# Patient Record
Sex: Male | Born: 1940 | Race: White | Hispanic: No | Smoking: Current every day smoker
Health system: Southern US, Community
[De-identification: ages and names within clinical notes are randomized; demographics above are authoritative.]

## PROBLEM LIST (undated history)

## (undated) DIAGNOSIS — I10 Essential (primary) hypertension: Secondary | ICD-10-CM

## (undated) DIAGNOSIS — F419 Anxiety disorder, unspecified: Secondary | ICD-10-CM

## (undated) DIAGNOSIS — I739 Peripheral vascular disease, unspecified: Secondary | ICD-10-CM

## (undated) DIAGNOSIS — M40209 Unspecified kyphosis, site unspecified: Secondary | ICD-10-CM

## (undated) DIAGNOSIS — W3400XA Accidental discharge from unspecified firearms or gun, initial encounter: Secondary | ICD-10-CM

## (undated) DIAGNOSIS — I219 Acute myocardial infarction, unspecified: Secondary | ICD-10-CM

## (undated) DIAGNOSIS — Z8639 Personal history of other endocrine, nutritional and metabolic disease: Secondary | ICD-10-CM

## (undated) DIAGNOSIS — D699 Hemorrhagic condition, unspecified: Secondary | ICD-10-CM

## (undated) DIAGNOSIS — K224 Dyskinesia of esophagus: Secondary | ICD-10-CM

## (undated) DIAGNOSIS — M549 Dorsalgia, unspecified: Secondary | ICD-10-CM

## (undated) DIAGNOSIS — I4891 Unspecified atrial fibrillation: Secondary | ICD-10-CM

## (undated) DIAGNOSIS — E7800 Pure hypercholesterolemia, unspecified: Secondary | ICD-10-CM

## (undated) DIAGNOSIS — C439 Malignant melanoma of skin, unspecified: Secondary | ICD-10-CM

## (undated) DIAGNOSIS — K219 Gastro-esophageal reflux disease without esophagitis: Secondary | ICD-10-CM

## (undated) DIAGNOSIS — N529 Male erectile dysfunction, unspecified: Secondary | ICD-10-CM

## (undated) DIAGNOSIS — I2510 Atherosclerotic heart disease of native coronary artery without angina pectoris: Secondary | ICD-10-CM

## (undated) DIAGNOSIS — K5730 Diverticulosis of large intestine without perforation or abscess without bleeding: Secondary | ICD-10-CM

## (undated) DIAGNOSIS — E559 Vitamin D deficiency, unspecified: Secondary | ICD-10-CM

## (undated) DIAGNOSIS — H269 Unspecified cataract: Secondary | ICD-10-CM

## (undated) DIAGNOSIS — G8929 Other chronic pain: Secondary | ICD-10-CM

## (undated) DIAGNOSIS — K30 Functional dyspepsia: Secondary | ICD-10-CM

## (undated) DIAGNOSIS — E119 Type 2 diabetes mellitus without complications: Secondary | ICD-10-CM

## (undated) DIAGNOSIS — Z8711 Personal history of peptic ulcer disease: Secondary | ICD-10-CM

## (undated) DIAGNOSIS — N400 Enlarged prostate without lower urinary tract symptoms: Secondary | ICD-10-CM

## (undated) DIAGNOSIS — Z9119 Patient's noncompliance with other medical treatment and regimen: Secondary | ICD-10-CM

## (undated) DIAGNOSIS — D126 Benign neoplasm of colon, unspecified: Secondary | ICD-10-CM

## (undated) DIAGNOSIS — Z8719 Personal history of other diseases of the digestive system: Secondary | ICD-10-CM

## (undated) DIAGNOSIS — D72829 Elevated white blood cell count, unspecified: Secondary | ICD-10-CM

## (undated) DIAGNOSIS — Z8673 Personal history of transient ischemic attack (TIA), and cerebral infarction without residual deficits: Secondary | ICD-10-CM

## (undated) DIAGNOSIS — Z8701 Personal history of pneumonia (recurrent): Secondary | ICD-10-CM

## (undated) DIAGNOSIS — E782 Mixed hyperlipidemia: Secondary | ICD-10-CM

## (undated) DIAGNOSIS — G4730 Sleep apnea, unspecified: Secondary | ICD-10-CM

## (undated) HISTORY — DX: Personal history of transient ischemic attack (TIA), and cerebral infarction without residual deficits: Z86.73

## (undated) HISTORY — DX: Atherosclerotic heart disease of native coronary artery without angina pectoris: I25.10

## (undated) HISTORY — DX: Accidental discharge from unspecified firearms or gun, initial encounter: W34.00XA

## (undated) HISTORY — DX: Elevated white blood cell count, unspecified: D72.829

## (undated) HISTORY — DX: Unspecified atrial fibrillation: I48.91

## (undated) HISTORY — DX: Vitamin D deficiency, unspecified: E55.9

## (undated) HISTORY — DX: Type 2 diabetes mellitus without complications: E11.9

## (undated) HISTORY — DX: Essential (primary) hypertension: I10

## (undated) HISTORY — DX: Personal history of other diseases of the digestive system: Z87.19

## (undated) HISTORY — PX: MELANOMA EXCISION: SHX5266

## (undated) HISTORY — PX: LIVER SURGERY: SHX698

## (undated) HISTORY — DX: Personal history of peptic ulcer disease: Z87.11

## (undated) HISTORY — DX: Mixed hyperlipidemia: E78.2

## (undated) HISTORY — DX: Patient's noncompliance with other medical treatment and regimen: Z91.19

## (undated) HISTORY — DX: Sleep apnea, unspecified: G47.30

## (undated) HISTORY — DX: Anxiety disorder, unspecified: F41.9

## (undated) HISTORY — DX: Personal history of pneumonia (recurrent): Z87.01

## (undated) HISTORY — DX: Peripheral vascular disease, unspecified: I73.9

## (undated) HISTORY — DX: Dyskinesia of esophagus: K22.4

## (undated) HISTORY — DX: Malignant melanoma of skin, unspecified: C43.9

## (undated) HISTORY — DX: Diverticulosis of large intestine without perforation or abscess without bleeding: K57.30

## (undated) HISTORY — DX: Personal history of other endocrine, nutritional and metabolic disease: Z86.39

## (undated) HISTORY — DX: Male erectile dysfunction, unspecified: N52.9

## (undated) HISTORY — PX: SURGERY SCROTAL / TESTICULAR: SUR1316

## (undated) HISTORY — DX: Unspecified cataract: H26.9

## (undated) HISTORY — DX: Gastro-esophageal reflux disease without esophagitis: K21.9

## (undated) HISTORY — DX: Other chronic pain: G89.29

## (undated) HISTORY — PX: ANGIOPLASTY: SHX39

## (undated) HISTORY — DX: Functional dyspepsia: K30

## (undated) HISTORY — DX: Benign neoplasm of colon, unspecified: D12.6

---

## 1972-07-19 HISTORY — PX: SPLENECTOMY: SUR1306

## 1977-07-19 DIAGNOSIS — W3400XA Accidental discharge from unspecified firearms or gun, initial encounter: Secondary | ICD-10-CM

## 1977-07-19 HISTORY — DX: Accidental discharge from unspecified firearms or gun, initial encounter: W34.00XA

## 1990-07-19 HISTORY — PX: OTHER SURGICAL HISTORY: SHX169

## 1998-07-19 DIAGNOSIS — I219 Acute myocardial infarction, unspecified: Secondary | ICD-10-CM

## 1998-07-19 HISTORY — DX: Acute myocardial infarction, unspecified: I21.9

## 1998-07-19 HISTORY — PX: CORONARY ARTERY BYPASS GRAFT: SHX141

## 1998-07-25 ENCOUNTER — Ambulatory Visit (HOSPITAL_COMMUNITY): Admission: RE | Admit: 1998-07-25 | Discharge: 1998-07-25 | Payer: Self-pay | Admitting: Cardiology

## 1998-07-31 ENCOUNTER — Inpatient Hospital Stay (HOSPITAL_COMMUNITY)
Admission: RE | Admit: 1998-07-31 | Discharge: 1998-08-06 | Payer: Self-pay | Admitting: Thoracic Surgery (Cardiothoracic Vascular Surgery)

## 1998-07-31 ENCOUNTER — Encounter: Payer: Self-pay | Admitting: Thoracic Surgery (Cardiothoracic Vascular Surgery)

## 1998-08-01 ENCOUNTER — Encounter: Payer: Self-pay | Admitting: Thoracic Surgery (Cardiothoracic Vascular Surgery)

## 1998-08-02 ENCOUNTER — Encounter: Payer: Self-pay | Admitting: Thoracic Surgery (Cardiothoracic Vascular Surgery)

## 1998-08-04 ENCOUNTER — Encounter: Payer: Self-pay | Admitting: Cardiothoracic Surgery

## 1998-08-06 ENCOUNTER — Encounter: Payer: Self-pay | Admitting: Thoracic Surgery (Cardiothoracic Vascular Surgery)

## 1999-01-19 ENCOUNTER — Other Ambulatory Visit: Admission: RE | Admit: 1999-01-19 | Discharge: 1999-01-19 | Payer: Self-pay | Admitting: Gastroenterology

## 1999-01-19 ENCOUNTER — Encounter (INDEPENDENT_AMBULATORY_CARE_PROVIDER_SITE_OTHER): Payer: Self-pay | Admitting: Specialist

## 1999-10-06 ENCOUNTER — Ambulatory Visit (HOSPITAL_COMMUNITY): Admission: RE | Admit: 1999-10-06 | Discharge: 1999-10-06 | Payer: Self-pay | Admitting: Cardiology

## 2001-05-19 ENCOUNTER — Emergency Department (HOSPITAL_COMMUNITY): Admission: EM | Admit: 2001-05-19 | Discharge: 2001-05-19 | Payer: Self-pay | Admitting: Emergency Medicine

## 2002-07-19 HISTORY — PX: CHOLECYSTECTOMY OPEN: SUR202

## 2002-09-03 ENCOUNTER — Encounter: Payer: Self-pay | Admitting: Surgery

## 2002-09-06 ENCOUNTER — Encounter: Payer: Self-pay | Admitting: Surgery

## 2002-09-06 ENCOUNTER — Encounter (INDEPENDENT_AMBULATORY_CARE_PROVIDER_SITE_OTHER): Payer: Self-pay

## 2002-09-06 ENCOUNTER — Inpatient Hospital Stay (HOSPITAL_COMMUNITY): Admission: RE | Admit: 2002-09-06 | Discharge: 2002-09-08 | Payer: Self-pay | Admitting: Surgery

## 2002-11-16 ENCOUNTER — Encounter: Admission: RE | Admit: 2002-11-16 | Discharge: 2002-11-16 | Payer: Self-pay | Admitting: Family Medicine

## 2002-11-16 ENCOUNTER — Encounter: Payer: Self-pay | Admitting: Family Medicine

## 2002-12-05 ENCOUNTER — Encounter: Payer: Self-pay | Admitting: Family Medicine

## 2002-12-05 ENCOUNTER — Encounter: Admission: RE | Admit: 2002-12-05 | Discharge: 2002-12-05 | Payer: Self-pay | Admitting: Family Medicine

## 2003-05-21 ENCOUNTER — Ambulatory Visit (HOSPITAL_COMMUNITY): Admission: RE | Admit: 2003-05-21 | Discharge: 2003-05-21 | Payer: Self-pay | Admitting: Cardiology

## 2004-01-24 ENCOUNTER — Encounter: Admission: RE | Admit: 2004-01-24 | Discharge: 2004-01-24 | Payer: Self-pay | Admitting: Obstetrics and Gynecology

## 2005-05-21 ENCOUNTER — Encounter: Admission: RE | Admit: 2005-05-21 | Discharge: 2005-05-21 | Payer: Self-pay | Admitting: Family Medicine

## 2005-09-22 ENCOUNTER — Ambulatory Visit: Payer: Self-pay | Admitting: Gastroenterology

## 2005-10-06 ENCOUNTER — Ambulatory Visit: Payer: Self-pay | Admitting: Gastroenterology

## 2005-10-14 ENCOUNTER — Encounter: Admission: RE | Admit: 2005-10-14 | Discharge: 2005-10-14 | Payer: Self-pay | Admitting: Cardiology

## 2005-12-20 ENCOUNTER — Inpatient Hospital Stay (HOSPITAL_COMMUNITY): Admission: EM | Admit: 2005-12-20 | Discharge: 2005-12-21 | Payer: Self-pay | Admitting: Emergency Medicine

## 2006-02-10 ENCOUNTER — Emergency Department (HOSPITAL_COMMUNITY): Admission: EM | Admit: 2006-02-10 | Discharge: 2006-02-10 | Payer: Self-pay | Admitting: Emergency Medicine

## 2006-06-02 ENCOUNTER — Encounter: Admission: RE | Admit: 2006-06-02 | Discharge: 2006-06-02 | Payer: Self-pay | Admitting: Specialist

## 2006-07-06 ENCOUNTER — Encounter: Admission: RE | Admit: 2006-07-06 | Discharge: 2006-07-06 | Payer: Self-pay | Admitting: Specialist

## 2006-12-02 ENCOUNTER — Emergency Department (HOSPITAL_COMMUNITY): Admission: EM | Admit: 2006-12-02 | Discharge: 2006-12-03 | Payer: Self-pay | Admitting: Emergency Medicine

## 2007-02-20 ENCOUNTER — Encounter: Admission: RE | Admit: 2007-02-20 | Discharge: 2007-02-20 | Payer: Self-pay | Admitting: Specialist

## 2007-10-13 ENCOUNTER — Ambulatory Visit: Payer: Self-pay | Admitting: Vascular Surgery

## 2008-05-24 ENCOUNTER — Ambulatory Visit (HOSPITAL_COMMUNITY): Payer: Self-pay | Admitting: Psychiatry

## 2008-07-19 LAB — HM DIABETES EYE EXAM

## 2008-07-29 ENCOUNTER — Ambulatory Visit (HOSPITAL_COMMUNITY): Payer: Self-pay | Admitting: Psychiatry

## 2008-09-25 ENCOUNTER — Ambulatory Visit (HOSPITAL_COMMUNITY): Payer: Self-pay | Admitting: Psychiatry

## 2009-01-01 ENCOUNTER — Ambulatory Visit (HOSPITAL_COMMUNITY): Payer: Self-pay | Admitting: Psychiatry

## 2010-05-05 ENCOUNTER — Encounter: Admission: RE | Admit: 2010-05-05 | Discharge: 2010-05-05 | Payer: Self-pay | Admitting: Family Medicine

## 2010-08-09 ENCOUNTER — Encounter: Payer: Self-pay | Admitting: Specialist

## 2010-08-24 LAB — HM DIABETES FOOT EXAM

## 2010-10-04 ENCOUNTER — Encounter: Payer: Self-pay | Admitting: *Deleted

## 2010-10-04 DIAGNOSIS — C439 Malignant melanoma of skin, unspecified: Secondary | ICD-10-CM

## 2010-10-04 DIAGNOSIS — K432 Incisional hernia without obstruction or gangrene: Secondary | ICD-10-CM

## 2010-10-04 DIAGNOSIS — I2510 Atherosclerotic heart disease of native coronary artery without angina pectoris: Secondary | ICD-10-CM

## 2010-10-04 DIAGNOSIS — G4730 Sleep apnea, unspecified: Secondary | ICD-10-CM

## 2010-10-04 DIAGNOSIS — R42 Dizziness and giddiness: Secondary | ICD-10-CM

## 2010-10-04 DIAGNOSIS — E1151 Type 2 diabetes mellitus with diabetic peripheral angiopathy without gangrene: Secondary | ICD-10-CM | POA: Insufficient documentation

## 2010-10-04 DIAGNOSIS — N4889 Other specified disorders of penis: Secondary | ICD-10-CM

## 2010-10-04 DIAGNOSIS — F17200 Nicotine dependence, unspecified, uncomplicated: Secondary | ICD-10-CM

## 2010-10-04 DIAGNOSIS — I10 Essential (primary) hypertension: Secondary | ICD-10-CM

## 2010-10-04 DIAGNOSIS — M47817 Spondylosis without myelopathy or radiculopathy, lumbosacral region: Secondary | ICD-10-CM

## 2010-10-04 DIAGNOSIS — B9681 Helicobacter pylori [H. pylori] as the cause of diseases classified elsewhere: Secondary | ICD-10-CM

## 2010-10-04 DIAGNOSIS — D126 Benign neoplasm of colon, unspecified: Secondary | ICD-10-CM

## 2010-10-04 DIAGNOSIS — N529 Male erectile dysfunction, unspecified: Secondary | ICD-10-CM

## 2010-10-04 DIAGNOSIS — E291 Testicular hypofunction: Secondary | ICD-10-CM

## 2010-10-04 DIAGNOSIS — K5730 Diverticulosis of large intestine without perforation or abscess without bleeding: Secondary | ICD-10-CM

## 2010-10-04 DIAGNOSIS — R51 Headache: Secondary | ICD-10-CM

## 2010-10-04 DIAGNOSIS — I739 Peripheral vascular disease, unspecified: Secondary | ICD-10-CM

## 2010-10-04 DIAGNOSIS — M47812 Spondylosis without myelopathy or radiculopathy, cervical region: Secondary | ICD-10-CM
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
## 2010-12-04 NOTE — Discharge Summary (Signed)
NAME:  Dustin Dominguez, Dustin Dominguez                       ACCOUNT NO.:  [id number]   MEDICAL RECORD NO.:  [id number]                   PATIENT TYPE:  INP   LOCATION:  5733                                 FACILITY:  [facility]   PHYSICIAN:  Sandria Bales. Ezzard Standing, M.D.               DATE OF BIRTH:  Jan 31, 1941   DATE OF ADMISSION:  09/06/2002  DATE OF DISCHARGE:  09/08/2002                                 DISCHARGE SUMMARY   DISCHARGE DIAGNOSES:  1. Chronic cholecystitis with cholelithiasis.  2. Coronary artery disease, followed by Dr. Delfin Edis.  3. Sleep apnea but the patient has refused CPAP.  4. Heavy smoking.  5. Type 2 diabetes mellitus.  6. Obesity.  7. Multiple prior abdominal operations.   OPERATION:  Open cholecystectomy and intraoperative cholangiogram and lysis  of adhesions on September 06, 2002.   HISTORY OF PRESENT ILLNESS:  The patient is a 70 year old male who has  multiple medical problems including multiple prior abdominal operations. He  presented with a diagnosis of gallstones and was seen by Dr. Sheryn Bison. He has undergone both an upper endoscopy and colonoscopy in  October 2003. The patient has his symptoms about once a month that occur  probably for 4 to 5 hours in which he points to his mid epigastrium and  periumbilical area. He was worried at one time about having ulcer disease,  but there is no evidence of ulcer disease on upper endoscopy, and though  his symptoms are not typical, it is felt to be possible due to gallbladder  disease. Again the patient is obese and has significant coronary artery  disease, sleep apnea and type 2 diabetes mellitus.   HOSPITAL COURSE:  The patient is brought to the hospital after a full  discussion and indications of potential complications of the operation.  Because of his prior multiple abdominal surgeries, it is thought to be very  unlikely that his surgery would be able to be done laparoscopically.   The patient was taken to  the operating room where I attempted a laparoscopic  but quickly converted to an open cholecystectomy. He also had a lot of  adhesions around his liver and right upper quadrant from his prior right  subcostal and adrenal surgery. However, the surgery went fairly well.   On postoperative day #1 he had minimal complaints of pain. He did have a low  grade temperature of 100.7, felt to be primary due to atelectasis. His blood  pressure was stable at 146/60, but I restarted his antihypertensive  medications. His diabetes was under good control with his blood sugars at  135. We had an epidural in him to help him with his breathing which was  removed.   On postoperative day #2, he was doing well and ready for discharge. His  final pathology revealed chronic cholecystitis with cholelithiasis. His  discharge condition was good.   DISCHARGE MEDICATIONS:  1. Percocet for  pain.  2. He was to resume his home medications he was on before coming to the     hospital.   DISCHARGE INSTRUCTIONS:  He was to do no driving for 3 to 4 days,  no heavy  lifting for 3 weeks. He was to be on a low fat diabetic diet. He could  shower.   FOLLOW UP:  He was to see me in 7 to 10 days for follow up and possible  staple removal.                                               Sandria Bales. Ezzard Standing, M.D.    DHN/MEDQ  D:  10/02/2002  T:  10/03/2002  Job:  [id number]   cc:   Ernestina Penna, M.D.  [address] [place]  [place] [address]  Fax: [phone]   Colleen Can. Deborah Chalk, M.D.  [address], [address]  [place]  [place] [address]  Fax: [phone]   Vania Rea. Jarold Motto, M.D. [facility]

## 2010-12-04 NOTE — Cardiovascular Report (Signed)
[place]  Patient:    Dustin Dominguez, Dustin Dominguez                    MRN: [id number] Proc. Date: 10/06/99 Adm. Date:  [id number] Attending:  Eleanora Neighbor                        Cardiac Catheterization  HISTORY:  Dustin Dominguez had previous coronary artery bypass grafting in January 2000 and presents with recurrent anginal-like chest pain.  He is referred for catheterization.  PROCEDURE:  Left heart catheterization with selective coronary angiography, left ventricular angiography with saphenous vein graft angiography and angiography of the left internal mammary artery.  TYPE AND SITE OF ENTRY:  Percutaneous right femoral artery (no Perclose).  CONTRAST MATERIAL:  Omnipaque.  MEDICATIONS GIVEN PRIOR TO PROCEDURE:  Valium 10 mg p.o.  MEDICATIONS GIVEN DURING THE PROCEDURE:  Versed 2 mg IV.  CATHETERS:  A 6-French four-curved Judkins right and left coronary catheter, 6-French pigtail ventriculographic catheter.  COMMENTS:  The patient tolerated the procedure well.  HEMODYNAMIC DATA:  Aortic pressure was 129/72.  LV was 128/20.  There was no aortic valve gradient noted on pullback.  ANGIOGRAPHIC DATA:  The left ventricular angiogram was performed in the RAO position.  Overall cardiac size and silhouette are normal.  Global ejection fraction would be 60%.  There s no abnormal wall motion.  There is no mitral regurgitation, intracardiac calcification, or intracavitary filling defects.  1. The left internal mammary artery has nice graft and inserts into the left    anterior descending.  Distal to the insertion site, there is a 60% to perhaps    70% narrowing as the vessel crosses the apex, and there is diffuse disease    distally; however, the runoff is a relatively small area. 2. The saphenous vein graft to the obtuse marginal #1 and obtuse marginal #2 is    widely patent with a nice insertion site.  Good distal runoff.  There is  somewhat diffuse disease both in the proximal segment of the first obtuse    marginal and in the proximal section of the second obtuse marginal, but it does    not appear to be obstructive in nature. 3. The saphenous vein graft to the diagonal vessel is widely patent with a nice    insertion and good distal runoff. 4. The right coronary artery is a small system.  It remains the dominant vessel  but is small and free of significant disease. 5. The left coronary system    a. The left main coronary artery tapers approximately 50%.    b. The left circumflex is totally occluded and fills by bypass grafts.  It is       diffusely diseased throughout its proximal section.    c. The left anterior descending artery is totally occluded at the level of the       diagonal vessel.  OVERALL IMPRESSION: 1. Normal left ventricular function. 2. Totally occluded left circumflex and left anterior descending arteries with o    significant disease in a small right coronary artery. 3. Persistent patency of the left internal mammary artery graft to the LAD,    saphenous vein graft to the diagonal, and saphenous vein graft to the obtuse  marginal #1 and obtuse marginal #2.  DISCUSSION:  Basically, it would appear that the Techno grafts placed in Dustin Dominguez operation are quite satisfactory.  There is some  distal disease in the  left anterior descending artery, and then there is scattered distal disease in he retrograde filling of occluded coronaries off of the left system which have potential for ischemia but are clearly not bypassable.  We will try to modify Dustin Dominguez cardiovascular risk factors. DD:  10/06/99 TD:  10/06/99 Job: [id number]

## 2010-12-04 NOTE — Consult Note (Signed)
[facility]  Patient:    Dustin Dominguez Visit Number: [id number] MRN: [id number]          Service Type: EMS Location: ED Attending Physician:  Dustin Dominguez Dictated by:   Dustin Dominguez, M.D. Proc. Date: 05/19/01 Admit Date:  05/19/2001 Discharge Date: 05/19/2001   CC:         Dustin Dominguez, M.D., [place], [place]   Consultation Report  CHIEF COMPLAINT:  Right scrotal pain.  HISTORY:  This is a 70 year old white male who is seen today at the request of Dr. Monica Dominguez, who sent him down with question of whether he had an incarcerated hernia or orchitis.  Patient presented with a three-day history of increasing right scrotal pain.  He denies any nausea or vomiting.  He has previously had a sliding left inguinal hernia, which he has had for some time. He has recently had an upper respiratory tract infection and has had some coughing.  PHYSICAL EXAMINATION:  GU:  His right testicle seems somewhat enlarged and exquisitely tender to palpation.  Above that, I can palpate the cord structures and they are, although tender, not bulging and not distended.  With cough and Valsalva, I do not feel any bulge in the scrotum or in the inguinal canal.  Palpating the internal ring from above, it feels normal.  Therefore, this is consistent with an orchitis.  I discussed this with him.  He does not have a urologist.  I will go ahead and start him on ciprofloxacin 500 mg p.o. b.i.d. and plan to treat him for 10 days.  Also, I will give him something for pain.  IMPRESSION:  Orchitis, right testicle.  PLAN:  Patient will call our office next week for scheduled followup.  Plan antibiotic treatment with Percocet for pain. Dictated by:   Dustin Dominguez, M.D. Attending Physician:  Dustin Dominguez DD:  05/19/01 TD:  05/21/01 Job: [id number]

## 2010-12-04 NOTE — Op Note (Signed)
   NAME:  Dustin Dominguez, Dustin Dominguez                       ACCOUNT NO.:  [id number]   MEDICAL RECORD NO.:  [id number]                   PATIENT TYPE:  OIB   LOCATION:  2550                                 FACILITY:  [facility]   PHYSICIAN:  Quita Skye. Krista Blue, M.D.               DATE OF BIRTH:  03/28/1941   DATE OF PROCEDURE:  09/06/2002  DATE OF DISCHARGE:                                 OPERATIVE REPORT   PROCEDURE:  Epidural catheter placement.   INDICATIONS FOR PROCEDURE:  The patient is a 70 year old white male with  coronary artery disease, obesity, and severe sleep apnea who presents to the  operating room for laparoscopic cholecystectomy versus open cholecystectomy.  I discussed the patient's postoperative pain control options if the surgery  would go to an open cholecystectomy.  He understood the risks and benefits  of an epidural catheter, initially declining this and after further thought  accepting the risks and benefits and wished to have the epidural catheter  placed should the surgery be open.  I discussed this with Dr. Ezzard Standing and he  agreed to the procedure.   DESCRIPTION OF PROCEDURE:  Following the patient's surgery, the patient was  placed in the right lateral decubitus position.  The lower thoracic spine  was sterilely prepped and draped.  A 17-gauge Tuohy needle was advanced  initially midline unsuccessfully and then paramedian successfully at about  the T10-T11 interspace.  The epidural needle did not aspirate blood or CSF  and epidural catheter was inserted 4 cm into the epidural space.  The needle  was removed and the catheter was secured to the patient's back.  The  catheter did not aspirate blood or CSF and a 5-mL test dose of 1% lidocaine  with epinephrine was negative.  Following a negative test dose, 2 mL of  fentanyl and eight more mL's of lidocaine were given.  This was done  incrementally and the patient was then returned supine, extubated, brought  to the PACU where  he was placed on a Marcaine/fentanyl infusion.  The  patient will be followed by the acute pain service until the catheter is  removed.                                               Quita Skye Krista Blue, M.D.    JDS/MEDQ  D:  09/06/2002  T:  09/06/2002  Job:  [id number]

## 2010-12-04 NOTE — Cardiovascular Report (Signed)
NAME:  Dustin Dominguez, Dustin Dominguez                       ACCOUNT NO.:  [id number]   MEDICAL RECORD NO.:  [id number]                   PATIENT TYPE:  OIB   LOCATION:  2857                                 FACILITY:  [facility]   PHYSICIAN:  Colleen Can. Deborah Chalk, M.D.            DATE OF BIRTH:  11-Nov-1940   DATE OF PROCEDURE:  05/21/2003  DATE OF DISCHARGE:                              CARDIAC CATHETERIZATION   HISTORY:  Dustin Dominguez had previous coronary artery bypass grafting.  He  presents with progressive substernal chest discomfort and is referred for  catheterization.   PROCEDURE:  Left heart catheterization with selective coronary angiography,  left ventricular angiography, saphenous vein graft angiography x2, and  angiography of the left internal mammary artery.   TYPE AND SITE OF ENTRY:  Percutaneous, right femoral artery.   CATHETERS:  6-French 4-curved Judkins right and left coronary catheters; 6-  French pigtail ventriculographic catheter.   CONTRAST MATERIAL:  Omnipaque.   MEDICATIONS GIVEN PRIOR TO THE PROCEDURE:  Valium 10 mg p.o., Percocet.   MEDICATIONS GIVEN DURING THE PROCEDURE:  Fentanyl 50 mcg IV.   COMMENTS:  The patient tolerated the procedure well.   HEMODYNAMIC DATA:  1. The aortic pressure was 164/77.  2. LV was 173/21.  3. There was no aortic valve gradient noted on pullback.   ANGIOGRAPHIC DATA:  1. Left main coronary artery had 30-50% distal narrowing.  2. Left circumflex had severe diffuse disease in its proximal portions.     There was bidirectional flow in both of the marginal vessels.  There was     a smaller continuation branch in the AV groove.  3. Left anterior descending had diffuse disease proximally.  Just prior to a     larger second diagonal vessel there was an 80% stenosis.  A first     diagonal vessel was free of significant obstructive disease.  In the left     anterior descending distal to the second diagonal there was a 90%     stenosis but  there was bidirectional flow distally from the left internal     mammary graft.  4. Right coronary artery is a small, dominant vessel.  It has     irregularities, but no significant focal obstructive disease.  5. Saphenous vein graft to the diagonal vessel is widely patent with a nice     insertion and good distal runoff.  6. Saphenous vein graft to obtuse marginal 1 and obtuse marginal 2 in a     sequential manner is widely patent with a nice insertion and good distal     runoff.  7. Left internal mammary artery graft to the LAD is widely patent with a     nice insertion and good distal runoff.  The insertion site is in the more     distal portions of the left anterior descending and there is some degree     of retrograde filling  of the left anterior descending in a bidirectional     manner.   LEFT VENTRICULOGRAM:  Left ventricular angiogram was performed in the RAO  position.  Overall cardiac size and silhouette are normal.  The global  ejection fraction is 50-55%.  There is mild anterior hypokinesia.   ABDOMINAL AORTOGRAM:  The abdominal aortogram demonstrates patent renal  arteries bilaterally.   OVERALL IMPRESSION:  1. Reasonably well preserved global left ventricular function with mild     anterior hypokinesia.  2. Severe stenosis in the left anterior descending and left circumflex with     mild to minimal disease in the right coronary artery.  3. Patent left internal mammary artery graft to distal left anterior     descending, patent saphenous vein graft to the second diagonal, patent     sequential saphenous vein graft to first and second obtuse marginal     vessels.   DISCUSSION:  It is felt that Dustin Dominguez can be managed medically at this  point in time.  Perclose was not performed because the insertion site was  just distal to the bifurcation of the common femoral.                                               Colleen Can. Deborah Chalk, M.D.    SNT/MEDQ  D:  05/21/2003   T:  05/21/2003  Job:  [id number]
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
## 2011-01-27 ENCOUNTER — Telehealth: Payer: Self-pay | Admitting: Cardiology

## 2011-01-27 ENCOUNTER — Encounter: Payer: Self-pay | Admitting: Cardiology

## 2011-01-27 ENCOUNTER — Ambulatory Visit (INDEPENDENT_AMBULATORY_CARE_PROVIDER_SITE_OTHER): Payer: BC Managed Care – PPO | Admitting: Cardiology

## 2011-01-27 VITALS — BP 108/66 | HR 69 | Ht 72.0 in | Wt 224.0 lb

## 2011-01-27 DIAGNOSIS — I10 Essential (primary) hypertension: Secondary | ICD-10-CM

## 2011-01-27 DIAGNOSIS — F17200 Nicotine dependence, unspecified, uncomplicated: Secondary | ICD-10-CM

## 2011-01-27 DIAGNOSIS — I2510 Atherosclerotic heart disease of native coronary artery without angina pectoris: Secondary | ICD-10-CM

## 2011-01-27 DIAGNOSIS — E782 Mixed hyperlipidemia: Secondary | ICD-10-CM

## 2011-01-27 DIAGNOSIS — E785 Hyperlipidemia, unspecified: Secondary | ICD-10-CM | POA: Insufficient documentation

## 2011-01-27 NOTE — Telephone Encounter (Deleted)
[phone] STRESS, NUC from 2011

## 2011-01-27 NOTE — Assessment & Plan Note (Signed)
Will request most recent lipid panel from Dr. Christell Constant for review.

## 2011-01-27 NOTE — Assessment & Plan Note (Signed)
Patient states that he has had some difficulties with fluctuating blood pressures and significant hypertension over the last several months, although medication adjustments have reportedly been made, and his blood pressure is quite well controlled today.

## 2011-01-27 NOTE — Patient Instructions (Signed)
Your physician wants you to follow-up in: 6 months. You will receive a reminder letter in the mail one-two months in advance. If you don't receive a letter, please call our office to schedule the follow-up appointment. Your physician recommends that you continue on your current medications as directed. Please refer to the Current Medication list given to you today. Your physician discussed the hazards of tobacco use. Tobacco use cessation is recommended and techniques and options to help you quit were discussed. We have requested your recent cholesterol labs from Dr. Christell Constant. We will notify you if any changes are needed after review by Dr. Diona Browner.
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
## 2011-01-27 NOTE — Assessment & Plan Note (Signed)
Symptomatically stable on medical therapy status post prior coronary artery bypass grafting. Myoview from last year showed good myocardial perfusion with normal LVEF. ECG is stable. At this point plan to continue medical therapy and observation.

## 2011-01-27 NOTE — Assessment & Plan Note (Signed)
Long-standing history. Smoking cessation recommended.

## 2011-02-15 NOTE — Telephone Encounter (Signed)
No note required for this encounter.

## 2011-05-10 ENCOUNTER — Ambulatory Visit: Payer: BC Managed Care – PPO | Admitting: Physical Therapy

## 2011-05-12 ENCOUNTER — Ambulatory Visit: Payer: Medicare Other | Attending: Family Medicine | Admitting: Physical Therapy

## 2011-05-12 DIAGNOSIS — M545 Low back pain, unspecified: Secondary | ICD-10-CM | POA: Insufficient documentation

## 2011-05-12 DIAGNOSIS — R5381 Other malaise: Secondary | ICD-10-CM | POA: Insufficient documentation

## 2011-05-12 DIAGNOSIS — R293 Abnormal posture: Secondary | ICD-10-CM | POA: Insufficient documentation

## 2011-05-12 DIAGNOSIS — IMO0001 Reserved for inherently not codable concepts without codable children: Secondary | ICD-10-CM | POA: Insufficient documentation

## 2011-05-17 ENCOUNTER — Ambulatory Visit (AMBULATORY_SURGERY_CENTER): Payer: Medicare Other | Admitting: *Deleted

## 2011-05-17 VITALS — Ht 72.0 in | Wt 229.6 lb

## 2011-05-17 DIAGNOSIS — Z86010 Personal history of colonic polyps: Secondary | ICD-10-CM

## 2011-05-17 MED ORDER — PEG-KCL-NACL-NASULF-NA ASC-C 100 G PO SOLR: ORAL | Status: DC

## 2011-05-17 NOTE — Progress Notes (Signed)
PATIENT STATES HE HAD NO PROBLEMS WITH SEDATION DURING LAST COLONOSCOPY IN 2007, HE STATES HE WAS ON PAIN MEDS & ANXIETY MEDS ALSO AT THAT TIME AND HE HAD SLEEP APNEA FOR THE LAST 15 YEARS.

## 2011-05-18 ENCOUNTER — Ambulatory Visit: Payer: Medicare Other | Admitting: *Deleted

## 2011-05-20 ENCOUNTER — Encounter: Payer: BC Managed Care – PPO | Admitting: *Deleted

## 2011-05-31 ENCOUNTER — Other Ambulatory Visit: Payer: BC Managed Care – PPO | Admitting: Gastroenterology

## 2011-06-18 ENCOUNTER — Other Ambulatory Visit: Payer: Self-pay | Admitting: Gastroenterology

## 2011-09-15 ENCOUNTER — Ambulatory Visit: Payer: Medicare Other | Admitting: Cardiology

## 2011-10-21 ENCOUNTER — Ambulatory Visit (INDEPENDENT_AMBULATORY_CARE_PROVIDER_SITE_OTHER): Payer: Medicare Other | Admitting: Cardiology

## 2011-10-21 ENCOUNTER — Telehealth: Payer: Self-pay | Admitting: *Deleted

## 2011-10-21 ENCOUNTER — Encounter: Payer: Self-pay | Admitting: *Deleted

## 2011-10-21 ENCOUNTER — Encounter: Payer: Self-pay | Admitting: Cardiology

## 2011-10-21 VITALS — BP 145/81 | HR 60 | Ht 72.0 in | Wt 218.0 lb

## 2011-10-21 DIAGNOSIS — I2510 Atherosclerotic heart disease of native coronary artery without angina pectoris: Secondary | ICD-10-CM

## 2011-10-21 DIAGNOSIS — R072 Precordial pain: Secondary | ICD-10-CM

## 2011-10-21 DIAGNOSIS — F17200 Nicotine dependence, unspecified, uncomplicated: Secondary | ICD-10-CM

## 2011-10-21 DIAGNOSIS — E782 Mixed hyperlipidemia: Secondary | ICD-10-CM

## 2011-10-21 DIAGNOSIS — I10 Essential (primary) hypertension: Secondary | ICD-10-CM

## 2011-10-21 MED ORDER — ISOSORBIDE MONONITRATE ER 30 MG PO TB24: 30.0000 mg | ORAL_TABLET | Freq: Every day | ORAL | Status: DC

## 2011-10-21 NOTE — Assessment & Plan Note (Signed)
Continue current regimen

## 2011-10-21 NOTE — Telephone Encounter (Signed)
[employer] Auth # [id number] exp 11/19/11 [employer] does not require precert per this patient plan. Copy of this is scanned into the patient's documents/release of information.

## 2011-10-21 NOTE — Assessment & Plan Note (Signed)
Smoking cessation discussed. Patient has not been able to quit over time.
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
## 2011-10-21 NOTE — Patient Instructions (Addendum)
Your physician wants you to follow-up in: 6 months. You will receive a reminder letter in the mail one-two months in advance. If you don't receive a letter, please call our office to schedule the follow-up appointment. Start Imdur (isosorbide) 30 mg daily. Your physician has requested that you have a lexiscan myoview. For further information please visit [email or web address] Please follow instruction sheet, as given.  If the results of your test are normal or stable, you will receive a letter. If they are abnormal, the nurse will contact you by phone.

## 2011-10-21 NOTE — Assessment & Plan Note (Signed)
Multivessel disease status post previous CABG in 2000. He reports progressive chest pain symptoms in the last 6 months. Followup ECG is nonspecific. Plan is to add Imdur 30 mg daily to current regimen and schedule a followup Lexiscan Myoview for ischemic assessment. If this is low risk, and he has symptom improvement with medication adjustments, we will continue observation. Otherwise plan to bring him back sooner and discussed further evaluation.

## 2011-10-21 NOTE — Telephone Encounter (Signed)
lexiscan myoview scheduled for 10-25-2011 @ [facility] Checking percert

## 2011-10-21 NOTE — Assessment & Plan Note (Signed)
Keep followup with Dr. Moore. 

## 2011-11-08 ENCOUNTER — Encounter: Payer: Self-pay | Admitting: Gastroenterology

## 2011-11-22 ENCOUNTER — Encounter: Payer: Self-pay | Admitting: *Deleted

## 2011-11-24 ENCOUNTER — Ambulatory Visit (AMBULATORY_SURGERY_CENTER): Payer: Medicare Other | Admitting: *Deleted

## 2011-11-24 VITALS — Ht 72.0 in | Wt 213.7 lb

## 2011-11-24 DIAGNOSIS — Z1211 Encounter for screening for malignant neoplasm of colon: Secondary | ICD-10-CM

## 2011-12-08 ENCOUNTER — Encounter: Payer: Self-pay | Admitting: Gastroenterology

## 2011-12-08 ENCOUNTER — Ambulatory Visit (AMBULATORY_SURGERY_CENTER): Payer: Medicare Other | Admitting: Gastroenterology

## 2011-12-08 VITALS — BP 126/65 | HR 58 | Temp 97.6°F | Resp 20 | Ht 72.0 in | Wt 213.0 lb

## 2011-12-08 DIAGNOSIS — Z1211 Encounter for screening for malignant neoplasm of colon: Secondary | ICD-10-CM

## 2011-12-08 DIAGNOSIS — K5730 Diverticulosis of large intestine without perforation or abscess without bleeding: Secondary | ICD-10-CM

## 2011-12-08 DIAGNOSIS — D126 Benign neoplasm of colon, unspecified: Secondary | ICD-10-CM

## 2011-12-08 LAB — GLUCOSE, CAPILLARY
Glucose-Capillary: 104 mg/dL — ABNORMAL HIGH (ref 70–99)
Glucose-Capillary: 70 mg/dL (ref 70–99)
Glucose-Capillary: 94 mg/dL (ref 70–99)

## 2011-12-08 MED ORDER — SODIUM CHLORIDE 0.9 % IV SOLN: 500.0000 mL | INTRAVENOUS | Status: DC

## 2011-12-08 NOTE — Patient Instructions (Signed)
YOU HAD AN ENDOSCOPIC PROCEDURE TODAY AT THE [facility]: Refer to the procedure report that was given to you for any specific questions about what was found during the examination.  If the procedure report does not answer your questions, please call your gastroenterologist to clarify.  If you requested that your care partner not be given the details of your procedure findings, then the procedure report has been included in a sealed envelope for you to review at your convenience later.  YOU SHOULD EXPECT: Some feelings of bloating in the abdomen. Passage of more gas than usual.  Walking can help get rid of the air that was put into your GI tract during the procedure and reduce the bloating. If you had a lower endoscopy (such as a colonoscopy or flexible sigmoidoscopy) you may notice spotting of blood in your stool or on the toilet paper. If you underwent a bowel prep for your procedure, then you may not have a normal bowel movement for a few days.  DIET: Your first meal following the procedure should be a light meal and then it is ok to progress to your normal diet.  A half-sandwich or bowl of soup is an example of a good first meal.  Heavy or fried foods are harder to digest and may make you feel nauseous or bloated.  Likewise meals heavy in dairy and vegetables can cause extra gas to form and this can also increase the bloating.  Drink plenty of fluids but you should avoid alcoholic beverages for 24 hours.  ACTIVITY: Your care partner should take you home directly after the procedure.  You should plan to take it easy, moving slowly for the rest of the day.  You can resume normal activity the day after the procedure however you should NOT DRIVE or use heavy machinery for 24 hours (because of the sedation medicines used during the test).    SYMPTOMS TO REPORT IMMEDIATELY: A gastroenterologist can be reached at any hour.  During normal business hours, 8:30 AM to 5:00 PM Monday through Friday,  call [phone].  After hours and on weekends, please call the GI answering service at [phone] who will take a message and have the physician on call contact you.   Following lower endoscopy (colonoscopy or flexible sigmoidoscopy):  Excessive amounts of blood in the stool  Significant tenderness or worsening of abdominal pains  Swelling of the abdomen that is new, acute  Fever of 100F or higher   FOLLOW UP: If any biopsies were taken you will be contacted by phone or by letter within the next 1-3 weeks.  Call your gastroenterologist if you have not heard about the biopsies in 3 weeks.  Our staff will call the home number listed on your records the next business day following your procedure to check on you and address any questions or concerns that you may have at that time regarding the information given to you following your procedure. This is a courtesy call and so if there is no answer at the home number and we have not heard from you through the emergency physician on call, we will assume that you have returned to your regular daily activities without incident.   Resume medications.metamucil or benefiber.Information given on diverticulosis and high fiber diet,polyps with discharge instructions. SIGNATURES/CONFIDENTIALITY: You and/or your care partner have signed paperwork which will be entered into your electronic medical record.  These signatures attest to the fact that that the information above on your After  Visit Summary has been reviewed and is understood.  Full responsibility of the confidentiality of this discharge information lies with you and/or your care-partner.

## 2011-12-08 NOTE — Progress Notes (Signed)
Blood sugar repeat CBG 70, 4 oz apple juice given in addition to 3 graham cracker halves and peanut butter. IVF changed to D5W, will recheck CBG in 15 minutes.

## 2011-12-08 NOTE — Op Note (Signed)
[facility] [address] [place], [place]  [address]  COLONOSCOPY PROCEDURE REPORT  PATIENT:  Dustin, Dominguez  MR#:  [id number] BIRTHDATE:  Mar 29, 1941, 71 yrs. old  GENDER:  male ENDOSCOPIST:  Vania Rea. Jarold Motto, MD, [facility] REF. BY: PROCEDURE DATE:  12/08/2011 PROCEDURE:  Colonoscopy with snare polypectomy ASA CLASS:  Class III INDICATIONS:  history of pre-cancerous (adenomatous) colon polyps  MEDICATIONS:   propofol (Diprivan) 200 mg IV  DESCRIPTION OF PROCEDURE:   After the risks and benefits and of the procedure were explained, informed consent was obtained. Digital rectal exam was performed and revealed no abnormalities. The LB [id number] endoscope was introduced through the anus and advanced to the cecum, which was identified by both the appendix and ileocecal valve.  The quality of the prep was poor, using MoviPrep.  The instrument was then slowly withdrawn as the colon was fully examined. <<PROCEDUREIMAGES>>  FINDINGS:  ULTRASONIC FINDINGS:   Two polyps were found.  FLAT RIGHT COLON ADENOMAS 5-7 MM HOT SNARE REMOVED. Moderate diverticulosis was found.  This was otherwise a normal examination of the colon.   Retroflexed views in the rectum revealed no abnormalities.    The scope was then withdrawn from the patient and the procedure completed.  COMPLICATIONS:  None ENDOSCOPIC IMPRESSION: 1) Moderate diverticulosis 2) Two polyps 3) Otherwise normal examination R/O ADENOMAS RECOMMENDATIONS: 1) Await pathology results 2) High fiber diet. 3) Repeat Colonoscopy in 5 years. 4) metamucil or benefiber  REPEAT EXAM:  No  ______________________________ Vania Rea. Jarold Motto, MD, [facility]  CC:  Rudi Heap, MD  n. Rosalie Doctor:   Vania Rea. Kateryn Marasigan at 12/08/2011 12:11 PM  Idamae Lusher, [id number]

## 2011-12-08 NOTE — Progress Notes (Signed)
Propofol given and oxygen administered per D Merritt CRNA 
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
## 2011-12-09 ENCOUNTER — Telehealth: Payer: Self-pay

## 2011-12-09 NOTE — Telephone Encounter (Signed)
  Follow up Call-  Call back number 12/08/2011  Post procedure Call Back phone  # [phone]  Permission to leave phone message Yes     Patient questions:  Do you have a fever, pain , or abdominal swelling? no Pain Score  0 *  Have you tolerated food without any problems? yes  Have you been able to return to your normal activities? yes  Do you have any questions about your discharge instructions: Diet   no Medications  no Follow up visit  no  Do you have questions or concerns about your Care? no  Actions: * If pain score is 4 or above: No action needed, pain <4.

## 2011-12-15 ENCOUNTER — Encounter: Payer: Self-pay | Admitting: Gastroenterology

## 2012-03-07 ENCOUNTER — Other Ambulatory Visit: Payer: Self-pay | Admitting: Family Medicine

## 2012-03-07 DIAGNOSIS — R634 Abnormal weight loss: Secondary | ICD-10-CM

## 2012-03-08 ENCOUNTER — Ambulatory Visit
Admission: RE | Admit: 2012-03-08 | Discharge: 2012-03-08 | Disposition: A | Discharge status: Home / Self Care | Payer: Medicare Other | Source: Ambulatory Visit | Attending: Family Medicine | Admitting: Family Medicine

## 2012-03-08 DIAGNOSIS — R634 Abnormal weight loss: Secondary | ICD-10-CM

## 2012-03-08 MED ORDER — IOHEXOL 300 MG/ML  SOLN
125.0000 mL | Freq: Once | INTRAMUSCULAR | Status: AC | PRN
  Administered 2012-03-08: 125 mL via INTRAVENOUS

## 2012-03-10 ENCOUNTER — Other Ambulatory Visit: Payer: Self-pay | Admitting: Family Medicine

## 2012-03-10 DIAGNOSIS — E042 Nontoxic multinodular goiter: Secondary | ICD-10-CM

## 2012-03-13 ENCOUNTER — Ambulatory Visit
Admission: RE | Admit: 2012-03-13 | Discharge: 2012-03-13 | Disposition: A | Discharge status: Home / Self Care | Payer: Medicare Other | Source: Ambulatory Visit | Attending: Family Medicine | Admitting: Family Medicine

## 2012-03-13 DIAGNOSIS — E042 Nontoxic multinodular goiter: Secondary | ICD-10-CM

## 2012-03-14 ENCOUNTER — Encounter: Payer: Self-pay | Admitting: Gastroenterology

## 2012-03-22 ENCOUNTER — Telehealth (INDEPENDENT_AMBULATORY_CARE_PROVIDER_SITE_OTHER): Payer: Self-pay | Admitting: General Surgery

## 2012-03-22 NOTE — Telephone Encounter (Signed)
Called patient to advise of appointment made based on referral. Appointment set for 04/07/12 at 1:30. Advised patient to arrive at 1:00 to ger through registration process and to bring insurance information. Patient agreed.

## 2012-04-06 ENCOUNTER — Ambulatory Visit (INDEPENDENT_AMBULATORY_CARE_PROVIDER_SITE_OTHER): Payer: Medicare Other | Admitting: Gastroenterology

## 2012-04-06 ENCOUNTER — Encounter: Payer: Self-pay | Admitting: Gastroenterology

## 2012-04-06 VITALS — BP 124/64 | HR 64 | Ht 70.0 in | Wt 213.1 lb

## 2012-04-06 DIAGNOSIS — J449 Chronic obstructive pulmonary disease, unspecified: Secondary | ICD-10-CM

## 2012-04-06 DIAGNOSIS — F17200 Nicotine dependence, unspecified, uncomplicated: Secondary | ICD-10-CM

## 2012-04-06 DIAGNOSIS — E042 Nontoxic multinodular goiter: Secondary | ICD-10-CM

## 2012-04-06 DIAGNOSIS — R1310 Dysphagia, unspecified: Secondary | ICD-10-CM

## 2012-04-06 DIAGNOSIS — Z951 Presence of aortocoronary bypass graft: Secondary | ICD-10-CM

## 2012-04-06 DIAGNOSIS — K219 Gastro-esophageal reflux disease without esophagitis: Secondary | ICD-10-CM

## 2012-04-06 DIAGNOSIS — Z86010 Personal history of colonic polyps: Secondary | ICD-10-CM

## 2012-04-06 NOTE — Patient Instructions (Addendum)
You have been given a separate informational sheet regarding your tobacco use, the importance of quitting and local resources to help you quit.   You have been scheduled for an endoscopy with propofol. Please follow written instructions given to you at your visit today. If you use inhalers (even only as needed), please bring them with you on the day of your procedure.  CC: Rudi Heap, M. D.
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
## 2012-04-07 ENCOUNTER — Encounter (INDEPENDENT_AMBULATORY_CARE_PROVIDER_SITE_OTHER): Payer: Self-pay | Admitting: General Surgery

## 2012-04-07 ENCOUNTER — Ambulatory Visit (INDEPENDENT_AMBULATORY_CARE_PROVIDER_SITE_OTHER): Payer: Medicare Other | Admitting: General Surgery

## 2012-04-07 ENCOUNTER — Other Ambulatory Visit (INDEPENDENT_AMBULATORY_CARE_PROVIDER_SITE_OTHER): Payer: Self-pay | Admitting: General Surgery

## 2012-04-07 VITALS — BP 134/68 | HR 70 | Temp 97.6°F | Resp 16 | Ht 70.0 in | Wt 211.5 lb

## 2012-04-07 DIAGNOSIS — E042 Nontoxic multinodular goiter: Secondary | ICD-10-CM
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
## 2012-04-07 NOTE — Patient Instructions (Signed)
You have multiple thyroid nodules which have been present for some time. 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  There is a nodule on the left side and a nodule in the middle which have enlarged, and one of these nodules has calcifications in it. There is a small chance that this could be a low-grade thyroid cancer  You'll be scheduled for a fine needle biopsy of these 2 nodules at the hospital.  Return to see Dr. Derrell Lolling in 3-4 weeks after this test is done.   Thyroid Diseases Your thyroid is a butterfly-shaped gland in your neck. It is located just above your collarbone. It is one of your endocrine glands, which make hormones. The thyroid helps set your metabolism. Metabolism is how your body gets energy from the foods you eat.  Millions of people have thyroid diseases. Women experience thyroid problems more often than men. In fact, overactive thyroid problems (hyperthyroidism) occur in 1% of all women. If you have a thyroid disease, your body may use energy more slowly or quickly than it should.  Thyroid problems also include an immune disease where your body reacts against your thyroid gland (called thyroiditis). A different problem involves lumps and bumps (called nodules) that develop in the gland. The nodules are usually, but not always, noncancerous. THE MOST COMMON THYROID PROBLEMS AND CAUSES ARE DISCUSSED BELOW There are many causes for thyroid problems. Treatment depends upon the exact diagnosis and includes trying to reset your body's metabolism to a normal rate. Hyperthyroidism Too much thyroid hormone from an overactive thyroid gland is called hyperthyroidism. In hyperthyroidism, the body's metabolism speeds up. One of the most frequent forms of hyperthyroidism is known as Graves' disease. Graves' disease tends to run in families. Although Graves' is thought to be caused by a problem with the immune system, the exact nature of the genetic problem is unknown. Hypothyroidism Too  little thyroid hormone from an underactive thyroid gland is called hypothyroidism. In hypothyroidism, the body's metabolism is slowed. Several things can cause this condition. Most causes affect the thyroid gland directly and hurt its ability to make enough hormone.  Rarely, there may be a pituitary gland tumor (located near the base of the brain). The tumor can block the pituitary from producing thyroid-stimulating hormone (TSH). Your body makes TSH to stimulate the thyroid to work properly. If the pituitary does not make enough TSH, the thyroid fails to make enough hormones needed for good health. Whether the problem is caused by thyroid conditions or by the pituitary gland, the result is that the thyroid is not making enough hormones. Hypothyroidism causes many physical and mental processes to become sluggish. The body consumes less oxygen and produces less body heat. Thyroid Nodules A thyroid nodule is a small swelling or lump in the thyroid gland. They are common. These nodules represent either a growth of thyroid tissue or a fluid-filled cyst. Both form a lump in the thyroid gland. Almost half of all people will have tiny thyroid nodules at some point in their lives. Typically, these are not noticeable until they become large and affect normal thyroid size. Larger nodules that are greater than a half inch across (about 1 centimeter) occur in about 5 percent of people. Although most nodules are not cancerous, people who have them should seek medical care to rule out cancer. Also, some thyroid nodules may produce too much thyroid hormone or become too large. Large nodules or a large gland can interfere with breathing or swallowing or may cause neck  discomfort. Other problems Other thyroid problems include cancer and thyroiditis. Thyroiditis is a malfunction of the body's immune system. Normally, the immune system works to defend the body against infection and other problems. When the immune system is not  working properly, it may mistakenly attack normal cells, tissues, and organs. Examples of autoimmune diseases are Hashimoto's thyroiditis (which causes low thyroid function) and Graves' disease (which causes excess thyroid function). SYMPTOMS  Symptoms vary greatly depending upon the exact type of problem with the thyroid. Hyperthyroidism-is when your thyroid is too active and makes more thyroid hormone than your body needs. The most common cause is Graves' Disease. Too much thyroid hormone can cause some or all of the following symptoms:  Anxiety.   Irritability.   Difficulty sleeping.   Fatigue.   A rapid or irregular heartbeat.   A fine tremor of your hands or fingers.   An increase in perspiration.   Sensitivity to heat.   Weight loss, despite normal food intake.   Brittle hair.   Enlargement of your thyroid gland (goiter).   Light menstrual periods.   Frequent bowel movements.  Graves' disease can specifically cause eye and skin problems. The skin problems involve reddening and swelling of the skin, often on your shins and on the top of your feet. Eye problems can include the following:  Excess tearing and sensation of grit or sand in either or both eyes.   Reddened or inflamed eyes.   Widening of the space between your eyelids.   Swelling of the lids and tissues around the eyes.   Light sensitivity.   Ulcers on the cornea.   Double vision.   Limited eye movements.   Blurred or reduced vision.  Hypothyroidism- is when your thyroid gland is not active enough. This is more common than hyperthyroidism. Symptoms can vary a lot depending of the severity of the hormone deficiency. Symptoms may develop over a long period of time and can include several of the following:  Fatigue.   Sluggishness.   Increased sensitivity to cold.   Constipation.   Pale, dry skin.   A puffy face.   Hoarse voice.   High blood cholesterol level.   Unexplained weight gain.     Muscle aches, tenderness and stiffness.   Pain, stiffness or swelling in your joints.   Muscle weakness.   Heavier than normal menstrual periods.   Brittle fingernails and hair.   Depression.  Thyroid Nodules - most do not cause signs or symptoms. Occasionally, some may become so large that you can feel or even see the swelling at the base of your neck. You may realize a lump or swelling is there when you are shaving or putting on makeup. Men might become aware of a nodule when shirt collars suddenly feel too tight. Some nodules produce too much thyroid hormone. This can produce the same symptoms as hyperthyroidism (see above). Thyroid nodules are seldom cancerous. However, a nodule is more likely to be malignant (cancerous) if it:  Grows quickly or feels hard.   Causes you to become hoarse or to have trouble swallowing or breathing.   Causes enlarged lymph nodes under your jaw or in your neck.  DIAGNOSIS  Because there are so many possible thyroid conditions, your caregiver may ask for a number of tests. They will do this in order to narrow down the exact diagnosis. These tests can include:  Blood and antibody tests.   Special thyroid scans using small, safe amounts of radioactive iodine.  Ultrasound of the thyroid gland (particularly if there is a nodule or lump).   Biopsy. This is usually done with a special needle. A needle biopsy is a procedure to obtain a sample of cells from the thyroid. The tissue will be tested in a lab and examined under a microscope.  TREATMENT  Treatment depends on the exact diagnosis. Hyperthyroidism  Beta-blockers help relieve many of the symptoms.   Anti-thyroid medications prevent the thyroid from making excess hormones.   Radioactive iodine treatment can destroy overactive thyroid cells. The iodine can permanently decrease the amount of hormone produced.   Surgery to remove the thyroid gland.   Treatments for eye problems that come from  Graves' disease also include medications and special eye surgery, if felt to be appropriate.  Hypothyroidism Thyroid replacement with levothyroxine is the mainstay of treatment. Treatment with thyroid replacement is usually lifelong and will require monitoring and adjustment from time to time. Thyroid Nodules  Watchful waiting. If a small nodule causes no symptoms or signs of cancer on biopsy, then no treatment may be chosen at first. Re-exam and re-checking blood tests would be the recommended follow-up.   Anti-thyroid medications or radioactive iodine treatment may be recommended if the nodules produce too much thyroid hormone (see Treatment for Hyperthyroidism above).   Alcohol ablation. Injections of small amounts of ethyl alcohol (ethanol) can cause a non-cancerous nodule to shrink in size.   Surgery (see Treatment for Hyperthyroidism above).  HOME CARE INSTRUCTIONS   Take medications as instructed.   Follow through on recommended testing.  SEEK MEDICAL CARE IF:   You feel that you are developing symptoms of Hyperthyroidism or Hypothyroidism as described above.   You develop a new lump/nodule in the neck/thyroid area that you had not noticed before.   You feel that you are having side effects from medicines prescribed.   You develop trouble breathing or swallowing.  SEEK IMMEDIATE MEDICAL CARE IF:   You develop a fever of 102 F (38.9 C) or higher.   You develop severe sweating.   You develop palpitations and/or rapid heart beat.   You develop shortness of breath.   You develop nausea and vomiting.   You develop extreme shakiness.   You develop agitation.   You develop lightheadedness or have a fainting episode.  Document Released: 05/02/2007 Document Revised: 06/24/2011 Document Reviewed: 05/02/2007 [facility] Patient Information 2012 [place], [place].

## 2012-04-10 ENCOUNTER — Other Ambulatory Visit (INDEPENDENT_AMBULATORY_CARE_PROVIDER_SITE_OTHER): Payer: Self-pay | Admitting: General Surgery

## 2012-04-10 DIAGNOSIS — E042 Nontoxic multinodular goiter: Secondary | ICD-10-CM

## 2012-04-11 ENCOUNTER — Encounter (INDEPENDENT_AMBULATORY_CARE_PROVIDER_SITE_OTHER): Payer: Self-pay

## 2012-04-11 ENCOUNTER — Ambulatory Visit
Admission: RE | Admit: 2012-04-11 | Discharge: 2012-04-11 | Disposition: A | Discharge status: Home / Self Care | Payer: Medicare Other | Source: Ambulatory Visit | Attending: General Surgery | Admitting: General Surgery

## 2012-04-11 ENCOUNTER — Other Ambulatory Visit (HOSPITAL_COMMUNITY)
Admission: RE | Admit: 2012-04-11 | Discharge: 2012-04-11 | Disposition: A | Discharge status: Home / Self Care | Payer: Medicare Other | Source: Ambulatory Visit | Attending: Diagnostic Radiology | Admitting: Diagnostic Radiology

## 2012-04-11 DIAGNOSIS — E049 Nontoxic goiter, unspecified: Secondary | ICD-10-CM | POA: Insufficient documentation

## 2012-04-11 DIAGNOSIS — E042 Nontoxic multinodular goiter: Secondary | ICD-10-CM

## 2012-04-27 ENCOUNTER — Encounter: Payer: Self-pay | Admitting: Gastroenterology

## 2012-04-27 ENCOUNTER — Other Ambulatory Visit: Payer: Self-pay | Admitting: Gastroenterology

## 2012-04-27 ENCOUNTER — Ambulatory Visit (AMBULATORY_SURGERY_CENTER): Payer: Medicare Other | Admitting: Gastroenterology

## 2012-04-27 VITALS — BP 103/47 | HR 67 | Temp 98.7°F | Resp 22 | Ht 70.0 in | Wt 213.0 lb

## 2012-04-27 DIAGNOSIS — K219 Gastro-esophageal reflux disease without esophagitis: Secondary | ICD-10-CM

## 2012-04-27 DIAGNOSIS — K2940 Chronic atrophic gastritis without bleeding: Secondary | ICD-10-CM

## 2012-04-27 DIAGNOSIS — R1310 Dysphagia, unspecified: Secondary | ICD-10-CM

## 2012-04-27 DIAGNOSIS — K2950 Unspecified chronic gastritis without bleeding: Secondary | ICD-10-CM

## 2012-04-27 LAB — GLUCOSE, CAPILLARY
Glucose-Capillary: 97 mg/dL (ref 70–99)
Glucose-Capillary: 113 mg/dL — ABNORMAL HIGH (ref 70–99)
Glucose-Capillary: 74 mg/dL (ref 70–99)
Glucose-Capillary: 78 mg/dL (ref 70–99)

## 2012-04-27 MED ORDER — SUCRALFATE 1 GM/10ML PO SUSP: 1.0000 g | Freq: Three times a day (TID) | ORAL | Status: DC

## 2012-04-27 NOTE — Progress Notes (Signed)
prpofol per [facility] crna, see scanned intra procedure report. ewm
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
## 2012-04-27 NOTE — Patient Instructions (Addendum)
YOU HAD AN ENDOSCOPIC PROCEDURE TODAY AT THE [facility]: Refer to the procedure report that was given to you for any specific questions about what was found during the examination.  If the procedure report does not answer your questions, please call your gastroenterologist to clarify.  If you requested that your care partner not be given the details of your procedure findings, then the procedure report has been included in a sealed envelope for you to review at your convenience later.  YOU SHOULD EXPECT: Some feelings of bloating in the abdomen. Passage of more gas than usual.  Walking can help get rid of the air that was put into your GI tract during the procedure and reduce the bloating. If you had a lower endoscopy (such as a colonoscopy or flexible sigmoidoscopy) you may notice spotting of blood in your stool or on the toilet paper. If you underwent a bowel prep for your procedure, then you may not have a normal bowel movement for a few days.  DIET: Your first meal following the procedure should be a light meal and then it is ok to progress to your normal diet.  A half-sandwich or bowl of soup is an example of a good first meal.  Heavy or fried foods are harder to digest and may make you feel nauseous or bloated.  Likewise meals heavy in dairy and vegetables can cause extra gas to form and this can also increase the bloating.  Drink plenty of fluids but you should avoid alcoholic beverages for 24 hours.  ACTIVITY: Your care partner should take you home directly after the procedure.  You should plan to take it easy, moving slowly for the rest of the day.  You can resume normal activity the day after the procedure however you should NOT DRIVE or use heavy machinery for 24 hours (because of the sedation medicines used during the test).    SYMPTOMS TO REPORT IMMEDIATELY: A gastroenterologist can be reached at any hour.  During normal business hours, 8:30 AM to 5:00 PM Monday through Friday,  call [phone].  After hours and on weekends, please call the GI answering service at [phone] who will take a message and have the physician on call contact you.     Following upper endoscopy (EGD)  Vomiting of blood or coffee ground material  New chest pain or pain under the shoulder blades  Painful or persistently difficult swallowing  New shortness of breath  Fever of 100F or higher  Black, tarry-looking stools  FOLLOW UP: If any biopsies were taken you will be contacted by phone or by letter within the next 1-3 weeks.  Call your gastroenterologist if you have not heard about the biopsies in 3 weeks.  Our staff will call the home number listed on your records the next business day following your procedure to check on you and address any questions or concerns that you may have at that time regarding the information given to you following your procedure. This is a courtesy call and so if there is no answer at the home number and we have not heard from you through the emergency physician on call, we will assume that you have returned to your regular daily activities without incident.  SIGNATURES/CONFIDENTIALITY:      You and/or your care partner have signed paperwork which will be entered into your electronic medical record.  These signatures attest to the fact that that the information above on your After Visit Summary has been  reviewed and is understood.  Full responsibility of the confidentiality of this discharge information lies with you and/or your care-partner.    carafate ordered by Dr Jarold Motto & sent to [facility] for you to pick up  Continue Protonix twice daily, take 30 minutes before 1st meal of the day and in evening  Await biopsy results

## 2012-04-28 ENCOUNTER — Telehealth: Payer: Self-pay | Admitting: *Deleted

## 2012-04-28 ENCOUNTER — Telehealth: Payer: Self-pay | Admitting: Gastroenterology

## 2012-04-28 DIAGNOSIS — K2970 Gastritis, unspecified, without bleeding: Secondary | ICD-10-CM

## 2012-04-28 LAB — HELICOBACTER PYLORI SCREEN-BIOPSY: UREASE: NEGATIVE

## 2012-04-28 MED ORDER — SUCRALFATE 1 G PO TABS: 1.0000 g | ORAL_TABLET | Freq: Four times a day (QID) | ORAL | Status: DC

## 2012-04-28 NOTE — Telephone Encounter (Signed)
Pt states that the carafate was too expensive and pt wondered if there was an alternitive to this he could try. Please advise. Thanks  [employer]

## 2012-04-28 NOTE — Telephone Encounter (Signed)
Left message for patient to call back. Dr Jarold Motto has recommended a barium esophagram. Patient has been scheduled for the esophagram on 05/03/12 @ 9:30 am.

## 2012-04-28 NOTE — Telephone Encounter (Signed)
Pt notified pills will be called in and let us know if this is not cheap enough. Pt returned verbal understanding. ewm

## 2012-04-28 NOTE — Telephone Encounter (Signed)
Left message on number given in admitting yesterday as directed per pt. ewm 

## 2012-04-28 NOTE — Telephone Encounter (Signed)
carafate 1 gm pills pc...not the suspension

## 2012-05-01 ENCOUNTER — Encounter: Payer: Self-pay | Admitting: Gastroenterology

## 2012-05-01 NOTE — Telephone Encounter (Signed)
Left message for patient to call back  

## 2012-05-02 NOTE — Telephone Encounter (Signed)
I have spoken to patient and advised him of upcoming barium swallow test time and date. He verbalizes understanding.

## 2012-05-03 ENCOUNTER — Ambulatory Visit (HOSPITAL_COMMUNITY)
Admission: RE | Admit: 2012-05-03 | Discharge: 2012-05-03 | Disposition: A | Discharge status: Home / Self Care | Payer: Medicare Other | Source: Ambulatory Visit | Attending: Gastroenterology | Admitting: Gastroenterology

## 2012-05-03 DIAGNOSIS — K224 Dyskinesia of esophagus: Secondary | ICD-10-CM | POA: Insufficient documentation

## 2012-05-03 DIAGNOSIS — R1310 Dysphagia, unspecified: Secondary | ICD-10-CM | POA: Insufficient documentation

## 2012-05-03 DIAGNOSIS — M5380 Other specified dorsopathies, site unspecified: Secondary | ICD-10-CM | POA: Insufficient documentation

## 2012-05-10 ENCOUNTER — Ambulatory Visit (INDEPENDENT_AMBULATORY_CARE_PROVIDER_SITE_OTHER): Payer: Medicare Other | Admitting: General Surgery

## 2012-05-10 ENCOUNTER — Encounter (INDEPENDENT_AMBULATORY_CARE_PROVIDER_SITE_OTHER): Payer: Self-pay | Admitting: General Surgery

## 2012-05-10 VITALS — BP 138/78 | HR 61 | Temp 98.0°F | Resp 18 | Ht 72.0 in | Wt 212.8 lb

## 2012-05-10 DIAGNOSIS — E042 Nontoxic multinodular goiter: Secondary | ICD-10-CM

## 2012-05-10 NOTE — Patient Instructions (Signed)
Biopsy of the 2 thyroid nodules showed completely benign follicular nodules.  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 There is no indication for surgery at this time.  Return to see Dr. Derrell Lolling in one year, and we will schedule a thyroid ultrasound before the office visit. We will to make sure that these nodules do not grow much.

## 2012-05-10 NOTE — Progress Notes (Signed)
Patient ID: Dustin Dominguez, male   DOB: 10/02/1940, 71 y.o.   MRN: [id number] History: This patient was recently evaluated for multinodular goiter. A nodule of the left thyroid gland and another in the isthmus had grown in size. He did not have any risk factors for thyroid cancer. He has numerous comorbidities. Thyroid function tests are normal. He was referred for image guided biopsy. On 04/11/2012 he underwent ultrasound-guided biopsy of a left thyroid nodule an ultrasound-guided biopsy of a thyroid isthmus nodule. Cytopathology was read as benign follicular nodule by Dr. Colonel Bald. Peggye Form discussed these findings with the patient and told him that he does not need an operation but does need annual followup for a while. 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  Exam: Patient looks well. In no distress. Neck reveals palpable nodule in the right side of his isthmus. No cervical adenopathy. Lungs clear to auscultation bilateral Heart regular rate and rhythm. No ectopy  Assessment: Benign, nontoxic multinodular goiter. Considering history, physical findings, imaging findings, and cytopathology, I think that he is at low risk for thyroid cancer. Considering his multiple comorbidities I think he should be followed closely and try to avoid thyroidectomy if possible. Numerous comorbidities, previously listed  Plan: Return to see me in one year and get thyroid ultrasound at that time.   Angelia Mould. Derrell Lolling, M.D., [facility] General and Minimally invasive Surgery Breast and Colorectal Surgery Office:   [phone] Pager:   [phone]

## 2012-05-11 ENCOUNTER — Other Ambulatory Visit (INDEPENDENT_AMBULATORY_CARE_PROVIDER_SITE_OTHER): Payer: Self-pay | Admitting: General Surgery

## 2012-05-11 DIAGNOSIS — E042 Nontoxic multinodular goiter: Secondary | ICD-10-CM

## 2012-06-07 ENCOUNTER — Encounter: Payer: Self-pay | Admitting: Cardiology

## 2012-07-19 HISTORY — PX: EYE SURGERY: SHX253

## 2012-08-24 ENCOUNTER — Ambulatory Visit (INDEPENDENT_AMBULATORY_CARE_PROVIDER_SITE_OTHER): Payer: Medicare Other | Admitting: Cardiology

## 2012-08-24 ENCOUNTER — Encounter: Payer: Self-pay | Admitting: Cardiology

## 2012-08-24 VITALS — BP 103/64 | HR 64 | Ht 71.0 in | Wt 214.0 lb

## 2012-08-24 DIAGNOSIS — I10 Essential (primary) hypertension: Secondary | ICD-10-CM

## 2012-08-24 DIAGNOSIS — I2510 Atherosclerotic heart disease of native coronary artery without angina pectoris: Secondary | ICD-10-CM

## 2012-08-24 DIAGNOSIS — E782 Mixed hyperlipidemia: Secondary | ICD-10-CM

## 2012-08-24 NOTE — Assessment & Plan Note (Signed)
Followed by Dr. Christell Constant, continues on Crestor.

## 2012-08-24 NOTE — Patient Instructions (Signed)
Continue all current medications. Your physician wants you to follow up in: 6 months.  You will receive a reminder letter in the mail one-two months in advance.  If you don't receive a letter, please call our office to schedule the follow up appointment   

## 2012-08-24 NOTE — Assessment & Plan Note (Signed)
Good blood pressure control today. No changes made to regimen.
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
## 2012-08-24 NOTE — Assessment & Plan Note (Signed)
Multivessel disease status post CABG, does report regular angina on medical therapy, although relatively stable symptoms. We have discussed the matter, and he does not want to pursue a stress test, but may eventually consider a cardiac catheterization if his symptoms worsen. We will continue observation for now.

## 2012-10-05 ENCOUNTER — Other Ambulatory Visit (INDEPENDENT_AMBULATORY_CARE_PROVIDER_SITE_OTHER): Payer: Medicare Other

## 2012-10-05 DIAGNOSIS — I10 Essential (primary) hypertension: Secondary | ICD-10-CM

## 2012-10-05 DIAGNOSIS — E1059 Type 1 diabetes mellitus with other circulatory complications: Secondary | ICD-10-CM

## 2012-10-05 DIAGNOSIS — E785 Hyperlipidemia, unspecified: Secondary | ICD-10-CM

## 2012-10-05 DIAGNOSIS — E559 Vitamin D deficiency, unspecified: Secondary | ICD-10-CM

## 2012-10-05 LAB — HEPATIC FUNCTION PANEL
Albumin: 4.1 g/dL (ref 3.5–5.2)
Alkaline Phosphatase: 39 U/L (ref 39–117)
Total Bilirubin: 0.5 mg/dL (ref 0.3–1.2)
Total Protein: 6.6 g/dL (ref 6.0–8.3)

## 2012-10-05 LAB — BASIC METABOLIC PANEL
CO2: 30 mEq/L (ref 19–32)
Calcium: 9.9 mg/dL (ref 8.4–10.5)
Creat: 1 mg/dL (ref 0.50–1.35)

## 2012-10-05 NOTE — Progress Notes (Signed)
Pt came in for labs only 

## 2012-10-06 ENCOUNTER — Telehealth: Payer: Self-pay | Admitting: Family Medicine

## 2012-10-06 LAB — VITAMIN D 25 HYDROXY (VIT D DEFICIENCY, FRACTURES): Vit D, 25-Hydroxy: 32 ng/mL (ref 30–89)

## 2012-10-06 LAB — NMR, LIPOPROFILE

## 2012-10-06 NOTE — Telephone Encounter (Signed)
Pt notified Crestor 20mg  samples #28 ready for pickup.

## 2012-10-06 NOTE — Telephone Encounter (Signed)
Samples of crestor 20mg 

## 2012-10-27 ENCOUNTER — Encounter: Payer: Self-pay | Admitting: Family Medicine

## 2012-10-31 ENCOUNTER — Other Ambulatory Visit: Payer: Self-pay | Admitting: Family Medicine

## 2012-11-23 ENCOUNTER — Telehealth: Payer: Self-pay | Admitting: Family Medicine

## 2012-11-23 MED ORDER — ROSUVASTATIN CALCIUM 20 MG PO TABS: 20.0000 mg | ORAL_TABLET | Freq: Every day | ORAL | Status: DC

## 2012-11-23 NOTE — Telephone Encounter (Signed)
Samples upfront and patient notified.

## 2012-12-04 ENCOUNTER — Other Ambulatory Visit: Payer: Self-pay

## 2012-12-04 ENCOUNTER — Other Ambulatory Visit: Payer: Self-pay | Admitting: Family Medicine

## 2012-12-04 MED ORDER — OLMESARTAN MEDOXOMIL 20 MG PO TABS: 20.0000 mg | ORAL_TABLET | Freq: Every day | ORAL | Status: DC

## 2012-12-04 MED ORDER — ISOSORBIDE MONONITRATE ER 30 MG PO TB24: 30.0000 mg | ORAL_TABLET | Freq: Every day | ORAL | Status: DC

## 2012-12-04 NOTE — Telephone Encounter (Signed)
Last seen 09/07/12    Patient does not have Benicar on med list  Faxed from the drug store

## 2012-12-11 ENCOUNTER — Encounter: Payer: Self-pay | Admitting: Cardiology

## 2013-01-02 ENCOUNTER — Other Ambulatory Visit: Payer: Self-pay | Admitting: Family Medicine

## 2013-01-02 ENCOUNTER — Telehealth: Payer: Self-pay | Admitting: Family Medicine

## 2013-01-03 ENCOUNTER — Encounter: Payer: Self-pay | Admitting: *Deleted

## 2013-01-03 NOTE — Telephone Encounter (Signed)
NO SAMPLES AT THIS TIME

## 2013-01-03 NOTE — Telephone Encounter (Signed)
Error

## 2013-01-11 ENCOUNTER — Encounter: Payer: Self-pay | Admitting: Family Medicine

## 2013-01-11 ENCOUNTER — Ambulatory Visit (INDEPENDENT_AMBULATORY_CARE_PROVIDER_SITE_OTHER): Payer: Medicare Other | Admitting: Family Medicine

## 2013-01-11 VITALS — BP 97/57 | HR 67 | Temp 98.5°F | Ht 71.0 in | Wt 199.8 lb

## 2013-01-11 DIAGNOSIS — I10 Essential (primary) hypertension: Secondary | ICD-10-CM

## 2013-01-11 DIAGNOSIS — G8929 Other chronic pain: Secondary | ICD-10-CM

## 2013-01-11 DIAGNOSIS — E119 Type 2 diabetes mellitus without complications: Secondary | ICD-10-CM

## 2013-01-11 DIAGNOSIS — E559 Vitamin D deficiency, unspecified: Secondary | ICD-10-CM

## 2013-01-11 DIAGNOSIS — E785 Hyperlipidemia, unspecified: Secondary | ICD-10-CM

## 2013-01-11 DIAGNOSIS — I25810 Atherosclerosis of coronary artery bypass graft(s) without angina pectoris: Secondary | ICD-10-CM

## 2013-01-11 LAB — BASIC METABOLIC PANEL WITH GFR
CO2: 28 mEq/L (ref 19–32)
Chloride: 105 mEq/L (ref 96–112)
Potassium: 5 mEq/L (ref 3.5–5.3)
Sodium: 139 mEq/L (ref 135–145)

## 2013-01-11 LAB — POCT CBC
Hemoglobin: 15 g/dL (ref 14.1–18.1)
MCH, POC: 32.1 pg — AB (ref 27–31.2)
MPV: 7.2 fL (ref 0–99.8)
POC LYMPH PERCENT: 39.2 %L (ref 10–50)
RBC: 4.7 M/uL (ref 4.69–6.13)

## 2013-01-11 LAB — HEPATIC FUNCTION PANEL
AST: 24 U/L (ref 0–37)
Alkaline Phosphatase: 41 U/L (ref 39–117)
Bilirubin, Direct: 0.1 mg/dL (ref 0.0–0.3)
Total Bilirubin: 0.4 mg/dL (ref 0.3–1.2)

## 2013-01-11 LAB — POCT GLYCOSYLATED HEMOGLOBIN (HGB A1C): Hemoglobin A1C: 5.3

## 2013-01-11 NOTE — Patient Instructions (Addendum)
Continue aggressive therapeutic lifestyle change You will discontinue the Benicar as you have already done. Continue medications otherwise as direct

## 2013-01-11 NOTE — Progress Notes (Signed)
  Subjective:    Patient ID: Dustin Dominguez, male    DOB: 04/25/1941, 72 y.o.   MRN: [id number]  HPI Patient comes in today for followup chronic medical problems. He comes by himself. He has a history of chronic neck and back pain. 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 He also has type 2 diabetes mellitus, ASCVD, low testosterone, hypertension and a multinodular goiter. He sees Dr. Ethelene Hal, the orthopedist, periodically for management of his chronic pain in his neck and back secondary to degenerative disc disease. Somehow the patient was taking Benicar 20 mg in addition to his Accuretic. He did this for about 5 or 6 days. He is no longer taking the Benicar. We will get this straightened out.   Review of Systems  Constitutional: Positive for fatigue (chronic).  HENT: Positive for congestion and postnasal drip.   Eyes: Negative.   Respiratory: Positive for shortness of breath.   Cardiovascular: Positive for chest pain (occasional) and leg swelling (intermitent).  Gastrointestinal: Positive for constipation (constant).  Endocrine: Negative.   Genitourinary: Positive for frequency.  Musculoskeletal: Positive for back pain (LBP constant) and arthralgias (constant).  Neurological: Positive for dizziness (occasional) and headaches (occasional).  Psychiatric/Behavioral: The patient is nervous/anxious (some).        Objective:   Physical Exam BP 97/57  Pulse 67  Temp(Src) 98.5 F (36.9 C) (Oral)  Ht 5\' 11"  (1.803 m)  Wt 199 lb 12.8 oz (90.629 kg)  BMI 27.88 kg/m2 Repeat blood pressure in left arm sitting was 120/58 The patient appeared well nourished and normally developed, somewhat contorted body habitus but alert and oriented to time and place. Speech, behavior and judgement appear normal. Vital signs as documented.  Head exam is unremarkable. No scleral icterus or pallor noted. Ears nose and throat were normal limit Neck is without jugular venous distension, thyromegally, or carotid bruits.  Carotid upstrokes are brisk bilaterally. No cervical adenopathy. Lungs are clear anteriorly and posteriorly to auscultation. Normal respiratory effort. Cardiac exam reveals regular rate and rhythm at 72 per minute. First and second heart sounds normal.  No murmurs, rubs or gallops.  Abdominal exam reveals normal bowl sounds, no masses, no organomegaly and no aortic enlargement. No inguinal adenopathy. Extremities are nonedematous and both femoral and pedal pulses are normal. Skin without pallor or jaundice.  Warm and dry, without rash. He has multiple actinic keratoses on his scalp Neurologic exam reveals normal deep tendon reflexes and normal sensation. Diabetic foot exam  was done today          Assessment & Plan:  1. Hypertension - POCT CBC; Standing - BASIC METABOLIC PANEL WITH GFR; Standing - POCT CBC - BASIC METABOLIC PANEL WITH GFR  2. Type 2 diabetes mellitus, controllred - POCT glycosylated hemoglobin (Hb A1C); Standing - POCT glycosylated hemoglobin (Hb A1C)  3. Hyperlipemia - NMR Lipoprofile with Lipids; Standing - Hepatic function panel; Standing - NMR Lipoprofile with Lipids - Hepatic function panel  4. Chronic pain secondary to cervical and lumbar degenerative disc disease  5. CAD (coronary artery disease) of artery bypass graft  6. Vitamin D deficiency - Vitamin D 25 hydroxy; Standing - Vitamin D 25 hydroxy  Patient Instructions  Continue aggressive therapeutic lifestyle change You will discontinue the Benicar as you have already done. Continue medications otherwise as direct    Be careful and don't put yourself at risk of falling

## 2013-01-12 LAB — NMR LIPOPROFILE WITH LIPIDS
Cholesterol, Total: 105 mg/dL (ref <200)
HDL Particle Number: 31.7 umol/L (ref >=30.5)
HDL-C: 28 mg/dL — ABNORMAL LOW (ref >=40)
LDL (calc): 39 mg/dL (ref <100)
LP-IR Score: 57 — ABNORMAL HIGH (ref <=45)
Triglycerides: 191 mg/dL — ABNORMAL HIGH (ref <150)

## 2013-01-15 ENCOUNTER — Other Ambulatory Visit: Payer: Self-pay | Admitting: Family Medicine

## 2013-01-30 ENCOUNTER — Telehealth: Payer: Self-pay | Admitting: Family Medicine

## 2013-01-31 MED ORDER — ROSUVASTATIN CALCIUM 20 MG PO TABS: 20.0000 mg | ORAL_TABLET | Freq: Every day | ORAL | Status: DC

## 2013-01-31 NOTE — Telephone Encounter (Signed)
Patient aware that samples are up front.  

## 2013-02-04 ENCOUNTER — Other Ambulatory Visit: Payer: Self-pay | Admitting: Family Medicine

## 2013-02-06 NOTE — Telephone Encounter (Signed)
Pharmacy sent refill request but do not see med on med list. Should he be taking? Please advise

## 2013-02-06 NOTE — Telephone Encounter (Signed)
Patient doesn't take this medication. He said that his wife takes this medication not his

## 2013-02-06 NOTE — Telephone Encounter (Signed)
It is not recommended anymore to take Benicar with Accuretic. We are therefore going to change the medicine to Accupril 40 and HCTZ 12.5 one daily of each. We will discontinue are no longer take Benicar 20 Please call the patient and make sure he understands this. Then called her drugstore and tell them to discontinue the Benicar. To start Accupril 40 generic one daily and HCTZ 12.5 one daily. In this way we do not have to drugs interacting with one another.

## 2013-02-15 ENCOUNTER — Other Ambulatory Visit: Payer: Self-pay | Admitting: *Deleted

## 2013-02-15 MED ORDER — QUINAPRIL-HYDROCHLOROTHIAZIDE 20-12.5 MG PO TABS: 1.0000 | ORAL_TABLET | Freq: Every day | ORAL | Status: DC

## 2013-03-08 ENCOUNTER — Other Ambulatory Visit: Payer: Self-pay | Admitting: Family Medicine

## 2013-03-09 ENCOUNTER — Other Ambulatory Visit: Payer: Self-pay

## 2013-03-09 NOTE — Telephone Encounter (Signed)
Last seen 01/11/13 DWM  Last Vit D level 01/11/13  Normal 54

## 2013-03-09 NOTE — Telephone Encounter (Signed)
Last seen 01/11/13  DWM  If approved route to nurse to call in

## 2013-03-12 MED ORDER — DIAZEPAM 10 MG PO TABS: 10.0000 mg | ORAL_TABLET | Freq: Two times a day (BID) | ORAL | Status: DC | PRN

## 2013-03-12 NOTE — Telephone Encounter (Signed)
Called in.

## 2013-03-16 ENCOUNTER — Other Ambulatory Visit: Payer: Medicare Other

## 2013-03-20 ENCOUNTER — Telehealth: Payer: Self-pay | Admitting: Family Medicine

## 2013-03-22 NOTE — Telephone Encounter (Signed)
Samples up front patient aware  

## 2013-04-09 ENCOUNTER — Telehealth: Payer: Self-pay | Admitting: Family Medicine

## 2013-04-10 NOTE — Telephone Encounter (Signed)
CRESTOR SAMPLES UP FRONT- PT AWARE

## 2013-04-17 ENCOUNTER — Other Ambulatory Visit: Payer: Self-pay

## 2013-04-17 MED ORDER — DIAZEPAM 10 MG PO TABS: 10.0000 mg | ORAL_TABLET | Freq: Two times a day (BID) | ORAL | Status: DC | PRN

## 2013-04-17 NOTE — Telephone Encounter (Signed)
Last seen 01/11/13  DWM  If approved route to nurse to phone into [facility]  [phone]

## 2013-04-17 NOTE — Telephone Encounter (Signed)
This is okay for 6 month 

## 2013-04-18 ENCOUNTER — Other Ambulatory Visit: Payer: Self-pay | Admitting: Family Medicine

## 2013-04-18 MED ORDER — DIAZEPAM 10 MG PO TABS: 10.0000 mg | ORAL_TABLET | Freq: Two times a day (BID) | ORAL | Status: DC | PRN

## 2013-04-18 NOTE — Addendum Note (Signed)
Addended by: Gwenith Daily on: 04/18/2013 06:09 PM   Modules accepted: Orders

## 2013-04-18 NOTE — Telephone Encounter (Addendum)
Phoned in [facility] and left authorization on voicemail for 5 refills.

## 2013-05-11 ENCOUNTER — Telehealth (INDEPENDENT_AMBULATORY_CARE_PROVIDER_SITE_OTHER): Payer: Self-pay | Admitting: *Deleted

## 2013-05-11 NOTE — Telephone Encounter (Signed)
I spoke with pt and informed him of his appt for his [place] at [facility] on 05/14/13 with an arrival time of 10:45am.  Also I informed pt that I had to reschedule his appt with Dr.Ingram since his [place] has not yet been done.  His new appt info with Dr. Derrell Lolling is 05/31/13 @ 10:45.  Pt agreeable.

## 2013-05-14 ENCOUNTER — Ambulatory Visit
Admission: RE | Admit: 2013-05-14 | Discharge: 2013-05-14 | Disposition: A | Discharge status: Home / Self Care | Payer: Medicare Other | Source: Ambulatory Visit | Attending: General Surgery | Admitting: General Surgery

## 2013-05-14 ENCOUNTER — Ambulatory Visit (INDEPENDENT_AMBULATORY_CARE_PROVIDER_SITE_OTHER): Payer: Medicare Other | Admitting: General Surgery

## 2013-05-14 ENCOUNTER — Ambulatory Visit: Payer: Medicare Other | Admitting: Family Medicine

## 2013-05-21 ENCOUNTER — Other Ambulatory Visit: Payer: Self-pay | Admitting: Family Medicine

## 2013-05-21 ENCOUNTER — Ambulatory Visit: Payer: Medicare Other | Admitting: Family Medicine

## 2013-05-22 ENCOUNTER — Other Ambulatory Visit: Payer: Self-pay

## 2013-05-22 MED ORDER — METOPROLOL TARTRATE 50 MG PO TABS: 25.0000 mg | ORAL_TABLET | Freq: Two times a day (BID) | ORAL | Status: DC

## 2013-05-31 ENCOUNTER — Encounter (INDEPENDENT_AMBULATORY_CARE_PROVIDER_SITE_OTHER): Payer: Self-pay | Admitting: General Surgery

## 2013-05-31 ENCOUNTER — Ambulatory Visit (INDEPENDENT_AMBULATORY_CARE_PROVIDER_SITE_OTHER): Payer: Medicare Other | Admitting: General Surgery

## 2013-05-31 VITALS — BP 116/70 | HR 68 | Temp 98.5°F | Resp 14 | Ht 72.0 in | Wt 206.8 lb

## 2013-05-31 DIAGNOSIS — E042 Nontoxic multinodular goiter: Secondary | ICD-10-CM

## 2013-05-31 NOTE — Progress Notes (Signed)
Patient ID: Dustin Dominguez, male   DOB: 03-23-1941, 72 y.o.   MRN: [id number]  History:  This patient was previously evaluated for multinodular goiter. A nodule of the left thyroid gland and another in the isthmus had grown in size. He did not have any risk factors for thyroid cancer. He has numerous comorbidities. Thyroid function tests are normal.  He was referred for image guided biopsy. On 04/11/2012 he underwent ultrasound-guided biopsy of a left thyroid nodule an ultrasound-guided biopsy of a thyroid isthmus nodule. Cytopathology was read as benign follicular nodule by Dr. Colonel Bald. Peggye Form discussed these findings with the patient and told him that he does not need an operation but does need annual followup for a while.   Followup thyroid ultrasound was performed: 05/14/2013. This shows that the nodules are all stable. No enlargement. 2.8 cm nodule on the right side of the isthmus. 3.1 cm left-sided nodule with calcification. Other nodules are noted. No lymphadenopathy. There were no enlarging or suspicious dominant nodules identified.  ROS:  10 system review of systems is negative except as described above.Has esophageal motility disorder, followed by Dr. Sheryn Bison  Exam:  Patient looks well. In no distress.  Mouth negative. Tongue clear. No oral or tongue lesions. Neck reveals Bilateral, soft palpable nodules.   No cervical adenopathy. No occipital adenopathy. Lungs clear to auscultation bilateral  Heart regular rate and rhythm. No ectopy   Assessment:  Benign, nontoxic multinodular goiter. Considering history, physical findings, imaging findings, and cytopathology, I think that he is at low risk for thyroid cancer. Considering his multiple comorbidities I think he should be followed Periodically and try to avoid thyroidectomy if possible.   Coronary artery disease, status post CABG  Tobacco abuse  Non-insulin-dependent diabetes mellitus  Hypertension  Sleep apnea  Multiple,  complex abdominal operations.   Plan: Return to see me as needed. Thyroid ultrasound in 2 years and review with PCP.    Angelia Mould. Derrell Lolling, M.D., [facility]  General and Minimally invasive Surgery  Breast and Colorectal Surgery  Office: [phone]  Pager: [phone]

## 2013-05-31 NOTE — Patient Instructions (Signed)
Examination of her neck reveals bilateral, soft thyroid nodules. These have not gotten any larger  by physical exam.  The ultrasound  again shows multiple thyroid nodules, but they have not enlarged by ultrasound criteria either.  Most likely you have a benign, nontoxic, multinodular goiter.  I do not recommend any surgical intervention.  I recommend that you get a thyroid ultrasound in 2 years and review that with Dr. Christell Constant.  Return to see Dr. Derrell Lolling as needed or if anything changes.

## 2013-06-11 ENCOUNTER — Other Ambulatory Visit: Payer: Self-pay | Admitting: Family Medicine

## 2013-06-13 ENCOUNTER — Encounter: Payer: Self-pay | Admitting: Family Medicine

## 2013-06-13 ENCOUNTER — Ambulatory Visit (INDEPENDENT_AMBULATORY_CARE_PROVIDER_SITE_OTHER): Payer: Medicare Other | Admitting: Family Medicine

## 2013-06-13 VITALS — BP 140/74 | HR 63 | Temp 96.9°F | Ht 72.0 in | Wt 209.0 lb

## 2013-06-13 DIAGNOSIS — K219 Gastro-esophageal reflux disease without esophagitis: Secondary | ICD-10-CM | POA: Insufficient documentation

## 2013-06-13 DIAGNOSIS — M5136 Other intervertebral disc degeneration, lumbar region: Secondary | ICD-10-CM | POA: Insufficient documentation

## 2013-06-13 DIAGNOSIS — Z23 Encounter for immunization: Secondary | ICD-10-CM

## 2013-06-13 DIAGNOSIS — G4730 Sleep apnea, unspecified: Secondary | ICD-10-CM

## 2013-06-13 DIAGNOSIS — E559 Vitamin D deficiency, unspecified: Secondary | ICD-10-CM | POA: Insufficient documentation

## 2013-06-13 DIAGNOSIS — M5137 Other intervertebral disc degeneration, lumbosacral region: Secondary | ICD-10-CM

## 2013-06-13 DIAGNOSIS — N4889 Other specified disorders of penis: Secondary | ICD-10-CM

## 2013-06-13 DIAGNOSIS — I10 Essential (primary) hypertension: Secondary | ICD-10-CM

## 2013-06-13 DIAGNOSIS — M5030 Other cervical disc degeneration, unspecified cervical region: Secondary | ICD-10-CM | POA: Insufficient documentation

## 2013-06-13 DIAGNOSIS — E042 Nontoxic multinodular goiter: Secondary | ICD-10-CM

## 2013-06-13 DIAGNOSIS — N489 Disorder of penis, unspecified: Secondary | ICD-10-CM

## 2013-06-13 DIAGNOSIS — I2510 Atherosclerotic heart disease of native coronary artery without angina pectoris: Secondary | ICD-10-CM

## 2013-06-13 LAB — POCT CBC
Hemoglobin: 14.2 g/dL (ref 14.1–18.1)
Lymph, poc: 4.9 — AB (ref 0.6–3.4)
MCH, POC: 30.5 pg (ref 27–31.2)
MCHC: 33.4 g/dL (ref 31.8–35.4)
MCV: 91.2 fL (ref 80–97)
MPV: 7.3 fL (ref 0–99.8)
POC LYMPH PERCENT: 34.1 %L (ref 10–50)
RBC: 4.7 M/uL (ref 4.69–6.13)
RDW, POC: 13.7 %
WBC: 14.5 10*3/uL — AB (ref 4.6–10.2)

## 2013-06-13 NOTE — Progress Notes (Signed)
Subjective:    Patient ID: Dustin Dominguez, male    DOB: 04-May-1941, 72 y.o.   MRN: [id number]  HPI Patient returns for follow-up on chronic medical conditions. This patient has multiple problems. He has debilitating chronic back pain and neck pain secondary to severe degenerative disc disease and he is followed by Dr. Annabell Howells is for pain management of this. He also has a history of a multinodular goiter and he recently saw Dr. Derrell Lolling and had an ultrasound. It was told to him that the goiter is stable. He also sees and is due for a visit with the urologist. 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 In addition he has a glans lesion which Dr. Earney Mallet I will have to look at. The patient is also due to followup for his coronary artery disease with Dr. Diona Browner and that visit is coming up soon.   Review of Systems  HENT: Negative.   Eyes: Negative.        Cataracts diagnosed at recent opthalmology appt  Respiratory: Negative.   Cardiovascular: Positive for chest pain (chronic angina) and leg swelling (occasinal). Negative for palpitations.  Gastrointestinal: Positive for constipation (chronic due to pain medication). Negative for nausea, vomiting, abdominal pain, diarrhea, blood in stool, abdominal distention, anal bleeding and rectal pain.  Endocrine: Negative.   Genitourinary: Negative.   Musculoskeletal: Positive for back pain (chronic back pain).  Skin: Negative.   Allergic/Immunologic: Negative.   Neurological: Positive for dizziness (occasionally) and weakness (Patient fell when getting out of bed 6 weeks ago. Leg seemed to have  "went to sleep" during the night. ). Negative for headaches.  Hematological: Negative.   Psychiatric/Behavioral: Negative.        Objective:   Physical Exam  Nursing note and vitals reviewed. Constitutional: He is oriented to person, place, and time. He appears well-developed and well-nourished. No distress.  Overweight, pleasant  HENT:  Head:  Normocephalic and atraumatic.  Right Ear: External ear normal.  Left Ear: External ear normal.  Nose: Nose normal.  Mouth/Throat: Oropharynx is clear and moist. No oropharyngeal exudate.  Eyes: Conjunctivae and EOM are normal. Pupils are equal, round, and reactive to light. Right eye exhibits no discharge. Left eye exhibits no discharge. No scleral icterus.  Neck: Normal range of motion. Neck supple. No thyromegaly present.  Cardiovascular: Normal rate, regular rhythm, normal heart sounds and intact distal pulses.  Exam reveals no gallop.   No murmur heard. At 72 per min  Pulmonary/Chest: Effort normal and breath sounds normal. No respiratory distress. He has no wheezes. He has no rales. He exhibits no tenderness.  Abdominal: Soft. Bowel sounds are normal. He exhibits no distension and no mass. There is no tenderness. There is no rebound and no guarding.  Patient has ventral hernia and muscle relaxation secondary to multiple abdominal surgeries  Genitourinary:  The glans penis has a blister like lesion on it that is not painful  Musculoskeletal: Normal range of motion. He exhibits no edema and no tenderness.  Patient has a kyphotic and scoliosis posture  Lymphadenopathy:    He has no cervical adenopathy.  Neurological: He is alert and oriented to person, place, and time. He has normal reflexes. No cranial nerve deficit.  Skin: Skin is warm and dry. No rash noted. No erythema. No pallor.  See note about glans skin lesion  Psychiatric: He has a normal mood and affect. His behavior is normal. Judgment and thought content normal.  Assessment & Plan:   1. GERD (gastroesophageal reflux disease)   2. Vitamin D deficiency   3. Need for pneumococcal vaccination   4. Penile lesion   5. Sleep apnea   6. Multiple thyroid nodules   7. Essential hypertension, benign   8. Degenerative disc disease, cervical   9. Degenerative disc disease, lumbar   10. Coronary atherosclerosis of  native coronary artery    Orders Placed This Encounter  Procedures  . Pneumococcal conjugate vaccine 13-valent   Patient will also have lab work drawn today. No orders of the defined types were placed in this encounter.     Patient Instructions  Pneumococcal Vaccine, Polyvalent suspension for injection What is this medicine? PNEUMOCOCCAL VACCINE, POLYVALENT (NEU mo KOK al vak SEEN, pol ee VEY luhnt) is a vaccine to prevent pneumococcus bacteria infection. These bacteria are a major cause of ear infections, 'Strep throat' infections, and serious pneumonia, meningitis, or blood infections worldwide. These vaccines help the body to produce antibodies (protective substances) that help your body defend against these bacteria. This vaccine is recommended for infants and young children. This vaccine will not treat an infection. This medicine may be used for other purposes; ask your health care provider or pharmacist if you have questions. COMMON BRAND NAME(S): Prevnar 13 , Prevnar What should I tell my health care provider before I take this medicine? They need to know if you have any of these conditions: -bleeding problems -fever -immune system problems -low platelet count in the blood -seizures -an unusual or allergic reaction to pneumococcal vaccine, diphtheria toxoid, other vaccines, latex, other medicines, foods, dyes, or preservatives -pregnant or trying to get pregnant -breast-feeding How should I use this medicine? This vaccine is for injection into a muscle. It is given by a health care professional. A copy of Vaccine Information Statements will be given before each vaccination. Read this sheet carefully each time. The sheet may change frequently. Talk to your pediatrician regarding the use of this medicine in children. While this drug may be prescribed for children as young as 93 weeks old for selected conditions, precautions do apply. Overdosage: If you think you have taken too much  of this medicine contact a poison control center or emergency room at once. NOTE: This medicine is only for you. Do not share this medicine with others. What if I miss a dose? It is important not to miss your dose. Call your doctor or health care professional if you are unable to keep an appointment. What may interact with this medicine? -medicines for cancer chemotherapy -medicines that suppress your immune function -medicines that treat or prevent blood clots like warfarin, enoxaparin, and dalteparin -steroid medicines like prednisone or cortisone This list may not describe all possible interactions. Give your health care provider a list of all the medicines, herbs, non-prescription drugs, or dietary supplements you use. Also tell them if you smoke, drink alcohol, or use illegal drugs. Some items may interact with your medicine. What should I watch for while using this medicine? Mild fever and pain should go away in 3 days or less. Report any unusual symptoms to your doctor or health care professional. What side effects may I notice from receiving this medicine? Side effects that you should report to your doctor or health care professional as soon as possible: -allergic reactions like skin rash, itching or hives, swelling of the face, lips, or tongue -breathing problems -confused -fever over 102 degrees F -pain, tingling, numbness in the hands or feet -seizures -  unusual bleeding or bruising -unusual muscle weakness Side effects that usually do not require medical attention (report to your doctor or health care professional if they continue or are bothersome): -aches and pains -diarrhea -fever of 102 degrees F or less -headache -irritable -loss of appetite -pain, tender at site where injected -trouble sleeping This list may not describe all possible side effects. Call your doctor for medical advice about side effects. You may report side effects to FDA at 1-800-FDA-1088. Where should  I keep my medicine? This does not apply. This vaccine is given in a clinic, pharmacy, doctor's office, or other health care setting and will not be stored at home. NOTE: This sheet is a summary. It may not cover all possible information. If you have questions about this medicine, talk to your doctor, pharmacist, or health care provider.  2014, Elsevier/Gold Standard. (2008-09-17 10:17:22)   please continue to followup with medical specialists to regarding your multinodular goiter or, your coronary artery disease, your chronic pain, and your prostate and especially with this new lesion that has presented itself on the glans penis Always be careful and do not put yourself at risk for falling You will receive your flu shot and Prevnar today. Lab work will be drawn and we will call you with the results Once they are returned to its    Nyra Capes MD

## 2013-06-13 NOTE — Patient Instructions (Addendum)
Pneumococcal Vaccine, Polyvalent suspension for injection What is this medicine? PNEUMOCOCCAL VACCINE, POLYVALENT (NEU mo KOK al vak SEEN, pol ee VEY luhnt) is a vaccine to prevent pneumococcus bacteria infection. These bacteria are a major cause of ear infections, 'Strep throat' infections, and serious pneumonia, meningitis, or blood infections worldwide. These vaccines help the body to produce antibodies (protective substances) that help your body defend against these bacteria. This vaccine is recommended for infants and young children. This vaccine will not treat an infection. This medicine may be used for other purposes; ask your health care provider or pharmacist if you have questions. COMMON BRAND NAME(S): Prevnar 13 , Prevnar What should I tell my health care provider before I take this medicine? They need to know if you have any of these conditions: -bleeding problems -fever -immune system problems -low platelet count in the blood -seizures -an unusual or allergic reaction to pneumococcal vaccine, diphtheria toxoid, other vaccines, latex, other medicines, foods, dyes, or preservatives -pregnant or trying to get pregnant -breast-feeding How should I use this medicine? This vaccine is for injection into a muscle. It is given by a health care professional. A copy of Vaccine Information Statements will be given before each vaccination. Read this sheet carefully each time. The sheet may change frequently. Talk to your pediatrician regarding the use of this medicine in children. While this drug may be prescribed for children as young as 23 weeks old for selected conditions, precautions do apply. Overdosage: If you think you have taken too much of this medicine contact a poison control center or emergency room at once. NOTE: This medicine is only for you. Do not share this medicine with others. What if I miss a dose? It is important not to miss your dose. Call your doctor or health care  professional if you are unable to keep an appointment. What may interact with this medicine? -medicines for cancer chemotherapy -medicines that suppress your immune function -medicines that treat or prevent blood clots like warfarin, enoxaparin, and dalteparin -steroid medicines like prednisone or cortisone This list may not describe all possible interactions. Give your health care provider a list of all the medicines, herbs, non-prescription drugs, or dietary supplements you use. Also tell them if you smoke, drink alcohol, or use illegal drugs. Some items may interact with your medicine. What should I watch for while using this medicine? Mild fever and pain should go away in 3 days or less. Report any unusual symptoms to your doctor or health care professional. What side effects may I notice from receiving this medicine? Side effects that you should report to your doctor or health care professional as soon as possible: -allergic reactions like skin rash, itching or hives, swelling of the face, lips, or tongue -breathing problems -confused -fever over 102 degrees F -pain, tingling, numbness in the hands or feet -seizures -unusual bleeding or bruising -unusual muscle weakness Side effects that usually do not require medical attention (report to your doctor or health care professional if they continue or are bothersome): -aches and pains -diarrhea -fever of 102 degrees F or less -headache -irritable -loss of appetite -pain, tender at site where injected -trouble sleeping This list may not describe all possible side effects. Call your doctor for medical advice about side effects. You may report side effects to FDA at 1-800-FDA-1088. Where should I keep my medicine? This does not apply. This vaccine is given in a clinic, pharmacy, doctor's office, or other health care setting and will not be stored at  home. NOTE: This sheet is a summary. It may not cover all possible information. If you  have questions about this medicine, talk to your doctor, pharmacist, or health care provider.  2014, Elsevier/Gold Standard. (2008-09-17 10:17:22)   please continue to followup with medical specialists to regarding your multinodular goiter or, your coronary artery disease, your chronic pain, and your prostate and especially with this new lesion that has presented itself on the glans penis Always be careful and do not put yourself at risk for falling You will receive your flu shot and Prevnar today. Lab work will be drawn and we will call you with the results Once they are returned to its

## 2013-06-16 LAB — HEPATIC FUNCTION PANEL
ALT: 13 IU/L (ref 0–44)
AST: 18 IU/L (ref 0–40)
Bilirubin, Direct: 0.16 mg/dL (ref 0.00–0.40)
Total Bilirubin: 0.4 mg/dL (ref 0.0–1.2)

## 2013-06-16 LAB — VITAMIN D 25 HYDROXY (VIT D DEFICIENCY, FRACTURES): Vit D, 25-Hydroxy: 36.4 ng/mL (ref 30.0–100.0)

## 2013-06-16 LAB — THYROID PANEL WITH TSH
T4, Total: 7.6 ug/dL (ref 4.5–12.0)
TSH: 0.553 u[IU]/mL (ref 0.450–4.500)

## 2013-06-16 LAB — BMP8+EGFR
BUN: 14 mg/dL (ref 8–27)
CO2: 26 mmol/L (ref 18–29)
Calcium: 9.5 mg/dL (ref 8.6–10.2)
Chloride: 104 mmol/L (ref 97–108)
Creatinine, Ser: 0.82 mg/dL (ref 0.76–1.27)
GFR calc non Af Amer: 90 mL/min/{1.73_m2} (ref >59)
Glucose: 101 mg/dL — ABNORMAL HIGH (ref 65–99)
Potassium: 5 mmol/L (ref 3.5–5.2)
Sodium: 145 mmol/L — ABNORMAL HIGH (ref 134–144)

## 2013-06-16 LAB — NMR, LIPOPROFILE
Cholesterol: 104 mg/dL (ref <200)
LDL Particle Number: 1431 nmol/L — ABNORMAL HIGH (ref <1000)
LDL Size: 19.5 nm — ABNORMAL LOW (ref >20.5)
LDLC SERPL CALC-MCNC: 30 mg/dL (ref <100)
LP-IR Score: 76 — ABNORMAL HIGH (ref <=45)
Small LDL Particle Number: 1401 nmol/L — ABNORMAL HIGH (ref <=527)
Triglycerides by NMR: 250 mg/dL — ABNORMAL HIGH (ref <150)

## 2013-06-19 ENCOUNTER — Other Ambulatory Visit: Payer: Self-pay | Admitting: Family Medicine

## 2013-06-20 ENCOUNTER — Encounter: Payer: Self-pay | Admitting: Cardiology

## 2013-06-20 ENCOUNTER — Ambulatory Visit: Payer: Medicare Other | Admitting: Cardiology

## 2013-07-02 ENCOUNTER — Telehealth: Payer: Self-pay | Admitting: *Deleted

## 2013-07-02 NOTE — Telephone Encounter (Signed)
Pt notified and verbalized understanding. Will come in this week for repeat CBC. Pt is taking meds regular as well as vit d.

## 2013-07-02 NOTE — Telephone Encounter (Signed)
Message copied by Baltazar Apo on Mon Jul 02, 2013 10:41 AM ------      Message from: Ernestina Penna      Created: Wed Jun 13, 2013  1:28 PM       The CBC has a white blood cell count that is elevated at 14.5, 5 months ago it was 17.3. 14.5 is elevated and this can be dissuaded from infection and from taking prednisone. The hemoglobin is good at 14.2. The platelet count is adequate-------- please repeat the CBC in 2 weeks ------

## 2013-07-02 NOTE — Telephone Encounter (Signed)
Message copied by Baltazar Apo on Mon Jul 02, 2013 10:48 AM ------      Message from: Ernestina Penna      Created: Sat Jun 16, 2013  2:53 PM       The LFTs are within normal limits      The blood sugar is slightly elevated at 101. The creatinine, which is the most important kidney function test was good. Electrolytes including potassium were all good.      On advanced lipid testing, a total LDL particle number was 1431, 5 months ago it was 696. This is much more elevated than before. Please confirm that patient is still taking his regular medication and watching his diet and exercising as much as possible. The triglycerides remain very elevated. They are 250. That should be less than 150.      The vitamin D level is also been 5 months ago when it was 54. This time is 36.4 .-------- please confirm that he is still taking his vitamin D      All thyroid function tests are within normal limits ------

## 2013-07-02 NOTE — Telephone Encounter (Signed)
Pt notified and verbalized understanding. See previous message sent to dr. Christell Constant

## 2013-07-16 ENCOUNTER — Telehealth: Payer: Self-pay | Admitting: Family Medicine

## 2013-07-17 NOTE — Telephone Encounter (Signed)
Samples up front patient aware  

## 2013-08-14 ENCOUNTER — Telehealth: Payer: Self-pay | Admitting: Family Medicine

## 2013-08-15 NOTE — Telephone Encounter (Signed)
Pt notified non samples available at this time.

## 2013-08-17 ENCOUNTER — Ambulatory Visit: Payer: Medicare Other | Admitting: Cardiology

## 2013-08-23 ENCOUNTER — Telehealth: Payer: Self-pay | Admitting: Family Medicine

## 2013-08-25 ENCOUNTER — Other Ambulatory Visit: Payer: Self-pay | Admitting: Family Medicine

## 2013-08-27 NOTE — Telephone Encounter (Signed)
Patient aware.

## 2013-08-28 ENCOUNTER — Other Ambulatory Visit (HOSPITAL_COMMUNITY): Payer: Self-pay | Admitting: Urology

## 2013-08-28 DIAGNOSIS — N400 Enlarged prostate without lower urinary tract symptoms: Secondary | ICD-10-CM

## 2013-08-28 DIAGNOSIS — N390 Urinary tract infection, site not specified: Secondary | ICD-10-CM

## 2013-08-29 ENCOUNTER — Other Ambulatory Visit (HOSPITAL_COMMUNITY): Payer: Self-pay | Admitting: Urology

## 2013-08-29 DIAGNOSIS — N390 Urinary tract infection, site not specified: Secondary | ICD-10-CM

## 2013-08-29 DIAGNOSIS — N400 Enlarged prostate without lower urinary tract symptoms: Secondary | ICD-10-CM

## 2013-08-30 ENCOUNTER — Ambulatory Visit (HOSPITAL_COMMUNITY): Payer: Medicare Other

## 2013-08-31 ENCOUNTER — Other Ambulatory Visit (HOSPITAL_COMMUNITY): Payer: Medicare Other

## 2013-08-31 ENCOUNTER — Ambulatory Visit (HOSPITAL_COMMUNITY): Payer: Medicare Other

## 2013-09-06 ENCOUNTER — Encounter (HOSPITAL_COMMUNITY): Payer: Self-pay | Admitting: Pharmacy Technician

## 2013-09-09 ENCOUNTER — Other Ambulatory Visit: Payer: Self-pay | Admitting: Family Medicine

## 2013-09-10 ENCOUNTER — Encounter (HOSPITAL_COMMUNITY)
Admission: RE | Admit: 2013-09-10 | Discharge: 2013-09-10 | Disposition: A | Discharge status: Home / Self Care | Payer: Medicare Other | Source: Ambulatory Visit | Attending: Urology | Admitting: Urology

## 2013-09-10 ENCOUNTER — Encounter (HOSPITAL_COMMUNITY): Payer: Self-pay

## 2013-09-10 DIAGNOSIS — Z01812 Encounter for preprocedural laboratory examination: Secondary | ICD-10-CM | POA: Insufficient documentation

## 2013-09-10 HISTORY — DX: Unspecified kyphosis, site unspecified: M40.209

## 2013-09-10 HISTORY — DX: Hemorrhagic condition, unspecified: D69.9

## 2013-09-10 HISTORY — DX: Acute myocardial infarction, unspecified: I21.9

## 2013-09-10 LAB — BASIC METABOLIC PANEL
BUN: 17 mg/dL (ref 6–23)
CHLORIDE: 106 meq/L (ref 96–112)
CO2: 31 meq/L (ref 19–32)
Calcium: 10.2 mg/dL (ref 8.4–10.5)
Creatinine, Ser: 0.87 mg/dL (ref 0.50–1.35)
GFR calc non Af Amer: 85 mL/min — ABNORMAL LOW (ref >90)
Glucose, Bld: 125 mg/dL — ABNORMAL HIGH (ref 70–99)
Potassium: 4.9 mEq/L (ref 3.7–5.3)
Sodium: 145 mEq/L (ref 137–147)

## 2013-09-10 LAB — CBC WITH DIFFERENTIAL/PLATELET
Basophils Absolute: 0.1 10*3/uL (ref 0.0–0.1)
Basophils Relative: 1 % (ref 0–1)
EOS PCT: 9 % — AB (ref 0–5)
Eosinophils Absolute: 1 10*3/uL — ABNORMAL HIGH (ref 0.0–0.7)
HEMATOCRIT: 44.3 % (ref 39.0–52.0)
Hemoglobin: 15.2 g/dL (ref 13.0–17.0)
LYMPHS PCT: 37 % (ref 12–46)
Lymphs Abs: 4.4 10*3/uL — ABNORMAL HIGH (ref 0.7–4.0)
MCH: 31 pg (ref 26.0–34.0)
MCHC: 34.3 g/dL (ref 30.0–36.0)
MCV: 90.2 fL (ref 78.0–100.0)
MONOS PCT: 8 % (ref 3–12)
Monocytes Absolute: 0.9 10*3/uL (ref 0.1–1.0)
NEUTROS ABS: 5.4 10*3/uL (ref 1.7–7.7)
Neutrophils Relative %: 45 % (ref 43–77)
Platelets: 343 10*3/uL (ref 150–400)
RBC: 4.91 MIL/uL (ref 4.22–5.81)
RDW: 14.5 % (ref 11.5–15.5)
WBC: 11.9 10*3/uL — AB (ref 4.0–10.5)

## 2013-09-10 NOTE — Patient Instructions (Signed)
Dustin Dominguez  09/10/2013   Your procedure is scheduled on:   09/13/2013  Report to [facility] at  36  AM.  Call this number if you have problems the morning of surgery: [phone]   Remember:   Do not eat food or drink liquids after midnight.   Take these medicines the morning of surgery with A SIP OF WATER:  Accuretic, metoprolol, isosorbide, protonix   Do not wear jewelry, make-up or nail polish.  Do not wear lotions, powders, or perfumes.   Do not shave 48 hours prior to surgery. Men may shave face and neck.  Do not bring valuables to the hospital.  [facility] is not responsible for any belongings or valuables.               Contacts, dentures or bridgework may not be worn into surgery.  Leave suitcase in the car. After surgery it may be brought to your room.  For patients admitted to the hospital, discharge time is determined by your treatment team.               Patients discharged the day of surgery will not be allowed to drive home.  Name and phone number of your driver: family  Special Instructions: Shower using CHG 2 nights before surgery and the night before surgery.  If you shower the day of surgery use CHG.  Use special wash - you have one bottle of CHG for all showers.  You should use approximately 1/3 of the bottle for each shower.   Please read over the following fact sheets that you were given: Pain Booklet, Coughing and Deep Breathing, Surgical Site Infection Prevention, Anesthesia Post-op Instructions and Care and Recovery After Surgery Excision of Skin Lesions Excision of a skin lesion refers to the removal of a section of skin by making small cuts (incisions) in the skin. This is typically done to remove a cancerous growth (basal cell carcinoma, squamous cell carcinoma, or melanoma) or a noncancerous growth (cyst). It may be done to treat or prevent cancer or infection. It may also be done to improve cosmetic appearance (removal of mole, skin tag). LET  YOUR CAREGIVER KNOW ABOUT:   Allergies to food or medicine.  Medicines taken, including vitamins, herbs, eyedrops, over-the-counter medicines, and creams.  Use of steroids (by mouth or creams).  Previous problems with anesthetics or numbing medicines.  History of bleeding problems or blood clots.  History of any prostheses.  Previous surgery.  Other health problems, including diabetes and kidney problems.  Possibility of pregnancy, if this applies. RISKS AND COMPLICATIONS  Many complications can be managed. With appropriate treatment and rehabilitation, the following complications are very uncommon:  Bleeding.  Infection.  Scarring.  Recurrence of cyst or cancer.  Changes in skin sensation or appearance (discoloration, swelling).  Reaction to anesthesia.  Allergic reaction to surgical materials or ointments.  Damage to nerves, blood vessels, muscles, or other structures.  Continued pain. BEFORE THE PROCEDURE  It is important to follow your caregiver's instructions prior to your procedure to avoid complications. Steps before your procedure may include:  Physical exam, blood tests, other procedures, such as removing a small sample for examination under a microscope (biopsy).  Your caregiver may review the procedure, the anesthesia being used, and what to expect after the procedure with you. You may be asked to:  Stop taking certain medicines, such as blood thinners (including aspirin, clopidogrel, ibuprofen), for several days prior to your  procedure.  Take certain medicines.  Stop smoking. It is a good idea to arrange for a ride home after surgery and to have someone to help you with activities during recovery. PROCEDURE  There are several excision techniques. The type of excision or surgical technique used will depend on your condition, the location of the lesion, and your overall health. After the lesion is sterilized and a local anesthetic is applied, the  following may be performed: Complete surgical excision The area to be removed is marked with a pen. Using a small scalpel and scissors, the surgeon gently cuts around and under the lesion until it is completely removed. The lesion is placed in a special fluid and sent to the lab for examination. If necessary, bleeding will be controlled with a device that delivers heat. The edges of the wound are stitched together and a dressing is applied. This procedure may be performed to treat a cancerous growth or noncancerous cyst or lesion. Surgeons commonly perform an elliptical excision, to minimize scarring. Excision of a cyst The surgeon makes an incision on the cyst. The entire cyst is removed through the incision. The wound may be closed with a suture (stitch). Shave excision During shave excision, the surgeon uses a small blade or loop instrument to shave off the lesion. This may be done to remove a mole or skin tag. The wound is usually left to heal on its own without stitches. Punch excision During punch excision, the surgeon uses a small, round tool (like a cookie cutter) to cut a circle shape out of the skin. The outer edges of the skin are stitched together. This may be done to remove a mole or scar or to perform a biopsy of the lesion. Mohs micrographic surgery During Mohs micrographic surgery, layers of the lesion are removed with a scalpel or loop instrument and immediately examined under a microscope until all of the abnormal or cancerous tissue is removed. This procedure is minimally invasive and ensures the best cosmetic outcome, with removal of as little normal tissue as possible. Mohs is usually done to treat skin cancer, such as basal cell carcinoma or squamous cell carcinoma, particularly on the face and ears. Antibiotic ointment is applied to the surgical area after each of the procedures listed above, as necessary. AFTER THE PROCEDURE  How well you heal depends on many factors. Most  patients heal quite well with proper techniques and self-care. Scarring will lessen over time. HOME CARE INSTRUCTIONS   Take medicines for pain as directed.  Keep the incision area clean, dry, and protected for at least 48 hours. Change dressings as directed.  For bleeding, apply gentle but firm pressure to the wound using a folded towel for 20 minutes. Call your caregiver if bleeding does not stop.  Avoid high-impact exercise and activities until the stitches are removed or the area heals.  Follow your caregiver's instructions to minimize scarring. Avoid sun exposure until the area has healed. Scarring should lessen over time.  Follow up with your caregiver as directed. Removal of stitches within 4 to 14 days may be necessary. Finding out the results of your test Not all test results are available during your visit. If your test results are not back during the visit, make an appointment with your caregiver to find out the results. Do not assume everything is normal if you have not heard from your caregiver or the medical facility. It is important for you to follow up on all of your test results.  SEEK MEDICAL CARE IF:   You or your child has an oral temperature above 102 F (38.9 C).  You develop signs of infection (chills, feeling unwell).  You notice bleeding, pain, discharge, redness, or swelling at the incision site.  You notice skin irregularities or changes in sensation. MAKE SURE YOU:   Understand these instructions.  Will watch your condition.  Will get help right away if you are not doing well or get worse. FOR MORE INFORMATION  American Academy [employer]: www.AromatherapyParty.no American Academy of Dermatology: http://jones-macias.info/ Document Released: 09/29/2009 Document Revised: 09/27/2011 Document Reviewed: 09/29/2009 [facility] Patient Information 2014 [place]. PATIENT INSTRUCTIONS POST-ANESTHESIA  IMMEDIATELY FOLLOWING SURGERY:  Do not drive or operate machinery for  the first twenty four hours after surgery.  Do not make any important decisions for twenty four hours after surgery or while taking narcotic pain medications or sedatives.  If you develop intractable nausea and vomiting or a severe headache please notify your doctor immediately.  FOLLOW-UP:  Please make an appointment with your surgeon as instructed. You do not need to follow up with anesthesia unless specifically instructed to do so.  WOUND CARE INSTRUCTIONS (if applicable):  Keep a dry clean dressing on the anesthesia/puncture wound site if there is drainage.  Once the wound has quit draining you may leave it open to air.  Generally you should leave the bandage intact for twenty four hours unless there is drainage.  If the epidural site drains for more than 36-48 hours please call the anesthesia department.  QUESTIONS?:  Please feel free to call your physician or the hospital operator if you have any questions, and they will be happy to assist you.

## 2013-09-10 NOTE — Pre-Procedure Instructions (Signed)
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 Dr Patsey Berthold notified and orders PT/ INR on arrival day of surgery.

## 2013-09-11 ENCOUNTER — Telehealth: Payer: Self-pay | Admitting: Family Medicine

## 2013-09-11 NOTE — Telephone Encounter (Signed)
Patient aware no samples  

## 2013-09-17 ENCOUNTER — Ambulatory Visit (INDEPENDENT_AMBULATORY_CARE_PROVIDER_SITE_OTHER): Payer: Medicare Other | Admitting: Family Medicine

## 2013-09-17 ENCOUNTER — Encounter: Payer: Self-pay | Admitting: Family Medicine

## 2013-09-17 VITALS — BP 107/64 | HR 65 | Temp 99.5°F | Ht 72.0 in | Wt 196.0 lb

## 2013-09-17 DIAGNOSIS — N489 Disorder of penis, unspecified: Secondary | ICD-10-CM

## 2013-09-17 DIAGNOSIS — J069 Acute upper respiratory infection, unspecified: Secondary | ICD-10-CM

## 2013-09-17 DIAGNOSIS — E782 Mixed hyperlipidemia: Secondary | ICD-10-CM

## 2013-09-17 DIAGNOSIS — E1151 Type 2 diabetes mellitus with diabetic peripheral angiopathy without gangrene: Secondary | ICD-10-CM

## 2013-09-17 DIAGNOSIS — E559 Vitamin D deficiency, unspecified: Secondary | ICD-10-CM

## 2013-09-17 DIAGNOSIS — N4889 Other specified disorders of penis: Secondary | ICD-10-CM

## 2013-09-17 DIAGNOSIS — I70209 Unspecified atherosclerosis of native arteries of extremities, unspecified extremity: Secondary | ICD-10-CM

## 2013-09-17 DIAGNOSIS — I10 Essential (primary) hypertension: Secondary | ICD-10-CM

## 2013-09-17 DIAGNOSIS — K219 Gastro-esophageal reflux disease without esophagitis: Secondary | ICD-10-CM

## 2013-09-17 DIAGNOSIS — E1159 Type 2 diabetes mellitus with other circulatory complications: Secondary | ICD-10-CM

## 2013-09-17 LAB — POCT CBC
GRANULOCYTE PERCENT: 41.6 % (ref 37–80)
HEMATOCRIT: 46.1 % (ref 43.5–53.7)
Hemoglobin: 15.1 g/dL (ref 14.1–18.1)
Lymph, poc: 5.6 — AB (ref 0.6–3.4)
MCH, POC: 30.1 pg (ref 27–31.2)
MCHC: 32.8 g/dL (ref 31.8–35.4)
MCV: 91.7 fL (ref 80–97)
MPV: 7.5 fL (ref 0–99.8)
POC Granulocyte: 7.1 — AB (ref 2–6.9)
POC LYMPH %: 41.6 % (ref 10–50)
Platelet Count, POC: 392 10*3/uL (ref 142–424)
RBC: 5 M/uL (ref 4.69–6.13)
RDW, POC: 13.2 %
WBC: 13.5 10*3/uL — AB (ref 4.6–10.2)

## 2013-09-17 LAB — POCT GLYCOSYLATED HEMOGLOBIN (HGB A1C): HEMOGLOBIN A1C: 5.3

## 2013-09-17 MED ORDER — AZITHROMYCIN 250 MG PO TABS: ORAL_TABLET | ORAL | Status: DC

## 2013-09-17 NOTE — Patient Instructions (Addendum)
Medicare Annual Wellness Visit  [facility] and the medical providers at [facility] strive to bring you the best medical care.  In doing so we not only want to address your current medical conditions and concerns but also to detect new conditions early and prevent illness, disease and health-related problems.    Medicare offers a yearly Wellness Visit which allows our clinical staff to assess your need for preventative services including immunizations, lifestyle education, counseling to decrease risk of preventable diseases and screening for fall risk and other medical concerns.    This visit is provided free of charge (no copay) for all Medicare recipients. The clinical pharmacists at [facility] have begun to conduct these Wellness Visits which will also include a thorough review of all your medications.    As you primary medical provider recommend that you make an appointment for your Annual Wellness Visit if you have not done so already this year.  You may set up this appointment before you leave today or you may call back ([phone]) and schedule an appointment.  Please make sure when you call that you mention that you are scheduling your Annual Wellness Visit with the clinical pharmacist so that the appointment may be made for the proper length of time.     Continue current medications. Continue good therapeutic lifestyle changes which include good diet and exercise. Fall precautions discussed with patient. If an FOBT was given today- please return it to our front desk. If you are over 61 years old - you may need Prevnar 3 or the adult Pneumonia vaccine.  Take antibiotic as directed Use saline nose sprays frequently Take Mucinex maximum strength, blue and white, over-the-counter one twice daily with a large glass of water. Take Tylenol for aches pains and fever Return FOBT

## 2013-09-17 NOTE — Addendum Note (Signed)
Addended by: Pollyann Kennedy F on: 09/17/2013 04:24 PM   Modules accepted: Orders

## 2013-09-17 NOTE — Progress Notes (Signed)
Subjective:    Patient ID: Dustin Dominguez, male    DOB: 1940-11-18, 73 y.o.   MRN: [id number]  HPI Pt here for follow up and management of chronic multiple medical problems. Today he does complain of cough and congestion and low-grade fever. The patient is scheduled for a lesion excision from the penis, tomorrow by the urology.       Patient Active Problem List   Diagnosis Date Noted  . GERD (gastroesophageal reflux disease) 06/13/2013  . Vitamin D deficiency 06/13/2013  . Degenerative disc disease, cervical 06/13/2013  . Degenerative disc disease, lumbar 06/13/2013  . Multiple thyroid nodules 04/07/2012  . Mixed hyperlipidemia 01/27/2011  . Coronary atherosclerosis of native coronary artery   . Sleep apnea   . Tobacco use disorder   . Essential hypertension, benign   . 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 amLODipine (NORVASC) 10 MG tablet TAKE 1 TABLET BY MOUTH EVERY DAY  . aspirin 81 MG EC tablet Take 162 mg by mouth daily.   . cholecalciferol (VITAMIN D) 1000 UNITS tablet Take 1,000 Units by mouth 2 (two) times daily.  . diazepam (VALIUM) 10 MG tablet Take 1 tablet (10 mg total) by mouth 2 (two) times daily as needed.  . fenofibrate 160 MG tablet TAKE 1 TABLET BY MOUTH DAILY  . fish oil-omega-3 fatty acids 1000 MG capsule Take 2 g by mouth 2 (two) times daily. Takes 2 tablets twice daily  . glimepiride (AMARYL) 2 MG tablet TAKE 1 TABLET BY MOUTH EVERY DAY  . isosorbide mononitrate (IMDUR) 30 MG 24 hr tablet TAKE 1 TABLET BY MOUTH DAILY  . lactulose (CHRONULAC) 10 GM/15ML solution Take 15 mLs by mouth daily as needed for mild constipation, moderate constipation or severe constipation.   . metoprolol (LOPRESSOR) 50 MG tablet Take 0.5 tablets (25 mg total) by mouth 2 (two) times daily. As directed  . morphine (MSIR) 30 MG tablet Take 30 mg by mouth 3 (three) times  daily.    [facility] oxyCODONE-acetaminophen (PERCOCET) 10-325 MG per tablet Take 1 tablet by mouth 6 (six) times daily.   . pantoprazole (PROTONIX) 40 MG tablet Take 40 mg by mouth 2 (two) times daily.  . quinapril-hydrochlorothiazide (ACCURETIC) 20-12.5 MG per tablet TAKE 1 TABLET BY MOUTH EVERY DAY  . [DISCONTINUED] rosuvastatin (CRESTOR) 20 MG tablet Take 1 tablet (20 mg total) by mouth daily.  . [DISCONTINUED] Vitamin D, Ergocalciferol, (DRISDOL) 50000 UNITS CAPS capsule TAKE 1 CAPSULE BY MOUTH EVERY WEEK.  . [DISCONTINUED] 0.9 %  sodium chloride infusion     Review of Systems  Constitutional: Positive for fever (low grade).  HENT: Positive for congestion.   Eyes: Negative.   Respiratory: Positive for cough.   Cardiovascular: Negative.   Gastrointestinal: Negative.   Endocrine: Negative.   Genitourinary: Negative.   Musculoskeletal: Negative.   Skin: Negative.   Allergic/Immunologic: Negative.   Neurological: Negative.   Hematological: Negative.   Psychiatric/Behavioral: Negative.        Objective:   Physical Exam  Nursing note and vitals reviewed. Constitutional: He is oriented to person, place, and time. He appears well-developed and well-nourished. No distress.  HENT:  Head: Normocephalic and atraumatic.  Right Ear: External ear normal.  Left Ear: External ear normal.  Mouth/Throat: Oropharynx is clear and moist. No oropharyngeal exudate.  Some nasal congestion bilaterally  Eyes: Conjunctivae and EOM are normal. Pupils are equal,  round, and reactive to light. Right eye exhibits no discharge. Left eye exhibits no discharge. No scleral icterus.  Neck: Normal range of motion. Neck supple. No thyromegaly present.  No carotid bruits auscultated  Cardiovascular: Normal rate, regular rhythm, normal heart sounds and intact distal pulses.  Exam reveals no gallop and no friction rub.   No murmur heard. At 72 per minute  Pulmonary/Chest: Effort normal and breath sounds normal. No  respiratory distress. He has no wheezes. He has no rales. He exhibits no tenderness.  Dry cough  Abdominal: Soft. Bowel sounds are normal. He exhibits no mass. There is no tenderness. There is no rebound and no guarding.  Genitourinary: Penis normal.  Vascular lesion on glans penis to be excised tomorrow by urology. The prostate exam and rectal exam were done in February by the urologist according to the patient.  Musculoskeletal: Normal range of motion. He exhibits no edema and no tenderness.  Lymphadenopathy:    He has no cervical adenopathy.  Neurological: He is alert and oriented to person, place, and time. He has normal reflexes.  Skin: Skin is warm and dry. No rash noted. No erythema. No pallor.  Psychiatric: He has a normal mood and affect. His behavior is normal. Judgment and thought content normal.   BP 107/64  Pulse 65  Temp(Src) 99.5 F (37.5 C) (Oral)  Ht 6' (1.829 m)  Wt 196 lb (88.905 kg)  BMI 26.58 kg/m2        Assessment & Plan:  1. Vitamin D deficiency - POCT CBC - Vit D  25 hydroxy (rtn osteoporosis monitoring)  2. Mixed hyperlipidemia - POCT CBC - NMR, lipoprofile  3. GERD (gastroesophageal reflux disease) - POCT CBC  4. Essential hypertension, benign - POCT CBC - BMP8+EGFR - Hepatic function panel  5. 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 URI (upper respiratory infection) - azithromycin (ZITHROMAX) 250 MG tablet; 2 tablets for the first day then 1 daily for 4 days  Dispense: 6 tablet; Refill: 0  7. Penile lesion -Urologist will remove this tomorrow  Patient Instructions                       Medicare Annual Wellness Visit  [facility] and the medical providers at [facility] strive to bring you the best medical care.  In doing so we not only want to address your current medical conditions and concerns but also to detect new  conditions early and prevent illness, disease and health-related problems.    Medicare offers a yearly Wellness Visit which allows our clinical staff to assess your need for preventative services including immunizations, lifestyle education, counseling to decrease risk of preventable diseases and screening for fall risk and other medical concerns.    This visit is provided free of charge (no copay) for all Medicare recipients. The clinical pharmacists at [facility] have begun to conduct these Wellness Visits which will also include a thorough review of all your medications.    As you primary medical provider recommend that you make an appointment for your Annual Wellness Visit if you have not done so already this year.  You may set up this appointment before you leave today or you may call back ([phone]) and schedule an appointment.  Please make sure when you call that you mention that you are scheduling your Annual Wellness Visit with the clinical  pharmacist so that the appointment may be made for the proper length of time.     Continue current medications. Continue good therapeutic lifestyle changes which include good diet and exercise. Fall precautions discussed with patient. If an FOBT was given today- please return it to our front desk. If you are over 16 years old - you may need Prevnar 44 or the adult Pneumonia vaccine.  Take antibiotic as directed Use saline nose sprays frequently Take Mucinex maximum strength, blue and white, over-the-counter one twice daily with a large glass of water. Take Tylenol for aches pains and fever Return FOBT   Arrie Senate MD

## 2013-09-18 LAB — NMR, LIPOPROFILE
CHOLESTEROL: 169 mg/dL (ref <200)
HDL CHOLESTEROL BY NMR: 27 mg/dL — AB (ref >=40)
HDL Particle Number: 24 umol/L — ABNORMAL LOW (ref >=30.5)
LDL Particle Number: 1748 nmol/L — ABNORMAL HIGH (ref <1000)
LDL Size: 19.9 nm — ABNORMAL LOW (ref >20.5)
LDLC SERPL CALC-MCNC: 98 mg/dL (ref <100)
LP-IR Score: 69 — ABNORMAL HIGH (ref <=45)
SMALL LDL PARTICLE NUMBER: 1390 nmol/L — AB (ref <=527)
TRIGLYCERIDES BY NMR: 220 mg/dL — AB (ref <150)

## 2013-09-18 LAB — VITAMIN D 25 HYDROXY (VIT D DEFICIENCY, FRACTURES): Vit D, 25-Hydroxy: 51.4 ng/mL (ref 30.0–100.0)

## 2013-09-18 LAB — MICROALBUMIN, URINE: Microalbumin, Urine: 101.5 ug/mL — ABNORMAL HIGH (ref 0.0–17.0)

## 2013-09-18 LAB — BMP8+EGFR
BUN/Creatinine Ratio: 17 (ref 10–22)
BUN: 15 mg/dL (ref 8–27)
CALCIUM: 9.6 mg/dL (ref 8.6–10.2)
CO2: 24 mmol/L (ref 18–29)
Chloride: 101 mmol/L (ref 97–108)
Creatinine, Ser: 0.9 mg/dL (ref 0.76–1.27)
GFR calc Af Amer: 99 mL/min/{1.73_m2} (ref >59)
GFR calc non Af Amer: 86 mL/min/{1.73_m2} (ref >59)
GLUCOSE: 76 mg/dL (ref 65–99)
Potassium: 3.7 mmol/L (ref 3.5–5.2)
Sodium: 143 mmol/L (ref 134–144)

## 2013-09-18 LAB — HEPATIC FUNCTION PANEL
ALT: 14 IU/L (ref 0–44)
AST: 20 IU/L (ref 0–40)
Albumin: 4.1 g/dL (ref 3.5–4.8)
Alkaline Phosphatase: 55 IU/L (ref 39–117)
BILIRUBIN TOTAL: 0.6 mg/dL (ref 0.0–1.2)
Bilirubin, Direct: 0.21 mg/dL (ref 0.00–0.40)
TOTAL PROTEIN: 7.1 g/dL (ref 6.0–8.5)

## 2013-09-20 ENCOUNTER — Ambulatory Visit (HOSPITAL_COMMUNITY): Payer: Medicare Other | Admitting: Anesthesiology

## 2013-09-20 ENCOUNTER — Ambulatory Visit (HOSPITAL_COMMUNITY)
Admission: RE | Admit: 2013-09-20 | Discharge: 2013-09-20 | Disposition: A | Discharge status: Home / Self Care | Payer: Medicare Other | Source: Ambulatory Visit | Attending: Urology | Admitting: Urology

## 2013-09-20 ENCOUNTER — Encounter (HOSPITAL_COMMUNITY): Payer: Medicare Other | Admitting: Anesthesiology

## 2013-09-20 ENCOUNTER — Encounter (HOSPITAL_COMMUNITY): Payer: Self-pay | Admitting: Emergency Medicine

## 2013-09-20 ENCOUNTER — Encounter (HOSPITAL_COMMUNITY)
Admission: RE | Disposition: A | Discharge status: Home / Self Care | Payer: Self-pay | Source: Ambulatory Visit | Attending: Urology

## 2013-09-20 DIAGNOSIS — F411 Generalized anxiety disorder: Secondary | ICD-10-CM | POA: Insufficient documentation

## 2013-09-20 DIAGNOSIS — E119 Type 2 diabetes mellitus without complications: Secondary | ICD-10-CM | POA: Insufficient documentation

## 2013-09-20 DIAGNOSIS — N480 Leukoplakia of penis: Secondary | ICD-10-CM | POA: Insufficient documentation

## 2013-09-20 DIAGNOSIS — I10 Essential (primary) hypertension: Secondary | ICD-10-CM | POA: Insufficient documentation

## 2013-09-20 DIAGNOSIS — F17200 Nicotine dependence, unspecified, uncomplicated: Secondary | ICD-10-CM | POA: Insufficient documentation

## 2013-09-20 DIAGNOSIS — Z79899 Other long term (current) drug therapy: Secondary | ICD-10-CM | POA: Insufficient documentation

## 2013-09-20 HISTORY — PX: LESION DESTRUCTION: SHX5132

## 2013-09-20 LAB — PROTIME-INR
INR: 1.11 (ref 0.00–1.49)
PROTHROMBIN TIME: 14.1 s (ref 11.6–15.2)

## 2013-09-20 LAB — GLUCOSE, CAPILLARY
GLUCOSE-CAPILLARY: 101 mg/dL — AB (ref 70–99)
GLUCOSE-CAPILLARY: 87 mg/dL (ref 70–99)

## 2013-09-20 SURGERY — DESTRUCTION, LESION, PENIS
Anesthesia: Monitor Anesthesia Care | Site: Penis

## 2013-09-20 MED ORDER — MIDAZOLAM HCL 2 MG/2ML IJ SOLN
INTRAMUSCULAR | Status: AC
Start: 2013-09-20 — End: 2013-09-20
  Filled 2013-09-20: qty 2

## 2013-09-20 MED ORDER — LIDOCAINE HCL (PF) 1 % IJ SOLN
INTRAMUSCULAR | Status: DC | PRN
  Administered 2013-09-20: 3.5 mL

## 2013-09-20 MED ORDER — FENTANYL CITRATE 0.05 MG/ML IJ SOLN
INTRAMUSCULAR | Status: AC
  Filled 2013-09-20: qty 2

## 2013-09-20 MED ORDER — ONDANSETRON HCL 4 MG/2ML IJ SOLN
INTRAMUSCULAR | Status: AC
  Filled 2013-09-20: qty 2

## 2013-09-20 MED ORDER — BACITRACIN-NEOMYCIN-POLYMYXIN 400-5-5000 EX OINT
TOPICAL_OINTMENT | CUTANEOUS | Status: AC
  Filled 2013-09-20: qty 1

## 2013-09-20 MED ORDER — LIDOCAINE HCL (PF) 1 % IJ SOLN
INTRAMUSCULAR | Status: AC
  Filled 2013-09-20: qty 30

## 2013-09-20 MED ORDER — FENTANYL CITRATE 0.05 MG/ML IJ SOLN
25.0000 ug | INTRAMUSCULAR | Status: AC
Start: 2013-09-20 — End: 2013-09-20
  Administered 2013-09-20 (×2): 25 ug via INTRAVENOUS

## 2013-09-20 MED ORDER — LIDOCAINE HCL (CARDIAC) 10 MG/ML IV SOLN
INTRAVENOUS | Status: DC | PRN
  Administered 2013-09-20: 20 mg via INTRAVENOUS

## 2013-09-20 MED ORDER — ONDANSETRON HCL 4 MG/2ML IJ SOLN
4.0000 mg | Freq: Once | INTRAMUSCULAR | Status: AC
  Administered 2013-09-20: 4 mg via INTRAVENOUS

## 2013-09-20 MED ORDER — PROPOFOL 10 MG/ML IV EMUL
INTRAVENOUS | Status: AC
  Filled 2013-09-20: qty 20

## 2013-09-20 MED ORDER — LIDOCAINE HCL (PF) 1 % IJ SOLN
INTRAMUSCULAR | Status: AC
  Filled 2013-09-20: qty 5

## 2013-09-20 MED ORDER — MIDAZOLAM HCL 2 MG/2ML IJ SOLN
1.0000 mg | INTRAMUSCULAR | Status: DC | PRN
  Administered 2013-09-20: 2 mg via INTRAVENOUS

## 2013-09-20 MED ORDER — BACITRACIN-NEOMYCIN-POLYMYXIN 400-5-5000 EX OINT
TOPICAL_OINTMENT | CUTANEOUS | Status: DC | PRN
  Administered 2013-09-20: 1 via TOPICAL

## 2013-09-20 MED ORDER — FENTANYL CITRATE 0.05 MG/ML IJ SOLN
INTRAMUSCULAR | Status: DC | PRN
  Administered 2013-09-20: 25 ug via INTRAVENOUS

## 2013-09-20 MED ORDER — BUPIVACAINE HCL (PF) 0.5 % IJ SOLN
INTRAMUSCULAR | Status: AC
  Filled 2013-09-20: qty 30

## 2013-09-20 MED ORDER — PROPOFOL INFUSION 10 MG/ML OPTIME
INTRAVENOUS | Status: DC | PRN
  Administered 2013-09-20: 125 ug/kg/min via INTRAVENOUS

## 2013-09-20 MED ORDER — SODIUM CHLORIDE 0.9 % IR SOLN
Status: DC | PRN
  Administered 2013-09-20: 1000 mL

## 2013-09-20 MED ORDER — LACTATED RINGERS IV SOLN
INTRAVENOUS | Status: DC
  Administered 2013-09-20: 09:00:00 via INTRAVENOUS

## 2013-09-20 SURGICAL SUPPLY — 21 items
CLOTH BEACON ORANGE TIMEOUT ST (SAFETY) ×2 IMPLANT
COVER LIGHT HANDLE STERIS (MISCELLANEOUS) ×4 IMPLANT
DRESSING COVERLET 3X1 FLEXIBLE (GAUZE/BANDAGES/DRESSINGS) ×1 IMPLANT
GLOVE BIOGEL M 7.0 STRL (GLOVE) ×2 IMPLANT
GLOVE BIOGEL PI IND STRL 7.0 (GLOVE) IMPLANT
GLOVE BIOGEL PI IND STRL 8.5 (GLOVE) IMPLANT
GLOVE BIOGEL PI INDICATOR 7.0 (GLOVE) ×1
GLOVE BIOGEL PI INDICATOR 8.5 (GLOVE) ×1
GLOVE ECLIPSE 6.5 STRL STRAW (GLOVE) ×1 IMPLANT
GLOVE ECLIPSE 8.0 STRL XLNG CF (GLOVE) ×1 IMPLANT
GOWN STRL REUS W/TWL LRG LVL3 (GOWN DISPOSABLE) ×4 IMPLANT
KIT ROOM TURNOVER AP CYSTO (KITS) ×2 IMPLANT
MANIFOLD NEPTUNE II (INSTRUMENTS) ×2 IMPLANT
NS IRRIG 1000ML POUR BTL (IV SOLUTION) ×2 IMPLANT
PACK MINOR (CUSTOM PROCEDURE TRAY) ×2 IMPLANT
PAD ARMBOARD 7.5X6 YLW CONV (MISCELLANEOUS) ×2 IMPLANT
SET BASIN LINEN APH (SET/KITS/TRAYS/PACK) ×2 IMPLANT
SOL PREP PROV IODINE SCRUB 4OZ (MISCELLANEOUS) ×2 IMPLANT
SPONGE GAUZE 4X4 12PLY (GAUZE/BANDAGES/DRESSINGS) ×2 IMPLANT
SUT CHROMIC BN 1/2CIR 4-0 27IN (SUTURE) ×1 IMPLANT
TOWEL OR 17X26 4PK STRL BLUE (TOWEL DISPOSABLE) ×1 IMPLANT

## 2013-09-20 NOTE — Anesthesia Preprocedure Evaluation (Addendum)
Anesthesia Evaluation  Patient identified by MRN, date of birth, ID band Patient awake    Reviewed: Allergy & Precautions, H&P , NPO status , Patient's Chart, lab work & pertinent test results, reviewed documented beta blocker date and time   Airway Mallampati: III TM Distance: >3 FB Neck ROM: Full    Dental  (+) Teeth Intact   Pulmonary sleep apnea , Current Smoker,  breath sounds clear to auscultation        Cardiovascular hypertension, Pt. on medications and Pt. on home beta blockers + CAD, + Past MI, + Cardiac Stents and + Peripheral Vascular Disease Rhythm:Regular Rate:Normal     Neuro/Psych PSYCHIATRIC DISORDERS Anxiety    GI/Hepatic GERD-  Controlled and Medicated,  Endo/Other  diabetes, Type 2, Oral Hypoglycemic Agents  Renal/GU      Musculoskeletal   Abdominal   Peds  Hematology   Anesthesia Other Findings   Reproductive/Obstetrics                          Anesthesia Physical Anesthesia Plan  ASA: III  Anesthesia Plan: MAC   Post-op Pain Management:    Induction: Intravenous  Airway Management Planned: Simple Face Mask  Additional Equipment:   Intra-op Plan:   Post-operative Plan:   Informed Consent: I have reviewed the patients History and Physical, chart, labs and discussed the procedure including the risks, benefits and alternatives for the proposed anesthesia with the patient or authorized representative who has indicated his/her understanding and acceptance.     Plan Discussed with:   Anesthesia Plan Comments:         Anesthesia Quick Evaluation

## 2013-09-20 NOTE — Discharge Instructions (Signed)
Keep it dry and clean if bleeding call me  Follow up appointment Wed 09/26/2013 @ 11:45am PATIENT INSTRUCTIONS POST-ANESTHESIA  IMMEDIATELY FOLLOWING SURGERY:  Do not drive or operate machinery for the first twenty four hours after surgery.  Do not make any important decisions for twenty four hours after surgery or while taking narcotic pain medications or sedatives.  If you develop intractable nausea and vomiting or a severe headache please notify your doctor immediately.  FOLLOW-UP:  Please make an appointment with your surgeon as instructed. You do not need to follow up with anesthesia unless specifically instructed to do so.  WOUND CARE INSTRUCTIONS (if applicable):  Keep a dry clean dressing on the anesthesia/puncture wound site if there is drainage.  Once the wound has quit draining you may leave it open to air.  Generally you should leave the bandage intact for twenty four hours unless there is drainage.  If the epidural site drains for more than 36-48 hours please call the anesthesia department.  QUESTIONS?:  Please feel free to call your physician or the hospital operator if you have any questions, and they will be happy to assist you.

## 2013-09-20 NOTE — Progress Notes (Signed)
No change in H&P on reexamination. 

## 2013-09-20 NOTE — Anesthesia Postprocedure Evaluation (Signed)
Anesthesia Post Note  Patient: Dustin Dominguez  Procedure(s) Performed: Procedure(s) (LRB): EXCISIONAL BX GLANS PENIS (N/A)  Anesthesia type: MAC  Patient location: PACU  Post pain: Pain level controlled  Post assessment: Post-op Vital signs reviewed, Patient's Cardiovascular Status Stable, Respiratory Function Stable, Patent Airway, No signs of Nausea or vomiting and Pain level controlled   Post vital signs: Reviewed and stable  Level of consciousness: awake and alert   Complications: No apparent anesthesia complications

## 2013-09-20 NOTE — Anesthesia Procedure Notes (Signed)
Procedure Name: MAC Date/Time: 09/20/2013 9:35 AM Performed by: Vista Deck Pre-anesthesia Checklist: Patient identified, Emergency Drugs available, Suction available, Timeout performed and Patient being monitored Patient Re-evaluated:Patient Re-evaluated prior to inductionOxygen Delivery Method: Non-rebreather mask
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
## 2013-09-20 NOTE — Transfer of Care (Signed)
Immediate Anesthesia Transfer of Care Note  Patient: Dustin Dominguez  Procedure(s) Performed: Procedure(s) (LRB): EXCISIONAL BX GLANS PENIS (N/A)  Patient Location: PACU  Anesthesia Type: MAC  Level of Consciousness: awake  Airway & Oxygen Therapy: Patient Spontanous Breathing. Nasal cannula  Post-op Assessment: Report given to PACU RN, Post -op Vital signs reviewed and stable and Patient moving all extremities  Post vital signs: Reviewed and stable  Complications: No apparent anesthesia complications

## 2013-09-20 NOTE — Brief Op Note (Signed)
09/20/2013  10:07 AM  PATIENT:  [name]  73 y.o. male  PRE-OPERATIVE DIAGNOSIS:  penile lesion  POST-OPERATIVE DIAGNOSIS:  penile lesion  PROCEDURE:  Procedure(s): EXCISIONAL BX GLANS PENIS (N/A)  SURGEON:  Surgeon(s) and Role:    * Marissa Nestle, MD - Primary  PHYSICIAN ASSISTANT:   ASSISTANTS: none   ANESTHESIA:   MAC  EBL:  Total I/O In: 750 [I.V.:750] Out: 0   BLOOD ADMINISTERED:none  DRAINS: none   LOCAL MEDICATIONS USED:  NONE  SPECIMEN:  Source of Specimen:  glans penis  DISPOSITION OF SPECIMEN:  PATHOLOGY  COUNTS:  YES  TOURNIQUET:  * No tourniquets in log *  DICTATION: .Other Dictation: Dictation Number dictation [id number]  PLAN OF CARE: Discharge to home after PACU  PATIENT DISPOSITION:  PACU - hemodynamically stable.   Delay start of Pharmacological VTE agent (>24hrs) due to surgical blood loss or risk of bleeding:

## 2013-09-21 NOTE — Op Note (Signed)
NAME:  PERLEY, ARTHURS NO.:  MEDICAL RECORD NO.:  [id number]  LOCATION:                                 FACILITY:  PHYSICIAN:  Marissa Nestle, M.D.DATE OF BIRTH:  1941-02-03  DATE OF PROCEDURE: DATE OF DISCHARGE:                              OPERATIVE REPORT   PREOPERATIVE DIAGNOSIS:  Lesion on the glans penis.  POSTOPERATIVE DIAGNOSIS:  Lesion on the glans penis.  PROCEDURE:  Excisional biopsy of the glans penis.  DESCRIPTION OF PROCEDURE:  The patient was placed in supine position. After usual prep and drape, under MAC anesthesia, 1/3 mL of 1% lidocaine was injected under the lesion.  Then, with the help of a knife, it was simply shaved off from the glans penis.  The bleeders were coagulated with cautery.  Then, Neosporin ointment was applied to the effect of the excised area and covered with a Band-Aid.  The patient left the operating room in satisfactory condition.     Marissa Nestle, M.D.     MIJ/MEDQ  D:  09/20/2013  T:  09/21/2013  Job:  [id number]

## 2013-09-24 ENCOUNTER — Encounter (HOSPITAL_COMMUNITY): Payer: Self-pay | Admitting: Urology

## 2013-09-24 ENCOUNTER — Ambulatory Visit (INDEPENDENT_AMBULATORY_CARE_PROVIDER_SITE_OTHER): Payer: Medicare Other | Admitting: Cardiology

## 2013-09-24 VITALS — BP 101/63 | HR 67 | Ht 72.0 in | Wt 194.0 lb

## 2013-09-24 DIAGNOSIS — I10 Essential (primary) hypertension: Secondary | ICD-10-CM

## 2013-09-24 DIAGNOSIS — I2510 Atherosclerotic heart disease of native coronary artery without angina pectoris: Secondary | ICD-10-CM

## 2013-09-24 DIAGNOSIS — E782 Mixed hyperlipidemia: Secondary | ICD-10-CM

## 2013-09-24 DIAGNOSIS — E559 Vitamin D deficiency, unspecified: Secondary | ICD-10-CM

## 2013-09-24 NOTE — Assessment & Plan Note (Signed)
Keep followup with Dr. Laurance Flatten.

## 2013-09-24 NOTE — Patient Instructions (Addendum)
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
## 2013-09-24 NOTE — Assessment & Plan Note (Signed)
Recent lab work reviewed. He continues on Crestor.

## 2013-09-24 NOTE — Assessment & Plan Note (Signed)
Blood pressure is normal today. No change to current regimen, although if he develops any significant hypotension, doses will need to be reviewed.

## 2013-09-24 NOTE — Assessment & Plan Note (Signed)
Multivessel disease status post CABG in 2000. He reports chronic stable dyspnea on exertion and angina. Continue medical therapy, blood pressure and heart rate are well controlled today. He prefers observation without additional followup ischemic testing at this time.

## 2013-10-25 ENCOUNTER — Other Ambulatory Visit: Payer: Self-pay | Admitting: *Deleted

## 2013-10-25 ENCOUNTER — Telehealth: Payer: Self-pay | Admitting: Family Medicine

## 2013-10-25 MED ORDER — DIAZEPAM 10 MG PO TABS: 10.0000 mg | ORAL_TABLET | Freq: Two times a day (BID) | ORAL | Status: DC | PRN

## 2013-10-25 NOTE — Telephone Encounter (Signed)
This is okay to refill 

## 2013-10-25 NOTE — Telephone Encounter (Signed)
Pt aware no samples available . 

## 2013-10-25 NOTE — Telephone Encounter (Signed)
Patient last seen in office on 09-17-13. Rx last filled on 09-25-13. Please advise. If approved please route to Pool A so nurse can phone in to [place]. [phone]

## 2013-10-26 ENCOUNTER — Other Ambulatory Visit: Payer: Self-pay | Admitting: Family Medicine

## 2013-10-26 NOTE — Telephone Encounter (Signed)
Called in.

## 2013-11-12 ENCOUNTER — Other Ambulatory Visit: Payer: Self-pay | Admitting: Family Medicine

## 2013-11-13 ENCOUNTER — Telehealth: Payer: Self-pay | Admitting: Family Medicine

## 2013-11-13 MED ORDER — ROSUVASTATIN CALCIUM 20 MG PO TABS: 20.0000 mg | ORAL_TABLET | Freq: Every day | ORAL | Status: DC

## 2013-11-13 NOTE — Telephone Encounter (Signed)
No samples available at this time. Patient has been out for 3 weeks. Explained that we don't order and stock samples, that they are provided by the drug company and we're never certain when they are coming or how much they will supply. Patient has never had a prescription. There is a savings card available but may not work with his insurance. I explained that I will print out prescription for Dr. Laurance Flatten to sign and he can pickup Rx and savings card tomorrow.  If it is too costly we may need to change to another medication since samples aren't guaranteed.

## 2013-11-19 ENCOUNTER — Other Ambulatory Visit: Payer: Self-pay | Admitting: Family Medicine

## 2013-11-20 ENCOUNTER — Telehealth: Payer: Self-pay | Admitting: Family Medicine

## 2013-11-21 NOTE — Telephone Encounter (Signed)
LM, no samples available.

## 2013-11-21 NOTE — Telephone Encounter (Signed)
No samples available 

## 2013-11-23 ENCOUNTER — Telehealth: Payer: Self-pay | Admitting: Family Medicine

## 2013-11-26 ENCOUNTER — Other Ambulatory Visit: Payer: Self-pay | Admitting: Family Medicine

## 2013-11-26 MED ORDER — ROSUVASTATIN CALCIUM 20 MG PO TABS: 20.0000 mg | ORAL_TABLET | Freq: Every day | ORAL | Status: DC

## 2013-11-26 NOTE — Telephone Encounter (Signed)
Rx sent in

## 2013-11-26 NOTE — Telephone Encounter (Signed)
No samples 

## 2013-12-19 ENCOUNTER — Ambulatory Visit (INDEPENDENT_AMBULATORY_CARE_PROVIDER_SITE_OTHER): Payer: Medicare Other | Admitting: Family Medicine

## 2013-12-19 ENCOUNTER — Encounter: Payer: Self-pay | Admitting: Family Medicine

## 2013-12-19 ENCOUNTER — Other Ambulatory Visit: Payer: Self-pay | Admitting: *Deleted

## 2013-12-19 VITALS — BP 123/64 | HR 55 | Temp 98.7°F | Ht 72.0 in | Wt 194.0 lb

## 2013-12-19 DIAGNOSIS — I70209 Unspecified atherosclerosis of native arteries of extremities, unspecified extremity: Secondary | ICD-10-CM

## 2013-12-19 DIAGNOSIS — I10 Essential (primary) hypertension: Secondary | ICD-10-CM

## 2013-12-19 DIAGNOSIS — E559 Vitamin D deficiency, unspecified: Secondary | ICD-10-CM

## 2013-12-19 DIAGNOSIS — K219 Gastro-esophageal reflux disease without esophagitis: Secondary | ICD-10-CM

## 2013-12-19 DIAGNOSIS — E1151 Type 2 diabetes mellitus with diabetic peripheral angiopathy without gangrene: Secondary | ICD-10-CM

## 2013-12-19 DIAGNOSIS — E782 Mixed hyperlipidemia: Secondary | ICD-10-CM

## 2013-12-19 DIAGNOSIS — E1159 Type 2 diabetes mellitus with other circulatory complications: Secondary | ICD-10-CM

## 2013-12-19 MED ORDER — DIAZEPAM 10 MG PO TABS: 10.0000 mg | ORAL_TABLET | Freq: Two times a day (BID) | ORAL | Status: DC | PRN

## 2013-12-19 NOTE — Telephone Encounter (Signed)
rx called into pharmacy

## 2013-12-19 NOTE — Patient Instructions (Signed)
Medicare Annual Wellness Visit  [facility] and the medical providers at [facility] strive to bring you the best medical care.  In doing so we not only want to address your current medical conditions and concerns but also to detect new conditions early and prevent illness, disease and health-related problems.    Medicare offers a yearly Wellness Visit which allows our clinical staff to assess your need for preventative services including immunizations, lifestyle education, counseling to decrease risk of preventable diseases and screening for fall risk and other medical concerns.    This visit is provided free of charge (no copay) for all Medicare recipients. The clinical pharmacists at [facility] have begun to conduct these Wellness Visits which will also include a thorough review of all your medications.    As you primary medical provider recommend that you make an appointment for your Annual Wellness Visit if you have not done so already this year.  You may set up this appointment before you leave today or you may call back ([phone]) and schedule an appointment.  Please make sure when you call that you mention that you are scheduling your Annual Wellness Visit with the clinical pharmacist so that the appointment may be made for the proper length of time.      Continue current medications. Continue good therapeutic lifestyle changes which include good diet and exercise. Fall precautions discussed with patient. If an FOBT was given today- please return it to our front desk. If you are over 50 years old - you may need Prevnar 13 or the adult Pneumonia vaccine.   

## 2013-12-19 NOTE — Telephone Encounter (Signed)
Last ov 3/15. Last refill 11/22/13.  Call in to [facility] if approved. Route to nursing station A.

## 2013-12-19 NOTE — Telephone Encounter (Signed)
This is okay to refill 

## 2013-12-19 NOTE — Progress Notes (Signed)
Subjective:    Patient ID: Dustin Dominguez, male    DOB: January 10, 1941, 73 y.o.   MRN: [id number]  HPI Pt here for follow up and management of chronic medical problems. The patient is here because of his multiple chronic medical problems. He is also diabetic. His pain medication is managed by Dr. Nelva Bush. He will return to the office for fasting lab work. He is also due to get and return as FOBT. The patient has also had surgery for a glans lesion that was removed by the urologist that was negative for malignancy. He does not check his blood sugars regularly at home and he is very inactive because any activity especially standing ankle creases his pain in his back and his abdomen. He brings a handicap sticker to the son today and this will be signed.        Patient Active Problem List   Diagnosis Date Noted  . GERD (gastroesophageal reflux disease) 06/13/2013  . Vitamin D deficiency 06/13/2013  . Degenerative disc disease, cervical 06/13/2013  . Degenerative disc disease, lumbar 06/13/2013  . Multiple thyroid nodules 04/07/2012  . Mixed hyperlipidemia 01/27/2011  . Coronary atherosclerosis of native coronary artery   . Sleep apnea   . Tobacco use disorder   . Essential hypertension, benign   . 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 amLODipine (NORVASC) 10 MG tablet TAKE 1 TABLET BY MOUTH EVERY DAY  . aspirin 81 MG EC tablet Take 162 mg by mouth daily.   . diazepam (VALIUM) 10 MG tablet Take 1 tablet (10 mg total) by mouth 2 (two) times daily as needed.  . fenofibrate 160 MG tablet TAKE 1 TABLET BY MOUTH DAILY  . fish oil-omega-3 fatty acids 1000 MG capsule Take 2 g by mouth 2 (two) times daily. Takes 2 tablets twice daily  . glimepiride (AMARYL) 2 MG tablet TAKE 1 TABLET BY MOUTH EVERY DAY  . isosorbide mononitrate (IMDUR) 30 MG 24 hr tablet TAKE 1 TABLET BY MOUTH DAILY  .  lactulose (CHRONULAC) 10 GM/15ML solution Take 15 mLs by mouth daily as needed for mild constipation, moderate constipation or severe constipation.   . metoprolol (LOPRESSOR) 50 MG tablet TAKE 1/2 TABLET BY MOUTH TWICE A DAY AS DIRECTED EMERGENCY REFILL FAXED DR  . morphine (MSIR) 30 MG tablet Take 30 mg by mouth 3 (three) times daily.    Marland Kitchen oxyCODONE-acetaminophen (PERCOCET) 10-325 MG per tablet Take 1 tablet by mouth 6 (six) times daily.   . pantoprazole (PROTONIX) 40 MG tablet TAKE 1 TABLET BY MOUTH TWICE DAILY NEEDS PA FOR 2 TABS DAILY LE 08-27-13  . quinapril-hydrochlorothiazide (ACCURETIC) 20-12.5 MG per tablet TAKE 1 TABLET BY MOUTH EVERY DAY  . rosuvastatin (CRESTOR) 20 MG tablet Take 1 tablet (20 mg total) by mouth daily.  . Vitamin D, Ergocalciferol, (DRISDOL) 50000 UNITS CAPS capsule TAKE 1 CAPSULE BY MOUTH EVERY WEEK.  . [DISCONTINUED] diazepam (VALIUM) 10 MG tablet Take 1 tablet (10 mg total) by mouth 2 (two) times daily as needed.  . [DISCONTINUED] pantoprazole (PROTONIX) 40 MG tablet Take 40 mg by mouth 2 (two) times daily.  . [DISCONTINUED] Vitamin D, Ergocalciferol, (DRISDOL) 50000 UNITS CAPS capsule Take 50,000 Units by mouth every 7 (seven) days.    Review of Systems  Constitutional: Negative.   HENT: Negative.   Eyes: Negative.   Respiratory: Negative.   Cardiovascular: Negative.   Gastrointestinal:  Negative.   Endocrine: Negative.   Genitourinary: Negative.   Musculoskeletal: Negative.   Skin: Negative.   Allergic/Immunologic: Negative.   Neurological: Negative.   Hematological: Negative.   Psychiatric/Behavioral: Negative.        Objective:   Physical Exam  Nursing note and vitals reviewed. Constitutional: He is oriented to person, place, and time. He appears well-developed and well-nourished. No distress.  HENT:  Head: Normocephalic and atraumatic.  Right Ear: External ear normal.  Left Ear: External ear normal.  Mouth/Throat: Oropharynx is clear and  moist. No oropharyngeal exudate.  Nasal congestion  Eyes: Conjunctivae and EOM are normal. Pupils are equal, round, and reactive to light. Right eye exhibits no discharge. Left eye exhibits no discharge. No scleral icterus.  Neck: Normal range of motion. Neck supple. No thyromegaly present.  Cardiovascular: Normal rate, regular rhythm, normal heart sounds and intact distal pulses.  Exam reveals no gallop and no friction rub.   No murmur heard. At 60 minute  Pulmonary/Chest: Effort normal and breath sounds normal. No respiratory distress. He has no wheezes. He has no rales. He exhibits no tenderness.  Abdominal: Soft. Bowel sounds are normal. He exhibits no mass. There is tenderness (rt lower quadrant). There is no rebound and no guarding.  Abdominal wall assymmetry secondary to multiple surgeries  Musculoskeletal: He exhibits no edema and no tenderness.  ROM limited to chronic back pain and neck pain  Lymphadenopathy:    He has no cervical adenopathy.  Neurological: He is alert and oriented to person, place, and time. He has normal reflexes.  Skin: Skin is warm and dry. No rash noted. No erythema. No pallor.  Psychiatric: He has a normal mood and affect. His behavior is normal. Judgment and thought content normal.   BP 123/64  Pulse 55  Temp(Src) 98.7 F (37.1 C) (Oral)  Ht 6' (1.829 m)  Wt 194 lb (87.998 kg)  BMI 26.31 kg/m2        Assessment & Plan:  1. Vitamin D deficiency - POCT CBC; Future - Vit D  25 hydroxy (rtn osteoporosis monitoring); Future  2. Mixed hyperlipidemia  POCT CBC; Future - NMR, lipoprofile; Future  3. Essential hypertension, benign - PO   CT CBC; Future - BMP8+EGFR; Future - Hepatic function panel; Future  4. GERD (gastroesophageal reflux disease) - POCT CBC; Future - Hepatic function panel; Future  5. 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    Patient Instructions                       Medicare Annual Wellness Visit  [facility] and the medical providers at [facility] strive to bring you the best medical care.  In doing so we not only want to address your current medical conditions and concerns but also to detect new conditions early and prevent illness, disease and health-related problems.    Medicare offers a yearly Wellness Visit which allows our clinical staff to assess your need for preventative services including immunizations, lifestyle education, counseling to decrease risk of preventable diseases and screening for fall risk and other medical concerns.    This visit is provided free of charge (no copay) for all Medicare recipients. The clinical pharmacists at [facility] have begun to conduct these Wellness Visits which will also include a  thorough review of all your medications.    As you primary medical provider recommend that you make an appointment for your Annual Wellness Visit if you have not done so already this year.  You may set up this appointment before you leave today or you may call back ([phone]) and schedule an appointment.  Please make sure when you call that you mention that you are scheduling your Annual Wellness Visit with the clinical pharmacist so that the appointment may be made for the proper length of time.     Continue current medications. Continue good therapeutic lifestyle changes which include good diet and exercise. Fall precautions discussed with patient. If an FOBT was given today- please return it to our front desk. If you are over 78 years old - you may need Prevnar 46 or the adult Pneumonia vaccine.      Arrie Senate MD

## 2014-01-02 ENCOUNTER — Telehealth: Payer: Self-pay | Admitting: *Deleted

## 2014-01-02 ENCOUNTER — Other Ambulatory Visit (INDEPENDENT_AMBULATORY_CARE_PROVIDER_SITE_OTHER): Payer: Medicare Other

## 2014-01-02 DIAGNOSIS — I10 Essential (primary) hypertension: Secondary | ICD-10-CM

## 2014-01-02 DIAGNOSIS — E559 Vitamin D deficiency, unspecified: Secondary | ICD-10-CM

## 2014-01-02 DIAGNOSIS — E782 Mixed hyperlipidemia: Secondary | ICD-10-CM

## 2014-01-02 DIAGNOSIS — E1151 Type 2 diabetes mellitus with diabetic peripheral angiopathy without gangrene: Secondary | ICD-10-CM

## 2014-01-02 DIAGNOSIS — I70209 Unspecified atherosclerosis of native arteries of extremities, unspecified extremity: Secondary | ICD-10-CM

## 2014-01-02 DIAGNOSIS — E1159 Type 2 diabetes mellitus with other circulatory complications: Secondary | ICD-10-CM

## 2014-01-02 DIAGNOSIS — K219 Gastro-esophageal reflux disease without esophagitis: Secondary | ICD-10-CM

## 2014-01-02 LAB — POCT CBC
Granulocyte percent: 46.9 %G (ref 37–80)
HCT, POC: 44.1 % (ref 43.5–53.7)
HEMOGLOBIN: 14.2 g/dL (ref 14.1–18.1)
LYMPH, POC: 6.2 — AB (ref 0.6–3.4)
MCH: 29.4 pg (ref 27–31.2)
MCHC: 32.2 g/dL (ref 31.8–35.4)
MCV: 91.2 fL (ref 80–97)
MPV: 7.7 fL (ref 0–99.8)
POC Granulocyte: 6.2 (ref 2–6.9)
POC LYMPH PERCENT: 47.2 %L (ref 10–50)
Platelet Count, POC: 281 10*3/uL (ref 142–424)
RBC: 4.8 M/uL (ref 4.69–6.13)
RDW, POC: 13.5 %
WBC: 13.2 10*3/uL — AB (ref 4.6–10.2)

## 2014-01-02 LAB — POCT GLYCOSYLATED HEMOGLOBIN (HGB A1C): Hemoglobin A1C: 5.3

## 2014-01-02 NOTE — Telephone Encounter (Signed)
He was to bring in his blood sugar monitor for [place] to verify the name.Patient has a True Track blood sugar machine.  He says we are to order supplies for this or order a new  Machine with supplies at his pharmacy.

## 2014-01-03 LAB — NMR, LIPOPROFILE
Cholesterol: 120 mg/dL (ref 100–199)
HDL Cholesterol by NMR: 28 mg/dL — ABNORMAL LOW (ref >39)
HDL PARTICLE NUMBER: 32.5 umol/L (ref >=30.5)
LDL Particle Number: 988 nmol/L (ref <1000)
LDL Size: 19.7 nm (ref >20.5)
LDLC SERPL CALC-MCNC: 39 mg/dL (ref 0–99)
LP-IR SCORE: 70 — AB (ref <=45)
Small LDL Particle Number: 788 nmol/L — ABNORMAL HIGH (ref <=527)
Triglycerides by NMR: 263 mg/dL — ABNORMAL HIGH (ref 0–149)

## 2014-01-03 LAB — BMP8+EGFR
BUN / CREAT RATIO: 22 (ref 10–22)
BUN: 17 mg/dL (ref 8–27)
CO2: 21 mmol/L (ref 18–29)
CREATININE: 0.78 mg/dL (ref 0.76–1.27)
Calcium: 9.2 mg/dL (ref 8.6–10.2)
Chloride: 104 mmol/L (ref 97–108)
GFR calc non Af Amer: 91 mL/min/{1.73_m2} (ref >59)
GFR, EST AFRICAN AMERICAN: 105 mL/min/{1.73_m2} (ref >59)
Glucose: 93 mg/dL (ref 65–99)
Potassium: 4.2 mmol/L (ref 3.5–5.2)
Sodium: 143 mmol/L (ref 134–144)

## 2014-01-03 LAB — HEPATIC FUNCTION PANEL
ALBUMIN: 3.8 g/dL (ref 3.5–4.8)
ALK PHOS: 39 IU/L (ref 39–117)
ALT: 15 IU/L (ref 0–44)
AST: 19 IU/L (ref 0–40)
BILIRUBIN DIRECT: 0.2 mg/dL (ref 0.00–0.40)
BILIRUBIN TOTAL: 0.5 mg/dL (ref 0.0–1.2)
Total Protein: 6 g/dL (ref 6.0–8.5)

## 2014-01-03 LAB — VITAMIN D 25 HYDROXY (VIT D DEFICIENCY, FRACTURES): VIT D 25 HYDROXY: 32.5 ng/mL (ref 30.0–100.0)

## 2014-01-04 MED ORDER — BLOOD GLUCOSE METER KIT: PACK | Status: DC

## 2014-01-04 NOTE — Addendum Note (Signed)
Addended by: Zannie Cove on: 01/04/2014 10:00 AM   Modules accepted: Orders

## 2014-01-04 NOTE — Telephone Encounter (Signed)
New kit rx sent to [facility]

## 2014-01-17 ENCOUNTER — Other Ambulatory Visit: Payer: Self-pay | Admitting: Family Medicine

## 2014-01-21 ENCOUNTER — Other Ambulatory Visit: Payer: Self-pay | Admitting: Family Medicine

## 2014-02-13 ENCOUNTER — Other Ambulatory Visit: Payer: Self-pay | Admitting: Family Medicine

## 2014-02-14 NOTE — Telephone Encounter (Signed)
Patient last seen in office on 12-19-13. Rx last filled on 01-17-14 for #[id number]. Please advise If approved please route to Pool A so nurse can call in to pharmacy

## 2014-02-14 NOTE — Telephone Encounter (Signed)
This is okay to refill 

## 2014-02-18 NOTE — Telephone Encounter (Signed)
Phoned in to pharmacy. 

## 2014-03-06 ENCOUNTER — Telehealth: Payer: Self-pay | Admitting: Family Medicine

## 2014-03-06 NOTE — Telephone Encounter (Signed)
Out of samples Patient aware

## 2014-03-11 ENCOUNTER — Other Ambulatory Visit: Payer: Self-pay | Admitting: Family Medicine

## 2014-03-12 ENCOUNTER — Encounter (HOSPITAL_COMMUNITY): Payer: Self-pay | Admitting: Pharmacy Technician

## 2014-03-15 ENCOUNTER — Other Ambulatory Visit: Payer: Self-pay

## 2014-03-15 NOTE — Telephone Encounter (Signed)
Last seen 12/19/13  [facility]  If approved route to nurse to call into [facility] [phone]

## 2014-03-16 MED ORDER — DIAZEPAM 10 MG PO TABS: 10.0000 mg | ORAL_TABLET | Freq: Two times a day (BID) | ORAL | Status: DC

## 2014-03-16 NOTE — Telephone Encounter (Signed)
This is okay to refill x3 mo

## 2014-03-18 NOTE — Telephone Encounter (Signed)
Called in.

## 2014-03-22 NOTE — Patient Instructions (Signed)
Dustin Dominguez  03/22/2014   Your procedure is scheduled on:  04/01/14  Report to [facility] at 6:15 AM.  Call this number if you have problems the morning of surgery: [phone]   Remember:   Do not eat food or drink liquids after midnight.   Take these medicines the morning of surgery with A SIP OF WATER:   Amlodipine, Valium, Imdur, Metoprolol, Morphine, Oxcodone, Protonix   Do not wear jewelry, make-up or nail polish.  Do not wear lotions, powders, or perfumes. You may wear deodorant.  Do not shave 48 hours prior to surgery. Men may shave face and neck.  Do not bring valuables to the hospital.  [facility] is not responsible for any belongings or  valuables.               Contacts, dentures or bridgework may not be worn into surgery.  Leave suitcase in the car. After surgery it may be brought to your room.  For patients admitted to the hospital, discharge time is determined by your treatment team.               Patients discharged the day of surgery will not be allowed to drive home.  Name and phone number of your driver:   Special Instructions: N/A   Please read over the following fact sheets that you were given: Anesthesia Post-op Instructions   PATIENT INSTRUCTIONS POST-ANESTHESIA  IMMEDIATELY FOLLOWING SURGERY:  Do not drive or operate machinery for the first twenty four hours after surgery.  Do not make any important decisions for twenty four hours after surgery or while taking narcotic pain medications or sedatives.  If you develop intractable nausea and vomiting or a severe headache please notify your doctor immediately.  FOLLOW-UP:  Please make an appointment with your surgeon as instructed. You do not need to follow up with anesthesia unless specifically instructed to do so.  WOUND CARE INSTRUCTIONS (if applicable):  Keep a dry clean dressing on the anesthesia/puncture wound site if there is drainage.  Once the wound has quit draining you may leave it open to air.   Generally you should leave the bandage intact for twenty four hours unless there is drainage.  If the epidural site drains for more than 36-48 hours please call the anesthesia department.  QUESTIONS?:  Please feel free to call your physician or the hospital operator if you have any questions, and they will be happy to assist you.      Cataract A cataract is a clouding of the lens of the eye. When a lens becomes cloudy, vision is reduced based on the degree and nature of the clouding. Many cataracts reduce vision to some degree. Some cataracts make people more near-sighted as they develop. Other cataracts increase glare. Cataracts that are ignored and become worse can sometimes look white. The white color can be seen through the pupil. CAUSES   Aging. However, cataracts may occur at any age, even in newborns.  Certain drugs.  Trauma to the eye.  Certain diseases such as diabetes.  Specific eye diseases such as chronic inflammation inside the eye or a sudden attack of a rare form of glaucoma.  Inherited or acquired medical problems. SYMPTOMS   Gradual, progressive drop in vision in the affected eye.  Severe, rapid visual loss. This most often happens when trauma is the cause. DIAGNOSIS  To detect a cataract, an eye doctor examines the lens. Cataracts are best diagnosed with an exam of the eyes  with the pupils enlarged (dilated) by drops.  TREATMENT  For an early cataract, vision may improve by using different eyeglasses or stronger lighting. If that does not help your vision, surgery is the only effective treatment. A cataract needs to be surgically removed when vision loss interferes with your everyday activities, such as driving, reading, or watching TV. A cataract may also have to be removed if it prevents examination or treatment of another eye problem. Surgery removes the cloudy lens and usually replaces it with a substitute lens (intraocular lens, IOL).  At a time when both you and  your doctor agree, the cataract will be surgically removed. If you have cataracts in both eyes, only one is usually removed at a time. This allows the operated eye to heal and be out of danger from any possible problems after surgery (such as infection or poor wound healing). In rare cases, a cataract may be doing damage to your eye. In these cases, your caregiver may advise surgical removal right away. The vast majority of people who have cataract surgery have better vision afterward. HOME CARE INSTRUCTIONS  If you are not planning surgery, you may be asked to do the following:  Use different eyeglasses.  Use stronger or brighter lighting.  Ask your eye doctor about reducing your medicine dose or changing medicines if it is thought that a medicine caused your cataract. Changing medicines does not make the cataract go away on its own.  Become familiar with your surroundings. Poor vision can lead to injury. Avoid bumping into things on the affected side. You are at a higher risk for tripping or falling.  Exercise extreme care when driving or operating machinery.  Wear sunglasses if you are sensitive to bright light or experiencing problems with glare. SEEK IMMEDIATE MEDICAL CARE IF:   You have a worsening or sudden vision loss.  You notice redness, swelling, or increasing pain in the eye.  You have a fever. Document Released: 07/05/2005 Document Revised: 09/27/2011 Document Reviewed: 02/26/2011 [facility] Patient Information 2015 [place], [place]. This information is not intended to replace advice given to you by your health care provider. Make sure you discuss any questions you have with your health care provider.

## 2014-03-26 ENCOUNTER — Encounter (HOSPITAL_COMMUNITY)
Admission: RE | Admit: 2014-03-26 | Discharge: 2014-03-26 | Disposition: A | Discharge status: Home / Self Care | Payer: Medicare Other | Source: Ambulatory Visit | Attending: Ophthalmology | Admitting: Ophthalmology

## 2014-03-26 ENCOUNTER — Encounter (HOSPITAL_COMMUNITY): Payer: Self-pay

## 2014-03-26 DIAGNOSIS — Z01818 Encounter for other preprocedural examination: Secondary | ICD-10-CM | POA: Insufficient documentation

## 2014-03-26 DIAGNOSIS — H259 Unspecified age-related cataract: Secondary | ICD-10-CM | POA: Insufficient documentation

## 2014-03-26 HISTORY — DX: Benign prostatic hyperplasia without lower urinary tract symptoms: N40.0

## 2014-03-26 HISTORY — DX: Dorsalgia, unspecified: M54.9

## 2014-03-26 HISTORY — DX: Other chronic pain: G89.29

## 2014-03-26 HISTORY — DX: Pure hypercholesterolemia, unspecified: E78.00

## 2014-03-26 LAB — BASIC METABOLIC PANEL
ANION GAP: 12 (ref 5–15)
BUN: 14 mg/dL (ref 6–23)
CHLORIDE: 102 meq/L (ref 96–112)
CO2: 29 mEq/L (ref 19–32)
CREATININE: 0.82 mg/dL (ref 0.50–1.35)
Calcium: 9.1 mg/dL (ref 8.4–10.5)
GFR calc Af Amer: >90 mL/min (ref >90)
GFR calc non Af Amer: 87 mL/min — ABNORMAL LOW (ref >90)
Glucose, Bld: 97 mg/dL (ref 70–99)
Potassium: 3.4 mEq/L — ABNORMAL LOW (ref 3.7–5.3)
SODIUM: 143 meq/L (ref 137–147)

## 2014-03-26 LAB — HEMOGLOBIN AND HEMATOCRIT, BLOOD
HEMATOCRIT: 37 % — AB (ref 39.0–52.0)
Hemoglobin: 13.3 g/dL (ref 13.0–17.0)

## 2014-03-26 NOTE — Pre-Procedure Instructions (Signed)
Patient given information to sign up for my chart at home. 

## 2014-03-29 ENCOUNTER — Encounter: Payer: Self-pay | Admitting: *Deleted

## 2014-04-08 ENCOUNTER — Ambulatory Visit (INDEPENDENT_AMBULATORY_CARE_PROVIDER_SITE_OTHER): Payer: Medicare Other | Admitting: Family Medicine

## 2014-04-08 ENCOUNTER — Ambulatory Visit: Payer: Medicare Other | Admitting: Family Medicine

## 2014-04-08 ENCOUNTER — Encounter: Payer: Self-pay | Admitting: Family Medicine

## 2014-04-08 ENCOUNTER — Other Ambulatory Visit: Payer: Self-pay | Admitting: Family Medicine

## 2014-04-08 VITALS — BP 103/64 | HR 61 | Temp 98.3°F | Ht 71.0 in | Wt 188.0 lb

## 2014-04-08 DIAGNOSIS — E1159 Type 2 diabetes mellitus with other circulatory complications: Secondary | ICD-10-CM

## 2014-04-08 DIAGNOSIS — K219 Gastro-esophageal reflux disease without esophagitis: Secondary | ICD-10-CM

## 2014-04-08 DIAGNOSIS — E782 Mixed hyperlipidemia: Secondary | ICD-10-CM

## 2014-04-08 DIAGNOSIS — E1151 Type 2 diabetes mellitus with diabetic peripheral angiopathy without gangrene: Secondary | ICD-10-CM

## 2014-04-08 DIAGNOSIS — I70209 Unspecified atherosclerosis of native arteries of extremities, unspecified extremity: Secondary | ICD-10-CM

## 2014-04-08 DIAGNOSIS — R059 Cough, unspecified: Secondary | ICD-10-CM

## 2014-04-08 DIAGNOSIS — E559 Vitamin D deficiency, unspecified: Secondary | ICD-10-CM

## 2014-04-08 DIAGNOSIS — R05 Cough: Secondary | ICD-10-CM

## 2014-04-08 DIAGNOSIS — I10 Essential (primary) hypertension: Secondary | ICD-10-CM

## 2014-04-08 DIAGNOSIS — J069 Acute upper respiratory infection, unspecified: Secondary | ICD-10-CM

## 2014-04-08 LAB — POCT CBC
GRANULOCYTE PERCENT: 60.7 % (ref 37–80)
HCT, POC: 41 % — AB (ref 43.5–53.7)
Hemoglobin: 14 g/dL — AB (ref 14.1–18.1)
Lymph, poc: 5.2 — AB (ref 0.6–3.4)
MCH, POC: 30.9 pg (ref 27–31.2)
MCHC: 34.1 g/dL (ref 31.8–35.4)
MCV: 90.5 fL (ref 80–97)
MPV: 7.3 fL (ref 0–99.8)
PLATELET COUNT, POC: 427 10*3/uL — AB (ref 142–424)
POC Granulocyte: 9 — AB (ref 2–6.9)
POC LYMPH %: 34.8 % (ref 10–50)
RBC: 4.5 M/uL — AB (ref 4.69–6.13)
RDW, POC: 13.3 %
WBC: 14.9 10*3/uL — AB (ref 4.6–10.2)

## 2014-04-08 LAB — POCT GLYCOSYLATED HEMOGLOBIN (HGB A1C): HEMOGLOBIN A1C: 4.9

## 2014-04-08 NOTE — Progress Notes (Signed)
Subjective:    Patient ID: Dustin Dominguez, male    DOB: Apr 09, 1941, 73 y.o.   MRN: [id number]  HPI Pt here for follow up and management of chronic medical problems. The patient has had increased congestion cold and cough for 2 weeks. He has an FOBT at home but she will return. He sees the pain clinic for management of his severe cervical and lumbar pain. He has seen the urologist a few months ago. He also had a recent bout with an intestinal virus and is feeling better from this.        Patient Active Problem List   Diagnosis Date Noted  . GERD (gastroesophageal reflux disease) 06/13/2013  . Vitamin D deficiency 06/13/2013  . Degenerative disc disease, cervical 06/13/2013  . Degenerative disc disease, lumbar 06/13/2013  . Multiple thyroid nodules 04/07/2012  . Mixed hyperlipidemia 01/27/2011  . Coronary atherosclerosis of native coronary artery   . Sleep apnea   . Tobacco use disorder   . Essential hypertension, benign   . 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 amLODipine (NORVASC) 10 MG tablet Take 10 mg by mouth daily.  Marland Kitchen aspirin 81 MG EC tablet Take 162 mg by mouth daily.   Marland Kitchen BESIVANCE 0.6 % SUSP   . Blood Glucose Monitoring Suppl (BLOOD GLUCOSE METER KIT AND SUPPLIES) Dispense based on patient and insurance preference. Use up to three times daily as directed. (FOR ICD-9 250.00, 250.01).dx- 250.0  . diazepam (VALIUM) 10 MG tablet Take 1 tablet (10 mg total) by mouth 2 (two) times daily.  . fenofibrate 160 MG tablet Take 160 mg by mouth daily.  . fish oil-omega-3 fatty acids 1000 MG capsule Take 2 g by mouth 2 (two) times daily. Takes 2 tablets twice daily  . glimepiride (AMARYL) 2 MG tablet Take 1 mg by mouth daily with breakfast.  . isosorbide mononitrate (IMDUR) 30 MG 24 hr tablet Take 30 mg by mouth daily.  Marland Kitchen lactulose (CHRONULAC) 10 GM/15ML solution Take 15 mLs  by mouth daily as needed for mild constipation, moderate constipation or severe constipation.   . metoprolol (LOPRESSOR) 50 MG tablet Take 25 mg by mouth 2 (two) times daily.  Marland Kitchen morphine (MSIR) 30 MG tablet Take 30 mg by mouth 3 (three) times daily.    Marland Kitchen oxyCODONE-acetaminophen (PERCOCET) 10-325 MG per tablet Take 1 tablet by mouth 6 (six) times daily.   . pantoprazole (PROTONIX) 40 MG tablet Take 40 mg by mouth 2 (two) times daily.  . prednisoLONE acetate (PRED FORTE) 1 % ophthalmic suspension   . PROLENSA 0.07 % SOLN   . quinapril-hydrochlorothiazide (ACCURETIC) 20-12.5 MG per tablet Take 1 tablet by mouth daily.  . rosuvastatin (CRESTOR) 20 MG tablet Take 20 mg by mouth daily.  . Vitamin D, Ergocalciferol, (DRISDOL) 50000 UNITS CAPS capsule Take 50,000 Units by mouth once a week. Wednesday    Review of Systems  Constitutional: Negative.   HENT: Positive for congestion (x 2 weeks).   Eyes: Negative.   Respiratory: Positive for cough.   Cardiovascular: Negative.   Gastrointestinal: Negative.   Endocrine: Negative.   Genitourinary: Negative.   Musculoskeletal: Negative.   Skin: Negative.   Allergic/Immunologic: Negative.   Neurological: Negative.   Hematological: Negative.   Psychiatric/Behavioral: Negative.        Objective:   Physical Exam  Nursing note and vitals reviewed. Constitutional: He is oriented to person,  place, and time. No distress.  The patient is alert, kyphotic and somewhat older appearing than his stated age.  HENT:  Head: Normocephalic and atraumatic.  Right Ear: External ear normal.  Left Ear: External ear normal.  Nose: Nose normal.  Mouth/Throat: Oropharynx is clear and moist. No oropharyngeal exudate.  Eyes: Conjunctivae and EOM are normal. Pupils are equal, round, and reactive to light. Right eye exhibits no discharge. Left eye exhibits no discharge. No scleral icterus.  Neck: Normal range of motion. Neck supple. No thyromegaly present.  No carotid  bruits  Cardiovascular: Normal rate, regular rhythm, normal heart sounds and intact distal pulses.  Exam reveals no gallop and no friction rub.   No murmur heard. At 72 per minute  Pulmonary/Chest: Effort normal and breath sounds normal. No respiratory distress. He has no wheezes. He has no rales. He exhibits no tenderness.  Lungs sounds are clear anteriorly and posteriorly with minimal congestion with coughing  Abdominal: Soft. Bowel sounds are normal. He exhibits no mass. There is no tenderness. There is no rebound and no guarding.  Multiple abdominal scars from previous surgeries  Musculoskeletal: Normal range of motion. He exhibits no edema and no tenderness.  Lymphadenopathy:    He has no cervical adenopathy.  Neurological: He is alert and oriented to person, place, and time. He has normal reflexes. No cranial nerve deficit.  Skin: Skin is warm and dry. No rash noted. No erythema. No pallor.  Psychiatric: He has a normal mood and affect. His behavior is normal. Judgment and thought content normal.   BP 103/64  Pulse 61  Temp(Src) 98.3 F (36.8 C) (Oral)  Ht 5' 11" (1.803 m)  Wt 188 lb (85.276 kg)  BMI 26.23 kg/m2        Assessment & Plan:  1. 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 Essential hypertension, benign - POCT CBC - BMP8+EGFR - Hepatic function panel  3. Gastroesophageal reflux disease, esophagitis presence not specified - POCT CBC  4. Mixed hyperlipidemia - POCT CBC - NMR, lipoprofile  5. Vitamin D deficiency - POCT CBC - Vit D  25 hydroxy (rtn osteoporosis monitoring)  6. Cough  7. URI (upper respiratory infection)  Patient Instructions                       Medicare Annual Wellness Visit  [facility] and the medical providers at [facility] strive to bring you the best medical care.  In doing so we not only want to address your current  medical conditions and concerns but also to detect new conditions early and prevent illness, disease and health-related problems.    Medicare offers a yearly Wellness Visit which allows our clinical staff to assess your need for preventative services including immunizations, lifestyle education, counseling to decrease risk of preventable diseases and screening for fall risk and other medical concerns.    This visit is provided free of charge (no copay) for all Medicare recipients. The clinical pharmacists at [facility] have begun to conduct these Wellness Visits which will also include a thorough review of all your medications.    As you primary medical provider recommend that you make an appointment for your Annual Wellness Visit if you have not done so already this year.  You may set up this appointment before you leave today or you may call back ([phone]) and schedule  an appointment.  Please make sure when you call that you mention that you are scheduling your Annual Wellness Visit with the clinical pharmacist so that the appointment may be made for the proper length of time.     Continue current medications. Continue good therapeutic lifestyle changes which include good diet and exercise. Fall precautions discussed with patient. If an FOBT was given today- please return it to our front desk. If you are over 57 years old - you may need Prevnar 30 or the adult Pneumonia vaccine.  Flu Shots will be available at our office starting mid- September. Please call and schedule a FLU CLINIC APPOINTMENT.   Continue to drink plenty of fluids Take Mucinex, maximum strength, blue and white in color, over-the-counter, with a large glass of water or cough and congestion Return the FOBT take Tylenol if needed for aches pains and fever We will call you or your lab work results as soon as those results are available    Arrie Senate MD

## 2014-04-08 NOTE — Patient Instructions (Addendum)
Medicare Annual Wellness Visit  [facility] and the medical providers at [facility] strive to bring you the best medical care.  In doing so we not only want to address your current medical conditions and concerns but also to detect new conditions early and prevent illness, disease and health-related problems.    Medicare offers a yearly Wellness Visit which allows our clinical staff to assess your need for preventative services including immunizations, lifestyle education, counseling to decrease risk of preventable diseases and screening for fall risk and other medical concerns.    This visit is provided free of charge (no copay) for all Medicare recipients. The clinical pharmacists at [facility] have begun to conduct these Wellness Visits which will also include a thorough review of all your medications.    As you primary medical provider recommend that you make an appointment for your Annual Wellness Visit if you have not done so already this year.  You may set up this appointment before you leave today or you may call back ([phone]) and schedule an appointment.  Please make sure when you call that you mention that you are scheduling your Annual Wellness Visit with the clinical pharmacist so that the appointment may be made for the proper length of time.     Continue current medications. Continue good therapeutic lifestyle changes which include good diet and exercise. Fall precautions discussed with patient. If an FOBT was given today- please return it to our front desk. If you are over 13 years old - you may need Prevnar 4 or the adult Pneumonia vaccine.  Flu Shots will be available at our office starting mid- September. Please call and schedule a FLU CLINIC APPOINTMENT.   Continue to drink plenty of fluids Take Mucinex, maximum strength, blue and white in color, over-the-counter, with a large glass of water or cough and  congestion Return the FOBT take Tylenol if needed for aches pains and fever We will call you or your lab work results as soon as those results are available

## 2014-04-09 ENCOUNTER — Ambulatory Visit: Payer: Medicare Other | Admitting: Family Medicine

## 2014-04-09 LAB — HEPATIC FUNCTION PANEL
ALT: 11 IU/L (ref 0–44)
AST: 18 IU/L (ref 0–40)
Albumin: 3.9 g/dL (ref 3.5–4.8)
Alkaline Phosphatase: 48 IU/L (ref 39–117)
Bilirubin, Direct: 0.18 mg/dL (ref 0.00–0.40)
Total Bilirubin: 0.5 mg/dL (ref 0.0–1.2)
Total Protein: 6.5 g/dL (ref 6.0–8.5)

## 2014-04-09 LAB — BMP8+EGFR
BUN/Creatinine Ratio: 13 (ref 10–22)
BUN: 12 mg/dL (ref 8–27)
CHLORIDE: 100 mmol/L (ref 97–108)
CO2: 25 mmol/L (ref 18–29)
Calcium: 10 mg/dL (ref 8.6–10.2)
Creatinine, Ser: 0.96 mg/dL (ref 0.76–1.27)
GFR calc Af Amer: 92 mL/min/{1.73_m2} (ref >59)
GFR calc non Af Amer: 79 mL/min/{1.73_m2} (ref >59)
GLUCOSE: 76 mg/dL (ref 65–99)
Potassium: 4.7 mmol/L (ref 3.5–5.2)
SODIUM: 143 mmol/L (ref 134–144)

## 2014-04-09 LAB — NMR, LIPOPROFILE
Cholesterol: 115 mg/dL (ref 100–199)
HDL Cholesterol by NMR: 25 mg/dL — ABNORMAL LOW (ref >39)
HDL Particle Number: 24.9 umol/L — ABNORMAL LOW (ref >=30.5)
LDL PARTICLE NUMBER: 860 nmol/L (ref <1000)
LDL SIZE: 19.7 nm (ref >20.5)
LDLC SERPL CALC-MCNC: 56 mg/dL (ref 0–99)
LP-IR Score: 62 — ABNORMAL HIGH (ref <=45)
Small LDL Particle Number: 686 nmol/L — ABNORMAL HIGH (ref <=527)
TRIGLYCERIDES BY NMR: 169 mg/dL — AB (ref 0–149)

## 2014-04-09 LAB — VITAMIN D 25 HYDROXY (VIT D DEFICIENCY, FRACTURES): Vit D, 25-Hydroxy: 44.8 ng/mL (ref 30.0–100.0)

## 2014-04-11 ENCOUNTER — Encounter (HOSPITAL_COMMUNITY)
Admission: RE | Admit: 2014-04-11 | Discharge: 2014-04-11 | Disposition: A | Discharge status: Home / Self Care | Payer: Medicare Other | Source: Ambulatory Visit | Attending: Ophthalmology | Admitting: Ophthalmology

## 2014-04-11 ENCOUNTER — Encounter (HOSPITAL_COMMUNITY): Payer: Self-pay | Admitting: *Deleted

## 2014-04-17 MED ORDER — KETOROLAC TROMETHAMINE 0.5 % OP SOLN: 1.0000 [drp] | OPHTHALMIC | Status: DC

## 2014-04-18 ENCOUNTER — Ambulatory Visit (HOSPITAL_COMMUNITY)
Admission: RE | Admit: 2014-04-18 | Discharge: 2014-04-18 | Disposition: A | Discharge status: Home / Self Care | Payer: Medicare Other | Source: Ambulatory Visit | Attending: Ophthalmology | Admitting: Ophthalmology

## 2014-04-18 ENCOUNTER — Encounter (HOSPITAL_COMMUNITY): Payer: Medicare Other | Admitting: Anesthesiology

## 2014-04-18 ENCOUNTER — Encounter (HOSPITAL_COMMUNITY)
Admission: RE | Disposition: A | Discharge status: Home / Self Care | Payer: Self-pay | Source: Ambulatory Visit | Attending: Ophthalmology

## 2014-04-18 ENCOUNTER — Encounter (HOSPITAL_COMMUNITY): Payer: Self-pay

## 2014-04-18 ENCOUNTER — Ambulatory Visit (HOSPITAL_COMMUNITY): Payer: Medicare Other | Admitting: Anesthesiology

## 2014-04-18 DIAGNOSIS — F17210 Nicotine dependence, cigarettes, uncomplicated: Secondary | ICD-10-CM | POA: Insufficient documentation

## 2014-04-18 DIAGNOSIS — Z79899 Other long term (current) drug therapy: Secondary | ICD-10-CM | POA: Diagnosis not present

## 2014-04-18 DIAGNOSIS — H25812 Combined forms of age-related cataract, left eye: Secondary | ICD-10-CM | POA: Diagnosis present

## 2014-04-18 DIAGNOSIS — K219 Gastro-esophageal reflux disease without esophagitis: Secondary | ICD-10-CM | POA: Diagnosis not present

## 2014-04-18 DIAGNOSIS — F419 Anxiety disorder, unspecified: Secondary | ICD-10-CM | POA: Diagnosis not present

## 2014-04-18 DIAGNOSIS — I10 Essential (primary) hypertension: Secondary | ICD-10-CM | POA: Diagnosis not present

## 2014-04-18 DIAGNOSIS — Z7982 Long term (current) use of aspirin: Secondary | ICD-10-CM | POA: Insufficient documentation

## 2014-04-18 DIAGNOSIS — E118 Type 2 diabetes mellitus with unspecified complications: Secondary | ICD-10-CM | POA: Insufficient documentation

## 2014-04-18 HISTORY — PX: CATARACT EXTRACTION W/PHACO: SHX586

## 2014-04-18 LAB — GLUCOSE, CAPILLARY: Glucose-Capillary: 98 mg/dL (ref 70–99)

## 2014-04-18 SURGERY — PHACOEMULSIFICATION, CATARACT, WITH IOL INSERTION
Anesthesia: Monitor Anesthesia Care | Site: Eye | Laterality: Left

## 2014-04-18 MED ORDER — POVIDONE-IODINE 5 % OP SOLN
OPHTHALMIC | Status: DC | PRN
  Administered 2014-04-18: 1 via OPHTHALMIC

## 2014-04-18 MED ORDER — FENTANYL CITRATE 0.05 MG/ML IJ SOLN
25.0000 ug | INTRAMUSCULAR | Status: AC
  Administered 2014-04-18: 25 ug via INTRAVENOUS
  Filled 2014-04-18: qty 2

## 2014-04-18 MED ORDER — LIDOCAINE HCL (PF) 1 % IJ SOLN
INTRAMUSCULAR | Status: DC | PRN
  Administered 2014-04-18: .4 mL

## 2014-04-18 MED ORDER — PROVISC 10 MG/ML IO SOLN
INTRAOCULAR | Status: DC | PRN
  Administered 2014-04-18: 0.85 mL via INTRAOCULAR

## 2014-04-18 MED ORDER — EPINEPHRINE HCL 1 MG/ML IJ SOLN
INTRAMUSCULAR | Status: AC
  Filled 2014-04-18: qty 1

## 2014-04-18 MED ORDER — TETRACAINE HCL 0.5 % OP SOLN
1.0000 [drp] | OPHTHALMIC | Status: AC
  Administered 2014-04-18 (×3): 1 [drp] via OPHTHALMIC

## 2014-04-18 MED ORDER — LIDOCAINE 3.5 % OP GEL OPTIME - NO CHARGE
OPHTHALMIC | Status: DC | PRN
  Administered 2014-04-18: 2 [drp] via OPHTHALMIC

## 2014-04-18 MED ORDER — CYCLOPENTOLATE-PHENYLEPHRINE 0.2-1 % OP SOLN
1.0000 [drp] | OPHTHALMIC | Status: AC
  Administered 2014-04-18 (×3): 1 [drp] via OPHTHALMIC

## 2014-04-18 MED ORDER — LIDOCAINE HCL 3.5 % OP GEL: 1.0000 "application " | Freq: Once | OPHTHALMIC | Status: DC

## 2014-04-18 MED ORDER — LACTATED RINGERS IV SOLN
INTRAVENOUS | Status: DC
  Administered 2014-04-18: 10:00:00 via INTRAVENOUS

## 2014-04-18 MED ORDER — NEOMYCIN-POLYMYXIN-DEXAMETH 3.5-10000-0.1 OP SUSP
OPHTHALMIC | Status: DC | PRN
  Administered 2014-04-18: 2 [drp] via OPHTHALMIC

## 2014-04-18 MED ORDER — BSS IO SOLN
INTRAOCULAR | Status: DC | PRN
  Administered 2014-04-18: 15 mL via INTRAOCULAR

## 2014-04-18 MED ORDER — PHENYLEPHRINE HCL 2.5 % OP SOLN
1.0000 [drp] | OPHTHALMIC | Status: AC
  Administered 2014-04-18 (×3): 1 [drp] via OPHTHALMIC

## 2014-04-18 MED ORDER — EPINEPHRINE HCL 1 MG/ML IJ SOLN
INTRAMUSCULAR | Status: DC | PRN
Start: 2014-04-18 — End: 2014-04-18
  Administered 2014-04-18: 10:00:00

## 2014-04-18 MED ORDER — MIDAZOLAM HCL 2 MG/2ML IJ SOLN
1.0000 mg | INTRAMUSCULAR | Status: DC | PRN
  Administered 2014-04-18: 2 mg via INTRAVENOUS
  Filled 2014-04-18: qty 2

## 2014-04-18 SURGICAL SUPPLY — 33 items
CAPSULAR TENSION RING-AMO (OPHTHALMIC RELATED) IMPLANT
CLOTH BEACON ORANGE TIMEOUT ST (SAFETY) ×1 IMPLANT
EYE SHIELD UNIVERSAL CLEAR (GAUZE/BANDAGES/DRESSINGS) ×1 IMPLANT
GLOVE BIO SURGEON STRL SZ 6.5 (GLOVE) IMPLANT
GLOVE BIOGEL PI IND STRL 6.5 (GLOVE) IMPLANT
GLOVE BIOGEL PI IND STRL 7.0 (GLOVE) IMPLANT
GLOVE BIOGEL PI IND STRL 7.5 (GLOVE) IMPLANT
GLOVE BIOGEL PI INDICATOR 6.5 (GLOVE) ×1
GLOVE BIOGEL PI INDICATOR 7.0 (GLOVE)
GLOVE BIOGEL PI INDICATOR 7.5 (GLOVE)
GLOVE ECLIPSE 6.5 STRL STRAW (GLOVE) IMPLANT
GLOVE ECLIPSE 7.0 STRL STRAW (GLOVE) ×1 IMPLANT
GLOVE ECLIPSE 7.5 STRL STRAW (GLOVE) IMPLANT
GLOVE EXAM NITRILE LRG STRL (GLOVE) IMPLANT
GLOVE EXAM NITRILE MD LF STRL (GLOVE) IMPLANT
GLOVE SKINSENSE NS SZ6.5 (GLOVE)
GLOVE SKINSENSE NS SZ7.0 (GLOVE)
GLOVE SKINSENSE STRL SZ6.5 (GLOVE) IMPLANT
GLOVE SKINSENSE STRL SZ7.0 (GLOVE) IMPLANT
KIT VITRECTOMY (OPHTHALMIC RELATED) IMPLANT
PAD ARMBOARD 7.5X6 YLW CONV (MISCELLANEOUS) ×1 IMPLANT
PROC W NO LENS (INTRAOCULAR LENS)
PROC W SPEC LENS (INTRAOCULAR LENS)
PROCESS W NO LENS (INTRAOCULAR LENS) IMPLANT
PROCESS W SPEC LENS (INTRAOCULAR LENS) IMPLANT
RETRACTOR IRIS SIGHTPATH (OPHTHALMIC RELATED) IMPLANT
RING MALYGIN (MISCELLANEOUS) IMPLANT
SIGHTPATH CAT PROC W REG LENS (Ophthalmic Related) ×2 IMPLANT
SYRINGE LUER LOK 1CC (MISCELLANEOUS) ×1 IMPLANT
TAPE SURG TRANSPORE 1 IN (GAUZE/BANDAGES/DRESSINGS) IMPLANT
TAPE SURGICAL TRANSPORE 1 IN (GAUZE/BANDAGES/DRESSINGS) ×1
VISCOELASTIC ADDITIONAL (OPHTHALMIC RELATED) IMPLANT
WATER STERILE IRR 250ML POUR (IV SOLUTION) ×1 IMPLANT

## 2014-04-18 NOTE — Op Note (Signed)
Date of Admission: 04/18/2014  Date of Surgery: 04/18/2014   Pre-Op Dx: Cataract Left Eye  Post-Op Dx: Senile Nuclear  Cataract Left  Eye,  Dx Code H25.11   Surgeon: Tonny Branch, M.D.  Assistants: None  Anesthesia: Topical with MAC  Indications: Painless, progressive loss of vision with compromise of daily activities.  Surgery: Cataract Extraction with Intraocular lens Implant Left Eye  Discription: The patient had dilating drops and viscous lidocaine placed into the Left eye in the pre-op holding area. After transfer to the operating room, a time out was performed. The patient was then prepped and draped. Beginning with a 9 degree blade a paracentesis port was made at the surgeon's 2 o'clock position. The anterior chamber was then filled with 1% non-preserved lidocaine. This was followed by filling the anterior chamber with Provisc.  A 2.39mm keratome blade was used to make a clear corneal incision at the temporal limbus.  A bent cystatome needle was used to create a continuous tear capsulotomy. Hydrodissection was performed with balanced salt solution on a Fine canula. The lens nucleus was then removed using the phacoemulsification handpiece. Residual cortex was removed with the I&A handpiece. The anterior chamber and capsular bag were refilled with Provisc. A posterior chamber intraocular lens was placed into the capsular bag with it's injector. The implant was positioned with the Kuglan hook. The Provisc was then removed from the anterior chamber and capsular bag with the I&A handpiece. Stromal hydration of the main incision and paracentesis port was performed with BSS on a Fine canula. The wounds were tested for leak which was negative. The patient tolerated the procedure well. There were no operative complications. The patient was then transferred to the recovery room in stable condition.  Complications: None  Specimen: None  EBL: None  Prosthetic device: Hoya iSert 250, power 19.5 D, SN  [id number].

## 2014-04-18 NOTE — Transfer of Care (Signed)
Immediate Anesthesia Transfer of Care Note  Patient: Dustin Dominguez  Procedure(s) Performed: Procedure(s) with comments: CATARACT EXTRACTION PHACO AND INTRAOCULAR LENS PLACEMENT (IOC) (Left) - CDE:  10.64  Patient Location: Short Stay  Anesthesia Type:MAC  Level of Consciousness: awake, alert  and oriented  Airway & Oxygen Therapy: Patient Spontanous Breathing  Post-op Assessment: Report given to PACU RN  Post vital signs: Reviewed  Complications: No apparent anesthesia complications

## 2014-04-18 NOTE — Discharge Instructions (Signed)

## 2014-04-18 NOTE — H&P (Signed)
I have reviewed the H&P, the patient was re-examined, and I have identified no interval changes in medical condition and plan of care since the history and physical of record  

## 2014-04-18 NOTE — Anesthesia Preprocedure Evaluation (Signed)
Anesthesia Evaluation  Patient identified by MRN, date of birth, ID band Patient awake    Reviewed: Allergy & Precautions, H&P , NPO status , Patient's Chart, lab work & pertinent test results, reviewed documented beta blocker date and time   Airway Mallampati: III TM Distance: >3 FB Neck ROM: Full    Dental  (+) Teeth Intact   Pulmonary sleep apnea , Current Smoker,  breath sounds clear to auscultation        Cardiovascular hypertension, Pt. on medications and Pt. on home beta blockers + CAD, + Past MI, + Cardiac Stents and + Peripheral Vascular Disease Rhythm:Regular Rate:Normal     Neuro/Psych PSYCHIATRIC DISORDERS Anxiety    GI/Hepatic GERD-  Controlled and Medicated,  Endo/Other  diabetes, Type 2, Oral Hypoglycemic Agents  Renal/GU      Musculoskeletal  (+) Arthritis -,   Abdominal   Peds  Hematology   Anesthesia Other Findings   Reproductive/Obstetrics                           Anesthesia Physical Anesthesia Plan  ASA: III  Anesthesia Plan: MAC   Post-op Pain Management:    Induction: Intravenous  Airway Management Planned: Nasal Cannula  Additional Equipment:   Intra-op Plan:   Post-operative Plan:   Informed Consent: I have reviewed the patients History and Physical, chart, labs and discussed the procedure including the risks, benefits and alternatives for the proposed anesthesia with the patient or authorized representative who has indicated his/her understanding and acceptance.     Plan Discussed with:   Anesthesia Plan Comments:         Anesthesia Quick Evaluation

## 2014-04-18 NOTE — Anesthesia Postprocedure Evaluation (Signed)
  Anesthesia Post-op Note  Patient: Dustin Dominguez  Procedure(s) Performed: Procedure(s) with comments: CATARACT EXTRACTION PHACO AND INTRAOCULAR LENS PLACEMENT (IOC) (Left) - CDE:  10.64  Patient Location: Short Stay  Anesthesia Type:MAC  Level of Consciousness: awake, alert  and oriented  Airway and Oxygen Therapy: Patient Spontanous Breathing  Post-op Pain: none  Post-op Assessment: Post-op Vital signs reviewed, Patient's Cardiovascular Status Stable, Respiratory Function Stable, Patent Airway and No signs of Nausea or vomiting  Post-op Vital Signs: Reviewed and stable  Last Vitals:  Filed Vitals:   04/18/14 1010  BP: 106/54  Pulse:   Temp:   Resp: 15    Complications: No apparent anesthesia complications

## 2014-04-19 ENCOUNTER — Encounter (HOSPITAL_COMMUNITY): Payer: Self-pay | Admitting: Ophthalmology

## 2014-04-20 ENCOUNTER — Encounter: Payer: Self-pay | Admitting: *Deleted

## 2014-04-25 ENCOUNTER — Other Ambulatory Visit: Payer: Self-pay | Admitting: *Deleted

## 2014-04-25 MED ORDER — DIAZEPAM 10 MG PO TABS: 10.0000 mg | ORAL_TABLET | Freq: Two times a day (BID) | ORAL | Status: DC

## 2014-04-25 NOTE — Telephone Encounter (Signed)
His medication is okay for 6 month

## 2014-04-25 NOTE — Telephone Encounter (Signed)
Patient last seen in office on 04-08-14. Rx last filled on 03-18-14 for #30. Please advise. If approved please route to Pool A so nurse can phone in to pharmacy

## 2014-04-26 NOTE — Telephone Encounter (Signed)
Called into pharmacy for #30 with 5 refills per Dr Tawanna Sat notation on request

## 2014-05-07 ENCOUNTER — Encounter (HOSPITAL_COMMUNITY): Payer: Self-pay | Admitting: Pharmacy Technician

## 2014-05-09 ENCOUNTER — Encounter (HOSPITAL_COMMUNITY): Payer: Self-pay

## 2014-05-09 ENCOUNTER — Encounter (HOSPITAL_COMMUNITY)
Admission: RE | Admit: 2014-05-09 | Discharge: 2014-05-09 | Disposition: A | Discharge status: Home / Self Care | Payer: Medicare Other | Source: Ambulatory Visit | Attending: Ophthalmology | Admitting: Ophthalmology

## 2014-05-09 MED ORDER — FENTANYL CITRATE 0.05 MG/ML IJ SOLN: 25.0000 ug | INTRAMUSCULAR | Status: DC | PRN

## 2014-05-09 MED ORDER — ONDANSETRON HCL 4 MG/2ML IJ SOLN: 4.0000 mg | Freq: Once | INTRAMUSCULAR | Status: AC | PRN

## 2014-05-10 MED ORDER — TETRACAINE HCL 0.5 % OP SOLN
OPHTHALMIC | Status: AC
  Filled 2014-05-10: qty 2

## 2014-05-10 MED ORDER — CYCLOPENTOLATE-PHENYLEPHRINE OP SOLN OPTIME - NO CHARGE
OPHTHALMIC | Status: AC
  Filled 2014-05-10: qty 2

## 2014-05-10 MED ORDER — PHENYLEPHRINE HCL 2.5 % OP SOLN
OPHTHALMIC | Status: AC
  Filled 2014-05-10: qty 15

## 2014-05-10 MED ORDER — LIDOCAINE HCL 3.5 % OP GEL
OPHTHALMIC | Status: AC
  Filled 2014-05-10: qty 1

## 2014-05-10 MED ORDER — NEOMYCIN-POLYMYXIN-DEXAMETH 3.5-10000-0.1 OP SUSP
OPHTHALMIC | Status: AC
  Filled 2014-05-10: qty 5

## 2014-05-10 MED ORDER — LIDOCAINE HCL (PF) 1 % IJ SOLN
INTRAMUSCULAR | Status: AC
  Filled 2014-05-10: qty 2

## 2014-05-13 ENCOUNTER — Encounter (HOSPITAL_COMMUNITY): Payer: Self-pay | Admitting: *Deleted

## 2014-05-13 ENCOUNTER — Ambulatory Visit (HOSPITAL_COMMUNITY)
Admission: RE | Admit: 2014-05-13 | Discharge: 2014-05-13 | Disposition: A | Discharge status: Home / Self Care | Payer: Medicare Other | Source: Ambulatory Visit | Attending: Ophthalmology | Admitting: Ophthalmology

## 2014-05-13 ENCOUNTER — Ambulatory Visit (HOSPITAL_COMMUNITY): Payer: Medicare Other | Admitting: Anesthesiology

## 2014-05-13 ENCOUNTER — Encounter (HOSPITAL_COMMUNITY)
Admission: RE | Disposition: A | Discharge status: Home / Self Care | Payer: Self-pay | Source: Ambulatory Visit | Attending: Ophthalmology

## 2014-05-13 ENCOUNTER — Encounter (HOSPITAL_COMMUNITY): Payer: Medicare Other | Admitting: Anesthesiology

## 2014-05-13 DIAGNOSIS — Z7982 Long term (current) use of aspirin: Secondary | ICD-10-CM | POA: Insufficient documentation

## 2014-05-13 DIAGNOSIS — F17200 Nicotine dependence, unspecified, uncomplicated: Secondary | ICD-10-CM | POA: Diagnosis not present

## 2014-05-13 DIAGNOSIS — Z79891 Long term (current) use of opiate analgesic: Secondary | ICD-10-CM | POA: Insufficient documentation

## 2014-05-13 DIAGNOSIS — H2511 Age-related nuclear cataract, right eye: Secondary | ICD-10-CM | POA: Insufficient documentation

## 2014-05-13 DIAGNOSIS — Z79899 Other long term (current) drug therapy: Secondary | ICD-10-CM | POA: Insufficient documentation

## 2014-05-13 DIAGNOSIS — F419 Anxiety disorder, unspecified: Secondary | ICD-10-CM | POA: Diagnosis not present

## 2014-05-13 DIAGNOSIS — G4730 Sleep apnea, unspecified: Secondary | ICD-10-CM | POA: Insufficient documentation

## 2014-05-13 DIAGNOSIS — E119 Type 2 diabetes mellitus without complications: Secondary | ICD-10-CM | POA: Insufficient documentation

## 2014-05-13 DIAGNOSIS — I10 Essential (primary) hypertension: Secondary | ICD-10-CM | POA: Diagnosis not present

## 2014-05-13 HISTORY — PX: CATARACT EXTRACTION W/PHACO: SHX586

## 2014-05-13 LAB — GLUCOSE, CAPILLARY: Glucose-Capillary: 114 mg/dL — ABNORMAL HIGH (ref 70–99)

## 2014-05-13 SURGERY — PHACOEMULSIFICATION, CATARACT, WITH IOL INSERTION
Anesthesia: Monitor Anesthesia Care | Site: Eye | Laterality: Right

## 2014-05-13 MED ORDER — MIDAZOLAM HCL 2 MG/2ML IJ SOLN
INTRAMUSCULAR | Status: AC
  Filled 2014-05-13: qty 2

## 2014-05-13 MED ORDER — EPINEPHRINE HCL 1 MG/ML IJ SOLN
INTRAOCULAR | Status: DC | PRN
  Administered 2014-05-13: 11:00:00

## 2014-05-13 MED ORDER — CYCLOPENTOLATE-PHENYLEPHRINE 0.2-1 % OP SOLN
1.0000 [drp] | OPHTHALMIC | Status: AC
  Administered 2014-05-13 (×3): 1 [drp] via OPHTHALMIC

## 2014-05-13 MED ORDER — LIDOCAINE HCL 3.5 % OP GEL
1.0000 "application " | Freq: Once | OPHTHALMIC | Status: AC
  Administered 2014-05-13: 1 via OPHTHALMIC

## 2014-05-13 MED ORDER — FENTANYL CITRATE 0.05 MG/ML IJ SOLN
INTRAMUSCULAR | Status: AC
  Filled 2014-05-13: qty 2

## 2014-05-13 MED ORDER — NEOMYCIN-POLYMYXIN-DEXAMETH 3.5-10000-0.1 OP SUSP
OPHTHALMIC | Status: DC | PRN
  Administered 2014-05-13: 2 [drp] via OPHTHALMIC

## 2014-05-13 MED ORDER — MIDAZOLAM HCL 2 MG/2ML IJ SOLN
1.0000 mg | INTRAMUSCULAR | Status: DC | PRN
  Administered 2014-05-13: 2 mg via INTRAVENOUS

## 2014-05-13 MED ORDER — PROVISC 10 MG/ML IO SOLN
INTRAOCULAR | Status: DC | PRN
  Administered 2014-05-13: 0.85 mL via INTRAOCULAR

## 2014-05-13 MED ORDER — LIDOCAINE HCL (PF) 1 % IJ SOLN
INTRAMUSCULAR | Status: DC | PRN
  Administered 2014-05-13: .5 mL

## 2014-05-13 MED ORDER — PHENYLEPHRINE HCL 2.5 % OP SOLN
1.0000 [drp] | OPHTHALMIC | Status: AC | PRN
  Administered 2014-05-13 (×3): 1 [drp] via OPHTHALMIC

## 2014-05-13 MED ORDER — EPINEPHRINE HCL 1 MG/ML IJ SOLN
INTRAMUSCULAR | Status: AC
  Filled 2014-05-13: qty 1

## 2014-05-13 MED ORDER — TETRACAINE HCL 0.5 % OP SOLN
1.0000 [drp] | OPHTHALMIC | Status: AC
  Administered 2014-05-13 (×3): 1 [drp] via OPHTHALMIC

## 2014-05-13 MED ORDER — FENTANYL CITRATE 0.05 MG/ML IJ SOLN
25.0000 ug | INTRAMUSCULAR | Status: AC
  Administered 2014-05-13 (×2): 25 ug via INTRAVENOUS

## 2014-05-13 MED ORDER — BSS IO SOLN
INTRAOCULAR | Status: DC | PRN
  Administered 2014-05-13: 15 mL

## 2014-05-13 MED ORDER — LACTATED RINGERS IV SOLN
INTRAVENOUS | Status: DC
  Administered 2014-05-13: 10:00:00 via INTRAVENOUS

## 2014-05-13 MED ORDER — POVIDONE-IODINE 5 % OP SOLN
OPHTHALMIC | Status: DC | PRN
  Administered 2014-05-13: 1 via OPHTHALMIC

## 2014-05-13 SURGICAL SUPPLY — 10 items
CLOTH BEACON ORANGE TIMEOUT ST (SAFETY) ×1 IMPLANT
EYE SHIELD UNIVERSAL CLEAR (GAUZE/BANDAGES/DRESSINGS) ×1 IMPLANT
GLOVE BIOGEL PI IND STRL 7.0 (GLOVE) IMPLANT
GLOVE BIOGEL PI INDICATOR 7.0 (GLOVE) ×2
PAD ARMBOARD 7.5X6 YLW CONV (MISCELLANEOUS) ×1 IMPLANT
SIGHTPATH CAT PROC W REG LENS (Ophthalmic Related) ×2 IMPLANT
SYRINGE LUER LOK 1CC (MISCELLANEOUS) ×1 IMPLANT
TAPE SURG TRANSPORE 1 IN (GAUZE/BANDAGES/DRESSINGS) IMPLANT
TAPE SURGICAL TRANSPORE 1 IN (GAUZE/BANDAGES/DRESSINGS) ×1
WATER STERILE IRR 250ML POUR (IV SOLUTION) ×1 IMPLANT

## 2014-05-13 NOTE — Anesthesia Preprocedure Evaluation (Signed)
Anesthesia Evaluation  Patient identified by MRN, date of birth, ID band Patient awake    Reviewed: Allergy & Precautions, H&P , NPO status , Patient's Chart, lab work & pertinent test results, reviewed documented beta blocker date and time   Airway Mallampati: III  TM Distance: >3 FB Neck ROM: Full    Dental  (+) Teeth Intact   Pulmonary sleep apnea , Current Smoker,  breath sounds clear to auscultation        Cardiovascular hypertension, Pt. on medications and Pt. on home beta blockers + CAD, + Past MI, + Cardiac Stents and + Peripheral Vascular Disease Rhythm:Regular Rate:Normal     Neuro/Psych PSYCHIATRIC DISORDERS Anxiety    GI/Hepatic GERD-  Controlled and Medicated,  Endo/Other  diabetes, Type 2, Oral Hypoglycemic Agents  Renal/GU      Musculoskeletal  (+) Arthritis -,   Abdominal   Peds  Hematology   Anesthesia Other Findings   Reproductive/Obstetrics                             Anesthesia Physical Anesthesia Plan  ASA: III  Anesthesia Plan: MAC   Post-op Pain Management:    Induction: Intravenous  Airway Management Planned: Nasal Cannula  Additional Equipment:   Intra-op Plan:   Post-operative Plan:   Informed Consent: I have reviewed the patients History and Physical, chart, labs and discussed the procedure including the risks, benefits and alternatives for the proposed anesthesia with the patient or authorized representative who has indicated his/her understanding and acceptance.     Plan Discussed with:   Anesthesia Plan Comments:         Anesthesia Quick Evaluation

## 2014-05-13 NOTE — Op Note (Signed)
Date of Admission: 05/13/2014  Date of Surgery: 05/13/2014   Pre-Op Dx: Cataract Right Eye  Post-Op Dx: Senile Nuclear Cataract Right  Eye,  Dx Code H25.11  Surgeon: Tonny Branch, M.D.  Assistants: None  Anesthesia: Topical with MAC  Indications: Painless, progressive loss of vision with compromise of daily activities.  Surgery: Cataract Extraction with Intraocular lens Implant Right Eye  Discription: The patient had dilating drops and viscous lidocaine placed into the Right eye in the pre-op holding area. After transfer to the operating room, a time out was performed. The patient was then prepped and draped. Beginning with a 24 degree blade a paracentesis port was made at the surgeon's 2 o'clock position. The anterior chamber was then filled with 1% non-preserved lidocaine. This was followed by filling the anterior chamber with Provisc.  A 2.56mm keratome blade was used to make a clear corneal incision at the temporal limbus.  A bent cystatome needle was used to create a continuous tear capsulotomy. Hydrodissection was performed with balanced salt solution on a Fine canula. The lens nucleus was then removed using the phacoemulsification handpiece. Residual cortex was removed with the I&A handpiece. The anterior chamber and capsular bag were refilled with Provisc. A posterior chamber intraocular lens was placed into the capsular bag with it's injector. The implant was positioned with the Kuglan hook. The Provisc was then removed from the anterior chamber and capsular bag with the I&A handpiece. Stromal hydration of the main incision and paracentesis port was performed with BSS on a Fine canula. The wounds were tested for leak which was negative. The patient tolerated the procedure well. There were no operative complications. The patient was then transferred to the recovery room in stable condition.  Complications: None  Specimen: None  EBL: None  Prosthetic device: Hoya iSert 250, power 20.0  D, SN [id number].

## 2014-05-13 NOTE — Transfer of Care (Signed)
Immediate Anesthesia Transfer of Care Note  Patient: Dustin Dominguez  Procedure(s) Performed: Procedure(s): CATARACT EXTRACTION PHACO AND INTRAOCULAR LENS PLACEMENT RIGHT EYE CDE=12.34 (Right)  Patient Location: Short Stay  Anesthesia Type:MAC  Level of Consciousness: awake, alert , oriented and patient cooperative  Airway & Oxygen Therapy: Patient Spontanous Breathing  Post-op Assessment: Report given to PACU RN, Post -op Vital signs reviewed and stable and Patient moving all extremities  Post vital signs: Reviewed and stable  Complications: No apparent anesthesia complications

## 2014-05-13 NOTE — Discharge Instructions (Signed)

## 2014-05-13 NOTE — Anesthesia Postprocedure Evaluation (Signed)
  Anesthesia Post-op Note  Patient: Dustin Dominguez  Procedure(s) Performed: Procedure(s): CATARACT EXTRACTION PHACO AND INTRAOCULAR LENS PLACEMENT RIGHT EYE CDE=12.34 (Right)  Patient Location: Short Stay  Anesthesia Type:MAC  Level of Consciousness: awake, alert , oriented and patient cooperative  Airway and Oxygen Therapy: Patient Spontanous Breathing  Post-op Pain: none  Post-op Assessment: Post-op Vital signs reviewed, Patient's Cardiovascular Status Stable, Respiratory Function Stable, Patent Airway and Pain level controlled  Post-op Vital Signs: Reviewed and stable  Last Vitals:  Filed Vitals:   05/13/14 1025  BP: 105/54  Pulse:   Temp:   Resp: 17    Complications: No apparent anesthesia complications

## 2014-05-13 NOTE — H&P (Signed)
I have reviewed the H&P, the patient was re-examined, and I have identified no interval changes in medical condition and plan of care since the history and physical of record  

## 2014-05-14 ENCOUNTER — Encounter (HOSPITAL_COMMUNITY): Payer: Self-pay | Admitting: Ophthalmology

## 2014-06-04 ENCOUNTER — Ambulatory Visit (INDEPENDENT_AMBULATORY_CARE_PROVIDER_SITE_OTHER): Payer: Medicare Other

## 2014-06-04 DIAGNOSIS — Z23 Encounter for immunization: Secondary | ICD-10-CM

## 2014-06-24 ENCOUNTER — Telehealth: Payer: Self-pay | Admitting: Family Medicine

## 2014-06-24 NOTE — Telephone Encounter (Signed)
It is okay to change the quantity to 60

## 2014-06-25 ENCOUNTER — Telehealth: Payer: Self-pay | Admitting: Family Medicine

## 2014-06-25 NOTE — Telephone Encounter (Signed)
1 twice daily as needed with 5 refills

## 2014-06-25 NOTE — Telephone Encounter (Signed)
Please make sure that this information got to the pharmacy.

## 2014-06-25 NOTE — Telephone Encounter (Signed)
Patient aware, script was given qty of 60 per [facility] and was called to pharmacy vm.

## 2014-06-25 NOTE — Telephone Encounter (Signed)
Patient to have a qty of 60 on his Valium scripts per [facility].  Pharmacy aware.

## 2014-06-25 NOTE — Telephone Encounter (Signed)
[facility] approved qty of 60 .    Can he continue to get a quantity of 60 for each refill of his Valium?  Pharmacy needs to know.

## 2014-07-15 ENCOUNTER — Other Ambulatory Visit: Payer: Self-pay | Admitting: Family Medicine

## 2014-07-31 ENCOUNTER — Ambulatory Visit: Payer: Medicare Other | Admitting: Family Medicine

## 2014-08-16 ENCOUNTER — Ambulatory Visit (INDEPENDENT_AMBULATORY_CARE_PROVIDER_SITE_OTHER): Payer: Medicare Other | Admitting: Family Medicine

## 2014-08-16 ENCOUNTER — Encounter: Payer: Self-pay | Admitting: Family Medicine

## 2014-08-16 VITALS — BP 125/73 | HR 73 | Temp 98.5°F | Ht 72.0 in | Wt 196.0 lb

## 2014-08-16 DIAGNOSIS — E782 Mixed hyperlipidemia: Secondary | ICD-10-CM

## 2014-08-16 DIAGNOSIS — I70209 Unspecified atherosclerosis of native arteries of extremities, unspecified extremity: Secondary | ICD-10-CM

## 2014-08-16 DIAGNOSIS — N400 Enlarged prostate without lower urinary tract symptoms: Secondary | ICD-10-CM | POA: Diagnosis not present

## 2014-08-16 DIAGNOSIS — E559 Vitamin D deficiency, unspecified: Secondary | ICD-10-CM | POA: Diagnosis not present

## 2014-08-16 DIAGNOSIS — K219 Gastro-esophageal reflux disease without esophagitis: Secondary | ICD-10-CM

## 2014-08-16 DIAGNOSIS — E1159 Type 2 diabetes mellitus with other circulatory complications: Secondary | ICD-10-CM

## 2014-08-16 DIAGNOSIS — R197 Diarrhea, unspecified: Secondary | ICD-10-CM

## 2014-08-16 DIAGNOSIS — I10 Essential (primary) hypertension: Secondary | ICD-10-CM | POA: Diagnosis not present

## 2014-08-16 DIAGNOSIS — E1151 Type 2 diabetes mellitus with diabetic peripheral angiopathy without gangrene: Secondary | ICD-10-CM

## 2014-08-16 DIAGNOSIS — R1031 Right lower quadrant pain: Secondary | ICD-10-CM | POA: Diagnosis not present

## 2014-08-16 DIAGNOSIS — K5900 Constipation, unspecified: Secondary | ICD-10-CM | POA: Diagnosis not present

## 2014-08-16 NOTE — Patient Instructions (Addendum)
Medicare Annual Wellness Visit  [facility] and the medical providers at [facility] strive to bring you the best medical care.  In doing so we not only want to address your current medical conditions and concerns but also to detect new conditions early and prevent illness, disease and health-related problems.    Medicare offers a yearly Wellness Visit which allows our clinical staff to assess your need for preventative services including immunizations, lifestyle education, counseling to decrease risk of preventable diseases and screening for fall risk and other medical concerns.    This visit is provided free of charge (no copay) for all Medicare recipients. The clinical pharmacists at [facility] have begun to conduct these Wellness Visits which will also include a thorough review of all your medications.    As you primary medical provider recommend that you make an appointment for your Annual Wellness Visit if you have not done so already this year.  You may set up this appointment before you leave today or you may call back ([phone]) and schedule an appointment.  Please make sure when you call that you mention that you are scheduling your Annual Wellness Visit with the clinical pharmacist so that the appointment may be made for the proper length of time.     Continue current medications. Continue good therapeutic lifestyle changes which include good diet and exercise. Fall precautions discussed with patient. If an FOBT was given today- please return it to our front desk. If you are over 15 years old - you may need Prevnar 33 or the adult Pneumonia vaccine.  Flu Shots are still available at our office. If you still haven't had one please call to set up a nurse visit to get one.   After your visit with [place] today you will receive a survey in the mail or online from [employer] regarding your care with [place]. Please take a moment to  fill this out. Your feedback is very important to [place] as you can help [place] better understand your patient needs as well as improve your experience and satisfaction. WE CARE ABOUT YOU!!!   The patient should call and make an appointment with the urologist for follow-up of his prostate and PSA We will call and make arrangements for you to see the gastroenterologist that to Dr. Buel Ream place regarding these episodes of diarrhea and constipation and right lower quadrant abdominal pain In the meantime continue to drink plenty of fluids and keep herself well hydrated Avoid caffeine and milk cheese ice cream and dairy products Return to the office for fasting lab work Continue follow-up for chronic pain management with Dr. Nelva Bush

## 2014-08-16 NOTE — Addendum Note (Signed)
Addended by: Zannie Cove on: 08/16/2014 04:27 PM   Modules accepted: Orders

## 2014-08-16 NOTE — Progress Notes (Signed)
Subjective:    Patient ID: Dustin Dominguez, male    DOB: Oct 26, 1940, 74 y.o.   MRN: [id number]  HPI Pt here for follow up and management of chronic medical problems which include hyperlipidemia, hypertension, and diabetes. He is taking medications regularly. The patient has seen a urologist in the past and has chronic pyuria that is not treatable with any antibiotics. He has not seen him for year. He is due to get an FOBT and he is due to get fasting lab work and he will return for this. The patient has had problems with loose bowel movements but this seems to be worse recently. He indicates that at times he may have loose bowel movements for up to 6 days in a row and his many as 10 daily. Then he will have constipation and he knows that a lot of the constipation is secondary to his having to take pain medication. He says that he has had a colonoscopy in the past couple of years. The patient does not check his blood sugars regularly at home. He doesn't check his blood sugars because every time he checks them they are all good. He has not seen his urologist in over a year. He sees Dr. Alverda Skeans regarding his chronic pain management.          Patient Active Problem List   Diagnosis Date Noted  . GERD (gastroesophageal reflux disease) 06/13/2013  . Vitamin D deficiency 06/13/2013  . Degenerative disc disease, cervical 06/13/2013  . Degenerative disc disease, lumbar 06/13/2013  . Multiple thyroid nodules 04/07/2012  . Mixed hyperlipidemia 01/27/2011  . Coronary atherosclerosis of native coronary artery   . Sleep apnea   . Tobacco use disorder   . Essential hypertension, benign   . 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 amLODipine (NORVASC) 10 MG tablet Take 10 mg by mouth daily.  Marland Kitchen aspirin 81 MG EC tablet Take 162 mg by mouth daily.   . diazepam (VALIUM) 10 MG tablet Take 1 tablet  (10 mg total) by mouth 2 (two) times daily.  . fenofibrate 160 MG tablet Take 160 mg by mouth daily.  . fish oil-omega-3 fatty acids 1000 MG capsule Take 2 g by mouth 2 (two) times daily. Takes 2 tablets twice daily  . glimepiride (AMARYL) 2 MG tablet Take 1 mg by mouth daily with breakfast.  . isosorbide mononitrate (IMDUR) 30 MG 24 hr tablet Take 30 mg by mouth daily.  Marland Kitchen lactulose (CHRONULAC) 10 GM/15ML solution Take 15 mLs by mouth daily as needed for mild constipation, moderate constipation or severe constipation.   . metoprolol (LOPRESSOR) 50 MG tablet Take 25 mg by mouth 2 (two) times daily.  Marland Kitchen morphine (MSIR) 30 MG tablet Take 30 mg by mouth 3 (three) times daily.    Marland Kitchen oxyCODONE-acetaminophen (PERCOCET) 10-325 MG per tablet Take 1 tablet by mouth 6 (six) times daily.   . pantoprazole (PROTONIX) 40 MG tablet Take 40 mg by mouth 2 (two) times daily.  . prednisoLONE acetate (PRED FORTE) 1 % ophthalmic suspension Place 1 drop into the left eye 4 (four) times daily.   Marland Kitchen PROLENSA 0.07 % SOLN Place 1 drop into the left eye daily.   . quinapril-hydrochlorothiazide (ACCURETIC) 20-12.5 MG per tablet Take 1 tablet by mouth daily.  . rosuvastatin (CRESTOR) 20 MG tablet Take 20 mg by mouth daily.  . Vitamin D, Ergocalciferol, (DRISDOL)  50000 UNITS CAPS capsule Take 1 capsule by mouth every week.    Review of Systems  Constitutional: Negative.   HENT: Negative.   Eyes: Negative.   Respiratory: Negative.   Cardiovascular: Negative.   Gastrointestinal: Positive for diarrhea (more often for the last few months).  Endocrine: Negative.   Genitourinary: Negative.   Musculoskeletal: Negative.   Skin: Negative.   Allergic/Immunologic: Negative.   Neurological: Negative.   Hematological: Negative.   Psychiatric/Behavioral: Negative.        Objective:   Physical Exam  Constitutional: He is oriented to person, place, and time.  The patient is alert but appears somewhat older than his stated age  of 74 and has a somewhat kyphotic posture with an asymmetric belly due to multiple abdominal surgeries.  HENT:  Head: Normocephalic and atraumatic.  Right Ear: External ear normal.  Left Ear: External ear normal.  Nose: Nose normal.  Mouth/Throat: Oropharynx is clear and moist. No oropharyngeal exudate.  Eyes: Conjunctivae and EOM are normal. Pupils are equal, round, and reactive to light. Right eye exhibits no discharge. Left eye exhibits no discharge. No scleral icterus.  Neck: Normal range of motion. Neck supple. No tracheal deviation present. No thyromegaly present.  There are no carotid bruits or anterior cervical adenopathy  Cardiovascular: Normal rate, regular rhythm, normal heart sounds and intact distal pulses.   No murmur heard. The heart has a regular rate and rhythm at 72/m  Pulmonary/Chest: Effort normal and breath sounds normal. No respiratory distress. He has no wheezes. He has no rales. He exhibits no tenderness.  Abdominal: Soft. Bowel sounds are normal. He exhibits no mass. There is no tenderness. There is no rebound and no guarding.  There is no adenopathy in the inguinal area. The abdomen has an asymmetric appearance with more protrusion on the right side secondary to multiple abdominal surgeries. It is tender in the right lower side with deep palpation. There are no masses or organ enlargement apparent.  Musculoskeletal: Normal range of motion. He exhibits no edema or tenderness.  Lymphadenopathy:    He has no cervical adenopathy.  Neurological: He is alert and oriented to person, place, and time. He has normal reflexes. No cranial nerve deficit.  Skin: Skin is warm and dry. No rash noted.  Psychiatric: He has a normal mood and affect. His behavior is normal. Judgment and thought content normal.  The patient's mood is positive considering all of the problems he is having to deal with.  Nursing note and vitals reviewed.   BP 125/73 mmHg  Pulse 73  Temp(Src) 98.5 F  (36.9 C) (Oral)  Ht 6' (1.829 m)  Wt 196 lb (88.905 kg)  BMI 26.58 kg/m2       Assessment & Plan:  1. 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 Essential hypertension, benign -Blood pressure is good today continue current treatment - POCT CBC; Future - BMP8+EGFR; Future - Hepatic function panel; Future  3. Mixed hyperlipidemia -Continue current treatment and medication will be adjusted depending on lab work results - POCT CBC; Future - NMR, lipoprofile; Future  4. Gastroesophageal reflux disease, esophagitis presence not specified -The patient does not describe any problems with reflux or heartburn today. - POCT CBC; Future - Hepatic function panel; Future  5. Vitamin D deficiency -The  patient should continue with current vitamin D treatment pending results of lab work - POCT CBC; Future - Vit D  25 hydroxy (rtn osteoporosis monitoring); Future  6. BPH (benign prostatic hyperplasia) -The patient is to call and make an appointment for follow-up with his urologist - POCT UA - Microscopic Only - POCT urinalysis dipstick - PSA, total and free; Future  7. Right lower quadrant abdominal pain -GI referral  8. Constipation, unspecified constipation type -GI referral  9. Diarrhea -GI referral  Patient Instructions                       Medicare Annual Wellness Visit  [facility] and the medical providers at [facility] strive to bring you the best medical care.  In doing so we not only want to address your current medical conditions and concerns but also to detect new conditions early and prevent illness, disease and health-related problems.    Medicare offers a yearly Wellness Visit which allows our clinical staff to assess your need for preventative  services including immunizations, lifestyle education, counseling to decrease risk of preventable diseases and screening for fall risk and other medical concerns.    This visit is provided free of charge (no copay) for all Medicare recipients. The clinical pharmacists at [facility] have begun to conduct these Wellness Visits which will also include a thorough review of all your medications.    As you primary medical provider recommend that you make an appointment for your Annual Wellness Visit if you have not done so already this year.  You may set up this appointment before you leave today or you may call back ([phone]) and schedule an appointment.  Please make sure when you call that you mention that you are scheduling your Annual Wellness Visit with the clinical pharmacist so that the appointment may be made for the proper length of time.     Continue current medications. Continue good therapeutic lifestyle changes which include good diet and exercise. Fall precautions discussed with patient. If an FOBT was given today- please return it to our front desk. If you are over 93 years old - you may need Prevnar 55 or the adult Pneumonia vaccine.  Flu Shots are still available at our office. If you still haven't had one please call to set up a nurse visit to get one.   After your visit with [place] today you will receive a survey in the mail or online from [employer] regarding your care with [place]. Please take a moment to fill this out. Your feedback is very important to [place] as you can help [place] better understand your patient needs as well as improve your experience and satisfaction. WE CARE ABOUT YOU!!!   The patient should call and make an appointment with the urologist for follow-up of his prostate and PSA We will call and make arrangements for you to see the gastroenterologist that to Dr. Buel Ream place regarding these episodes of diarrhea and constipation and right lower  quadrant abdominal pain In the meantime continue to drink plenty of fluids and keep herself well hydrated Avoid caffeine and milk cheese ice cream and dairy products Return to the office for fasting lab work Continue follow-up for chronic pain management with Dr. Nelva Bush   Arrie Senate MD

## 2014-09-11 ENCOUNTER — Other Ambulatory Visit: Payer: Self-pay | Admitting: Family Medicine

## 2014-09-11 ENCOUNTER — Other Ambulatory Visit (INDEPENDENT_AMBULATORY_CARE_PROVIDER_SITE_OTHER): Payer: Medicare Other

## 2014-09-11 DIAGNOSIS — Z1212 Encounter for screening for malignant neoplasm of rectum: Secondary | ICD-10-CM

## 2014-09-11 LAB — POCT CBC
Granulocyte percent: 53.6 %G (ref 37–80)
HEMATOCRIT: 44.3 % (ref 43.5–53.7)
Hemoglobin: 14 g/dL — AB (ref 14.1–18.1)
Lymph, poc: 5.9 — AB (ref 0.6–3.4)
MCH, POC: 28.9 pg (ref 27–31.2)
MCHC: 31.5 g/dL — AB (ref 31.8–35.4)
MCV: 91.7 fL (ref 80–97)
MPV: 7.6 fL (ref 0–99.8)
PLATELET COUNT, POC: 400 10*3/uL (ref 142–424)
POC GRANULOCYTE: 7.3 — AB (ref 2–6.9)
POC LYMPH PERCENT: 42.6 %L (ref 10–50)
RBC: 4.84 M/uL (ref 4.69–6.13)
RDW, POC: 13.3 %
WBC: 13.7 10*3/uL — AB (ref 4.6–10.2)

## 2014-09-11 LAB — POCT GLYCOSYLATED HEMOGLOBIN (HGB A1C): Hemoglobin A1C: 5.4

## 2014-09-11 NOTE — Addendum Note (Signed)
Addended by: Selmer Dominion on: 09/11/2014 02:23 PM   Modules accepted: Orders

## 2014-09-11 NOTE — Addendum Note (Signed)
Addended by: Selmer Dominion on: 09/11/2014 02:37 PM   Modules accepted: Orders

## 2014-09-11 NOTE — Progress Notes (Signed)
Lab work for 08/16/14 for Dr Laurance Flatten

## 2014-09-12 LAB — NMR, LIPOPROFILE
Cholesterol: 92 mg/dL — ABNORMAL LOW (ref 100–199)
HDL Cholesterol by NMR: 27 mg/dL — ABNORMAL LOW
HDL Particle Number: 29 umol/L — ABNORMAL LOW
LDL Particle Number: 462 nmol/L
LDL Size: 19.6 nm
LDL-C: 35 mg/dL (ref 0–99)
LP-IR Score: 71 — ABNORMAL HIGH
Small LDL Particle Number: 378 nmol/L
Triglycerides by NMR: 152 mg/dL — ABNORMAL HIGH (ref 0–149)

## 2014-09-12 LAB — BMP8+EGFR
BUN/Creatinine Ratio: 13 (ref 10–22)
BUN: 12 mg/dL (ref 8–27)
CO2: 27 mmol/L (ref 18–29)
Calcium: 9.2 mg/dL (ref 8.6–10.2)
Chloride: 102 mmol/L (ref 97–108)
Creatinine, Ser: 0.93 mg/dL (ref 0.76–1.27)
GFR calc Af Amer: 94 mL/min/1.73
GFR calc non Af Amer: 82 mL/min/1.73
Glucose: 72 mg/dL (ref 65–99)
Potassium: 4.3 mmol/L (ref 3.5–5.2)
Sodium: 142 mmol/L (ref 134–144)

## 2014-09-12 LAB — PSA, TOTAL AND FREE
PSA, Free Pct: 30 %
PSA, Free: 0.03 ng/mL
PSA: 0.1 ng/mL (ref 0.0–4.0)

## 2014-09-12 LAB — HEPATIC FUNCTION PANEL
ALBUMIN: 4 g/dL (ref 3.5–4.8)
ALK PHOS: 42 IU/L (ref 39–117)
ALT: 11 IU/L (ref 0–44)
AST: 16 IU/L (ref 0–40)
BILIRUBIN TOTAL: 0.6 mg/dL (ref 0.0–1.2)
Bilirubin, Direct: 0.23 mg/dL (ref 0.00–0.40)
Total Protein: 6.5 g/dL (ref 6.0–8.5)

## 2014-09-12 LAB — VITAMIN D 25 HYDROXY (VIT D DEFICIENCY, FRACTURES): Vit D, 25-Hydroxy: 51.5 ng/mL (ref 30.0–100.0)

## 2014-09-13 LAB — FECAL OCCULT BLOOD, IMMUNOCHEMICAL: Fecal Occult Bld: POSITIVE — AB

## 2014-09-19 ENCOUNTER — Other Ambulatory Visit (INDEPENDENT_AMBULATORY_CARE_PROVIDER_SITE_OTHER): Payer: Medicare Other

## 2014-09-19 DIAGNOSIS — K921 Melena: Secondary | ICD-10-CM

## 2014-09-19 LAB — POCT CBC
Granulocyte percent: 49.7 %G (ref 37–80)
HCT, POC: 43.2 % — AB (ref 43.5–53.7)
Hemoglobin: 13.5 g/dL — AB (ref 14.1–18.1)
Lymph, poc: 7.1 — AB (ref 0.6–3.4)
MCH, POC: 28.7 pg (ref 27–31.2)
MCHC: 31.2 g/dL — AB (ref 31.8–35.4)
MCV: 92 fL (ref 80–97)
MPV: 7.8 fL (ref 0–99.8)
POC GRANULOCYTE: 7.5 — AB (ref 2–6.9)
POC LYMPH PERCENT: 47.1 %L (ref 10–50)
Platelet Count, POC: 335 10*3/uL (ref 142–424)
RBC: 4.7 M/uL (ref 4.69–6.13)
RDW, POC: 13.3 %
WBC: 15.1 10*3/uL — AB (ref 4.6–10.2)

## 2014-09-19 NOTE — Progress Notes (Signed)
Lab only 

## 2014-09-26 ENCOUNTER — Encounter: Payer: Self-pay | Admitting: *Deleted

## 2014-09-27 ENCOUNTER — Other Ambulatory Visit: Payer: Medicare Other

## 2014-09-27 DIAGNOSIS — Z1212 Encounter for screening for malignant neoplasm of rectum: Secondary | ICD-10-CM

## 2014-09-29 LAB — FECAL OCCULT BLOOD, IMMUNOCHEMICAL: FECAL OCCULT BLD: POSITIVE — AB

## 2014-10-01 ENCOUNTER — Other Ambulatory Visit: Payer: Self-pay | Admitting: *Deleted

## 2014-10-01 DIAGNOSIS — R195 Other fecal abnormalities: Secondary | ICD-10-CM

## 2014-10-02 ENCOUNTER — Other Ambulatory Visit (INDEPENDENT_AMBULATORY_CARE_PROVIDER_SITE_OTHER): Payer: Medicare Other

## 2014-10-02 DIAGNOSIS — R195 Other fecal abnormalities: Secondary | ICD-10-CM

## 2014-10-02 LAB — POCT CBC
Granulocyte percent: 50.8 %G (ref 37–80)
HCT, POC: 43.7 % (ref 43.5–53.7)
HEMOGLOBIN: 13.9 g/dL — AB (ref 14.1–18.1)
Lymph, poc: 6.3 — AB (ref 0.6–3.4)
MCH, POC: 29.2 pg (ref 27–31.2)
MCHC: 31.9 g/dL (ref 31.8–35.4)
MCV: 91.7 fL (ref 80–97)
MPV: 7.6 fL (ref 0–99.8)
POC GRANULOCYTE: 7.4 — AB (ref 2–6.9)
POC LYMPH %: 43.2 % (ref 10–50)
Platelet Count, POC: 282 10*3/uL (ref 142–424)
RBC: 4.77 M/uL (ref 4.69–6.13)
RDW, POC: 13.5 %
WBC: 14.6 10*3/uL — AB (ref 4.6–10.2)

## 2014-10-08 ENCOUNTER — Ambulatory Visit: Payer: Self-pay | Admitting: Internal Medicine

## 2014-10-08 ENCOUNTER — Telehealth: Payer: Self-pay | Admitting: Internal Medicine

## 2014-10-08 NOTE — Telephone Encounter (Signed)
No penalty

## 2014-10-08 NOTE — Telephone Encounter (Signed)
Patient called same day to cancel his appointment.  Patient states he has a severe UTI and just can't get up.  He has rescheduled. Advise on late cancellation fee.

## 2014-10-09 ENCOUNTER — Other Ambulatory Visit: Payer: Self-pay | Admitting: Family Medicine

## 2014-10-18 ENCOUNTER — Other Ambulatory Visit: Payer: Self-pay | Admitting: Family Medicine

## 2014-10-18 NOTE — Telephone Encounter (Signed)
Last seen 08/16/14 DWM  Last Vit D 09/16/14 51.5

## 2014-10-22 ENCOUNTER — Other Ambulatory Visit: Payer: Self-pay | Admitting: Family Medicine

## 2014-11-04 ENCOUNTER — Other Ambulatory Visit: Payer: Self-pay | Admitting: Family Medicine

## 2014-11-19 ENCOUNTER — Other Ambulatory Visit: Payer: Self-pay | Admitting: Family Medicine

## 2014-11-19 ENCOUNTER — Encounter: Payer: Self-pay | Admitting: Gastroenterology

## 2014-11-26 ENCOUNTER — Telehealth: Payer: Self-pay

## 2014-11-26 NOTE — Telephone Encounter (Signed)
Insurance prior authorized Pantoprazole 40 through Nov 21 2015

## 2014-12-02 ENCOUNTER — Ambulatory Visit: Payer: Self-pay | Admitting: Internal Medicine

## 2014-12-09 ENCOUNTER — Other Ambulatory Visit: Payer: Self-pay | Admitting: Family Medicine

## 2014-12-25 ENCOUNTER — Ambulatory Visit: Payer: Medicare Other | Admitting: Family Medicine

## 2014-12-26 ENCOUNTER — Ambulatory Visit (INDEPENDENT_AMBULATORY_CARE_PROVIDER_SITE_OTHER): Payer: Medicare Other | Admitting: Family Medicine

## 2014-12-26 ENCOUNTER — Ambulatory Visit (INDEPENDENT_AMBULATORY_CARE_PROVIDER_SITE_OTHER): Payer: Medicare Other

## 2014-12-26 ENCOUNTER — Encounter (INDEPENDENT_AMBULATORY_CARE_PROVIDER_SITE_OTHER): Payer: Self-pay

## 2014-12-26 ENCOUNTER — Encounter: Payer: Self-pay | Admitting: Family Medicine

## 2014-12-26 VITALS — BP 103/55 | HR 62 | Temp 98.3°F | Ht 72.0 in | Wt 189.0 lb

## 2014-12-26 DIAGNOSIS — E782 Mixed hyperlipidemia: Secondary | ICD-10-CM

## 2014-12-26 DIAGNOSIS — I70209 Unspecified atherosclerosis of native arteries of extremities, unspecified extremity: Secondary | ICD-10-CM

## 2014-12-26 DIAGNOSIS — I10 Essential (primary) hypertension: Secondary | ICD-10-CM

## 2014-12-26 DIAGNOSIS — E1151 Type 2 diabetes mellitus with diabetic peripheral angiopathy without gangrene: Secondary | ICD-10-CM

## 2014-12-26 DIAGNOSIS — R1031 Right lower quadrant pain: Secondary | ICD-10-CM | POA: Diagnosis not present

## 2014-12-26 DIAGNOSIS — E559 Vitamin D deficiency, unspecified: Secondary | ICD-10-CM

## 2014-12-26 DIAGNOSIS — I2510 Atherosclerotic heart disease of native coronary artery without angina pectoris: Secondary | ICD-10-CM

## 2014-12-26 DIAGNOSIS — M5030 Other cervical disc degeneration, unspecified cervical region: Secondary | ICD-10-CM

## 2014-12-26 DIAGNOSIS — M5136 Other intervertebral disc degeneration, lumbar region: Secondary | ICD-10-CM | POA: Diagnosis not present

## 2014-12-26 DIAGNOSIS — M79602 Pain in left arm: Secondary | ICD-10-CM

## 2014-12-26 DIAGNOSIS — E1159 Type 2 diabetes mellitus with other circulatory complications: Secondary | ICD-10-CM

## 2014-12-26 DIAGNOSIS — K219 Gastro-esophageal reflux disease without esophagitis: Secondary | ICD-10-CM

## 2014-12-26 NOTE — Patient Instructions (Addendum)
Medicare Annual Wellness Visit  [facility] and the medical providers at [facility] strive to bring you the best medical care.  In doing so we not only want to address your current medical conditions and concerns but also to detect new conditions early and prevent illness, disease and health-related problems.    Medicare offers a yearly Wellness Visit which allows our clinical staff to assess your need for preventative services including immunizations, lifestyle education, counseling to decrease risk of preventable diseases and screening for fall risk and other medical concerns.    This visit is provided free of charge (no copay) for all Medicare recipients. The clinical pharmacists at [facility] have begun to conduct these Wellness Visits which will also include a thorough review of all your medications.    As you primary medical provider recommend that you make an appointment for your Annual Wellness Visit if you have not done so already this year.  You may set up this appointment before you leave today or you may call back ([phone]) and schedule an appointment.  Please make sure when you call that you mention that you are scheduling your Annual Wellness Visit with the clinical pharmacist so that the appointment may be made for the proper length of time.     Continue current medications. Continue good therapeutic lifestyle changes which include good diet and exercise. Fall precautions discussed with patient. If an FOBT was given today- please return it to our front desk. If you are over 44 years old - you may need Prevnar 58 or the adult Pneumonia vaccine.  Flu Shots are still available at our office. If you still haven't had one please call to set up a nurse visit to get one.   After your visit with [place] today you will receive a survey in the mail or online from [employer] regarding your care with [place]. Please take a moment to  fill this out. Your feedback is very important to [place] as you can help [place] better understand your patient needs as well as improve your experience and satisfaction. WE CARE ABOUT YOU!!!   The patient should keep his appointment with his cardiologist, his gastroenterologist, and with his pain management orthopedist in [place]. If he develops severe pain in the arm or severe shortness of breath or chest pressure he should get to the emergency room immediately He should continue to drink plenty of fluids and take as little pain medicine as possible  Call Dr Domenic Polite for appt !!!!

## 2014-12-26 NOTE — Progress Notes (Signed)
Subjective:    Patient ID: Dustin Dominguez, male    DOB: 01/23/1941, 74 y.o.   MRN: [id number]  HPI Pt here for follow up and management of chronic medical problems which includes hypertension, hyperlipidemia and diabetes. He is taking medications regularly. He continues to have his chronic arthralgias. He is followed regularly by Dr. Nelva Bush for his pain management. He continues to have neck and low back pain with the back pain being much worse. 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 The patient also continues to have some loose bowel movements and abdominal pain predominantly on the right side with the pain. He is had a couple of appointments scheduled with the gastroenterologist and has been unable to keep those. He does have an appointment coming up and he had stability this for further evaluation of this problem. He sees Dr. Domenic Polite as his cardiologist in [place]. He has had some left arm pain and numbness not associated with activity. He says this is similar to the pain he had when he had his heart problems in the past. He denies any more shortness of breath than usual and has no trouble swallowing heartburn or trouble passing his water.     Patient Active Problem List   Diagnosis Date Noted  . GERD (gastroesophageal reflux disease) 06/13/2013  . Vitamin D deficiency 06/13/2013  . Degenerative disc disease, cervical 06/13/2013  . Degenerative disc disease, lumbar 06/13/2013  . Multiple thyroid nodules 04/07/2012  . Mixed hyperlipidemia 01/27/2011  . Coronary atherosclerosis of native coronary artery   . Sleep apnea   . Tobacco use disorder   . Essential hypertension, benign   . 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 amLODipine (NORVASC) 10 MG tablet TAKE 1 TABLET BY MOUTH EVERY DAY  . aspirin 81 MG EC tablet Take 162 mg by mouth daily.   . CRESTOR 20 MG tablet TAKE 1 TABLET BY  MOUTH DAILY.  . diazepam (VALIUM) 10 MG tablet Take 1 tablet (10 mg total) by mouth 2 (two) times daily.  . fenofibrate 160 MG tablet TAKE 1 TABLET BY MOUTH DAILY  . fish oil-omega-3 fatty acids 1000 MG capsule Take 2 g by mouth 2 (two) times daily. Takes 2 tablets twice daily  . glimepiride (AMARYL) 2 MG tablet Take 1 mg by mouth daily with breakfast.  . hydrochlorothiazide (MICROZIDE) 12.5 MG capsule TAKE 1 CAPSULE BY MOUTH EVERY DAY  . isosorbide mononitrate (IMDUR) 30 MG 24 hr tablet TAKE 1 TABLET BY MOUTH DAILY  . lactulose (CHRONULAC) 10 GM/15ML solution Take 15 mLs by mouth daily as needed for mild constipation, moderate constipation or severe constipation.   . metoprolol (LOPRESSOR) 50 MG tablet TAKE 1/2 OF A TABLET BY MOUTH TWICE DAILY  . morphine (MSIR) 30 MG tablet Take 30 mg by mouth 3 (three) times daily.    Marland Kitchen oxyCODONE-acetaminophen (PERCOCET) 10-325 MG per tablet Take 1 tablet by mouth 6 (six) times daily.   . pantoprazole (PROTONIX) 40 MG tablet TAKE 1 TABLET BY MOUTH TWICE DAILY  . prednisoLONE acetate (PRED FORTE) 1 % ophthalmic suspension Place 1 drop into the left eye 4 (four) times daily.   Marland Kitchen PROLENSA 0.07 % SOLN Place 1 drop into the left eye daily.   . quinapril (ACCUPRIL) 20 MG tablet TAKE 1 TABLET BY MOUTH EVERY DAY  . quinapril-hydrochlorothiazide (ACCURETIC) 20-12.5 MG per tablet Take 1 tablet by mouth  daily.  . Vitamin D, Ergocalciferol, (DRISDOL) 50000 UNITS CAPS capsule TAKE 1 CAPSULE BY MOUTH EVERY WEEK.  . [DISCONTINUED] amLODipine (NORVASC) 10 MG tablet Take 10 mg by mouth daily.  . [DISCONTINUED] fenofibrate 160 MG tablet Take 160 mg by mouth daily.  . [DISCONTINUED] isosorbide mononitrate (IMDUR) 30 MG 24 hr tablet Take 30 mg by mouth daily.  . [DISCONTINUED] metoprolol (LOPRESSOR) 50 MG tablet Take 25 mg by mouth 2 (two) times daily.  . [DISCONTINUED] pantoprazole (PROTONIX) 40 MG tablet Take 40 mg by mouth 2 (two) times daily.   No facility-administered  encounter medications on file as of 12/26/2014.      Review of Systems  Constitutional: Negative.   HENT: Negative.   Eyes: Negative.   Respiratory: Negative.   Cardiovascular: Negative.   Gastrointestinal: Negative.   Endocrine: Negative.   Genitourinary: Negative.   Musculoskeletal: Positive for arthralgias (normal - no new problems).  Skin: Negative.   Allergic/Immunologic: Negative.   Neurological: Negative.   Hematological: Negative.   Psychiatric/Behavioral: Negative.        Objective:   Physical Exam  Constitutional: He is oriented to person, place, and time. He appears well-developed and well-nourished. No distress.  Patient is pleasant and alert and older appearing than his stated age of 62 years. He has somewhat contorted body with a protuberant abdomen secondary to previous abdominal surgeries with kyphosis and scoliosis.  HENT:  Head: Normocephalic and atraumatic.  Right Ear: External ear normal.  Left Ear: External ear normal.  Nose: Nose normal.  Mouth/Throat: Oropharynx is clear and moist. No oropharyngeal exudate.  Eyes: Conjunctivae and EOM are normal. Pupils are equal, round, and reactive to light. Right eye exhibits no discharge. Left eye exhibits no discharge. No scleral icterus.  Neck: Normal range of motion. Neck supple. No thyromegaly present.  No carotid bruits or anterior cervical adenopathy or thyromegaly  Cardiovascular: Normal rate, regular rhythm and intact distal pulses.  Exam reveals no friction rub.   No murmur heard. Heart is regular at 72/m  Pulmonary/Chest: Effort normal and breath sounds normal. No respiratory distress. He has no wheezes. He has no rales. He exhibits no tenderness.  Lungs are clear anteriorly and posteriorly and no axillary adenopathy  Abdominal: Soft. Bowel sounds are normal. He exhibits distension. He exhibits no mass. There is tenderness. There is no rebound and no guarding.  The abdomen is protuberant specimen toward the  right side with what appears to be a large incisional type hernia and it is also tender in the right side of abdomen. There are no obvious masses or organ enlargement.  Genitourinary: Rectum normal.  Musculoskeletal: Normal range of motion. He exhibits no edema or tenderness.  The patient has a kyphotic posture with some obvious scoliosis.  Lymphadenopathy:    He has no cervical adenopathy.  Neurological: He is alert and oriented to person, place, and time. He has normal reflexes. No cranial nerve deficit.  Skin: Skin is warm and dry. No rash noted. No erythema. No pallor.  Psychiatric: He has a normal mood and affect. His behavior is normal. Judgment and thought content normal.  Nursing note and vitals reviewed.  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  [facility] reading (PRIMARY) by  Dr.Moore-elevated right hemidiaphragm  Assessment & Plan:  1. Essential hypertension, benign -The blood pressure today is in fact on the low end of the range. He should continue with his current treatment and we will continue to monitor this. - POCT CBC; Future - BMP8+EGFR; Future - Hepatic function panel; Future - DG Chest 2 View; Future  2. Mixed hyperlipidemia -The patient should continue with his current treatment of Crestor and fenofibrate and omega-3 fatty acids along with good blood sugar control. He should continue with as aggressive therapeutic lifestyle changes as his body will permit. - POCT CBC; Future - NMR, lipoprofile; Future - DG Chest 2 View; Future  3. 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 - POCT CBC; Future - POCT  glycosylated hemoglobin (Hb A1C); Future - BMP8+EGFR; Future  4. Gastroesophageal reflux disease, esophagitis presence not specified -He currently has no complaints with acid reflux other than rare heartburn - POCT CBC; Future  5. Vitamin D deficiency -He should continue with any vitamin D replacement that he is currently taking although it is not obvious in the medical record we will address this when the lab work is returned. - POCT CBC; Future - Vit D  25 hydroxy (rtn osteoporosis monitoring); Future  6. ASCVD (arteriosclerotic cardiovascular disease) -EKG today in follow-up with cardiology  7. Left arm pain -EKG today in follow-up with cardiology  8. Right lower quadrant abdominal pain -The patient has an appointment with Dr. Elmo Putt and he should try to keep it this time if possible  9. Degenerative disc disease, cervical -Continue with pain management from the orthopedic surgeon  10. Degenerative disc disease, lumbar -Continue with pain management from the orthopedic surgeon  11. Atherosclerosis of native coronary artery of native heart without angina pectoris -We will arrange a visit with his cardiologist and get an EKG today.  No orders of the defined types were placed in this encounter.   Patient Instructions                       Medicare Annual Wellness Visit  [facility] and the medical providers at [facility] strive to bring you the best medical care.  In doing so we not only want to address your current medical conditions and concerns but also to detect new conditions early and prevent illness, disease and health-related problems.    Medicare offers a yearly Wellness Visit which allows our clinical staff to assess your need for preventative services including immunizations, lifestyle education, counseling to decrease risk of preventable diseases and screening for fall risk and other medical concerns.    This visit is provided free of charge  (no copay) for all Medicare recipients. The clinical pharmacists at [facility] have begun to conduct these Wellness Visits which will also include a thorough review of all your medications.    As you primary medical provider recommend that you make an appointment for your Annual Wellness Visit if you have not done so already this year.  You may set up this appointment before you leave today or you may call back ([phone]) and schedule an appointment.  Please make sure when you call that you mention that you are scheduling your Annual Wellness Visit with the clinical pharmacist so that the appointment may be made for the proper length of time.     Continue current medications.  Continue good therapeutic lifestyle changes which include good diet and exercise. Fall precautions discussed with patient. If an FOBT was given today- please return it to our front desk. If you are over 57 years old - you may need Prevnar 19 or the adult Pneumonia vaccine.  Flu Shots are still available at our office. If you still haven't had one please call to set up a nurse visit to get one.   After your visit with [place] today you will receive a survey in the mail or online from [employer] regarding your care with [place]. Please take a moment to fill this out. Your feedback is very important to [place] as you can help [place] better understand your patient needs as well as improve your experience and satisfaction. WE CARE ABOUT YOU!!!   The patient should keep his appointment with his cardiologist, his gastroenterologist, and with his pain management orthopedist in [place]. If he develops severe pain in the arm or severe shortness of breath or chest pressure he should get to the emergency room immediately He should continue to drink plenty of fluids and take as little pain medicine as possible   Arrie Senate MD

## 2014-12-27 ENCOUNTER — Encounter: Payer: Self-pay | Admitting: Family Medicine

## 2014-12-27 ENCOUNTER — Other Ambulatory Visit: Payer: Self-pay | Admitting: Family Medicine

## 2014-12-27 NOTE — Telephone Encounter (Signed)
Dr Tawanna Sat pt, just seen yesterday, last fill 04/16. Call into [facility] [phone]

## 2014-12-30 ENCOUNTER — Other Ambulatory Visit: Payer: Self-pay | Admitting: Family Medicine

## 2014-12-30 NOTE — Telephone Encounter (Signed)
Refill called to pharmacy's VM 

## 2015-01-03 ENCOUNTER — Other Ambulatory Visit: Payer: Self-pay

## 2015-01-03 ENCOUNTER — Telehealth: Payer: Self-pay | Admitting: Family Medicine

## 2015-01-03 MED ORDER — ROSUVASTATIN CALCIUM 20 MG PO TABS: 20.0000 mg | ORAL_TABLET | Freq: Every day | ORAL | Status: DC

## 2015-01-14 ENCOUNTER — Other Ambulatory Visit: Payer: Self-pay | Admitting: Family Medicine

## 2015-01-24 ENCOUNTER — Other Ambulatory Visit: Payer: Self-pay | Admitting: Family Medicine

## 2015-01-24 NOTE — Telephone Encounter (Signed)
Last seen 12/26/14 DWM  Last VIT D 09/11/14   51.1

## 2015-02-03 ENCOUNTER — Ambulatory Visit: Payer: Self-pay | Admitting: Internal Medicine

## 2015-02-05 ENCOUNTER — Telehealth: Payer: Self-pay | Admitting: Family Medicine

## 2015-02-05 NOTE — Telephone Encounter (Signed)
Pt called  He has been out of Crestor since Sunday.  He states it is too expensive to continue and the coupon will not work for him.  He is aware that it may be Monday before he gets a call back as to what we will change to.

## 2015-02-05 NOTE — Telephone Encounter (Signed)
Lipitor 20 mg #30 one daily refill as needed and recheck liver function tests in 6 weeks

## 2015-02-06 MED ORDER — ATORVASTATIN CALCIUM 20 MG PO TABS: 20.0000 mg | ORAL_TABLET | Freq: Every day | ORAL | Status: DC

## 2015-02-06 NOTE — Telephone Encounter (Signed)
Pt aware and med ordered 

## 2015-02-13 ENCOUNTER — Other Ambulatory Visit: Payer: Self-pay | Admitting: Family Medicine

## 2015-02-14 ENCOUNTER — Other Ambulatory Visit: Payer: Self-pay | Admitting: Family Medicine

## 2015-03-03 ENCOUNTER — Other Ambulatory Visit: Payer: Self-pay | Admitting: *Deleted

## 2015-03-03 NOTE — Telephone Encounter (Signed)
Patient last seen in office on 12-26-14. Rx last filled on 01-29-15 for #60. Please advise. If approved please route to Pool A so nurse can phone in to [facility] at [phone]

## 2015-03-04 ENCOUNTER — Other Ambulatory Visit: Payer: Self-pay | Admitting: Family Medicine

## 2015-03-04 MED ORDER — DIAZEPAM 10 MG PO TABS: 10.0000 mg | ORAL_TABLET | Freq: Two times a day (BID) | ORAL | Status: DC

## 2015-03-04 NOTE — Telephone Encounter (Signed)
rx called to pharmacy 

## 2015-04-02 ENCOUNTER — Other Ambulatory Visit: Payer: Self-pay | Admitting: Family Medicine

## 2015-05-03 ENCOUNTER — Other Ambulatory Visit: Payer: Self-pay | Admitting: Family Medicine

## 2015-05-05 NOTE — Telephone Encounter (Signed)
This is okay to refill but the patient needs to have a vitamin D level checked.

## 2015-05-05 NOTE — Telephone Encounter (Signed)
Last seen 12/26/14 DWM  Last Vit D 09/11/14  51/5

## 2015-05-06 ENCOUNTER — Ambulatory Visit (INDEPENDENT_AMBULATORY_CARE_PROVIDER_SITE_OTHER): Payer: Medicare Other | Admitting: Family Medicine

## 2015-05-06 ENCOUNTER — Encounter: Payer: Self-pay | Admitting: Family Medicine

## 2015-05-06 VITALS — BP 126/67 | HR 66 | Temp 97.0°F | Ht 72.0 in | Wt 195.0 lb

## 2015-05-06 DIAGNOSIS — E559 Vitamin D deficiency, unspecified: Secondary | ICD-10-CM

## 2015-05-06 DIAGNOSIS — E782 Mixed hyperlipidemia: Secondary | ICD-10-CM | POA: Diagnosis not present

## 2015-05-06 DIAGNOSIS — Z23 Encounter for immunization: Secondary | ICD-10-CM | POA: Diagnosis not present

## 2015-05-06 DIAGNOSIS — E1151 Type 2 diabetes mellitus with diabetic peripheral angiopathy without gangrene: Secondary | ICD-10-CM

## 2015-05-06 DIAGNOSIS — K219 Gastro-esophageal reflux disease without esophagitis: Secondary | ICD-10-CM

## 2015-05-06 DIAGNOSIS — I2510 Atherosclerotic heart disease of native coronary artery without angina pectoris: Secondary | ICD-10-CM

## 2015-05-06 DIAGNOSIS — E1159 Type 2 diabetes mellitus with other circulatory complications: Secondary | ICD-10-CM

## 2015-05-06 DIAGNOSIS — I10 Essential (primary) hypertension: Secondary | ICD-10-CM | POA: Diagnosis not present

## 2015-05-06 DIAGNOSIS — I70209 Unspecified atherosclerosis of native arteries of extremities, unspecified extremity: Secondary | ICD-10-CM | POA: Diagnosis not present

## 2015-05-06 LAB — POCT GLYCOSYLATED HEMOGLOBIN (HGB A1C): HEMOGLOBIN A1C: 5.3

## 2015-05-06 NOTE — Progress Notes (Signed)
Subjective:    Patient ID: Dustin Dominguez, male    DOB: Jun 28, 1941, 74 y.o.   MRN: [id number]  HPI Pt here for follow up and management of chronic medical problems which includes hypertension, hyperlipidemia, and diabetes. He is taking medications regularly. This patient has a history of chronic headaches secondary to degenerative cervical disc disease. He also has degenerative lumbar disc disease. He has chronic pain associated with both head neck and back. He sees Dr. Ethelene Hal who controls his pain medications. He will have his lab work done today and he is due to get a urine microalbumin today. He is already had his flu shot and Prevnar vaccine. The patient denies any significant chest pain. He does occasionally have pain but is unassociated with activity. He also has occasional left arm pain which is most likely secondary to the arthritis in his neck. He cannot walk or stand for long periods of time and no greater than 5 minutes without having severe pain in his neck and back. He does have a scooter that he uses regularly and this allows him to get around better instead of being confined to his home. He has occasional nausea but no more than usual. 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 He sees the orthopedic surgeon every 3-4 months and he does not get any more shots in his neck or back R hasn't had any for a good while.        Patient Active Problem List   Diagnosis Date Noted  . GERD (gastroesophageal reflux disease) 06/13/2013  . Vitamin D deficiency 06/13/2013  . Degenerative disc disease, cervical 06/13/2013  . Degenerative disc disease, lumbar 06/13/2013  . Multiple thyroid nodules 04/07/2012  . Mixed hyperlipidemia 01/27/2011  . Coronary atherosclerosis of native coronary artery   . Sleep apnea   . Tobacco use disorder   . Essential hypertension, benign   . Diabetes mellitus type 2  with atherosclerosis of arteries of extremities [facility])    Outpatient Encounter Prescriptions as of 05/06/2015  Medication Sig  . amLODipine (NORVASC) 10 MG tablet TAKE 1 TABLET BY MOUTH EVERY DAY  . aspirin 81 MG EC tablet Take 162 mg by mouth daily.   Marland Kitchen atorvastatin (LIPITOR) 20 MG tablet Take 1 tablet (20 mg total) by mouth daily.  . diazepam (VALIUM) 10 MG tablet Take 1 tablet (10 mg total) by mouth 2 (two) times daily.  . fenofibrate 160 MG tablet TAKE 1 TABLET BY MOUTH DAILY  . fish oil-omega-3 fatty acids 1000 MG capsule Take 2 g by mouth 2 (two) times daily. Takes 2 tablets twice daily  . glimepiride (AMARYL) 2 MG tablet Take 1 mg by mouth daily with breakfast.  . hydrochlorothiazide (MICROZIDE) 12.5 MG capsule TAKE 1 CAPSULE BY MOUTH EVERY DAY  . isosorbide mononitrate (IMDUR) 30 MG 24 hr tablet TAKE 1 TABLET BY MOUTH DAILY  . lactulose (CHRONULAC) 10 GM/15ML solution Take 15 mLs by mouth daily as needed for mild constipation, moderate constipation or severe constipation.   . metoprolol (LOPRESSOR) 50 MG tablet TAKE 1/2 TABLET BY MOUTH TWICE DAILY  . morphine (MSIR) 30 MG tablet Take 30 mg by mouth 3 (three) times daily.    Marland Kitchen oxyCODONE-acetaminophen (PERCOCET) 10-325 MG per tablet Take 1 tablet by mouth 6 (six) times daily.   . pantoprazole (PROTONIX) 40 MG tablet TAKE 1 TABLET BY MOUTH TWICE DAILY  . prednisoLONE acetate (PRED  FORTE) 1 % ophthalmic suspension Place 1 drop into the left eye 4 (four) times daily.   Marland Kitchen PROLENSA 0.07 % SOLN Place 1 drop into the left eye daily.   . quinapril (ACCUPRIL) 20 MG tablet TAKE 1 TABLET BY MOUTH EVERY DAY  . quinapril-hydrochlorothiazide (ACCURETIC) 20-12.5 MG per tablet Take 1 tablet by mouth daily.  . rosuvastatin (CRESTOR) 20 MG tablet Take 1 tablet (20 mg total) by mouth daily.  . Vitamin D, Ergocalciferol, (DRISDOL) 50000 UNITS CAPS capsule TAKE 1 CAPSULE BY MOUTH EVERY WEEK.   No facility-administered encounter medications on file as of  05/06/2015.     Review of Systems  Constitutional: Negative.   HENT: Negative.   Eyes: Negative.   Respiratory: Negative.   Cardiovascular: Negative.   Gastrointestinal: Negative.   Endocrine: Negative.   Genitourinary: Negative.   Musculoskeletal: Negative.   Skin: Negative.   Allergic/Immunologic: Negative.   Neurological: Negative.   Hematological: Negative.   Psychiatric/Behavioral: Negative.        Objective:   Physical Exam  Constitutional: He is oriented to person, place, and time. He appears well-nourished. No distress.  The patient is pleasant and alert and has a somewhat kyphotic posture due to his chronic neck and back pain.  HENT:  Head: Normocephalic and atraumatic.  Right Ear: External ear normal.  Left Ear: External ear normal.  Nose: Nose normal.  Mouth/Throat: Oropharynx is clear and moist. No oropharyngeal exudate.  Eyes: Conjunctivae and EOM are normal. Pupils are equal, round, and reactive to light. Right eye exhibits no discharge. Left eye exhibits no discharge. No scleral icterus.  Neck: Normal range of motion. Neck supple. No thyromegaly present.  Without bruits thyromegaly or anterior cervical adenopathy  Cardiovascular: Normal rate, regular rhythm, normal heart sounds and intact distal pulses.   No murmur heard. The heart has a regular rate and rhythm at 72/m  Pulmonary/Chest: Effort normal and breath sounds normal. No respiratory distress. He has no wheezes. He has no rales. He exhibits no tenderness.  Clear anteriorly and posteriorly  Abdominal: Soft. Bowel sounds are normal. He exhibits no mass. There is tenderness. There is no rebound and no guarding.  The patient has had multiple abdominal surgeries and has a protuberant abdomen to his right side and is generally more tender on the right side. There are no masses or organ enlargement or inguinal adenopathy  Musculoskeletal: He exhibits no edema or tenderness.  His range of motion is somewhat  hesitant  because of the neck and back pain but he is able to ambulate without assistance.  Lymphadenopathy:    He has no cervical adenopathy.  Neurological: He is alert and oriented to person, place, and time. He has normal reflexes. No cranial nerve deficit.  Skin: Skin is warm and dry. No rash noted.  Psychiatric: He has a normal mood and affect. His behavior is normal. Judgment and thought content normal.  Nursing note and vitals reviewed.  BP 126/67 mmHg  Pulse 66  Temp(Src) 97 F (36.1 C) (Oral)  Ht 6' (1.829 m)  Wt 195 lb (88.451 kg)  BMI 26.44 kg/m2 Results for orders placed or performed in visit on 05/06/15  POCT glycosylated hemoglobin (Hb A1C)  Result Value Ref Range   Hemoglobin A1C 5.3    The patient was informed of the above results before he left the office.       Assessment & Plan:  1. Essential hypertension, benign -The blood pressure is under good control today. He will  continue with his current treatment and we'll continue to watch his sodium intake - BMP8+EGFR - CBC with Differential/Platelet - Hepatic function panel  2. Mixed hyperlipidemia -He will continue with his atorvastatin and aggressive therapeutic lifestyle changes. - CBC with Differential/Platelet - Lipid panel  3. 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 Gastroesophageal reflux disease, esophagitis presence not specified -He has no complaints with his stomach today and we'll continue to take his proton inhibitor for pantoprazole. - CBC with Differential/Platelet - Hepatic function panel  5. Vitamin D deficiency -Continue current treatment pending results of lab work - CBC with Differential/Platelet -  Vit D  25 hydroxy (rtn osteoporosis monitoring)  6. ASCVD (arteriosclerotic cardiovascular disease) -He is having no chest pain and he will continue with his atorvastatin and antihypertensive medicines and fenofibrate - CBC with Differential/Platelet - Lipid panel  7. Atherosclerosis of native coronary artery of native heart without angina pectoris -And continue with as aggressive therapeutic lifestyle changes as possible and current statin therapy along with fenofibrate. - CBC with Differential/Platelet - Lipid panel  Patient Instructions                       Medicare Annual Wellness Visit  [facility] and the medical providers at [facility] strive to bring you the best medical care.  In doing so we not only want to address your current medical conditions and concerns but also to detect new conditions early and prevent illness, disease and health-related problems.    Medicare offers a yearly Wellness Visit which allows our clinical staff to assess your need for preventative services including immunizations, lifestyle education, counseling to decrease risk of preventable diseases and screening for fall risk and other medical concerns.    This visit is provided free of charge (no copay) for all Medicare recipients. The clinical pharmacists at [facility] have begun to conduct these Wellness Visits which will also include a thorough review of all your medications.    As you primary medical provider recommend that you make an appointment for your Annual Wellness Visit if you have not done so already this year.  You may set up this appointment before you leave today or you may call back ([phone]) and schedule an appointment.  Please make sure when you call that you mention that you are scheduling your Annual Wellness Visit with the clinical pharmacist so that the appointment may be made for the proper length of time.     Continue current  medications. Continue good therapeutic lifestyle changes which include good diet and exercise. Fall precautions discussed with patient. If an FOBT was given today- please return it to our front desk. If you are over 67 years old - you may need Prevnar 77 or the adult Pneumonia vaccine.  **Flu shots are available--- please call and schedule a FLU-CLINIC appointment**  After your visit with [place] today you will receive a survey in the mail or online from [employer] regarding your care with [place]. Please take a moment to fill this out. Your feedback is very important to [place] as you can help [place] better understand your patient needs as well as improve your experience and satisfaction. WE CARE ABOUT  YOU!!!   The patient should continue his usual routine and stay as active as possible He should follow-up with orthopedic surgeon as planned He should try to drink plenty of fluids and stay well hydrated this winter and use hand cleansers for respiratory protection We will call with his lab work results as soon as that becomes available   Arrie Senate MD

## 2015-05-06 NOTE — Patient Instructions (Addendum)
Medicare Annual Wellness Visit  [facility] and the medical providers at [facility] strive to bring you the best medical care.  In doing so we not only want to address your current medical conditions and concerns but also to detect new conditions early and prevent illness, disease and health-related problems.    Medicare offers a yearly Wellness Visit which allows our clinical staff to assess your need for preventative services including immunizations, lifestyle education, counseling to decrease risk of preventable diseases and screening for fall risk and other medical concerns.    This visit is provided free of charge (no copay) for all Medicare recipients. The clinical pharmacists at [facility] have begun to conduct these Wellness Visits which will also include a thorough review of all your medications.    As you primary medical provider recommend that you make an appointment for your Annual Wellness Visit if you have not done so already this year.  You may set up this appointment before you leave today or you may call back ([phone]) and schedule an appointment.  Please make sure when you call that you mention that you are scheduling your Annual Wellness Visit with the clinical pharmacist so that the appointment may be made for the proper length of time.     Continue current medications. Continue good therapeutic lifestyle changes which include good diet and exercise. Fall precautions discussed with patient. If an FOBT was given today- please return it to our front desk. If you are over 60 years old - you may need Prevnar 52 or the adult Pneumonia vaccine.  **Flu shots are available--- please call and schedule a FLU-CLINIC appointment**  After your visit with [place] today you will receive a survey in the mail or online from [employer] regarding your care with [place]. Please take a moment to fill this out. Your feedback is very  important to [place] as you can help [place] better understand your patient needs as well as improve your experience and satisfaction. WE CARE ABOUT YOU!!!   The patient should continue his usual routine and stay as active as possible He should follow-up with orthopedic surgeon as planned He should try to drink plenty of fluids and stay well hydrated this winter and use hand cleansers for respiratory protection We will call with his lab work results as soon as that becomes available

## 2015-05-07 ENCOUNTER — Other Ambulatory Visit: Payer: Self-pay | Admitting: *Deleted

## 2015-05-07 DIAGNOSIS — D72829 Elevated white blood cell count, unspecified: Secondary | ICD-10-CM

## 2015-05-07 LAB — CBC WITH DIFFERENTIAL/PLATELET
Basophils Absolute: 0.1 10*3/uL (ref 0.0–0.2)
Basos: 1 %
EOS (ABSOLUTE): 0.8 10*3/uL — ABNORMAL HIGH (ref 0.0–0.4)
EOS: 4 %
HEMATOCRIT: 38.5 % (ref 37.5–51.0)
Hemoglobin: 13.2 g/dL (ref 12.6–17.7)
Immature Grans (Abs): 0 10*3/uL (ref 0.0–0.1)
Immature Granulocytes: 0 %
LYMPHS ABS: 7.4 10*3/uL — AB (ref 0.7–3.1)
Lymphs: 41 %
MCH: 30.7 pg (ref 26.6–33.0)
MCHC: 34.3 g/dL (ref 31.5–35.7)
MCV: 90 fL (ref 79–97)
MONOS ABS: 1.3 10*3/uL — AB (ref 0.1–0.9)
Monocytes: 7 %
Neutrophils Absolute: 8.7 10*3/uL — ABNORMAL HIGH (ref 1.4–7.0)
Neutrophils: 47 %
Platelets: 351 10*3/uL (ref 150–379)
RBC: 4.3 x10E6/uL (ref 4.14–5.80)
RDW: 13.8 % (ref 12.3–15.4)
WBC: 18.3 10*3/uL — AB (ref 3.4–10.8)

## 2015-05-07 LAB — BMP8+EGFR
BUN / CREAT RATIO: 22 (ref 10–22)
BUN: 16 mg/dL (ref 8–27)
CO2: 29 mmol/L (ref 18–29)
CREATININE: 0.72 mg/dL — AB (ref 0.76–1.27)
Calcium: 8.4 mg/dL — ABNORMAL LOW (ref 8.6–10.2)
Chloride: 104 mmol/L (ref 97–106)
GFR calc Af Amer: 108 mL/min/{1.73_m2} (ref >59)
GFR, EST NON AFRICAN AMERICAN: 93 mL/min/{1.73_m2} (ref >59)
GLUCOSE: 67 mg/dL (ref 65–99)
Potassium: 3.5 mmol/L (ref 3.5–5.2)
Sodium: 145 mmol/L — ABNORMAL HIGH (ref 136–144)

## 2015-05-07 LAB — HEPATIC FUNCTION PANEL
ALBUMIN: 3.6 g/dL (ref 3.5–4.8)
ALT: 14 IU/L (ref 0–44)
AST: 15 IU/L (ref 0–40)
Alkaline Phosphatase: 43 IU/L (ref 39–117)
Bilirubin Total: 0.4 mg/dL (ref 0.0–1.2)
Bilirubin, Direct: 0.19 mg/dL (ref 0.00–0.40)
Total Protein: 6 g/dL (ref 6.0–8.5)

## 2015-05-07 LAB — LIPID PANEL
Chol/HDL Ratio: 4.1 ratio units (ref 0.0–5.0)
Cholesterol, Total: 115 mg/dL (ref 100–199)
HDL: 28 mg/dL — AB (ref >39)
LDL Calculated: 64 mg/dL (ref 0–99)
TRIGLYCERIDES: 116 mg/dL (ref 0–149)
VLDL Cholesterol Cal: 23 mg/dL (ref 5–40)

## 2015-05-07 LAB — VITAMIN D 25 HYDROXY (VIT D DEFICIENCY, FRACTURES): Vit D, 25-Hydroxy: 38.2 ng/mL (ref 30.0–100.0)

## 2015-05-20 ENCOUNTER — Other Ambulatory Visit: Payer: Self-pay | Admitting: Family Medicine

## 2015-05-23 ENCOUNTER — Ambulatory Visit (HOSPITAL_COMMUNITY): Payer: Self-pay | Admitting: Hematology & Oncology

## 2015-06-03 ENCOUNTER — Other Ambulatory Visit: Payer: Self-pay | Admitting: *Deleted

## 2015-06-03 MED ORDER — DIAZEPAM 10 MG PO TABS: 10.0000 mg | ORAL_TABLET | Freq: Two times a day (BID) | ORAL | Status: DC

## 2015-06-03 NOTE — Telephone Encounter (Signed)
Last filled 04/05/15, last seen 05/06/15. Call in at [facility]

## 2015-06-04 ENCOUNTER — Other Ambulatory Visit: Payer: Self-pay | Admitting: Family Medicine

## 2015-06-10 ENCOUNTER — Encounter (HOSPITAL_COMMUNITY): Payer: Medicare Other | Attending: Hematology & Oncology | Admitting: Hematology & Oncology

## 2015-06-10 ENCOUNTER — Telehealth (HOSPITAL_COMMUNITY): Payer: Self-pay

## 2015-06-10 VITALS — BP 126/56 | HR 59 | Temp 98.0°F | Resp 14 | Ht 70.25 in | Wt 198.2 lb

## 2015-06-10 DIAGNOSIS — E876 Hypokalemia: Secondary | ICD-10-CM | POA: Insufficient documentation

## 2015-06-10 DIAGNOSIS — D72829 Elevated white blood cell count, unspecified: Secondary | ICD-10-CM | POA: Diagnosis present

## 2015-06-10 LAB — CBC WITH DIFFERENTIAL/PLATELET
BASOS ABS: 0.1 10*3/uL (ref 0.0–0.1)
Basophils Relative: 1 %
EOS ABS: 0.8 10*3/uL — AB (ref 0.0–0.7)
Eosinophils Relative: 6 %
HCT: 38 % — ABNORMAL LOW (ref 39.0–52.0)
Hemoglobin: 13.3 g/dL (ref 13.0–17.0)
LYMPHS ABS: 6.2 10*3/uL — AB (ref 0.7–4.0)
LYMPHS PCT: 42 %
MCH: 31.9 pg (ref 26.0–34.0)
MCHC: 35 g/dL (ref 30.0–36.0)
MCV: 91.1 fL (ref 78.0–100.0)
Monocytes Absolute: 1 10*3/uL (ref 0.1–1.0)
Monocytes Relative: 7 %
NEUTROS PCT: 45 %
Neutro Abs: 6.5 10*3/uL (ref 1.7–7.7)
PLATELETS: 330 10*3/uL (ref 150–400)
RBC: 4.17 MIL/uL — AB (ref 4.22–5.81)
RDW: 14.1 % (ref 11.5–15.5)
WBC: 14.6 10*3/uL — AB (ref 4.0–10.5)

## 2015-06-10 LAB — COMPREHENSIVE METABOLIC PANEL
ALT: 21 U/L (ref 17–63)
ANION GAP: 11 (ref 5–15)
AST: 24 U/L (ref 15–41)
Albumin: 3.7 g/dL (ref 3.5–5.0)
Alkaline Phosphatase: 39 U/L (ref 38–126)
BUN: 17 mg/dL (ref 6–20)
CHLORIDE: 103 mmol/L (ref 101–111)
CO2: 26 mmol/L (ref 22–32)
CREATININE: 0.74 mg/dL (ref 0.61–1.24)
Calcium: 8.7 mg/dL — ABNORMAL LOW (ref 8.9–10.3)
Glucose, Bld: 150 mg/dL — ABNORMAL HIGH (ref 65–99)
POTASSIUM: 2.7 mmol/L — AB (ref 3.5–5.1)
SODIUM: 140 mmol/L (ref 135–145)
Total Bilirubin: 0.6 mg/dL (ref 0.3–1.2)
Total Protein: 6.8 g/dL (ref 6.5–8.1)

## 2015-06-10 LAB — LACTATE DEHYDROGENASE: LDH: 156 U/L (ref 98–192)

## 2015-06-10 LAB — SEDIMENTATION RATE: SED RATE: 5 mm/h (ref 0–16)

## 2015-06-10 LAB — C-REACTIVE PROTEIN: CRP: 0.6 mg/dL (ref <1.0)

## 2015-06-10 MED ORDER — POTASSIUM CHLORIDE CRYS ER 20 MEQ PO TBCR: EXTENDED_RELEASE_TABLET | ORAL | Status: DC

## 2015-06-10 NOTE — Patient Instructions (Addendum)
[place] at [facility] Discharge Instructions  RECOMMENDATIONS MADE BY THE CONSULTANT AND ANY TEST RESULTS WILL BE SENT TO YOUR REFERRING PHYSICIAN.   Exam completed by Dr Whitney Muse today Blood work today Return to see the doctor in 2-3 weeks  Please call the clinic if you have any questions or concerns    Thank you for choosing [place] at [facility] to provide your oncology and hematology care.  To afford each patient quality time with our provider, please arrive at least 15 minutes before your scheduled appointment time.    You need to re-schedule your appointment should you arrive 10 or more minutes late.  We strive to give you quality time with our providers, and arriving late affects you and other patients whose appointments are after yours.  Also, if you no show three or more times for appointments you may be dismissed from the clinic at the providers discretion.     Again, thank you for choosing [facility].  Our hope is that these requests will decrease the amount of time that you wait before being seen by our physicians.       _____________________________________________________________  Should you have questions after your visit to [facility], please contact our office at [phone] between the hours of 8:30 a.m. and 4:30 p.m.  Voicemails left after 4:30 p.m. will not be returned until the following business day.  For prescription refill requests, have your pharmacy contact our office.

## 2015-06-10 NOTE — Progress Notes (Signed)
[name] presented for labwork. Labs per MD order drawn via Peripheral Line 23 gauge needle inserted in left AC  Good blood return present. Procedure without incident.  Needle removed intact. Patient tolerated procedure well.

## 2015-06-10 NOTE — Telephone Encounter (Signed)
CRITICAL VALUE ALERT Critical value received:  Potassium 2.7 Date of notification:  06/11/15  Time of notification: [id number]  Critical value read back:  Yes.   Nurse who received alert:  Mickie Kay, RN MD notified (1st page):  Dr. Whitney Muse @ [phone]
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
## 2015-06-10 NOTE — Telephone Encounter (Signed)
Patient notified that he has a very low potassium level and that a prescription is being called into his pharmacy.  Potassium 20 mEq tablets # 40, instructions are to take 40 mEq tid for 3 days, then 62mEq bid until her returns for lab check on Monday.  Verbalized understanding of instructions.

## 2015-06-11 ENCOUNTER — Other Ambulatory Visit: Payer: Self-pay | Admitting: Family Medicine

## 2015-06-11 LAB — PATHOLOGIST SMEAR REVIEW

## 2015-06-16 ENCOUNTER — Encounter (HOSPITAL_BASED_OUTPATIENT_CLINIC_OR_DEPARTMENT_OTHER): Payer: Medicare Other

## 2015-06-16 DIAGNOSIS — E876 Hypokalemia: Secondary | ICD-10-CM

## 2015-06-16 DIAGNOSIS — D72829 Elevated white blood cell count, unspecified: Secondary | ICD-10-CM | POA: Diagnosis not present

## 2015-06-16 LAB — BASIC METABOLIC PANEL
ANION GAP: 5 (ref 5–15)
BUN: 21 mg/dL — ABNORMAL HIGH (ref 6–20)
CHLORIDE: 109 mmol/L (ref 101–111)
CO2: 26 mmol/L (ref 22–32)
Calcium: 8.6 mg/dL — ABNORMAL LOW (ref 8.9–10.3)
Creatinine, Ser: 0.84 mg/dL (ref 0.61–1.24)
GFR calc non Af Amer: >60 mL/min (ref >60)
Glucose, Bld: 153 mg/dL — ABNORMAL HIGH (ref 65–99)
Potassium: 4.5 mmol/L (ref 3.5–5.1)
SODIUM: 140 mmol/L (ref 135–145)

## 2015-06-16 NOTE — Progress Notes (Signed)
..  Dustin Dominguez's reason for visit today is for labs as scheduled per MD orders.  Venipuncture performed with a 23 gauge butterfly needle to R Antecubital.  Dustin Dominguez tolerated procedure well and without incident; questions were answered and patient was discharged.

## 2015-06-23 ENCOUNTER — Encounter: Payer: Self-pay | Admitting: Pharmacist

## 2015-06-23 ENCOUNTER — Ambulatory Visit (INDEPENDENT_AMBULATORY_CARE_PROVIDER_SITE_OTHER): Payer: Medicare Other | Admitting: Pharmacist

## 2015-06-23 ENCOUNTER — Other Ambulatory Visit: Payer: Medicare Other

## 2015-06-23 VITALS — BP 124/66 | HR 64 | Ht 70.0 in | Wt 198.0 lb

## 2015-06-23 DIAGNOSIS — E1140 Type 2 diabetes mellitus with diabetic neuropathy, unspecified: Secondary | ICD-10-CM

## 2015-06-23 DIAGNOSIS — E876 Hypokalemia: Secondary | ICD-10-CM

## 2015-06-23 DIAGNOSIS — Z0000 Encounter for general adult medical examination without abnormal findings: Secondary | ICD-10-CM | POA: Diagnosis not present

## 2015-06-23 MED ORDER — SENNOSIDES-DOCUSATE SODIUM 8.6-50 MG PO TABS: 1.0000 | ORAL_TABLET | ORAL | Status: DC | PRN

## 2015-06-23 MED ORDER — VITAMIN D (CHOLECALCIFEROL) 25 MCG (1000 UT) PO CAPS: 1.0000 | ORAL_CAPSULE | Freq: Every day | ORAL | Status: DC

## 2015-06-23 NOTE — Patient Instructions (Addendum)
Mr. Dustin Dominguez , Thank you for taking time to come for your Medicare Wellness Visit. I appreciate your ongoing commitment to your health goals. Please review the following plan we discussed and let me know if I can assist you in the future.   These are the goals we discussed: Consider Bone Density and Balance Assessment with physical therapy. Try to do Chair Exercises as able - see handout. Continue to limit high sugar and carbohydrate foods - make sure you are eating regular meals and snacks.   Increase non-starchy vegetables - carrots, green bean, squash, zucchini, tomatoes, onions, peppers, spinach and other green leafy vegetables, cabbage, lettuce, cucumbers, asparagus, okra (not fried), eggplant limit sugar and processed foods (cakes, cookies, ice cream, crackers and chips) Increase fresh fruit but limit serving sizes 1/2 cup or about the size of tennis or baseball limit red meat to no more than 1-2 times per week (serving size about the size of your palm) Choose whole grains / lean proteins - whole wheat bread, quinoa, whole grain rice (1/2 cup), fish, chicken, [place]    This is a list of the screening recommended for you and due dates:  Health Maintenance  Topic Date Due  . Pneumonia vaccines (2 of 2 - PPSV23) Completed    . Stool Blood Test  09/27/2015  . Hemoglobin A1C  11/04/2015  . Tetanus Vaccine  01/08/2016  . Flu Shot  02/17/2016  . Eye exam for diabetics  04/05/2016  . Complete foot exam   05/05/2016  . Urine Protein Check  05/05/2016  . Colon Cancer Screening  12/07/2021  . Shingles Vaccine  Completed  *Topic was postponed. The date shown is not the original due date.    Hypoglycemia Hypoglycemia occurs when the glucose in your blood is too low. Glucose is a type of sugar that is your body's main energy source. Hormones, such as insulin and glucagon, control the level of glucose in the blood. Insulin lowers blood glucose and glucagon increases blood glucose. Having too  much insulin in your blood stream, or not eating enough food containing sugar, can result in hypoglycemia. Hypoglycemia can happen to people with or without diabetes. It can develop quickly and can be a medical emergency.  CAUSES   Missing or delaying meals.  Not eating enough carbohydrates at meals.  Taking too much diabetes medicine.  Not timing your oral diabetes medicine or insulin doses with meals, snacks, and exercise.  Nausea and vomiting.  Certain medicines.  Severe illnesses, such as hepatitis, kidney disorders, and certain eating disorders.  Increased activity or exercise without eating something extra or adjusting medicines.  Drinking too much alcohol.  A nerve disorder that affects body functions like your heart rate, blood pressure, and digestion (autonomic neuropathy).  A condition where the stomach muscles do not function properly (gastroparesis). Therefore, medicines and food may not absorb properly.  Rarely, a tumor of the pancreas can produce too much insulin. SYMPTOMS   Hunger.  Sweating (diaphoresis).  Change in body temperature.  Shakiness.  Headache.  Anxiety.  Lightheadedness.  Irritability.  Difficulty concentrating.  Dry mouth.  Tingling or numbness in the hands or feet.  Restless sleep or sleep disturbances.  Altered speech and coordination.  Change in mental status.  Seizures or prolonged convulsions.  Combativeness.  Drowsiness (lethargic).  Weakness.  Increased heart rate or palpitations.  Confusion.  Pale, gray skin color.  Blurred or double vision.  Fainting. DIAGNOSIS  A physical exam and medical history will be performed.  Your caregiver may make a diagnosis based on your symptoms. Blood tests and other lab tests may be performed to confirm a diagnosis. Once the diagnosis is made, your caregiver will see if your signs and symptoms go away once your blood glucose is raised.  TREATMENT  Usually, you can easily  treat your hypoglycemia when you notice symptoms.  Check your blood glucose. If it is less than 70 mg/dl, take one of the following:   3-4 glucose tablets.    cup juice.    cup regular soda.   1 cup skim milk.   -1 tube of glucose gel.   5-6 hard candies.   Avoid high-fat drinks or food that may delay a rise in blood glucose levels.  Do not take more than the recommended amount of sugary foods, drinks, gel, or tablets. Doing so will cause your blood glucose to go too high.   Wait 10-15 minutes and recheck your blood glucose. If it is still less than 70 mg/dl or below your target range, repeat treatment.   Eat a snack if it is more than 1 hour until your next meal.  There may be a time when your blood glucose may go so low that you are unable to treat yourself at home when you start to notice symptoms. You may need someone to help you. You may even faint or be unable to swallow. If you cannot treat yourself, someone will need to bring you to the hospital.  [place]  If you have diabetes, follow your diabetes management plan by:  Taking your medicines as directed.  Following your exercise plan.  Following your meal plan. Do not skip meals. Eat on time.  Testing your blood glucose regularly. Check your blood glucose before and after exercise. If you exercise longer or different than usual, be sure to check blood glucose more frequently.  Wearing your medical alert jewelry that says you have diabetes.  Identify the cause of your hypoglycemia. Then, develop ways to prevent the recurrence of hypoglycemia.  Do not take a hot bath or shower right after an insulin shot.  Always carry treatment with you. Glucose tablets are the easiest to carry.  If you are going to drink alcohol, drink it only with meals.  Tell friends or family members ways to keep you safe during a seizure. This may include removing hard or sharp objects from the area or turning you  on your side.  Maintain a healthy weight. SEEK MEDICAL CARE IF:   You are having problems keeping your blood glucose in your target range.  You are having frequent episodes of hypoglycemia.  You feel you might be having side effects from your medicines.  You are not sure why your blood glucose is dropping so low.  You notice a change in vision or a new problem with your vision. SEEK IMMEDIATE MEDICAL CARE IF:   Confusion develops.  A change in mental status occurs.  The inability to swallow develops.  Fainting occurs.   This information is not intended to replace advice given to you by your health care provider. Make sure you discuss any questions you have with your health care provider.   Document Released: 07/05/2005 Document Revised: 07/10/2013 Document Reviewed: 03/11/2015 Elsevier Interactive Patient Education 2016 [place] in the Home  Falls can cause injuries and can affect people from all age groups. There are many simple things that you can do to make your home safe  and to help prevent falls. WHAT CAN I DO ON THE OUTSIDE OF MY HOME?  Regularly repair the edges of walkways and driveways and fix any cracks.  Remove high doorway thresholds.  Trim any shrubbery on the main path into your home.  Use bright outdoor lighting.  Clear walkways of debris and clutter, including tools and rocks.  Regularly check that handrails are securely fastened and in good repair. Both sides of any steps should have handrails.  Install guardrails along the edges of any raised decks or porches.  Have leaves, snow, and ice cleared regularly.  Use sand or salt on walkways during winter months.  In the garage, clean up any spills right away, including grease or oil spills. WHAT CAN I DO IN THE BATHROOM?  Use night lights.  Install grab bars by the toilet and in the tub and shower. Do not use towel bars as grab bars.  Use non-skid mats or decals on the  floor of the tub or shower.  If you need to sit down while you are in the shower, use a plastic, non-slip stool.Marland Kitchen  Keep the floor dry. Immediately clean up any water that spills on the floor.  Remove soap buildup in the tub or shower on a regular basis.  Attach bath mats securely with double-sided non-slip rug tape.  Remove throw rugs and other tripping hazards from the floor. WHAT CAN I DO IN THE BEDROOM?  Use night lights.  Make sure that a bedside light is easy to reach.  Do not use oversized bedding that drapes onto the floor.  Have a firm chair that has side arms to use for getting dressed.  Remove throw rugs and other tripping hazards from the floor. WHAT CAN I DO IN THE KITCHEN?   Clean up any spills right away.  Avoid walking on wet floors.  Place frequently used items in easy-to-reach places.  If you need to reach for something above you, use a sturdy step stool that has a grab bar.  Keep electrical cables out of the way.  Do not use floor polish or wax that makes floors slippery. If you have to use wax, make sure that it is non-skid floor wax.  Remove throw rugs and other tripping hazards from the floor. WHAT CAN I DO IN THE STAIRWAYS?  Do not leave any items on the stairs.  Make sure that there are handrails on both sides of the stairs. Fix handrails that are broken or loose. Make sure that handrails are as long as the stairways.  Check any carpeting to make sure that it is firmly attached to the stairs. Fix any carpet that is loose or worn.  Avoid having throw rugs at the top or bottom of stairways, or secure the rugs with carpet tape to prevent them from moving.  Make sure that you have a light switch at the top of the stairs and the bottom of the stairs. If you do not have them, have them installed. WHAT ARE SOME OTHER FALL PREVENTION TIPS?  Wear closed-toe shoes that fit well and support your feet. Wear shoes that have rubber soles or low  heels.  When you use a stepladder, make sure that it is completely opened and that the sides are firmly locked. Have someone hold the ladder while you are using it. Do not climb a closed stepladder.  Add color or contrast paint or tape to grab bars and handrails in your home. Place contrasting color strips on the  first and last steps.  Use mobility aids as needed, such as canes, walkers, scooters, and crutches.  Turn on lights if it is dark. Replace any light bulbs that burn out.  Set up furniture so that there are clear paths. Keep the furniture in the same spot.  Fix any uneven floor surfaces.  Choose a carpet design that does not hide the edge of steps of a stairway.  Be aware of any and all pets.  Review your medicines with your healthcare provider. Some medicines can cause dizziness or changes in blood pressure, which increase your risk of falling. Talk with your health care provider about other ways that you can decrease your risk of falls. This may include working with a physical therapist or trainer to improve your strength, balance, and endurance.   This information is not intended to replace advice given to you by your health care provider. Make sure you discuss any questions you have with your health care provider.   Document Released: 06/25/2002 Document Revised: 11/19/2014 Document Reviewed: 08/09/2014 Elsevier Interactive Patient Education [employer].
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
## 2015-06-24 LAB — BMP8+EGFR
BUN/Creatinine Ratio: 19 (ref 10–22)
BUN: 18 mg/dL (ref 8–27)
CALCIUM: 8.7 mg/dL (ref 8.6–10.2)
CO2: 26 mmol/L (ref 18–29)
CREATININE: 0.97 mg/dL (ref 0.76–1.27)
Chloride: 100 mmol/L (ref 97–106)
GFR, EST AFRICAN AMERICAN: 90 mL/min/{1.73_m2} (ref >59)
GFR, EST NON AFRICAN AMERICAN: 78 mL/min/{1.73_m2} (ref >59)
Glucose: 91 mg/dL (ref 65–99)
POTASSIUM: 4.6 mmol/L (ref 3.5–5.2)
Sodium: 142 mmol/L (ref 136–144)

## 2015-06-24 LAB — MICROALBUMIN / CREATININE URINE RATIO
Creatinine, Urine: 126 mg/dL
MICROALB/CREAT RATIO: 166.2 mg/g{creat} — AB (ref 0.0–30.0)
MICROALBUM., U, RANDOM: 209.4 ug/mL

## 2015-06-30 ENCOUNTER — Ambulatory Visit (HOSPITAL_COMMUNITY): Payer: Medicare Other | Admitting: Hematology & Oncology

## 2015-07-03 ENCOUNTER — Other Ambulatory Visit: Payer: Self-pay | Admitting: Family Medicine

## 2015-07-06 ENCOUNTER — Encounter (HOSPITAL_COMMUNITY): Payer: Self-pay | Admitting: Oncology

## 2015-07-06 DIAGNOSIS — D72829 Elevated white blood cell count, unspecified: Secondary | ICD-10-CM | POA: Insufficient documentation
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
## 2015-07-06 NOTE — Assessment & Plan Note (Addendum)
Leukocytosis with normal HGB and normal PLT count and with a lymphocytosis without monoclonality on peripheral flow cytometry.  Peripheral work-up complete with a negative peripheral flow cytometry on 06/10/2015.  Peripheral smear review demonstrated an absolute lymphocytosis.  Labs today: CBC diff  Hypokalemia noted, extraneous to lymphocytosis work-up.  This is secondary to HCTZ use.  He was given Kdur and follow-up labs demonstrate normalization of K+.  Will defer further management to his primary care provider, Dr. Laurance Flatten.  The patient's leukocytosis is likely secondary to his tobacco use and is reactive in nature.  He is encouraged to to refrain from smoking and smoking cessation education is provided today.  He is encouraged to work with his primary care provider on smoking cessation interventions (Chantix, Wellbutrin, Nicoderm patches, behavioral modification, etc.).  Labs in 6 months: CBC diff  He notes a taste change in foods, but denies any weight loss.  Review of vitals confirms stable weight.  Return in 6 months for follow-up.

## 2015-07-07 ENCOUNTER — Encounter (HOSPITAL_COMMUNITY): Payer: Self-pay | Admitting: Oncology

## 2015-07-07 ENCOUNTER — Encounter (HOSPITAL_COMMUNITY): Payer: Medicare Other | Attending: Oncology | Admitting: Oncology

## 2015-07-07 VITALS — BP 144/64 | HR 55 | Temp 97.9°F | Resp 16 | Wt 204.2 lb

## 2015-07-07 DIAGNOSIS — G8929 Other chronic pain: Secondary | ICD-10-CM

## 2015-07-07 DIAGNOSIS — E876 Hypokalemia: Secondary | ICD-10-CM

## 2015-07-07 DIAGNOSIS — M545 Low back pain: Secondary | ICD-10-CM

## 2015-07-07 DIAGNOSIS — F17200 Nicotine dependence, unspecified, uncomplicated: Secondary | ICD-10-CM

## 2015-07-07 DIAGNOSIS — D72829 Elevated white blood cell count, unspecified: Secondary | ICD-10-CM | POA: Diagnosis present

## 2015-07-07 DIAGNOSIS — Z720 Tobacco use: Secondary | ICD-10-CM

## 2015-07-07 LAB — CBC WITH DIFFERENTIAL/PLATELET
Basophils Absolute: 0.1 10*3/uL (ref 0.0–0.1)
Basophils Relative: 1 %
EOS ABS: 0.6 10*3/uL (ref 0.0–0.7)
Eosinophils Relative: 4 %
HEMATOCRIT: 39.7 % (ref 39.0–52.0)
HEMOGLOBIN: 13.5 g/dL (ref 13.0–17.0)
LYMPHS ABS: 5 10*3/uL — AB (ref 0.7–4.0)
Lymphocytes Relative: 34 %
MCH: 31 pg (ref 26.0–34.0)
MCHC: 34 g/dL (ref 30.0–36.0)
MCV: 91.3 fL (ref 78.0–100.0)
MONO ABS: 1.1 10*3/uL — AB (ref 0.1–1.0)
MONOS PCT: 7 %
NEUTROS PCT: 54 %
Neutro Abs: 8.1 10*3/uL — ABNORMAL HIGH (ref 1.7–7.7)
Platelets: 284 10*3/uL (ref 150–400)
RBC: 4.35 MIL/uL (ref 4.22–5.81)
RDW: 13.9 % (ref 11.5–15.5)
WBC: 14.8 10*3/uL — ABNORMAL HIGH (ref 4.0–10.5)

## 2015-07-07 NOTE — Patient Instructions (Signed)
[place] at [facility] Discharge Instructions  RECOMMENDATIONS MADE BY THE CONSULTANT AND ANY TEST RESULTS WILL BE SENT TO YOUR REFERRING PHYSICIAN.  Exam and discussion by Robynn Pane, PA-C Labs today - we will call you with any concerns with your blood work Call with any concerns  Follow-up: Labs, CT of chest and office visit.  Thank you for choosing [place] at [facility] to provide your oncology and hematology care.  To afford each patient quality time with our provider, please arrive at least 15 minutes before your scheduled appointment time.    You need to re-schedule your appointment should you arrive 10 or more minutes late.  We strive to give you quality time with our providers, and arriving late affects you and other patients whose appointments are after yours.  Also, if you no show three or more times for appointments you may be dismissed from the clinic at the providers discretion.     Again, thank you for choosing [facility].  Our hope is that these requests will decrease the amount of time that you wait before being seen by our physicians.       _____________________________________________________________  Should you have questions after your visit to [facility], please contact our office at [phone] between the hours of 8:30 a.m. and 4:30 p.m.  Voicemails left after 4:30 p.m. will not be returned until the following business day.  For prescription refill requests, have your pharmacy contact our office.

## 2015-07-07 NOTE — Assessment & Plan Note (Addendum)
A lung cancer screening counseling and shared decision making visit. Age: 74 Pack year smoking history: 64 Current smoker No current symptoms of lung cancer Risks and benefits of lung cancer screening discussed:  Negative- over-diagnosis, radiation exposure, false positives, and additional testing  Positive- discover early stage lung cancer resulting in higher incidence of cure Patient educated regarding the importance of adherence to continued lung cancer screening. Currently, there are no co-morbidities to prevent treatment to therapy for lung cancer and the patient is agreeable to pursue treatment if a malignancy is discovered.  [place] Preventative Services Task Force recommend annual screening for lung cancer with low-dose CT in adults aged 17- 87 years who have a 30 pack year smoking history and currently smoke or have quit smoking within the past 15 years.  Screening should be discontinued once a person has not smoked for 15 years or develops a health problem that substantially limits life expectancy or the ability or willingness to have curative lung surgery.  It is a category B recommendation.  Similar stances are provided by CMS, NCCN, and AATS.  Currently, there are no co-morbidities to prevent treatment to therapy for lung cancer and the patient is agreeable to pursue treatment if a malignancy is discovered.  CT chest without contrast ordered.

## 2015-07-08 ENCOUNTER — Other Ambulatory Visit: Payer: Self-pay | Admitting: Nurse Practitioner

## 2015-07-12 ENCOUNTER — Other Ambulatory Visit: Payer: Self-pay | Admitting: Family Medicine

## 2015-07-15 ENCOUNTER — Other Ambulatory Visit: Payer: Self-pay | Admitting: Family Medicine

## 2015-07-21 ENCOUNTER — Encounter (HOSPITAL_COMMUNITY): Payer: Self-pay | Admitting: Hematology & Oncology

## 2015-08-04 ENCOUNTER — Other Ambulatory Visit: Payer: Self-pay | Admitting: Family Medicine

## 2015-08-05 NOTE — Telephone Encounter (Signed)
Last seen and last Vit D  05/06/15 38.2  DWM

## 2015-09-01 ENCOUNTER — Other Ambulatory Visit: Payer: Self-pay | Admitting: Family Medicine

## 2015-09-01 DIAGNOSIS — L0100 Impetigo, unspecified: Secondary | ICD-10-CM | POA: Diagnosis not present

## 2015-09-02 NOTE — Telephone Encounter (Signed)
Last seen 05/06/15  DWM  If approved route to nurse to call into [facility]    [phone]

## 2015-09-10 ENCOUNTER — Encounter: Payer: Self-pay | Admitting: Family Medicine

## 2015-09-10 ENCOUNTER — Ambulatory Visit (INDEPENDENT_AMBULATORY_CARE_PROVIDER_SITE_OTHER): Payer: Medicare Other | Admitting: Family Medicine

## 2015-09-10 VITALS — BP 125/70 | HR 58 | Temp 97.7°F | Ht 70.0 in | Wt 204.0 lb

## 2015-09-10 DIAGNOSIS — E1140 Type 2 diabetes mellitus with diabetic neuropathy, unspecified: Secondary | ICD-10-CM

## 2015-09-10 DIAGNOSIS — K219 Gastro-esophageal reflux disease without esophagitis: Secondary | ICD-10-CM

## 2015-09-10 DIAGNOSIS — I2510 Atherosclerotic heart disease of native coronary artery without angina pectoris: Secondary | ICD-10-CM | POA: Diagnosis not present

## 2015-09-10 DIAGNOSIS — E1151 Type 2 diabetes mellitus with diabetic peripheral angiopathy without gangrene: Secondary | ICD-10-CM

## 2015-09-10 DIAGNOSIS — E1159 Type 2 diabetes mellitus with other circulatory complications: Secondary | ICD-10-CM | POA: Diagnosis not present

## 2015-09-10 DIAGNOSIS — N390 Urinary tract infection, site not specified: Secondary | ICD-10-CM | POA: Diagnosis not present

## 2015-09-10 DIAGNOSIS — E782 Mixed hyperlipidemia: Secondary | ICD-10-CM | POA: Diagnosis not present

## 2015-09-10 DIAGNOSIS — I70209 Unspecified atherosclerosis of native arteries of extremities, unspecified extremity: Secondary | ICD-10-CM

## 2015-09-10 DIAGNOSIS — E559 Vitamin D deficiency, unspecified: Secondary | ICD-10-CM | POA: Diagnosis not present

## 2015-09-10 DIAGNOSIS — I10 Essential (primary) hypertension: Secondary | ICD-10-CM | POA: Diagnosis not present

## 2015-09-10 DIAGNOSIS — R1030 Lower abdominal pain, unspecified: Secondary | ICD-10-CM | POA: Diagnosis not present

## 2015-09-10 DIAGNOSIS — R1310 Dysphagia, unspecified: Secondary | ICD-10-CM

## 2015-09-10 LAB — POCT GLYCOSYLATED HEMOGLOBIN (HGB A1C): Hemoglobin A1C: 5.5

## 2015-09-10 NOTE — Patient Instructions (Addendum)
Medicare Annual Wellness Visit  [facility] and the medical providers at [facility] strive to bring you the best medical care.  In doing so we not only want to address your current medical conditions and concerns but also to detect new conditions early and prevent illness, disease and health-related problems.    Medicare offers a yearly Wellness Visit which allows our clinical staff to assess your need for preventative services including immunizations, lifestyle education, counseling to decrease risk of preventable diseases and screening for fall risk and other medical concerns.    This visit is provided free of charge (no copay) for all Medicare recipients. The clinical pharmacists at [facility] have begun to conduct these Wellness Visits which will also include a thorough review of all your medications.    As you primary medical provider recommend that you make an appointment for your Annual Wellness Visit if you have not done so already this year.  You may set up this appointment before you leave today or you may call back [id number]) and schedule an appointment.  Please make sure when you call that you mention that you are scheduling your Annual Wellness Visit with the clinical pharmacist so that the appointment may be made for the proper length of time.     Continue current medications. Continue good therapeutic lifestyle changes which include good diet and exercise. Fall precautions discussed with patient. If an FOBT was given today- please return it to our front desk. If you are over 10 years old - you may need Prevnar 27 or the adult Pneumonia vaccine.  **Flu shots are available--- please call and schedule a FLU-CLINIC appointment**  After your visit with [place] today you will receive a survey in the mail or online from [employer] regarding your care with [place]. Please take a moment to fill this out. Your feedback is very  important to [place] as you can help [place] better understand your patient needs as well as improve your experience and satisfaction. WE CARE ABOUT YOU!!!   The patient should keep his follow-up appointment with his cardiologist, Dr. Domenic Polite We will arrange for him to have a visit with the urologist from [facility] in [facility]. This is to follow-up on his chronic lower abdominal pain and chronic UTI Because of the swallowing difficulties and the trouble with a bad taste in his throat with swallowing we will also arrange for him to have an appointment with a gastroenterologist for possible endoscopy and follow-up of this. He should continue to follow-up with the orthopedist for his pain medication.

## 2015-09-10 NOTE — Progress Notes (Signed)
Subjective:    Patient ID: Dustin Dominguez, male    DOB: [birth year], 75 y.o.   MRN: [id number]  HPI Pt here for follow up and management of chronic medical problems which includes diabetes, hypertension, and hyperlipidemia. He is taking medications regularly. The patient has seen the cardiologist in the past. He is currently seeing a hematologist because of leukocytosis. The patient's biggest complaint today is that it is hard to swallow both physically and that when he swallows he notices a bad taste in his throat. This is been going on for a while. So this seems to be something structurally affecting his swallowing as well as this taste which is bad after swallowing. He has had an endoscopy in the past but did not have this problem at the time. He says he feels like something is hanging in his throat and this is causing the bad taste. This is been going on for 2 months. The patient denies chest pain but does have occasional shortness of breath especially at nighttime. He denies any heartburn or indigestion. He has not seen any blood in the stool or had any black tarry bowel movements. He still has bouts of lower abdominal pain that he attributes to his urinary tract. He has had frequent urinary tract infections with Escherichia coli with no antibiotics treat these. He has not seen a urologist in a long time. He will be given an FOBT to return. He is scheduled for a chest CT in March and we will not do a chest x-ray today. He does not check blood sugars at home.      Patient Active Problem List   Diagnosis Date Noted  . Leukocytosis 07/06/2015  . GERD (gastroesophageal reflux disease) 06/13/2013  . Vitamin D deficiency 06/13/2013  . Degenerative disc disease, cervical 06/13/2013  . Degenerative disc disease, lumbar 06/13/2013  . Multiple thyroid nodules 04/07/2012  . Mixed hyperlipidemia 01/27/2011  . Coronary atherosclerosis of native coronary artery   . Sleep apnea   . Tobacco use  disorder   . Essential hypertension, benign   . 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 amLODipine (NORVASC) 10 MG tablet TAKE 1 TABLET BY MOUTH EVERY DAY  . aspirin 81 MG EC tablet Take 162 mg by mouth daily.   Marland Kitchen atorvastatin (LIPITOR) 20 MG tablet TAKE 1 TABLET BY MOUTH EVERY DAY  . diazepam (VALIUM) 10 MG tablet TAKE 1 TABLET BY MOUTH TWICE DAILY AS NEEDED  . fenofibrate 160 MG tablet TAKE 1 TABLET BY MOUTH DAILY  . fish oil-omega-3 fatty acids 1000 MG capsule Take 2 g by mouth 2 (two) times daily. Takes 2 tablets twice daily  . glimepiride (AMARYL) 2 MG tablet TAKE 1 TABLET BY MOUTH EVERY DAY  . hydrochlorothiazide (MICROZIDE) 12.5 MG capsule TAKE 1 CAPSULE BY MOUTH EVERY DAY  . isosorbide mononitrate (IMDUR) 30 MG 24 hr tablet TAKE 1 TABLET BY MOUTH DAILY  . lactulose (CHRONULAC) 10 GM/15ML solution Take 15 mLs by mouth daily as needed for mild constipation, moderate constipation or severe constipation. Reported on 07/07/2015  . metoprolol (LOPRESSOR) 50 MG tablet TAKE 1/2 TABLET BY MOUTH TWICE DAILY  . morphine (MS CONTIN) 30 MG 12 hr tablet Take 30 mg by mouth 3 (three) times daily.  Marland Kitchen oxyCODONE-acetaminophen (PERCOCET) 10-325 MG per tablet Take 1 tablet by mouth 6 (six) times daily.   . pantoprazole (PROTONIX) 40  MG tablet TAKE 1 TABLET BY MOUTH TWICE DAILY  . potassium chloride SA (K-DUR,KLOR-CON) 20 MEQ tablet Take 40 mEq three times daily for 3 days, then 40 mEq twice daily until labs rechecked on Monday -will advise further after lab results are obtained.  . quinapril (ACCUPRIL) 20 MG tablet TAKE 1 TABLET BY MOUTH EVERY DAY  . senna-docusate (SENOKOT S) 8.6-50 MG tablet Take 1 tablet by mouth as needed for mild constipation.  . Vitamin D, Cholecalciferol, 1000 UNITS CAPS Take 1 capsule by mouth daily.  . Vitamin D, Ergocalciferol, (DRISDOL) 50000 units CAPS capsule TAKE  1 CAPSULE BY MOUTH EVERY WEEK.   No facility-administered encounter medications on file as of 09/10/2015.      Review of Systems  Constitutional: Negative.   HENT: Negative.        Hard to "clear throat"  Eyes: Negative.   Respiratory: Negative.   Cardiovascular: Negative.   Gastrointestinal: Negative.   Endocrine: Negative.   Genitourinary: Negative.   Musculoskeletal: Negative.   Skin: Negative.   Allergic/Immunologic: Negative.   Neurological: Negative.   Hematological: Negative.   Psychiatric/Behavioral: Negative.        Objective:   Physical Exam  Constitutional: He is oriented to person, place, and time. He appears well-nourished.  Pleasant and alert and kyphotic  HENT:  Head: Normocephalic and atraumatic.  Right Ear: External ear normal.  Left Ear: External ear normal.  Nose: Nose normal.  Mouth/Throat: Oropharynx is clear and moist. No oropharyngeal exudate.  The patient has multiple scars from skin cancer removal.  Eyes: Conjunctivae and EOM are normal. Pupils are equal, round, and reactive to light. Right eye exhibits no discharge. Left eye exhibits no discharge. No scleral icterus.  Neck: Normal range of motion. Neck supple. No thyromegaly present.  There is a right supraclavicular bruit  Cardiovascular: Normal rate, regular rhythm, normal heart sounds and intact distal pulses.   No murmur heard. The heart is regular at 60/m  Pulmonary/Chest: Effort normal and breath sounds normal. No respiratory distress. He has no wheezes. He has no rales. He exhibits no tenderness.  Clear  anteriorly and posteriorly  Abdominal: Soft. Bowel sounds are normal. He exhibits distension. He exhibits no mass. There is tenderness. There is no rebound and no guarding.  The patient has a large protuberant belly with multiple scars and it is asymmetric with weakness to the right this is been this way for years. He is tender on the right side and in the right lower quadrant. There are no  bruits and no masses.  Musculoskeletal: He exhibits no edema or tenderness.  The patient's range of motion is limited because of his severe neck and low back pain for which she is receiving pain medicine from the orthopedic surgeon in [place]. 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  Lymphadenopathy:    He has no cervical adenopathy.  Neurological: He is alert and oriented to person, place, and time. He has normal reflexes. No cranial nerve deficit.  Skin: Skin is warm and dry. No rash noted.  Psychiatric: He has a normal mood and affect. His behavior is normal. Judgment and thought content normal.  Nursing note and vitals reviewed.  BP 125/70 mmHg  Pulse 58  Temp(Src) 97.7 F (36.5 C) (Oral)  Ht 5' 10" (1.778 m)  Wt 204 lb (92.534 kg)  BMI 29.27 kg/m2        Assessment & Plan:  1. Type 2 diabetes mellitus with diabetic neuropathy,   without long-term current use of insulin (HCC) -Continue to watch diet closely and try to check occasional blood sugars at home - POCT glycosylated hemoglobin (Hb A1C) - BMP8+EGFR - CBC with Differential/Platelet - Ambulatory referral to Cardiology  2. Mixed hyperlipidemia -Continue current treatment and as aggressive therapeutic lifestyle changes as possible - CBC with Differential/Platelet - Lipid panel - Ambulatory referral to Cardiology  3. Essential hypertension, benign -The blood pressures good today and he will continue with current treatment - BMP8+EGFR - CBC with Differential/Platelet - Hepatic function panel - Ambulatory referral to Cardiology  4. Gastroesophageal reflux disease, esophagitis presence not specified -Appointment with gastroenterology to further evaluate swallowing problems and taste and a sensation that there is something in his throat with swallowing. - CBC with Differential/Platelet - Hepatic function panel - Ambulatory referral to Gastroenterology  5. Vitamin D deficiency -Continue  current treatment pending results of lab work - CBC with Differential/Platelet - VITAMIN D 25 Hydroxy (Vit-D Deficiency, Fractures)  6. ASCVD (arteriosclerotic cardiovascular disease) -Continue follow-up appointment with cardiology - CBC with Differential/Platelet - Lipid panel - Ambulatory referral to Cardiology  7. 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 Trouble swallowing -Appointment with gastroenterology - Thyroid Panel With TSH - Ambulatory referral to Gastroenterology  9. Frequent UTI -Appointment with urology - Ambulatory referral to Urology  10. Lower abdominal pain -appointment with urology - Ambulatory referral to Urology  Patient Instructions                       Medicare Annual Wellness Visit  [facility] and the medical providers at [facility] strive to bring you the best medical care.  In doing so we not only want to address your current medical conditions and concerns but also to detect new conditions early and prevent illness, disease and health-related problems.    Medicare offers a yearly Wellness Visit which allows our clinical staff to assess your need for preventative services including immunizations, lifestyle education, counseling to decrease risk of preventable diseases and screening for fall risk and other medical concerns.    This visit is provided free of charge (no copay) for all Medicare recipients. The clinical pharmacists at [facility] have begun to conduct these Wellness Visits which will also include a thorough review of all your medications.    As you primary medical provider recommend that you make an appointment for your Annual Wellness Visit if you have not done so already this year.  You may set up this appointment before you leave today or you may call back  [phone]) and schedule an appointment.  Please make sure when you call that you mention that you are scheduling your Annual Wellness Visit with the clinical pharmacist so that the appointment may be made for the proper length of time.     Continue current medications. Continue good therapeutic lifestyle changes which include good diet and exercise. Fall precautions discussed with patient. If an FOBT was given today- please return it to our front desk. If you are over 61 years old - you may need Prevnar 45 or the adult Pneumonia vaccine.  **Flu shots are available--- please call and schedule a FLU-CLINIC appointment**  After your visit with [place] today you will receive a survey in the mail or online from [employer] regarding your care with [place]. Please take a moment to fill this  out. Your feedback is very important to us as you can help us better understand your patient needs as well as improve your experience and satisfaction. WE CARE ABOUT YOU!!!   The patient should keep his follow-up appointment with his cardiologist, Dr. McDowell We will arrange for him to have a visit with the urologist from [facility] in [place]. This is to follow-up on his chronic lower abdominal pain and chronic UTI Because of the swallowing difficulties and the trouble with a bad taste in his throat with swallowing we will also arrange for him to have an appointment with a gastroenterologist for possible endoscopy and follow-up of this. He should continue to follow-up with the orthopedist for his pain medication.   Don W. [facility] MD   

## 2015-09-11 DIAGNOSIS — C4442 Squamous cell carcinoma of skin of scalp and neck: Secondary | ICD-10-CM | POA: Diagnosis not present

## 2015-09-11 DIAGNOSIS — C44629 Squamous cell carcinoma of skin of left upper limb, including shoulder: Secondary | ICD-10-CM | POA: Diagnosis not present

## 2015-09-11 DIAGNOSIS — C44222 Squamous cell carcinoma of skin of right ear and external auricular canal: Secondary | ICD-10-CM | POA: Diagnosis not present

## 2015-09-11 LAB — CBC WITH DIFFERENTIAL/PLATELET
Basophils Absolute: 0.1 10*3/uL (ref 0.0–0.2)
Basos: 1 %
EOS (ABSOLUTE): 1 10*3/uL — ABNORMAL HIGH (ref 0.0–0.4)
EOS: 6 %
HEMATOCRIT: 38.6 % (ref 37.5–51.0)
HEMOGLOBIN: 13.6 g/dL (ref 12.6–17.7)
Immature Grans (Abs): 0 10*3/uL (ref 0.0–0.1)
Immature Granulocytes: 0 %
LYMPHS ABS: 7.6 10*3/uL — AB (ref 0.7–3.1)
Lymphs: 46 %
MCH: 31.9 pg (ref 26.6–33.0)
MCHC: 35.2 g/dL (ref 31.5–35.7)
MCV: 90 fL (ref 79–97)
MONOCYTES: 7 %
MONOS ABS: 1.2 10*3/uL — AB (ref 0.1–0.9)
NEUTROS ABS: 6.6 10*3/uL (ref 1.4–7.0)
NRBC: 0 % (ref 0–0)
Neutrophils: 40 %
Platelets: 338 10*3/uL (ref 150–379)
RBC: 4.27 x10E6/uL (ref 4.14–5.80)
RDW: 13.7 % (ref 12.3–15.4)
WBC: 16.5 10*3/uL — AB (ref 3.4–10.8)

## 2015-09-11 LAB — BMP8+EGFR
BUN / CREAT RATIO: 16 (ref 10–22)
BUN: 14 mg/dL (ref 8–27)
CO2: 27 mmol/L (ref 18–29)
CREATININE: 0.88 mg/dL (ref 0.76–1.27)
Calcium: 8.8 mg/dL (ref 8.6–10.2)
Chloride: 102 mmol/L (ref 96–106)
GFR calc Af Amer: 99 mL/min/{1.73_m2} (ref >59)
GFR calc non Af Amer: 85 mL/min/{1.73_m2} (ref >59)
GLUCOSE: 83 mg/dL (ref 65–99)
Potassium: 4.1 mmol/L (ref 3.5–5.2)
SODIUM: 145 mmol/L — AB (ref 134–144)

## 2015-09-11 LAB — THYROID PANEL WITH TSH
Free Thyroxine Index: 1.8 (ref 1.2–4.9)
T3 UPTAKE RATIO: 22 % — AB (ref 24–39)
T4 TOTAL: 8.4 ug/dL (ref 4.5–12.0)
TSH: 1.64 u[IU]/mL (ref 0.450–4.500)

## 2015-09-11 LAB — LIPID PANEL
CHOLESTEROL TOTAL: 138 mg/dL (ref 100–199)
Chol/HDL Ratio: 4.6 ratio units (ref 0.0–5.0)
HDL: 30 mg/dL — ABNORMAL LOW (ref >39)
LDL CALC: 65 mg/dL (ref 0–99)
TRIGLYCERIDES: 216 mg/dL — AB (ref 0–149)
VLDL Cholesterol Cal: 43 mg/dL — ABNORMAL HIGH (ref 5–40)

## 2015-09-11 LAB — HEPATIC FUNCTION PANEL
ALK PHOS: 39 IU/L (ref 39–117)
ALT: 22 IU/L (ref 0–44)
AST: 24 IU/L (ref 0–40)
Albumin: 4 g/dL (ref 3.5–4.8)
BILIRUBIN, DIRECT: 0.19 mg/dL (ref 0.00–0.40)
Bilirubin Total: 0.5 mg/dL (ref 0.0–1.2)
Total Protein: 6.8 g/dL (ref 6.0–8.5)

## 2015-09-11 LAB — VITAMIN D 25 HYDROXY (VIT D DEFICIENCY, FRACTURES): Vit D, 25-Hydroxy: 40.8 ng/mL (ref 30.0–100.0)

## 2015-09-17 ENCOUNTER — Encounter (INDEPENDENT_AMBULATORY_CARE_PROVIDER_SITE_OTHER): Payer: Self-pay | Admitting: *Deleted

## 2015-09-18 DIAGNOSIS — M5136 Other intervertebral disc degeneration, lumbar region: Secondary | ICD-10-CM | POA: Diagnosis not present

## 2015-09-18 DIAGNOSIS — G894 Chronic pain syndrome: Secondary | ICD-10-CM | POA: Diagnosis not present

## 2015-09-18 DIAGNOSIS — M542 Cervicalgia: Secondary | ICD-10-CM | POA: Diagnosis not present

## 2015-09-18 DIAGNOSIS — M5441 Lumbago with sciatica, right side: Secondary | ICD-10-CM | POA: Diagnosis not present

## 2015-09-26 ENCOUNTER — Ambulatory Visit (HOSPITAL_COMMUNITY): Admission: RE | Admit: 2015-09-26 | Payer: Medicare Other | Source: Ambulatory Visit

## 2015-09-26 ENCOUNTER — Ambulatory Visit (HOSPITAL_COMMUNITY): Payer: Medicare Other

## 2015-09-26 ENCOUNTER — Ambulatory Visit (HOSPITAL_COMMUNITY)
Admission: RE | Admit: 2015-09-26 | Discharge: 2015-09-26 | Disposition: A | Discharge status: Home / Self Care | Payer: Medicare Other | Source: Ambulatory Visit | Attending: Oncology | Admitting: Oncology

## 2015-09-26 DIAGNOSIS — J432 Centrilobular emphysema: Secondary | ICD-10-CM | POA: Insufficient documentation

## 2015-09-26 DIAGNOSIS — F17210 Nicotine dependence, cigarettes, uncomplicated: Secondary | ICD-10-CM | POA: Insufficient documentation

## 2015-09-26 DIAGNOSIS — I289 Disease of pulmonary vessels, unspecified: Secondary | ICD-10-CM | POA: Diagnosis not present

## 2015-09-26 DIAGNOSIS — Z122 Encounter for screening for malignant neoplasm of respiratory organs: Secondary | ICD-10-CM | POA: Diagnosis not present

## 2015-09-26 DIAGNOSIS — R918 Other nonspecific abnormal finding of lung field: Secondary | ICD-10-CM | POA: Insufficient documentation

## 2015-09-26 DIAGNOSIS — F17200 Nicotine dependence, unspecified, uncomplicated: Secondary | ICD-10-CM

## 2015-10-07 ENCOUNTER — Ambulatory Visit (INDEPENDENT_AMBULATORY_CARE_PROVIDER_SITE_OTHER): Payer: Medicare Other | Admitting: Internal Medicine

## 2015-10-10 ENCOUNTER — Other Ambulatory Visit: Payer: Self-pay | Admitting: Family Medicine

## 2015-10-21 ENCOUNTER — Other Ambulatory Visit: Payer: Self-pay | Admitting: Family Medicine

## 2015-10-21 NOTE — Telephone Encounter (Signed)
Last seen and last Vit D 09/10/15  DWM  40.8

## 2015-10-28 ENCOUNTER — Encounter (INDEPENDENT_AMBULATORY_CARE_PROVIDER_SITE_OTHER): Payer: Self-pay | Admitting: *Deleted

## 2015-10-28 ENCOUNTER — Ambulatory Visit (INDEPENDENT_AMBULATORY_CARE_PROVIDER_SITE_OTHER): Payer: Medicare Other | Admitting: Internal Medicine

## 2015-11-04 ENCOUNTER — Other Ambulatory Visit: Payer: Self-pay | Admitting: Family Medicine

## 2015-11-04 NOTE — Telephone Encounter (Signed)
Last seen 09/10/15  DWM  If approved route to nurse to call int [facility]  [phone]

## 2015-11-07 ENCOUNTER — Other Ambulatory Visit: Payer: Self-pay | Admitting: Family Medicine

## 2015-11-11 ENCOUNTER — Telehealth: Payer: Self-pay | Admitting: Cardiology

## 2015-11-11 ENCOUNTER — Encounter: Payer: Self-pay | Admitting: *Deleted

## 2015-11-11 ENCOUNTER — Other Ambulatory Visit: Payer: Self-pay | Admitting: Cardiology

## 2015-11-11 ENCOUNTER — Ambulatory Visit (INDEPENDENT_AMBULATORY_CARE_PROVIDER_SITE_OTHER): Payer: Medicare Other | Admitting: Cardiology

## 2015-11-11 ENCOUNTER — Encounter: Payer: Self-pay | Admitting: Cardiology

## 2015-11-11 ENCOUNTER — Other Ambulatory Visit: Payer: Self-pay | Admitting: Family Medicine

## 2015-11-11 VITALS — BP 132/74 | HR 73 | Ht 71.0 in | Wt 202.0 lb

## 2015-11-11 DIAGNOSIS — I25119 Atherosclerotic heart disease of native coronary artery with unspecified angina pectoris: Secondary | ICD-10-CM

## 2015-11-11 DIAGNOSIS — I10 Essential (primary) hypertension: Secondary | ICD-10-CM

## 2015-11-11 DIAGNOSIS — R0600 Dyspnea, unspecified: Secondary | ICD-10-CM

## 2015-11-11 DIAGNOSIS — Z01812 Encounter for preprocedural laboratory examination: Secondary | ICD-10-CM

## 2015-11-11 DIAGNOSIS — R0609 Other forms of dyspnea: Secondary | ICD-10-CM

## 2015-11-11 DIAGNOSIS — I200 Unstable angina: Secondary | ICD-10-CM | POA: Diagnosis not present

## 2015-11-11 DIAGNOSIS — E785 Hyperlipidemia, unspecified: Secondary | ICD-10-CM

## 2015-11-11 NOTE — Telephone Encounter (Signed)
NO no precert required
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
## 2015-11-11 NOTE — Telephone Encounter (Signed)
*   Left heart cath - Thursday, 4/27 - 7:30 - [facility]  Checking percert

## 2015-11-11 NOTE — Patient Instructions (Signed)
Your physician has requested that you have a cardiac catheterization. Cardiac catheterization is used to diagnose and/or treat various heart conditions. Doctors may recommend this procedure for a number of different reasons. The most common reason is to evaluate chest pain. Chest pain can be a symptom of coronary artery disease (CAD), and cardiac catheterization can show whether plaque is narrowing or blocking your heart's arteries. This procedure is also used to evaluate the valves, as well as measure the blood flow and oxygen levels in different parts of your heart. For further information please visit [email or web address]. Please follow instruction sheet, as given. Continue all current medications. Labs for BMET, CBC, PT, PTT - orders given today. Follow up will be given at time of discharge.

## 2015-11-12 DIAGNOSIS — R0609 Other forms of dyspnea: Secondary | ICD-10-CM | POA: Diagnosis not present

## 2015-11-12 DIAGNOSIS — Z01812 Encounter for preprocedural laboratory examination: Secondary | ICD-10-CM | POA: Diagnosis not present

## 2015-11-12 DIAGNOSIS — I200 Unstable angina: Secondary | ICD-10-CM | POA: Diagnosis not present

## 2015-11-12 DIAGNOSIS — I10 Essential (primary) hypertension: Secondary | ICD-10-CM | POA: Diagnosis not present

## 2015-11-12 LAB — PROTIME-INR

## 2015-11-13 ENCOUNTER — Encounter (HOSPITAL_COMMUNITY): Payer: Self-pay | Admitting: Interventional Cardiology

## 2015-11-13 ENCOUNTER — Encounter (HOSPITAL_COMMUNITY)
Admission: RE | Disposition: A | Discharge status: Home / Self Care | Payer: Self-pay | Source: Ambulatory Visit | Attending: Interventional Cardiology

## 2015-11-13 ENCOUNTER — Ambulatory Visit (HOSPITAL_COMMUNITY)
Admission: RE | Admit: 2015-11-13 | Discharge: 2015-11-13 | Disposition: A | Discharge status: Home / Self Care | Payer: Medicare Other | Source: Ambulatory Visit | Attending: Interventional Cardiology | Admitting: Interventional Cardiology

## 2015-11-13 DIAGNOSIS — N400 Enlarged prostate without lower urinary tract symptoms: Secondary | ICD-10-CM | POA: Diagnosis not present

## 2015-11-13 DIAGNOSIS — G4730 Sleep apnea, unspecified: Secondary | ICD-10-CM | POA: Diagnosis not present

## 2015-11-13 DIAGNOSIS — Z880 Allergy status to penicillin: Secondary | ICD-10-CM | POA: Diagnosis not present

## 2015-11-13 DIAGNOSIS — E559 Vitamin D deficiency, unspecified: Secondary | ICD-10-CM | POA: Diagnosis not present

## 2015-11-13 DIAGNOSIS — I25119 Atherosclerotic heart disease of native coronary artery with unspecified angina pectoris: Secondary | ICD-10-CM | POA: Insufficient documentation

## 2015-11-13 DIAGNOSIS — G8929 Other chronic pain: Secondary | ICD-10-CM | POA: Diagnosis not present

## 2015-11-13 DIAGNOSIS — E7800 Pure hypercholesterolemia, unspecified: Secondary | ICD-10-CM | POA: Insufficient documentation

## 2015-11-13 DIAGNOSIS — F17210 Nicotine dependence, cigarettes, uncomplicated: Secondary | ICD-10-CM | POA: Insufficient documentation

## 2015-11-13 DIAGNOSIS — Z7982 Long term (current) use of aspirin: Secondary | ICD-10-CM | POA: Insufficient documentation

## 2015-11-13 DIAGNOSIS — E782 Mixed hyperlipidemia: Secondary | ICD-10-CM | POA: Diagnosis not present

## 2015-11-13 DIAGNOSIS — Z85820 Personal history of malignant melanoma of skin: Secondary | ICD-10-CM | POA: Insufficient documentation

## 2015-11-13 DIAGNOSIS — F419 Anxiety disorder, unspecified: Secondary | ICD-10-CM | POA: Insufficient documentation

## 2015-11-13 DIAGNOSIS — I10 Essential (primary) hypertension: Secondary | ICD-10-CM | POA: Insufficient documentation

## 2015-11-13 DIAGNOSIS — I252 Old myocardial infarction: Secondary | ICD-10-CM | POA: Diagnosis not present

## 2015-11-13 DIAGNOSIS — K219 Gastro-esophageal reflux disease without esophagitis: Secondary | ICD-10-CM | POA: Diagnosis not present

## 2015-11-13 DIAGNOSIS — Z8711 Personal history of peptic ulcer disease: Secondary | ICD-10-CM | POA: Insufficient documentation

## 2015-11-13 DIAGNOSIS — Z951 Presence of aortocoronary bypass graft: Secondary | ICD-10-CM | POA: Insufficient documentation

## 2015-11-13 DIAGNOSIS — E119 Type 2 diabetes mellitus without complications: Secondary | ICD-10-CM | POA: Diagnosis not present

## 2015-11-13 HISTORY — PX: CARDIAC CATHETERIZATION: SHX172

## 2015-11-13 LAB — GLUCOSE, CAPILLARY
GLUCOSE-CAPILLARY: 123 mg/dL — AB (ref 65–99)
GLUCOSE-CAPILLARY: 103 mg/dL — AB (ref 65–99)

## 2015-11-13 SURGERY — LEFT HEART CATH AND CORS/GRAFTS ANGIOGRAPHY

## 2015-11-13 MED ORDER — SODIUM CHLORIDE 0.9 % IV SOLN: 250.0000 mL | INTRAVENOUS | Status: DC | PRN

## 2015-11-13 MED ORDER — FENTANYL CITRATE (PF) 100 MCG/2ML IJ SOLN
INTRAMUSCULAR | Status: AC
  Filled 2015-11-13: qty 2

## 2015-11-13 MED ORDER — HEPARIN SODIUM (PORCINE) 1000 UNIT/ML IJ SOLN
INTRAMUSCULAR | Status: DC | PRN
  Administered 2015-11-13: 5000 [IU] via INTRAVENOUS

## 2015-11-13 MED ORDER — MIDAZOLAM HCL 2 MG/2ML IJ SOLN
INTRAMUSCULAR | Status: AC
  Filled 2015-11-13: qty 2

## 2015-11-13 MED ORDER — SODIUM CHLORIDE 0.9 % WEIGHT BASED INFUSION: 1.0000 mL/kg/h | INTRAVENOUS | Status: DC

## 2015-11-13 MED ORDER — VERAPAMIL HCL 2.5 MG/ML IV SOLN
INTRAVENOUS | Status: AC
  Filled 2015-11-13: qty 2

## 2015-11-13 MED ORDER — SODIUM CHLORIDE 0.9% FLUSH: 3.0000 mL | INTRAVENOUS | Status: DC | PRN

## 2015-11-13 MED ORDER — LIDOCAINE HCL (PF) 1 % IJ SOLN
INTRAMUSCULAR | Status: AC
  Filled 2015-11-13: qty 30

## 2015-11-13 MED ORDER — LIDOCAINE HCL (PF) 1 % IJ SOLN
INTRAMUSCULAR | Status: DC | PRN
  Administered 2015-11-13: 4 mL via SUBCUTANEOUS

## 2015-11-13 MED ORDER — SODIUM CHLORIDE 0.9% FLUSH: 3.0000 mL | Freq: Two times a day (BID) | INTRAVENOUS | Status: DC

## 2015-11-13 MED ORDER — HEPARIN (PORCINE) IN NACL 2-0.9 UNIT/ML-% IJ SOLN
INTRAMUSCULAR | Status: AC
  Filled 2015-11-13: qty 1000

## 2015-11-13 MED ORDER — HEPARIN (PORCINE) IN NACL 2-0.9 UNIT/ML-% IJ SOLN
INTRAMUSCULAR | Status: DC | PRN
  Administered 2015-11-13: 08:00:00

## 2015-11-13 MED ORDER — FENTANYL CITRATE (PF) 100 MCG/2ML IJ SOLN
INTRAMUSCULAR | Status: DC | PRN
  Administered 2015-11-13 (×2): 25 ug via INTRAVENOUS

## 2015-11-13 MED ORDER — VERAPAMIL HCL 2.5 MG/ML IV SOLN
INTRAVENOUS | Status: DC | PRN
  Administered 2015-11-13: 08:00:00 via INTRA_ARTERIAL

## 2015-11-13 MED ORDER — ASPIRIN 81 MG PO CHEW
CHEWABLE_TABLET | ORAL | Status: AC
  Filled 2015-11-13: qty 1

## 2015-11-13 MED ORDER — IOPAMIDOL (ISOVUE-370) INJECTION 76%
INTRAVENOUS | Status: DC | PRN
  Administered 2015-11-13: 110 mL

## 2015-11-13 MED ORDER — MIDAZOLAM HCL 2 MG/2ML IJ SOLN
INTRAMUSCULAR | Status: DC | PRN
  Administered 2015-11-13: 1 mg via INTRAVENOUS

## 2015-11-13 MED ORDER — SODIUM CHLORIDE 0.9 % IV SOLN
INTRAVENOUS | Status: DC
  Administered 2015-11-13: 07:00:00 via INTRAVENOUS

## 2015-11-13 MED ORDER — IOPAMIDOL (ISOVUE-370) INJECTION 76%
INTRAVENOUS | Status: AC
  Filled 2015-11-13: qty 125

## 2015-11-13 MED ORDER — ASPIRIN 81 MG PO CHEW
81.0000 mg | CHEWABLE_TABLET | ORAL | Status: AC
  Administered 2015-11-13: 81 mg via ORAL

## 2015-11-13 MED ORDER — HEPARIN SODIUM (PORCINE) 1000 UNIT/ML IJ SOLN
INTRAMUSCULAR | Status: AC
  Filled 2015-11-13: qty 1

## 2015-11-13 SURGICAL SUPPLY — 12 items
CATH INFINITI 5 FR IM (CATHETERS) ×1 IMPLANT
CATH INFINITI 5 FR LCB (CATHETERS) ×1 IMPLANT
CATH INFINITI 5FR MULTPACK ANG (CATHETERS) ×1 IMPLANT
DEVICE RAD COMP TR BAND LRG (VASCULAR PRODUCTS) ×1 IMPLANT
GLIDESHEATH SLEND SS 6F .021 (SHEATH) ×1 IMPLANT
KIT HEART LEFT (KITS) ×2 IMPLANT
PACK CARDIAC CATHETERIZATION (CUSTOM PROCEDURE TRAY) ×2 IMPLANT
SYR MEDRAD MARK V 150ML (SYRINGE) ×2 IMPLANT
TRANSDUCER W/STOPCOCK (MISCELLANEOUS) ×2 IMPLANT
TUBING CIL FLEX 10 FLL-RA (TUBING) ×2 IMPLANT
WIRE HI TORQ VERSACORE-J 145CM (WIRE) ×1 IMPLANT
WIRE SAFE-T 1.5MM-J .035X260CM (WIRE) ×1 IMPLANT

## 2015-11-13 NOTE — Interval H&P Note (Signed)
Cath Lab Visit (complete for each Cath Lab visit)  Clinical Evaluation Leading to the Procedure:   ACS: No.  Non-ACS:    Anginal Classification: CCS III  Anti-ischemic medical therapy: Minimal Therapy (1 class of medications)  Non-Invasive Test Results: No non-invasive testing performed  Prior CABG: Previous CABG      History and Physical Interval Note:  11/13/2015 7:37 AM  Dustin Dominguez  has presented today for surgery, with the diagnosis of angina  The various methods of treatment have been discussed with the patient and family. After consideration of risks, benefits and other options for treatment, the patient has consented to  Procedure(s): Left Heart Cath and Cors/Grafts Angiography (N/A) as a surgical intervention .  The patient's history has been reviewed, patient examined, no change in status, stable for surgery.  I have reviewed the patient's chart and labs.  Questions were answered to the patient's satisfaction.     Dustin Murnane S.

## 2015-11-13 NOTE — Discharge Instructions (Signed)
Radial Site Care °Refer to this sheet in the next few weeks. These instructions provide you with information about caring for yourself after your procedure. Your health care provider may also give you more specific instructions. Your treatment has been planned according to current medical practices, but problems sometimes occur. Call your health care provider if you have any problems or questions after your procedure. °WHAT TO EXPECT AFTER THE PROCEDURE °After your procedure, it is typical to have the following: °· Bruising at the radial site that usually fades within 1-2 weeks. °· Blood collecting in the tissue (hematoma) that may be painful to the touch. It should usually decrease in size and tenderness within 1-2 weeks. °HOME CARE INSTRUCTIONS °· Take medicines only as directed by your health care provider. °· You may shower 24-48 hours after the procedure or as directed by your health care provider. Remove the bandage (dressing) and gently wash the site with plain soap and water. Pat the area dry with a clean towel. Do not rub the site, because this may cause bleeding. °· Do not take baths, swim, or use a hot tub until your health care provider approves. °· Check your insertion site every day for redness, swelling, or drainage. °· Do not apply powder or lotion to the site. °· Do not flex or bend the affected arm for 24 hours or as directed by your health care provider. °· Do not push or pull heavy objects with the affected arm for 24 hours or as directed by your health care provider. °· Do not lift over 10 lb (4.5 kg) for 5 days after your procedure or as directed by your health care provider. °· Ask your health care provider when it is okay to: °¨ Return to work or school. °¨ Resume usual physical activities or sports. °¨ Resume sexual activity. °· Do not drive home if you are discharged the same day as the procedure. Have someone else drive you. °· You may drive 24 hours after the procedure unless otherwise  instructed by your health care provider. °· Do not operate machinery or power tools for 24 hours after the procedure. °· If your procedure was done as an outpatient procedure, which means that you went home the same day as your procedure, a responsible adult should be with you for the first 24 hours after you arrive home. °· Keep all follow-up visits as directed by your health care provider. This is important. °SEEK MEDICAL CARE IF: °· You have a fever. °· You have chills. °· You have increased bleeding from the radial site. Hold pressure on the site. °SEEK IMMEDIATE MEDICAL CARE IF: °· You have unusual pain at the radial site. °· You have redness, warmth, or swelling at the radial site. °· You have drainage (other than a small amount of blood on the dressing) from the radial site. °· The radial site is bleeding, and the bleeding does not stop after 30 minutes of holding steady pressure on the site. °· Your arm or hand becomes pale, cool, tingly, or numb. °  °This information is not intended to replace advice given to you by your health care provider. Make sure you discuss any questions you have with your health care provider. °  °Document Released: 08/07/2010 Document Revised: 07/26/2014 Document Reviewed: 01/21/2014 °Elsevier Interactive Patient Education ©2016 [employer] ° °
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
## 2015-11-18 DIAGNOSIS — N3000 Acute cystitis without hematuria: Secondary | ICD-10-CM | POA: Diagnosis not present

## 2015-11-18 DIAGNOSIS — B9620 Unspecified Escherichia coli [E. coli] as the cause of diseases classified elsewhere: Secondary | ICD-10-CM | POA: Diagnosis not present

## 2015-11-18 DIAGNOSIS — N390 Urinary tract infection, site not specified: Secondary | ICD-10-CM | POA: Diagnosis not present

## 2015-11-18 DIAGNOSIS — Z125 Encounter for screening for malignant neoplasm of prostate: Secondary | ICD-10-CM | POA: Diagnosis not present

## 2015-11-18 DIAGNOSIS — Z0000 Encounter for general adult medical examination without abnormal findings: Secondary | ICD-10-CM | POA: Diagnosis not present

## 2015-11-24 ENCOUNTER — Ambulatory Visit (INDEPENDENT_AMBULATORY_CARE_PROVIDER_SITE_OTHER): Payer: Medicare Other | Admitting: Internal Medicine

## 2015-11-28 DIAGNOSIS — M545 Low back pain: Secondary | ICD-10-CM | POA: Diagnosis not present

## 2015-11-28 DIAGNOSIS — G894 Chronic pain syndrome: Secondary | ICD-10-CM | POA: Diagnosis not present

## 2015-11-28 DIAGNOSIS — Z79891 Long term (current) use of opiate analgesic: Secondary | ICD-10-CM | POA: Diagnosis not present

## 2015-12-17 DIAGNOSIS — L570 Actinic keratosis: Secondary | ICD-10-CM | POA: Diagnosis not present

## 2016-01-02 DIAGNOSIS — M546 Pain in thoracic spine: Secondary | ICD-10-CM | POA: Diagnosis not present

## 2016-01-02 DIAGNOSIS — M545 Low back pain: Secondary | ICD-10-CM | POA: Diagnosis not present

## 2016-01-02 DIAGNOSIS — Z79891 Long term (current) use of opiate analgesic: Secondary | ICD-10-CM | POA: Diagnosis not present

## 2016-01-02 DIAGNOSIS — G894 Chronic pain syndrome: Secondary | ICD-10-CM | POA: Diagnosis not present

## 2016-01-02 DIAGNOSIS — G8929 Other chronic pain: Secondary | ICD-10-CM | POA: Diagnosis not present

## 2016-01-05 ENCOUNTER — Ambulatory Visit (HOSPITAL_COMMUNITY): Payer: Medicare Other | Admitting: Hematology & Oncology

## 2016-01-05 ENCOUNTER — Ambulatory Visit (HOSPITAL_COMMUNITY): Payer: Medicare Other | Admitting: Oncology

## 2016-01-05 ENCOUNTER — Other Ambulatory Visit (HOSPITAL_COMMUNITY): Payer: Medicare Other

## 2016-01-06 ENCOUNTER — Other Ambulatory Visit: Payer: Self-pay | Admitting: Family Medicine

## 2016-01-06 NOTE — Telephone Encounter (Signed)
Last Vit D 09/10/15 DW[facility]  40.8

## 2016-01-07 ENCOUNTER — Other Ambulatory Visit: Payer: Self-pay | Admitting: Family Medicine

## 2016-01-07 NOTE — Telephone Encounter (Signed)
rx called into pharmacy

## 2016-01-07 NOTE — Telephone Encounter (Signed)
Last filled 12/04/15, last seen 09/10/15. Call in

## 2016-01-15 DIAGNOSIS — M5442 Lumbago with sciatica, left side: Secondary | ICD-10-CM | POA: Diagnosis not present

## 2016-01-15 DIAGNOSIS — M5441 Lumbago with sciatica, right side: Secondary | ICD-10-CM | POA: Diagnosis not present

## 2016-01-15 DIAGNOSIS — G8929 Other chronic pain: Secondary | ICD-10-CM | POA: Diagnosis not present

## 2016-01-15 DIAGNOSIS — M545 Low back pain: Secondary | ICD-10-CM | POA: Diagnosis not present

## 2016-01-15 DIAGNOSIS — M5136 Other intervertebral disc degeneration, lumbar region: Secondary | ICD-10-CM | POA: Diagnosis not present

## 2016-01-16 ENCOUNTER — Ambulatory Visit (INDEPENDENT_AMBULATORY_CARE_PROVIDER_SITE_OTHER): Payer: Medicare Other | Admitting: Family Medicine

## 2016-01-16 ENCOUNTER — Encounter: Payer: Self-pay | Admitting: Family Medicine

## 2016-01-16 VITALS — BP 125/67 | HR 64 | Temp 98.1°F | Ht 71.0 in | Wt 204.0 lb

## 2016-01-16 DIAGNOSIS — E1159 Type 2 diabetes mellitus with other circulatory complications: Secondary | ICD-10-CM | POA: Diagnosis not present

## 2016-01-16 DIAGNOSIS — E782 Mixed hyperlipidemia: Secondary | ICD-10-CM | POA: Diagnosis not present

## 2016-01-16 DIAGNOSIS — E559 Vitamin D deficiency, unspecified: Secondary | ICD-10-CM

## 2016-01-16 DIAGNOSIS — I70209 Unspecified atherosclerosis of native arteries of extremities, unspecified extremity: Secondary | ICD-10-CM | POA: Diagnosis not present

## 2016-01-16 DIAGNOSIS — I739 Peripheral vascular disease, unspecified: Secondary | ICD-10-CM

## 2016-01-16 DIAGNOSIS — R300 Dysuria: Secondary | ICD-10-CM

## 2016-01-16 DIAGNOSIS — I10 Essential (primary) hypertension: Secondary | ICD-10-CM | POA: Diagnosis not present

## 2016-01-16 DIAGNOSIS — E1151 Type 2 diabetes mellitus with diabetic peripheral angiopathy without gangrene: Secondary | ICD-10-CM

## 2016-01-16 DIAGNOSIS — M5136 Other intervertebral disc degeneration, lumbar region: Secondary | ICD-10-CM | POA: Diagnosis not present

## 2016-01-16 DIAGNOSIS — M5030 Other cervical disc degeneration, unspecified cervical region: Secondary | ICD-10-CM

## 2016-01-16 DIAGNOSIS — I2510 Atherosclerotic heart disease of native coronary artery without angina pectoris: Secondary | ICD-10-CM | POA: Diagnosis not present

## 2016-01-16 DIAGNOSIS — K219 Gastro-esophageal reflux disease without esophagitis: Secondary | ICD-10-CM

## 2016-01-16 DIAGNOSIS — E1140 Type 2 diabetes mellitus with diabetic neuropathy, unspecified: Secondary | ICD-10-CM | POA: Diagnosis not present

## 2016-01-16 LAB — URINALYSIS, COMPLETE
Bilirubin, UA: NEGATIVE
GLUCOSE, UA: NEGATIVE
Nitrite, UA: POSITIVE — AB
RBC, UA: NEGATIVE
Specific Gravity, UA: 1.02 (ref 1.005–1.030)
Urobilinogen, Ur: 1 mg/dL (ref 0.2–1.0)
pH, UA: 5 (ref 5.0–7.5)

## 2016-01-16 LAB — MICROSCOPIC EXAMINATION
RBC, UA: NONE SEEN /hpf (ref 0–?)
WBC, UA: >30 /hpf — AB (ref 0–?)

## 2016-01-16 NOTE — Patient Instructions (Addendum)
Medicare Annual Wellness Visit  [facility] and the medical providers at [facility] strive to bring you the best medical care.  In doing so we not only want to address your current medical conditions and concerns but also to detect new conditions early and prevent illness, disease and health-related problems.    Medicare offers a yearly Wellness Visit which allows our clinical staff to assess your need for preventative services including immunizations, lifestyle education, counseling to decrease risk of preventable diseases and screening for fall risk and other medical concerns.    This visit is provided free of charge (no copay) for all Medicare recipients. The clinical pharmacists at [facility] have begun to conduct these Wellness Visits which will also include a thorough review of all your medications.    As you primary medical provider recommend that you make an appointment for your Annual Wellness Visit if you have not done so already this year.  You may set up this appointment before you leave today or you may call back [phone]) and schedule an appointment.  Please make sure when you call that you mention that you are scheduling your Annual Wellness Visit with the clinical pharmacist so that the appointment may be made for the proper length of time.     Continue current medications. Continue good therapeutic lifestyle changes which include good diet and exercise. Fall precautions discussed with patient. If an FOBT was given today- please return it to our front desk. If you are over 73 years old - you may need Prevnar 63 or the adult Pneumonia vaccine.  **Flu shots are available--- please call and schedule a FLU-CLINIC appointment**  After your visit with [place] today you will receive a survey in the mail or online from [employer] regarding your care with [place]. Please take a moment to fill this out. Your feedback is very  important to [place] as you can help [place] better understand your patient needs as well as improve your experience and satisfaction. WE CARE ABOUT YOU!!!   The patient should continue to follow-up with his pain management doctor as needed He should see his cardiologist as planned He should follow-up with urology as planned We will call with lab results and culture reports as soon as those reports become available It is important that the patient get his eye exam done.
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
## 2016-01-20 LAB — URINE CULTURE

## 2016-01-21 ENCOUNTER — Telehealth: Payer: Self-pay | Admitting: Family Medicine

## 2016-01-21 MED ORDER — DOXYCYCLINE HYCLATE 100 MG PO TABS: 100.0000 mg | ORAL_TABLET | Freq: Two times a day (BID) | ORAL | Status: DC

## 2016-01-21 NOTE — Telephone Encounter (Signed)
lmtcb

## 2016-01-21 NOTE — Addendum Note (Signed)
Addended by: Karle Plumber on: 01/21/2016 09:32 AM   Modules accepted: Orders

## 2016-01-26 ENCOUNTER — Telehealth: Payer: Self-pay | Admitting: *Deleted

## 2016-01-26 ENCOUNTER — Other Ambulatory Visit: Payer: Medicare Other

## 2016-01-26 DIAGNOSIS — I10 Essential (primary) hypertension: Secondary | ICD-10-CM

## 2016-01-26 DIAGNOSIS — R300 Dysuria: Secondary | ICD-10-CM | POA: Diagnosis not present

## 2016-01-26 DIAGNOSIS — E559 Vitamin D deficiency, unspecified: Secondary | ICD-10-CM

## 2016-01-26 DIAGNOSIS — I2510 Atherosclerotic heart disease of native coronary artery without angina pectoris: Secondary | ICD-10-CM | POA: Diagnosis not present

## 2016-01-26 DIAGNOSIS — E1140 Type 2 diabetes mellitus with diabetic neuropathy, unspecified: Secondary | ICD-10-CM

## 2016-01-26 DIAGNOSIS — K219 Gastro-esophageal reflux disease without esophagitis: Secondary | ICD-10-CM

## 2016-01-26 DIAGNOSIS — E782 Mixed hyperlipidemia: Secondary | ICD-10-CM

## 2016-01-26 LAB — URINALYSIS, COMPLETE
BILIRUBIN UA: NEGATIVE
GLUCOSE, UA: NEGATIVE
NITRITE UA: POSITIVE — AB
SPEC GRAV UA: 1.02 (ref 1.005–1.030)
Urobilinogen, Ur: 4 mg/dL — ABNORMAL HIGH (ref 0.2–1.0)
pH, UA: 6 (ref 5.0–7.5)

## 2016-01-26 LAB — MICROSCOPIC EXAMINATION
Epithelial Cells (non renal): NONE SEEN /hpf (ref 0–10)
WBC, UA: >30 /hpf — AB (ref 0–?)

## 2016-01-26 LAB — BAYER DCA HB A1C WAIVED: HB A1C: 5.6 % (ref <7.0)

## 2016-01-27 LAB — NMR, LIPOPROFILE
Cholesterol: 127 mg/dL (ref 100–199)
HDL CHOLESTEROL BY NMR: 24 mg/dL — AB (ref >39)
HDL Particle Number: 28 umol/L — ABNORMAL LOW (ref >=30.5)
LDL PARTICLE NUMBER: 1126 nmol/L — AB (ref <1000)
LDL Size: 19.6 nm (ref >20.5)
LDL-C: 65 mg/dL (ref 0–99)
LP-IR Score: 65 — ABNORMAL HIGH (ref <=45)
SMALL LDL PARTICLE NUMBER: 986 nmol/L — AB (ref <=527)
TRIGLYCERIDES BY NMR: 192 mg/dL — AB (ref 0–149)

## 2016-01-27 LAB — CBC WITH DIFFERENTIAL/PLATELET
BASOS ABS: 0.1 10*3/uL (ref 0.0–0.2)
BASOS: 1 %
EOS (ABSOLUTE): 0.7 10*3/uL — AB (ref 0.0–0.4)
Eos: 5 %
Hematocrit: 39.1 % (ref 37.5–51.0)
Hemoglobin: 12.9 g/dL (ref 12.6–17.7)
IMMATURE GRANS (ABS): 0 10*3/uL (ref 0.0–0.1)
Immature Granulocytes: 0 %
LYMPHS: 38 %
Lymphocytes Absolute: 4.7 10*3/uL — ABNORMAL HIGH (ref 0.7–3.1)
MCH: 30.4 pg (ref 26.6–33.0)
MCHC: 33 g/dL (ref 31.5–35.7)
MCV: 92 fL (ref 79–97)
MONOS ABS: 1.1 10*3/uL — AB (ref 0.1–0.9)
Monocytes: 9 %
NEUTROS ABS: 5.9 10*3/uL (ref 1.4–7.0)
NEUTROS PCT: 47 %
PLATELETS: 373 10*3/uL (ref 150–379)
RBC: 4.25 x10E6/uL (ref 4.14–5.80)
RDW: 13.7 % (ref 12.3–15.4)
WBC: 12.4 10*3/uL — ABNORMAL HIGH (ref 3.4–10.8)

## 2016-01-27 LAB — BMP8+EGFR
BUN/Creatinine Ratio: 23 (ref 10–24)
BUN: 24 mg/dL (ref 8–27)
CALCIUM: 8.8 mg/dL (ref 8.6–10.2)
CHLORIDE: 103 mmol/L (ref 96–106)
CO2: 25 mmol/L (ref 18–29)
Creatinine, Ser: 1.04 mg/dL (ref 0.76–1.27)
GFR calc non Af Amer: 71 mL/min/{1.73_m2} (ref >59)
GFR, EST AFRICAN AMERICAN: 82 mL/min/{1.73_m2} (ref >59)
Glucose: 69 mg/dL (ref 65–99)
POTASSIUM: 4.7 mmol/L (ref 3.5–5.2)
SODIUM: 143 mmol/L (ref 134–144)

## 2016-01-27 LAB — HEPATIC FUNCTION PANEL
ALT: 18 IU/L (ref 0–44)
AST: 20 IU/L (ref 0–40)
Albumin: 3.8 g/dL (ref 3.5–4.8)
Alkaline Phosphatase: 33 IU/L — ABNORMAL LOW (ref 39–117)
BILIRUBIN, DIRECT: 0.17 mg/dL (ref 0.00–0.40)
Bilirubin Total: 0.5 mg/dL (ref 0.0–1.2)
TOTAL PROTEIN: 6.3 g/dL (ref 6.0–8.5)

## 2016-01-27 LAB — VITAMIN D 25 HYDROXY (VIT D DEFICIENCY, FRACTURES): VIT D 25 HYDROXY: 38.3 ng/mL (ref 30.0–100.0)

## 2016-01-28 DIAGNOSIS — M79604 Pain in right leg: Secondary | ICD-10-CM | POA: Diagnosis not present

## 2016-01-28 DIAGNOSIS — M79662 Pain in left lower leg: Secondary | ICD-10-CM | POA: Diagnosis not present

## 2016-01-28 DIAGNOSIS — M79605 Pain in left leg: Secondary | ICD-10-CM | POA: Diagnosis not present

## 2016-01-28 DIAGNOSIS — R51 Headache: Secondary | ICD-10-CM | POA: Diagnosis not present

## 2016-01-28 DIAGNOSIS — M5416 Radiculopathy, lumbar region: Secondary | ICD-10-CM | POA: Diagnosis not present

## 2016-01-28 DIAGNOSIS — Z79891 Long term (current) use of opiate analgesic: Secondary | ICD-10-CM | POA: Diagnosis not present

## 2016-01-28 DIAGNOSIS — M79661 Pain in right lower leg: Secondary | ICD-10-CM | POA: Diagnosis not present

## 2016-01-29 ENCOUNTER — Telehealth: Payer: Self-pay | Admitting: Family Medicine

## 2016-01-30 NOTE — Telephone Encounter (Signed)
Pt told the pharmacy tech he was only taking half a tablet. I couldn't see a change in your notes. Can you please clarify the dose on his Amaryl.

## 2016-02-01 NOTE — Telephone Encounter (Signed)
Should continue as doing one half tablet daily. Please let pharmacy know this

## 2016-02-03 NOTE — Telephone Encounter (Signed)
This has been taken care of in another encounter.

## 2016-02-03 NOTE — Telephone Encounter (Signed)
Pt is aware and will continue taking only half tablet.

## 2016-02-05 ENCOUNTER — Other Ambulatory Visit: Payer: Self-pay | Admitting: Family Medicine

## 2016-02-18 NOTE — Telephone Encounter (Signed)
Error

## 2016-02-26 DIAGNOSIS — M5416 Radiculopathy, lumbar region: Secondary | ICD-10-CM | POA: Diagnosis not present

## 2016-02-26 DIAGNOSIS — M79662 Pain in left lower leg: Secondary | ICD-10-CM | POA: Diagnosis not present

## 2016-02-26 DIAGNOSIS — Z79891 Long term (current) use of opiate analgesic: Secondary | ICD-10-CM | POA: Diagnosis not present

## 2016-02-26 DIAGNOSIS — M79605 Pain in left leg: Secondary | ICD-10-CM | POA: Diagnosis not present

## 2016-02-26 DIAGNOSIS — G894 Chronic pain syndrome: Secondary | ICD-10-CM | POA: Diagnosis not present

## 2016-02-26 DIAGNOSIS — M79604 Pain in right leg: Secondary | ICD-10-CM | POA: Diagnosis not present

## 2016-03-08 ENCOUNTER — Other Ambulatory Visit: Payer: Self-pay | Admitting: Family Medicine

## 2016-04-01 DIAGNOSIS — G894 Chronic pain syndrome: Secondary | ICD-10-CM | POA: Diagnosis not present

## 2016-04-01 DIAGNOSIS — M79604 Pain in right leg: Secondary | ICD-10-CM | POA: Diagnosis not present

## 2016-04-01 DIAGNOSIS — M5416 Radiculopathy, lumbar region: Secondary | ICD-10-CM | POA: Diagnosis not present

## 2016-04-01 DIAGNOSIS — Z79891 Long term (current) use of opiate analgesic: Secondary | ICD-10-CM | POA: Diagnosis not present

## 2016-04-01 DIAGNOSIS — M79662 Pain in left lower leg: Secondary | ICD-10-CM | POA: Diagnosis not present

## 2016-04-01 DIAGNOSIS — M79605 Pain in left leg: Secondary | ICD-10-CM | POA: Diagnosis not present

## 2016-04-05 ENCOUNTER — Other Ambulatory Visit: Payer: Self-pay | Admitting: Family Medicine

## 2016-04-07 DIAGNOSIS — D1803 Hemangioma of intra-abdominal structures: Secondary | ICD-10-CM | POA: Diagnosis not present

## 2016-04-07 DIAGNOSIS — Z79899 Other long term (current) drug therapy: Secondary | ICD-10-CM | POA: Diagnosis not present

## 2016-04-07 DIAGNOSIS — Z880 Allergy status to penicillin: Secondary | ICD-10-CM | POA: Diagnosis not present

## 2016-04-07 DIAGNOSIS — Z79891 Long term (current) use of opiate analgesic: Secondary | ICD-10-CM | POA: Diagnosis not present

## 2016-04-07 DIAGNOSIS — J9811 Atelectasis: Secondary | ICD-10-CM | POA: Diagnosis not present

## 2016-04-07 DIAGNOSIS — R531 Weakness: Secondary | ICD-10-CM | POA: Diagnosis not present

## 2016-04-07 DIAGNOSIS — E118 Type 2 diabetes mellitus with unspecified complications: Secondary | ICD-10-CM | POA: Diagnosis not present

## 2016-04-07 DIAGNOSIS — N390 Urinary tract infection, site not specified: Secondary | ICD-10-CM | POA: Diagnosis not present

## 2016-04-07 DIAGNOSIS — Z7982 Long term (current) use of aspirin: Secondary | ICD-10-CM | POA: Diagnosis not present

## 2016-04-07 DIAGNOSIS — R1110 Vomiting, unspecified: Secondary | ICD-10-CM | POA: Diagnosis not present

## 2016-04-07 DIAGNOSIS — Z888 Allergy status to other drugs, medicaments and biological substances status: Secondary | ICD-10-CM | POA: Diagnosis not present

## 2016-04-07 DIAGNOSIS — K3189 Other diseases of stomach and duodenum: Secondary | ICD-10-CM | POA: Diagnosis not present

## 2016-04-07 DIAGNOSIS — I10 Essential (primary) hypertension: Secondary | ICD-10-CM | POA: Diagnosis not present

## 2016-04-14 ENCOUNTER — Ambulatory Visit (INDEPENDENT_AMBULATORY_CARE_PROVIDER_SITE_OTHER): Payer: Medicare Other | Admitting: Family Medicine

## 2016-04-14 ENCOUNTER — Encounter: Payer: Self-pay | Admitting: Family Medicine

## 2016-04-14 VITALS — BP 128/68 | HR 66 | Temp 97.8°F | Ht 71.0 in | Wt 198.0 lb

## 2016-04-14 DIAGNOSIS — Z09 Encounter for follow-up examination after completed treatment for conditions other than malignant neoplasm: Secondary | ICD-10-CM

## 2016-04-14 DIAGNOSIS — Z23 Encounter for immunization: Secondary | ICD-10-CM | POA: Diagnosis not present

## 2016-04-14 DIAGNOSIS — N390 Urinary tract infection, site not specified: Secondary | ICD-10-CM

## 2016-04-14 DIAGNOSIS — E1140 Type 2 diabetes mellitus with diabetic neuropathy, unspecified: Secondary | ICD-10-CM

## 2016-04-14 DIAGNOSIS — Z87440 Personal history of urinary (tract) infections: Secondary | ICD-10-CM

## 2016-04-14 DIAGNOSIS — R300 Dysuria: Secondary | ICD-10-CM | POA: Diagnosis not present

## 2016-04-14 LAB — URINALYSIS, COMPLETE
Bilirubin, UA: NEGATIVE
Glucose, UA: NEGATIVE
Ketones, UA: NEGATIVE
Leukocytes, UA: NEGATIVE
Nitrite, UA: NEGATIVE
RBC, UA: NEGATIVE
Specific Gravity, UA: 1.02 (ref 1.005–1.030)
Urobilinogen, Ur: 0.2 mg/dL (ref 0.2–1.0)
pH, UA: 6.5 (ref 5.0–7.5)

## 2016-04-14 LAB — MICROSCOPIC EXAMINATION: Bacteria, UA: NONE SEEN

## 2016-04-14 NOTE — Patient Instructions (Signed)
We will call you with the results of the CBC BMP urinalysis and urine culture and sensitivity as soon as those results become available We will consider discussing your case with someone in infectious disease and see if they have any other thoughts about getting this from happening in the future. And this is especially because the bulb that you have growing is resistant to that antibiotics that is tested for.

## 2016-04-14 NOTE — Progress Notes (Signed)
Subjective:    Patient ID: Dustin Dominguez, male    DOB: 04-07-1941, 75 y.o.   MRN: [id number]  HPI Patient here today for hospital follow up from [facility] He was seen there for UTI.He was placed on Cipro and he finishes the last dose tonight. We are requesting the records to be sent to [place]. Just for information sake the patient is due to get an eye exam. He still has urinary frequency. We did receive the records and these were reviewed for seeing the patient today. The patient presented with nausea and vomiting and fever and was felt to be getting dehydrated and also had a urinary tract infection. He was given Rocephin intravenously and placed on Cipro which she will finish the night. 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 If the culture and sensitivity from today come back positive for Escherichia coli we'll consider getting him to be seen by some infectious disease person up a ladder at a [facility]. Today the patient denies any chest pain shortness of breath trouble swallowing digestive issues like nausea and vomiting or any problems with passing his water. He does complain chronically of some right lower quadrant abdominal discomfort.    Patient Active Problem List   Diagnosis Date Noted  . Coronary artery disease involving native coronary artery of native heart with angina pectoris ([place])   . Leukocytosis 07/06/2015  . GERD (gastroesophageal reflux disease) 06/13/2013  . Vitamin D deficiency 06/13/2013  . Degenerative disc disease, cervical 06/13/2013  . Degenerative disc disease, lumbar 06/13/2013  . Multiple thyroid nodules 04/07/2012  . Mixed hyperlipidemia 01/27/2011  . Coronary atherosclerosis of native coronary artery   . Sleep apnea   . Tobacco use disorder   . Essential hypertension, benign   . 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 amLODipine (NORVASC) 10 MG tablet TAKE 1 TABLET BY MOUTH DAILY  . aspirin 81 MG EC tablet Take 162 mg by mouth daily.   Marland Kitchen atorvastatin (LIPITOR) 20 MG tablet TAKE 1 TABLET BY MOUTH EVERY DAY  . ciprofloxacin (CIPRO) 500 MG tablet Take 500 mg by mouth 2 (two) times daily.  . diazepam (VALIUM) 10 MG tablet TAKE 1 TABLET BY MOUTH TWICE DAILY AS NEEDED  . fenofibrate 160 MG tablet TAKE 1 TABLET BY MOUTH DAILY  . fish oil-omega-3 fatty acids 1000 MG capsule Take 1 g by mouth daily.   Marland Kitchen glimepiride (AMARYL) 2 MG tablet TAKE 1 TABLET BY MOUTH EVERY DAY  . hydrochlorothiazide (MICROZIDE) 12.5 MG capsule TAKE 1 CAPSULE BY MOUTH EVERY DAY  . isosorbide mononitrate (IMDUR) 30 MG 24 hr tablet TAKE 1 TABLET BY MOUTH DAILY  . metoprolol (LOPRESSOR) 50 MG tablet TAKE 1/2 TABLET BY MOUTH TWICE DAILY  . morphine (MS CONTIN) 30 MG 12 hr tablet Take 30 mg by mouth 3 (three) times daily.  Marland Kitchen oxyCODONE-acetaminophen (PERCOCET) 10-325 MG per tablet Take 1 tablet by mouth 6 (six) times daily.   . pantoprazole (PROTONIX) 40 MG tablet TAKE 1 TABLET BY MOUTH TWICE DAILY  . quinapril (ACCUPRIL) 20 MG tablet TAKE 1 TABLET BY MOUTH EVERY DAY  . senna-docusate (SENOKOT S) 8.6-50 MG tablet Take 1 tablet by mouth as needed for mild constipation.  . Vitamin D, Cholecalciferol, 1000 UNITS CAPS Take 1 capsule by mouth daily. (Patient taking differently: Take 1,000 Units  by mouth daily. )  . Vitamin D, Ergocalciferol, (DRISDOL) 50000 units CAPS capsule TAKE 1 CAPSULE BY MOUTH ONCE A WEEK  . [DISCONTINUED] doxycycline (VIBRA-TABS) 100 MG tablet Take 1 tablet (100 mg total) by mouth 2 (two) times daily. 1 po bid   No facility-administered encounter medications on file as of 04/14/2016.       Review of Systems  Constitutional: Negative.   HENT: Negative.   Eyes: Negative.   Respiratory: Negative.   Cardiovascular: Negative.   Gastrointestinal: Negative.   Endocrine: Negative.    Genitourinary: Positive for frequency.  Musculoskeletal: Negative.   Skin: Negative.   Allergic/Immunologic: Negative.   Neurological: Negative.   Hematological: Negative.   Psychiatric/Behavioral: Negative.        Objective:   Physical Exam  Constitutional: He is oriented to person, place, and time. He appears well-developed and well-nourished. No distress.  HENT:  Head: Normocephalic and atraumatic.  Eyes: Conjunctivae and EOM are normal. Pupils are equal, round, and reactive to light. Right eye exhibits no discharge. Left eye exhibits no discharge. No scleral icterus.  Neck: Normal range of motion. Neck supple. No thyromegaly present.  Cardiovascular: Normal rate, regular rhythm and normal heart sounds.   No murmur heard. Heart is regular at 72/m  Pulmonary/Chest: Effort normal and breath sounds normal. No respiratory distress. He has no wheezes. He has no rales.  Clear anteriorly and posteriorly  Abdominal: Soft. Bowel sounds are normal. He exhibits no mass. There is tenderness. There is no rebound and no guarding.  Slight tenderness in suprapubic area and right lower quadrant  Musculoskeletal: Normal range of motion. He exhibits no edema.  Lymphadenopathy:    He has no cervical adenopathy.  Neurological: He is alert and oriented to person, place, and time.  Skin: Skin is warm and dry. No rash noted.  Psychiatric: He has a normal mood and affect. His behavior is normal. Judgment and thought content normal.  Nursing note and vitals reviewed.  BP 128/68 (BP Location: Right Arm)   Pulse 66   Temp 97.8 F (36.6 C) (Oral)   Ht '5\' 11"'$  (1.803 m)   Wt 198 lb (89.8 kg)   BMI 27.62 kg/m         Assessment & Plan:  1. Hospital discharge follow-up - Urinalysis, Complete - Urine culture - BMP8+EGFR - CBC with Differential/Platelet  2. Recent urinary tract infection -The patient is better from this. He'll finish his Cipro tonight. - Urinalysis, Complete - Urine  culture - CBC with Differential/Platelet  3. Type 2 diabetes mellitus with diabetic neuropathy, without long-term current use of insulin ([facility]) -No problems with his diabetes during this time.  4. Dysuria -He continues to have some slight dysuria and right lower quadrant abdominal pain but he has had this for a good while. -He is seen multiple urologists but no antibiotics have helped him to recover from this completely.  5. Urinary tract infection without hematuria, site unspecified -Check lab work including urinalysis and culture and sensitivity  Patient Instructions  We will call you with the results of the CBC BMP urinalysis and urine culture and sensitivity as soon as those results become available We will consider discussing your case with someone in infectious disease and see if they have any other thoughts about getting this from happening in the future. And this is especially because the bulb that you have growing is resistant to that antibiotics that is tested for.  Arrie Senate MD

## 2016-04-15 ENCOUNTER — Other Ambulatory Visit: Payer: Self-pay | Admitting: *Deleted

## 2016-04-15 LAB — URINE CULTURE: Organism ID, Bacteria: NO GROWTH

## 2016-04-15 LAB — BMP8+EGFR
BUN / CREAT RATIO: 16 (ref 10–24)
BUN: 15 mg/dL (ref 8–27)
CO2: 28 mmol/L (ref 18–29)
Calcium: 8.9 mg/dL (ref 8.6–10.2)
Chloride: 103 mmol/L (ref 96–106)
Creatinine, Ser: 0.94 mg/dL (ref 0.76–1.27)
GFR, EST AFRICAN AMERICAN: 93 mL/min/{1.73_m2} (ref >59)
GFR, EST NON AFRICAN AMERICAN: 80 mL/min/{1.73_m2} (ref >59)
Glucose: 132 mg/dL — ABNORMAL HIGH (ref 65–99)
POTASSIUM: 4.6 mmol/L (ref 3.5–5.2)
SODIUM: 143 mmol/L (ref 134–144)

## 2016-04-15 LAB — CBC WITH DIFFERENTIAL/PLATELET
BASOS: 1 %
Basophils Absolute: 0.1 10*3/uL (ref 0.0–0.2)
EOS (ABSOLUTE): 0.7 10*3/uL — AB (ref 0.0–0.4)
Eos: 5 %
Hematocrit: 38 % (ref 37.5–51.0)
Hemoglobin: 12.5 g/dL — ABNORMAL LOW (ref 12.6–17.7)
IMMATURE GRANS (ABS): 0.1 10*3/uL (ref 0.0–0.1)
Immature Granulocytes: 1 %
Lymphocytes Absolute: 6.2 10*3/uL — ABNORMAL HIGH (ref 0.7–3.1)
Lymphs: 39 %
MCH: 30.9 pg (ref 26.6–33.0)
MCHC: 32.9 g/dL (ref 31.5–35.7)
MCV: 94 fL (ref 79–97)
MONOS ABS: 1.3 10*3/uL — AB (ref 0.1–0.9)
Monocytes: 8 %
NEUTROS ABS: 7.7 10*3/uL — AB (ref 1.4–7.0)
Neutrophils: 46 %
PLATELETS: 530 10*3/uL — AB (ref 150–379)
RBC: 4.05 x10E6/uL — ABNORMAL LOW (ref 4.14–5.80)
RDW: 13.7 % (ref 12.3–15.4)
WBC: 16.1 10*3/uL — ABNORMAL HIGH (ref 3.4–10.8)

## 2016-04-15 MED ORDER — DIAZEPAM 10 MG PO TABS: 10.0000 mg | ORAL_TABLET | Freq: Two times a day (BID) | ORAL | 2 refills | Status: DC | PRN

## 2016-04-15 NOTE — Telephone Encounter (Signed)
Last filled 03/14/2016.

## 2016-04-19 DIAGNOSIS — L570 Actinic keratosis: Secondary | ICD-10-CM | POA: Diagnosis not present

## 2016-04-20 ENCOUNTER — Other Ambulatory Visit: Payer: Self-pay | Admitting: *Deleted

## 2016-04-27 ENCOUNTER — Telehealth: Payer: Self-pay | Admitting: Family Medicine

## 2016-04-27 MED ORDER — DIAZEPAM 10 MG PO TABS: 10.0000 mg | ORAL_TABLET | Freq: Two times a day (BID) | ORAL | 1 refills | Status: DC | PRN

## 2016-04-27 NOTE — Telephone Encounter (Signed)
Ordered and phoned to pharm and pt aware

## 2016-04-27 NOTE — Telephone Encounter (Signed)
It is okay to refill this patient's Valium or diazepam

## 2016-05-04 DIAGNOSIS — M545 Low back pain: Secondary | ICD-10-CM | POA: Diagnosis not present

## 2016-05-04 DIAGNOSIS — Z79891 Long term (current) use of opiate analgesic: Secondary | ICD-10-CM | POA: Diagnosis not present

## 2016-05-04 DIAGNOSIS — M79662 Pain in left lower leg: Secondary | ICD-10-CM | POA: Diagnosis not present

## 2016-05-04 DIAGNOSIS — G890 Central pain syndrome: Secondary | ICD-10-CM | POA: Diagnosis not present

## 2016-05-04 DIAGNOSIS — M79604 Pain in right leg: Secondary | ICD-10-CM | POA: Diagnosis not present

## 2016-05-04 DIAGNOSIS — M79605 Pain in left leg: Secondary | ICD-10-CM | POA: Diagnosis not present

## 2016-05-04 DIAGNOSIS — G894 Chronic pain syndrome: Secondary | ICD-10-CM | POA: Diagnosis not present

## 2016-05-04 DIAGNOSIS — M79661 Pain in right lower leg: Secondary | ICD-10-CM | POA: Diagnosis not present

## 2016-05-05 ENCOUNTER — Other Ambulatory Visit: Payer: Self-pay | Admitting: Family Medicine

## 2016-05-21 DIAGNOSIS — M546 Pain in thoracic spine: Secondary | ICD-10-CM | POA: Diagnosis not present

## 2016-05-21 DIAGNOSIS — M545 Low back pain: Secondary | ICD-10-CM | POA: Diagnosis not present

## 2016-05-21 DIAGNOSIS — Z79891 Long term (current) use of opiate analgesic: Secondary | ICD-10-CM | POA: Diagnosis not present

## 2016-05-21 DIAGNOSIS — G894 Chronic pain syndrome: Secondary | ICD-10-CM | POA: Diagnosis not present

## 2016-05-25 ENCOUNTER — Ambulatory Visit: Payer: Medicare Other | Admitting: Family Medicine

## 2016-06-16 ENCOUNTER — Other Ambulatory Visit: Payer: Self-pay | Admitting: *Deleted

## 2016-06-16 ENCOUNTER — Other Ambulatory Visit: Payer: Self-pay | Admitting: Family Medicine

## 2016-06-16 ENCOUNTER — Ambulatory Visit (INDEPENDENT_AMBULATORY_CARE_PROVIDER_SITE_OTHER): Payer: Medicare Other | Admitting: Family Medicine

## 2016-06-16 ENCOUNTER — Telehealth: Payer: Self-pay | Admitting: Family Medicine

## 2016-06-16 ENCOUNTER — Encounter: Payer: Self-pay | Admitting: Family Medicine

## 2016-06-16 ENCOUNTER — Ambulatory Visit (HOSPITAL_COMMUNITY)
Admission: RE | Admit: 2016-06-16 | Discharge: 2016-06-16 | Disposition: A | Discharge status: Home / Self Care | Payer: Medicare Other | Source: Ambulatory Visit | Attending: Family Medicine | Admitting: Family Medicine

## 2016-06-16 VITALS — BP 100/56 | HR 78 | Temp 97.8°F | Ht 71.0 in | Wt 188.0 lb

## 2016-06-16 DIAGNOSIS — R350 Frequency of micturition: Secondary | ICD-10-CM | POA: Diagnosis not present

## 2016-06-16 DIAGNOSIS — D72829 Elevated white blood cell count, unspecified: Secondary | ICD-10-CM

## 2016-06-16 DIAGNOSIS — N492 Inflammatory disorders of scrotum: Secondary | ICD-10-CM

## 2016-06-16 DIAGNOSIS — E1140 Type 2 diabetes mellitus with diabetic neuropathy, unspecified: Secondary | ICD-10-CM | POA: Diagnosis not present

## 2016-06-16 DIAGNOSIS — K469 Unspecified abdominal hernia without obstruction or gangrene: Secondary | ICD-10-CM

## 2016-06-16 DIAGNOSIS — E875 Hyperkalemia: Secondary | ICD-10-CM

## 2016-06-16 DIAGNOSIS — N5089 Other specified disorders of the male genital organs: Secondary | ICD-10-CM

## 2016-06-16 DIAGNOSIS — I2510 Atherosclerotic heart disease of native coronary artery without angina pectoris: Secondary | ICD-10-CM | POA: Diagnosis not present

## 2016-06-16 DIAGNOSIS — E782 Mixed hyperlipidemia: Secondary | ICD-10-CM

## 2016-06-16 DIAGNOSIS — C439 Malignant melanoma of skin, unspecified: Secondary | ICD-10-CM

## 2016-06-16 DIAGNOSIS — K4090 Unilateral inguinal hernia, without obstruction or gangrene, not specified as recurrent: Secondary | ICD-10-CM

## 2016-06-16 DIAGNOSIS — E559 Vitamin D deficiency, unspecified: Secondary | ICD-10-CM

## 2016-06-16 DIAGNOSIS — R188 Other ascites: Secondary | ICD-10-CM

## 2016-06-16 DIAGNOSIS — R300 Dysuria: Secondary | ICD-10-CM | POA: Diagnosis not present

## 2016-06-16 DIAGNOSIS — I739 Peripheral vascular disease, unspecified: Secondary | ICD-10-CM | POA: Diagnosis not present

## 2016-06-16 DIAGNOSIS — R9389 Abnormal findings on diagnostic imaging of other specified body structures: Secondary | ICD-10-CM

## 2016-06-16 DIAGNOSIS — I10 Essential (primary) hypertension: Secondary | ICD-10-CM | POA: Diagnosis not present

## 2016-06-16 DIAGNOSIS — N433 Hydrocele, unspecified: Secondary | ICD-10-CM | POA: Diagnosis not present

## 2016-06-16 DIAGNOSIS — K219 Gastro-esophageal reflux disease without esophagitis: Secondary | ICD-10-CM

## 2016-06-16 LAB — URINALYSIS, COMPLETE
Bilirubin, UA: NEGATIVE
Glucose, UA: NEGATIVE
KETONES UA: NEGATIVE
Nitrite, UA: POSITIVE — AB
SPEC GRAV UA: 1.01 (ref 1.005–1.030)
Urobilinogen, Ur: 0.2 mg/dL (ref 0.2–1.0)
pH, UA: 5 (ref 5.0–7.5)

## 2016-06-16 LAB — MICROSCOPIC EXAMINATION
RENAL EPITHEL UA: NONE SEEN /HPF
WBC, UA: >30 /hpf — AB (ref 0–?)

## 2016-06-16 LAB — BAYER DCA HB A1C WAIVED: HB A1C (BAYER DCA - WAIVED): 5.2 % (ref <7.0)

## 2016-06-16 MED ORDER — CIPROFLOXACIN HCL 500 MG PO TABS: 500.0000 mg | ORAL_TABLET | Freq: Two times a day (BID) | ORAL | 0 refills | Status: DC

## 2016-06-16 MED ORDER — DIAZEPAM 10 MG PO TABS: 10.0000 mg | ORAL_TABLET | Freq: Two times a day (BID) | ORAL | 4 refills | Status: DC | PRN

## 2016-06-16 MED ORDER — SUCRALFATE 1 G PO TABS: 1.0000 g | ORAL_TABLET | Freq: Three times a day (TID) | ORAL | 0 refills | Status: DC

## 2016-06-16 NOTE — Patient Instructions (Signed)
Medicare Annual Wellness Visit  [facility] and the medical providers at [facility] strive to bring you the best medical care.  In doing so we not only want to address your current medical conditions and concerns but also to detect new conditions early and prevent illness, disease and health-related problems.    Medicare offers a yearly Wellness Visit which allows our clinical staff to assess your need for preventative services including immunizations, lifestyle education, counseling to decrease risk of preventable diseases and screening for fall risk and other medical concerns.    This visit is provided free of charge (no copay) for all Medicare recipients. The clinical pharmacists at [facility] have begun to conduct these Wellness Visits which will also include a thorough review of all your medications.    As you primary medical provider recommend that you make an appointment for your Annual Wellness Visit if you have not done so already this year.  You may set up this appointment before you leave today or you may call back ([phone]) and schedule an appointment.  Please make sure when you call that you mention that you are scheduling your Annual Wellness Visit with the clinical pharmacist so that the appointment may be made for the proper length of time.     Continue current medications. Continue good therapeutic lifestyle changes which include good diet and exercise. Fall precautions discussed with patient. If an FOBT was given today- please return it to our front desk. If you are over 50 years old - you may need Prevnar 13 or the adult Pneumonia vaccine.  **Flu shots are available--- please call and schedule a FLU-CLINIC appointment**  After your visit with us today you will receive a survey in the mail or online from [employer] regarding your care with us. Please take a moment to fill this out. Your feedback is very  important to us as you can help us better understand your patient needs as well as improve your experience and satisfaction. WE CARE ABOUT YOU!!!    

## 2016-06-16 NOTE — Telephone Encounter (Signed)
error 

## 2016-06-16 NOTE — Addendum Note (Signed)
Addended by: Zannie Cove on: 06/16/2016 12:17 PM   Modules accepted: Orders

## 2016-06-16 NOTE — Progress Notes (Signed)
Subjective:    Patient ID: Dustin Dominguez, male    DOB: Jul 28, 1940, 75 y.o.   MRN: [id number]  HPI Pt here for follow up and management of chronic medical problems which includes diabetes, hyperlipidemia and hypertension. He is taking medications regularly.This patient has chronic neck and back pain. He is followed regularly by neurosurgery and they manage his pain medications. He also has a history of coronary artery disease GERD and hyperlipidemia along with sleep apnea. The patient also has chronic urinary tract infections. He is been seen by multiple specialists and unfortunately has bacteria growing is resistant to everything that can be given to this patient. He is requesting a refill on his diazepam. He does complain also of some shortness of breath. The patient says he's having no more chest pain than usual. He does have some shortness of breath and this seems to be slightly increased. He did have a CT scan back in the spring and he says that this was okay. 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 It also suggested pulmonary arterial hypertension and reveals some right-sided pleural thickening. The radiologist recommended that this screening CT be done once yearly and we will remind the patient of that. He will be given a copy of this report. The patient is having no trouble with swallowing but does have increased indigestion and heartburn. He is taking pantoprazole twice daily. He does not have any blood in the stool black tarry bowel movements. He is passing his water well and does have burning with voiding. He is supposed to have a follow-up exam with the urologist but somehow never got the schedule and we will arrange for this to happen in [place]     Patient Active Problem List   Diagnosis Date Noted  . Coronary artery disease involving native coronary artery of native heart with angina pectoris ([place])   . Leukocytosis 07/06/2015  . GERD  (gastroesophageal reflux disease) 06/13/2013  . Vitamin D deficiency 06/13/2013  . Degenerative disc disease, cervical 06/13/2013  . Degenerative disc disease, lumbar 06/13/2013  . Multiple thyroid nodules 04/07/2012  . Mixed hyperlipidemia 01/27/2011  . Coronary atherosclerosis of native coronary artery   . Sleep apnea   . Tobacco use disorder   . Essential hypertension, benign   . 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 amLODipine (NORVASC) 10 MG tablet TAKE 1 TABLET BY MOUTH DAILY  . aspirin 81 MG EC tablet Take 162 mg by mouth daily.   Marland Kitchen atorvastatin (LIPITOR) 20 MG tablet TAKE 1 TABLET BY MOUTH EVERY DAY  . diazepam (VALIUM) 10 MG tablet Take 1 tablet (10 mg total) by mouth 2 (two) times daily as needed.  . fenofibrate 160 MG tablet TAKE 1 TABLET BY MOUTH DAILY  . fish oil-omega-3 fatty acids 1000 MG capsule Take 1 g by mouth daily.   Marland Kitchen glimepiride (AMARYL) 2 MG tablet TAKE 1 TABLET BY MOUTH EVERY DAY  . hydrochlorothiazide (MICROZIDE) 12.5 MG capsule TAKE 1 CAPSULE BY MOUTH EVERY DAY  . isosorbide mononitrate (IMDUR) 30 MG 24 hr tablet TAKE 1 TABLET BY MOUTH DAILY  . metoprolol (LOPRESSOR) 50 MG tablet TAKE 1/2 TABLET BY MOUTH TWICE DAILY  . morphine (MS CONTIN) 30 MG 12 hr tablet Take 30 mg by mouth 3 (three) times daily.  Marland Kitchen oxyCODONE-acetaminophen (PERCOCET) 10-325 MG per tablet Take 1 tablet by mouth 6 (six)  times daily.   . pantoprazole (PROTONIX) 40 MG tablet TAKE 1 TABLET BY MOUTH TWICE DAILY  . quinapril (ACCUPRIL) 20 MG tablet TAKE 1 TABLET BY MOUTH EVERY DAY  . senna-docusate (SENOKOT S) 8.6-50 MG tablet Take 1 tablet by mouth as needed for mild constipation.  . Vitamin D, Cholecalciferol, 1000 UNITS CAPS Take 1 capsule by mouth daily. (Patient taking differently: Take 1,000 Units by mouth daily. )  . [DISCONTINUED] ciprofloxacin (CIPRO) 500 MG tablet Take 500  mg by mouth 2 (two) times daily.  . [DISCONTINUED] Vitamin D, Ergocalciferol, (DRISDOL) 50000 units CAPS capsule TAKE 1 CAPSULE BY MOUTH ONCE A WEEK   No facility-administered encounter medications on file as of 06/16/2016.      Review of Systems  Constitutional: Negative.   HENT: Negative.   Eyes: Negative.   Respiratory: Positive for shortness of breath.   Cardiovascular: Negative.   Gastrointestinal: Negative.   Endocrine: Negative.   Genitourinary: Positive for dysuria and frequency.  Musculoskeletal: Negative.   Skin: Negative.   Allergic/Immunologic: Negative.   Neurological: Negative.   Hematological: Negative.   Psychiatric/Behavioral: Negative.        Objective:   Physical Exam  Constitutional: He is oriented to person, place, and time. He appears well-developed and well-nourished. No distress.  HENT:  Head: Normocephalic and atraumatic.  Right Ear: External ear normal.  Left Ear: External ear normal.  Nose: Nose normal.  Mouth/Throat: Oropharynx is clear and moist. No oropharyngeal exudate.  Eyes: Conjunctivae and EOM are normal. Pupils are equal, round, and reactive to light. Right eye exhibits no discharge. Left eye exhibits no discharge. No scleral icterus.  Neck: Normal range of motion. Neck supple. No thyromegaly present.  Cardiovascular: Normal rate, regular rhythm and normal heart sounds.   No murmur heard. Decreased pedal pulses Heart has a regular rate and rhythm at 84/m  Pulmonary/Chest: Effort normal and breath sounds normal. No respiratory distress. He has no wheezes. He has no rales. He exhibits no tenderness.  Clear anteriorly and posteriorly and no axillary adenopathy  Abdominal: Soft. Bowel sounds are normal. He exhibits no mass. There is tenderness. There is no rebound and no guarding.  Slight right lower quadrant tenderness due to all of scar tissue  Genitourinary: Rectum normal and penis normal.  Genitourinary Comments: Minimal if any  enlargement of the prostate. There is an extreme amount of swelling of the left scrotal area. There is warmth and rubor in this area too. It is tender to palpation. The skin is very tight especially over the left scrotum. It is feverish as indicated.  Musculoskeletal: Normal range of motion. He exhibits no edema or tenderness.  The patient has a kyphotic posture. He has chronic neck and back pain. He sees the orthopedic surgeon for this.  Lymphadenopathy:    He has no cervical adenopathy.  Neurological: He is alert and oriented to person, place, and time. He has normal reflexes. No cranial nerve deficit.  Skin: Skin is warm and dry. No rash noted. There is erythema.  Psychiatric: He has a normal mood and affect. His behavior is normal. Judgment and thought content normal.  Nursing note and vitals reviewed.  BP (!) 100/56 (BP Location: Left Arm)   Pulse 78   Temp 97.8 F (36.6 C) (Oral)   Ht '5\' 11"'$  (1.803 m)   Wt 188 lb (85.3 kg)   BMI 26.22 kg/m         Assessment & Plan:  1. Type 2 diabetes mellitus with  diabetic neuropathy, without long-term current use of insulin ([place]) -The patient says his blood sugars have been under good control. We will check an A1c to make sure that everything is stable with this. - CBC with Differential/Platelet - Bayer DCA Hb A1c Waived  2. Mixed hyperlipidemia -Continue with current treatment pending results of lab work - CBC with Differential/Platelet - Lipid panel  3. Essential hypertension, benign -The blood pressure is good today and we will continue with current treatment - BMP8+EGFR - CBC with Differential/Platelet - Hepatic function panel  4. Vitamin D deficiency -Continue with current treatment pending results of lab work - CBC with Differential/Platelet - VITAMIN D 25 Hydroxy (Vit-D Deficiency, Fractures)  5. Gastroesophageal reflux disease, esophagitis presence not specified -Add Carafate 1 g 4 times daily before meals and at bedtime  and continue with proton X every morning - CBC with Differential/Platelet  6. ASCVD (arteriosclerotic cardiovascular disease) -Follow-up with cardiology as planned - CBC with Differential/Platelet  7. Dysuria -Check urinalysis and schedule appointment with urologist for follow-up as the patient did not follow through with this. - CBC with Differential/Platelet - Urinalysis, Complete - Urine culture - Ambulatory referral to Urology  8. Urine frequency -Follow-up with urology as planned - CBC with Differential/Platelet - Urinalysis, Complete - Urine culture - Ambulatory referral to Urology  9. Peripheral vascular insufficiency (HCC) -Stop smoking try to walk more regularly  10. Melanoma of skin ([place]) -Follow-up with dermatology as planned  11. Abdominal hernia without obstruction and without gangrene, recurrence not specified, unspecified hernia type -Get ultrasound of scrotum and an appointment with general surgery as soon as possible - Ambulatory referral to General Surgery  12. Scrotal infection -Take Cipro as directed  13. Left inguinal hernia -Get appointment with Gen. surgery for follow-up  Meds ordered this encounter  Medications  . diazepam (VALIUM) 10 MG tablet    Sig: Take 1 tablet (10 mg total) by mouth 2 (two) times daily as needed.    Dispense:  60 tablet    Refill:  4    Generic IOE:VOJJKK    '10MG'$    Cipro 500 twice daily for 10 days  Patient Instructions                       Medicare Annual Wellness Visit  [place] and the medical providers at [facility] strive to bring you the best medical care.  In doing so we not only want to address your current medical conditions and concerns but also to detect new conditions early and prevent illness, disease and health-related problems.    Medicare offers a yearly Wellness Visit which allows our clinical staff to assess your need for preventative services including immunizations,  lifestyle education, counseling to decrease risk of preventable diseases and screening for fall risk and other medical concerns.    This visit is provided free of charge (no copay) for all Medicare recipients. The clinical pharmacists at [facility] have begun to conduct these Wellness Visits which will also include a thorough review of all your medications.    As you primary medical provider recommend that you make an appointment for your Annual Wellness Visit if you have not done so already this year.  You may set up this appointment before you leave today or you may call back ([phone]) and schedule an appointment.  Please make sure when you call that you mention that you are scheduling your Annual Wellness Visit with the  clinical pharmacist so that the appointment may be made for the proper length of time.     Continue current medications. Continue good therapeutic lifestyle changes which include good diet and exercise. Fall precautions discussed with patient. If an FOBT was given today- please return it to our front desk. If you are over 37 years old - you may need Prevnar 25 or the adult Pneumonia vaccine.  **Flu shots are available--- please call and schedule a FLU-CLINIC appointment**  After your visit with [place] today you will receive a survey in the mail or online from [employer] regarding your care with [place]. Please take a moment to fill this out. Your feedback is very important to [place] as you can help [place] better understand your patient needs as well as improve your experience and satisfaction. WE CARE ABOUT YOU!!!     Arrie Senate MD

## 2016-06-17 DIAGNOSIS — Z79891 Long term (current) use of opiate analgesic: Secondary | ICD-10-CM | POA: Diagnosis not present

## 2016-06-17 DIAGNOSIS — M5416 Radiculopathy, lumbar region: Secondary | ICD-10-CM | POA: Diagnosis not present

## 2016-06-17 DIAGNOSIS — M79662 Pain in left lower leg: Secondary | ICD-10-CM | POA: Diagnosis not present

## 2016-06-17 DIAGNOSIS — M79605 Pain in left leg: Secondary | ICD-10-CM | POA: Diagnosis not present

## 2016-06-17 DIAGNOSIS — G894 Chronic pain syndrome: Secondary | ICD-10-CM | POA: Diagnosis not present

## 2016-06-17 DIAGNOSIS — M79604 Pain in right leg: Secondary | ICD-10-CM | POA: Diagnosis not present

## 2016-06-17 LAB — CBC WITH DIFFERENTIAL/PLATELET
BASOS: 0 %
Basophils Absolute: 0.1 10*3/uL (ref 0.0–0.2)
EOS (ABSOLUTE): 0.3 10*3/uL (ref 0.0–0.4)
EOS: 2 %
HEMATOCRIT: 38.7 % (ref 37.5–51.0)
HEMOGLOBIN: 12.9 g/dL (ref 12.6–17.7)
IMMATURE GRANS (ABS): 0 10*3/uL (ref 0.0–0.1)
IMMATURE GRANULOCYTES: 0 %
Lymphocytes Absolute: 3.9 10*3/uL — ABNORMAL HIGH (ref 0.7–3.1)
Lymphs: 21 %
MCH: 29.9 pg (ref 26.6–33.0)
MCHC: 33.3 g/dL (ref 31.5–35.7)
MCV: 90 fL (ref 79–97)
MONOCYTES: 8 %
MONOS ABS: 1.6 10*3/uL — AB (ref 0.1–0.9)
NEUTROS PCT: 69 %
Neutrophils Absolute: 13.2 10*3/uL — ABNORMAL HIGH (ref 1.4–7.0)
Platelets: 609 10*3/uL — ABNORMAL HIGH (ref 150–379)
RBC: 4.32 x10E6/uL (ref 4.14–5.80)
RDW: 13.3 % (ref 12.3–15.4)
WBC: 19 10*3/uL — ABNORMAL HIGH (ref 3.4–10.8)

## 2016-06-17 LAB — HEPATIC FUNCTION PANEL
ALK PHOS: 72 IU/L (ref 39–117)
ALT: 9 IU/L (ref 0–44)
AST: 14 IU/L (ref 0–40)
Albumin: 3.6 g/dL (ref 3.5–4.8)
BILIRUBIN TOTAL: 0.6 mg/dL (ref 0.0–1.2)
BILIRUBIN, DIRECT: 0.31 mg/dL (ref 0.00–0.40)
Total Protein: 6.9 g/dL (ref 6.0–8.5)

## 2016-06-17 LAB — BMP8+EGFR
BUN / CREAT RATIO: 17 (ref 10–24)
BUN: 15 mg/dL (ref 8–27)
CO2: 26 mmol/L (ref 18–29)
CREATININE: 0.86 mg/dL (ref 0.76–1.27)
Calcium: 8.6 mg/dL (ref 8.6–10.2)
Chloride: 96 mmol/L (ref 96–106)
GFR calc Af Amer: 99 mL/min/{1.73_m2} (ref >59)
GFR, EST NON AFRICAN AMERICAN: 86 mL/min/{1.73_m2} (ref >59)
Glucose: 91 mg/dL (ref 65–99)
Potassium: 3.4 mmol/L — ABNORMAL LOW (ref 3.5–5.2)
SODIUM: 140 mmol/L (ref 134–144)

## 2016-06-17 LAB — LIPID PANEL
CHOL/HDL RATIO: 4.9 ratio (ref 0.0–5.0)
Cholesterol, Total: 102 mg/dL (ref 100–199)
HDL: 21 mg/dL — ABNORMAL LOW (ref >39)
LDL CALC: 49 mg/dL (ref 0–99)
Triglycerides: 161 mg/dL — ABNORMAL HIGH (ref 0–149)
VLDL Cholesterol Cal: 32 mg/dL (ref 5–40)

## 2016-06-17 LAB — VITAMIN D 25 HYDROXY (VIT D DEFICIENCY, FRACTURES): Vit D, 25-Hydroxy: 56.1 ng/mL (ref 30.0–100.0)

## 2016-06-18 LAB — URINE CULTURE

## 2016-06-18 NOTE — Addendum Note (Signed)
Addended by: Thana Ates on: 06/18/2016 04:44 PM   Modules accepted: Orders

## 2016-06-22 ENCOUNTER — Ambulatory Visit (INDEPENDENT_AMBULATORY_CARE_PROVIDER_SITE_OTHER): Payer: Medicare Other | Admitting: Family Medicine

## 2016-06-22 ENCOUNTER — Telehealth: Payer: Self-pay | Admitting: Family Medicine

## 2016-06-22 ENCOUNTER — Encounter: Payer: Self-pay | Admitting: Family Medicine

## 2016-06-22 VITALS — BP 118/63 | HR 79 | Temp 98.7°F | Ht 71.0 in | Wt 188.0 lb

## 2016-06-22 DIAGNOSIS — N453 Epididymo-orchitis: Secondary | ICD-10-CM | POA: Diagnosis not present

## 2016-06-22 MED ORDER — LEVOFLOXACIN 500 MG PO TABS: 500.0000 mg | ORAL_TABLET | Freq: Every day | ORAL | 0 refills | Status: DC

## 2016-06-22 NOTE — Telephone Encounter (Signed)
Pt c/o worsening scrotal pain  appt scheduled

## 2016-06-22 NOTE — Patient Instructions (Signed)
Apply heat several times daily.
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
## 2016-06-30 ENCOUNTER — Ambulatory Visit (HOSPITAL_COMMUNITY): Payer: Medicare Other

## 2016-07-01 ENCOUNTER — Telehealth: Payer: Self-pay | Admitting: Family Medicine

## 2016-07-01 ENCOUNTER — Telehealth: Payer: Self-pay

## 2016-07-01 ENCOUNTER — Other Ambulatory Visit: Payer: Self-pay

## 2016-07-01 DIAGNOSIS — R300 Dysuria: Secondary | ICD-10-CM

## 2016-07-01 NOTE — Telephone Encounter (Signed)
Please do this referral for this patient as he is seen a [facility] in the past and has seen them in [place].

## 2016-07-02 ENCOUNTER — Emergency Department (HOSPITAL_COMMUNITY): Payer: Medicare Other

## 2016-07-02 ENCOUNTER — Encounter (HOSPITAL_COMMUNITY): Payer: Self-pay | Admitting: *Deleted

## 2016-07-02 ENCOUNTER — Inpatient Hospital Stay (HOSPITAL_COMMUNITY)
Admission: EM | Admit: 2016-07-02 | Discharge: 2016-07-15 | DRG: 727 | Disposition: A | Discharge status: Home / Self Care | Payer: Medicare Other | Attending: Family Medicine | Admitting: Family Medicine

## 2016-07-02 DIAGNOSIS — I25119 Atherosclerotic heart disease of native coronary artery with unspecified angina pectoris: Secondary | ICD-10-CM | POA: Diagnosis present

## 2016-07-02 DIAGNOSIS — E440 Moderate protein-calorie malnutrition: Secondary | ICD-10-CM | POA: Insufficient documentation

## 2016-07-02 DIAGNOSIS — Z23 Encounter for immunization: Secondary | ICD-10-CM

## 2016-07-02 DIAGNOSIS — E1151 Type 2 diabetes mellitus with diabetic peripheral angiopathy without gangrene: Secondary | ICD-10-CM | POA: Diagnosis present

## 2016-07-02 DIAGNOSIS — G4733 Obstructive sleep apnea (adult) (pediatric): Secondary | ICD-10-CM | POA: Diagnosis present

## 2016-07-02 DIAGNOSIS — R1084 Generalized abdominal pain: Secondary | ICD-10-CM | POA: Clinically undetermined

## 2016-07-02 DIAGNOSIS — R1110 Vomiting, unspecified: Secondary | ICD-10-CM

## 2016-07-02 DIAGNOSIS — K5909 Other constipation: Secondary | ICD-10-CM | POA: Diagnosis present

## 2016-07-02 DIAGNOSIS — B961 Klebsiella pneumoniae [K. pneumoniae] as the cause of diseases classified elsewhere: Secondary | ICD-10-CM | POA: Diagnosis present

## 2016-07-02 DIAGNOSIS — E559 Vitamin D deficiency, unspecified: Secondary | ICD-10-CM | POA: Diagnosis not present

## 2016-07-02 DIAGNOSIS — I70209 Unspecified atherosclerosis of native arteries of extremities, unspecified extremity: Secondary | ICD-10-CM | POA: Diagnosis not present

## 2016-07-02 DIAGNOSIS — N179 Acute kidney failure, unspecified: Secondary | ICD-10-CM | POA: Diagnosis present

## 2016-07-02 DIAGNOSIS — R109 Unspecified abdominal pain: Secondary | ICD-10-CM | POA: Clinically undetermined

## 2016-07-02 DIAGNOSIS — N390 Urinary tract infection, site not specified: Secondary | ICD-10-CM | POA: Diagnosis present

## 2016-07-02 DIAGNOSIS — M549 Dorsalgia, unspecified: Secondary | ICD-10-CM | POA: Diagnosis present

## 2016-07-02 DIAGNOSIS — N433 Hydrocele, unspecified: Secondary | ICD-10-CM | POA: Diagnosis present

## 2016-07-02 DIAGNOSIS — N400 Enlarged prostate without lower urinary tract symptoms: Secondary | ICD-10-CM | POA: Diagnosis present

## 2016-07-02 DIAGNOSIS — Z79891 Long term (current) use of opiate analgesic: Secondary | ICD-10-CM

## 2016-07-02 DIAGNOSIS — E782 Mixed hyperlipidemia: Secondary | ICD-10-CM | POA: Diagnosis present

## 2016-07-02 DIAGNOSIS — Z880 Allergy status to penicillin: Secondary | ICD-10-CM

## 2016-07-02 DIAGNOSIS — N492 Inflammatory disorders of scrotum: Secondary | ICD-10-CM | POA: Diagnosis not present

## 2016-07-02 DIAGNOSIS — Z794 Long term (current) use of insulin: Secondary | ICD-10-CM

## 2016-07-02 DIAGNOSIS — I10 Essential (primary) hypertension: Secondary | ICD-10-CM | POA: Diagnosis not present

## 2016-07-02 DIAGNOSIS — Z885 Allergy status to narcotic agent status: Secondary | ICD-10-CM

## 2016-07-02 DIAGNOSIS — K5651 Intestinal adhesions [bands], with partial obstruction: Secondary | ICD-10-CM | POA: Diagnosis present

## 2016-07-02 DIAGNOSIS — E43 Unspecified severe protein-calorie malnutrition: Secondary | ICD-10-CM | POA: Diagnosis not present

## 2016-07-02 DIAGNOSIS — Z86010 Personal history of colonic polyps: Secondary | ICD-10-CM

## 2016-07-02 DIAGNOSIS — K56609 Unspecified intestinal obstruction, unspecified as to partial versus complete obstruction: Secondary | ICD-10-CM | POA: Diagnosis not present

## 2016-07-02 DIAGNOSIS — G8929 Other chronic pain: Secondary | ICD-10-CM | POA: Diagnosis present

## 2016-07-02 DIAGNOSIS — Z0189 Encounter for other specified special examinations: Secondary | ICD-10-CM

## 2016-07-02 DIAGNOSIS — I252 Old myocardial infarction: Secondary | ICD-10-CM

## 2016-07-02 DIAGNOSIS — K449 Diaphragmatic hernia without obstruction or gangrene: Secondary | ICD-10-CM | POA: Diagnosis not present

## 2016-07-02 DIAGNOSIS — Z903 Acquired absence of stomach [part of]: Secondary | ICD-10-CM

## 2016-07-02 DIAGNOSIS — E876 Hypokalemia: Secondary | ICD-10-CM | POA: Diagnosis present

## 2016-07-02 DIAGNOSIS — Z951 Presence of aortocoronary bypass graft: Secondary | ICD-10-CM

## 2016-07-02 DIAGNOSIS — F17210 Nicotine dependence, cigarettes, uncomplicated: Secondary | ICD-10-CM | POA: Diagnosis present

## 2016-07-02 DIAGNOSIS — Z6824 Body mass index (BMI) 24.0-24.9, adult: Secondary | ICD-10-CM

## 2016-07-02 DIAGNOSIS — K219 Gastro-esophageal reflux disease without esophagitis: Secondary | ICD-10-CM | POA: Diagnosis present

## 2016-07-02 DIAGNOSIS — L0291 Cutaneous abscess, unspecified: Secondary | ICD-10-CM

## 2016-07-02 DIAGNOSIS — K4090 Unilateral inguinal hernia, without obstruction or gangrene, not specified as recurrent: Secondary | ICD-10-CM | POA: Diagnosis present

## 2016-07-02 DIAGNOSIS — Z8701 Personal history of pneumonia (recurrent): Secondary | ICD-10-CM

## 2016-07-02 DIAGNOSIS — Z7984 Long term (current) use of oral hypoglycemic drugs: Secondary | ICD-10-CM

## 2016-07-02 DIAGNOSIS — L089 Local infection of the skin and subcutaneous tissue, unspecified: Secondary | ICD-10-CM | POA: Diagnosis not present

## 2016-07-02 DIAGNOSIS — F419 Anxiety disorder, unspecified: Secondary | ICD-10-CM | POA: Diagnosis present

## 2016-07-02 DIAGNOSIS — Z7982 Long term (current) use of aspirin: Secondary | ICD-10-CM

## 2016-07-02 DIAGNOSIS — Z8711 Personal history of peptic ulcer disease: Secondary | ICD-10-CM

## 2016-07-02 DIAGNOSIS — F17200 Nicotine dependence, unspecified, uncomplicated: Secondary | ICD-10-CM | POA: Diagnosis present

## 2016-07-02 LAB — COMPREHENSIVE METABOLIC PANEL
ALT: 10 U/L — ABNORMAL LOW (ref 17–63)
ANION GAP: 8 (ref 5–15)
AST: 19 U/L (ref 15–41)
Albumin: 2.9 g/dL — ABNORMAL LOW (ref 3.5–5.0)
Alkaline Phosphatase: 44 U/L (ref 38–126)
BUN: 17 mg/dL (ref 6–20)
CHLORIDE: 104 mmol/L (ref 101–111)
CO2: 28 mmol/L (ref 22–32)
Calcium: 8.7 mg/dL — ABNORMAL LOW (ref 8.9–10.3)
Creatinine, Ser: 0.94 mg/dL (ref 0.61–1.24)
GFR calc non Af Amer: >60 mL/min (ref >60)
Glucose, Bld: 133 mg/dL — ABNORMAL HIGH (ref 65–99)
Potassium: 3.7 mmol/L (ref 3.5–5.1)
SODIUM: 140 mmol/L (ref 135–145)
Total Bilirubin: 0.5 mg/dL (ref 0.3–1.2)
Total Protein: 6.3 g/dL — ABNORMAL LOW (ref 6.5–8.1)

## 2016-07-02 LAB — CBC WITH DIFFERENTIAL/PLATELET
Basophils Absolute: 0.1 10*3/uL (ref 0.0–0.1)
Basophils Relative: 1 %
Eosinophils Absolute: 0.8 10*3/uL — ABNORMAL HIGH (ref 0.0–0.7)
Eosinophils Relative: 6 %
HCT: 37.6 % — ABNORMAL LOW (ref 39.0–52.0)
HEMOGLOBIN: 12.7 g/dL — AB (ref 13.0–17.0)
LYMPHS ABS: 5 10*3/uL — AB (ref 0.7–4.0)
LYMPHS PCT: 37 %
MCH: 29.5 pg (ref 26.0–34.0)
MCHC: 33.8 g/dL (ref 30.0–36.0)
MCV: 87.4 fL (ref 78.0–100.0)
Monocytes Absolute: 1.1 10*3/uL — ABNORMAL HIGH (ref 0.1–1.0)
Monocytes Relative: 8 %
NEUTROS PCT: 48 %
Neutro Abs: 6.7 10*3/uL (ref 1.7–7.7)
Platelets: 486 10*3/uL — ABNORMAL HIGH (ref 150–400)
RBC: 4.3 MIL/uL (ref 4.22–5.81)
RDW: 14.9 % (ref 11.5–15.5)
WBC: 13.7 10*3/uL — AB (ref 4.0–10.5)

## 2016-07-02 LAB — URINALYSIS, ROUTINE W REFLEX MICROSCOPIC
BACTERIA UA: NONE SEEN
BILIRUBIN URINE: NEGATIVE
Glucose, UA: NEGATIVE mg/dL
Ketones, ur: NEGATIVE mg/dL
NITRITE: NEGATIVE
PROTEIN: NEGATIVE mg/dL
SPECIFIC GRAVITY, URINE: 1.021 (ref 1.005–1.030)
pH: 5 (ref 5.0–8.0)

## 2016-07-02 LAB — PROTIME-INR
INR: 1.08
Prothrombin Time: 14 seconds (ref 11.4–15.2)

## 2016-07-02 LAB — I-STAT CG4 LACTIC ACID, ED: Lactic Acid, Venous: 1.31 mmol/L (ref 0.5–1.9)

## 2016-07-02 NOTE — ED Triage Notes (Signed)
The pt was seen by dr Lucia Gaskins and was told he had infection in the groin

## 2016-07-02 NOTE — ED Triage Notes (Signed)
The pt  Has a groin hernia that ruptured 5 days ago  Since then he has had drainage from that area  Temp unknown
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
## 2016-07-02 NOTE — Telephone Encounter (Signed)
Called pt about his urology appointment. He explained about his scrotum bleeding and infected. Spoke with Roselyn Reef Bullins and instructed patient to go to the ER

## 2016-07-03 ENCOUNTER — Emergency Department (HOSPITAL_COMMUNITY): Payer: Medicare Other

## 2016-07-03 DIAGNOSIS — I209 Angina pectoris, unspecified: Secondary | ICD-10-CM

## 2016-07-03 DIAGNOSIS — G8929 Other chronic pain: Secondary | ICD-10-CM | POA: Diagnosis present

## 2016-07-03 DIAGNOSIS — N492 Inflammatory disorders of scrotum: Secondary | ICD-10-CM | POA: Diagnosis present

## 2016-07-03 DIAGNOSIS — E1159 Type 2 diabetes mellitus with other circulatory complications: Secondary | ICD-10-CM

## 2016-07-03 DIAGNOSIS — R109 Unspecified abdominal pain: Secondary | ICD-10-CM | POA: Diagnosis not present

## 2016-07-03 DIAGNOSIS — N433 Hydrocele, unspecified: Secondary | ICD-10-CM | POA: Diagnosis not present

## 2016-07-03 DIAGNOSIS — Z4682 Encounter for fitting and adjustment of non-vascular catheter: Secondary | ICD-10-CM | POA: Diagnosis not present

## 2016-07-03 DIAGNOSIS — K5669 Other partial intestinal obstruction: Secondary | ICD-10-CM | POA: Diagnosis not present

## 2016-07-03 DIAGNOSIS — K219 Gastro-esophageal reflux disease without esophagitis: Secondary | ICD-10-CM | POA: Diagnosis present

## 2016-07-03 DIAGNOSIS — K5651 Intestinal adhesions [bands], with partial obstruction: Secondary | ICD-10-CM | POA: Diagnosis present

## 2016-07-03 DIAGNOSIS — R1084 Generalized abdominal pain: Secondary | ICD-10-CM | POA: Diagnosis not present

## 2016-07-03 DIAGNOSIS — N400 Enlarged prostate without lower urinary tract symptoms: Secondary | ICD-10-CM | POA: Diagnosis present

## 2016-07-03 DIAGNOSIS — N390 Urinary tract infection, site not specified: Secondary | ICD-10-CM | POA: Diagnosis present

## 2016-07-03 DIAGNOSIS — K4090 Unilateral inguinal hernia, without obstruction or gangrene, not specified as recurrent: Secondary | ICD-10-CM | POA: Diagnosis present

## 2016-07-03 DIAGNOSIS — I25119 Atherosclerotic heart disease of native coronary artery with unspecified angina pectoris: Secondary | ICD-10-CM | POA: Diagnosis not present

## 2016-07-03 DIAGNOSIS — Z7982 Long term (current) use of aspirin: Secondary | ICD-10-CM | POA: Diagnosis not present

## 2016-07-03 DIAGNOSIS — E782 Mixed hyperlipidemia: Secondary | ICD-10-CM | POA: Diagnosis present

## 2016-07-03 DIAGNOSIS — K449 Diaphragmatic hernia without obstruction or gangrene: Secondary | ICD-10-CM | POA: Diagnosis not present

## 2016-07-03 DIAGNOSIS — N179 Acute kidney failure, unspecified: Secondary | ICD-10-CM | POA: Diagnosis present

## 2016-07-03 DIAGNOSIS — Z951 Presence of aortocoronary bypass graft: Secondary | ICD-10-CM | POA: Diagnosis not present

## 2016-07-03 DIAGNOSIS — I2510 Atherosclerotic heart disease of native coronary artery without angina pectoris: Secondary | ICD-10-CM

## 2016-07-03 DIAGNOSIS — I10 Essential (primary) hypertension: Secondary | ICD-10-CM | POA: Diagnosis not present

## 2016-07-03 DIAGNOSIS — E43 Unspecified severe protein-calorie malnutrition: Secondary | ICD-10-CM | POA: Diagnosis not present

## 2016-07-03 DIAGNOSIS — K56609 Unspecified intestinal obstruction, unspecified as to partial versus complete obstruction: Secondary | ICD-10-CM | POA: Diagnosis not present

## 2016-07-03 DIAGNOSIS — M549 Dorsalgia, unspecified: Secondary | ICD-10-CM | POA: Diagnosis present

## 2016-07-03 DIAGNOSIS — N498 Inflammatory disorders of other specified male genital organs: Secondary | ICD-10-CM

## 2016-07-03 DIAGNOSIS — B961 Klebsiella pneumoniae [K. pneumoniae] as the cause of diseases classified elsewhere: Secondary | ICD-10-CM | POA: Diagnosis present

## 2016-07-03 DIAGNOSIS — E559 Vitamin D deficiency, unspecified: Secondary | ICD-10-CM | POA: Diagnosis present

## 2016-07-03 DIAGNOSIS — E1151 Type 2 diabetes mellitus with diabetic peripheral angiopathy without gangrene: Secondary | ICD-10-CM | POA: Diagnosis not present

## 2016-07-03 DIAGNOSIS — I70209 Unspecified atherosclerosis of native arteries of extremities, unspecified extremity: Secondary | ICD-10-CM | POA: Diagnosis not present

## 2016-07-03 DIAGNOSIS — F17210 Nicotine dependence, cigarettes, uncomplicated: Secondary | ICD-10-CM | POA: Diagnosis present

## 2016-07-03 DIAGNOSIS — G4733 Obstructive sleep apnea (adult) (pediatric): Secondary | ICD-10-CM | POA: Diagnosis present

## 2016-07-03 DIAGNOSIS — Z23 Encounter for immunization: Secondary | ICD-10-CM | POA: Diagnosis not present

## 2016-07-03 DIAGNOSIS — E876 Hypokalemia: Secondary | ICD-10-CM | POA: Diagnosis present

## 2016-07-03 DIAGNOSIS — K5909 Other constipation: Secondary | ICD-10-CM | POA: Diagnosis present

## 2016-07-03 LAB — GLUCOSE, CAPILLARY
GLUCOSE-CAPILLARY: 77 mg/dL (ref 65–99)
GLUCOSE-CAPILLARY: 78 mg/dL (ref 65–99)
GLUCOSE-CAPILLARY: 130 mg/dL — AB (ref 65–99)
Glucose-Capillary: 85 mg/dL (ref 65–99)
Glucose-Capillary: 119 mg/dL — ABNORMAL HIGH (ref 65–99)

## 2016-07-03 LAB — I-STAT CG4 LACTIC ACID, ED: LACTIC ACID, VENOUS: 1.07 mmol/L (ref 0.5–1.9)

## 2016-07-03 MED ORDER — OXYCODONE HCL 5 MG PO TABS
5.0000 mg | ORAL_TABLET | Freq: Every day | ORAL | Status: DC
  Administered 2016-07-03 – 2016-07-07 (×19): 5 mg via ORAL
  Filled 2016-07-03 (×19): qty 1

## 2016-07-03 MED ORDER — IOPAMIDOL (ISOVUE-300) INJECTION 61%
INTRAVENOUS | Status: AC
  Administered 2016-07-03: 100 mL
  Filled 2016-07-03: qty 100

## 2016-07-03 MED ORDER — SODIUM CHLORIDE 0.9 % IV BOLUS (SEPSIS)
1000.0000 mL | Freq: Once | INTRAVENOUS | Status: AC
  Administered 2016-07-03: 1000 mL via INTRAVENOUS

## 2016-07-03 MED ORDER — DEXTROSE 5 % IV SOLN
2.0000 g | Freq: Once | INTRAVENOUS | Status: AC
  Administered 2016-07-03: 2 g via INTRAVENOUS
  Filled 2016-07-03: qty 2

## 2016-07-03 MED ORDER — OXYCODONE HCL 5 MG PO TABS: 5.0000 mg | ORAL_TABLET | Freq: Every day | ORAL | Status: DC | PRN

## 2016-07-03 MED ORDER — MORPHINE SULFATE ER 30 MG PO TBCR
30.0000 mg | EXTENDED_RELEASE_TABLET | Freq: Three times a day (TID) | ORAL | Status: DC
  Administered 2016-07-03 – 2016-07-07 (×11): 30 mg via ORAL
  Filled 2016-07-03 (×11): qty 1

## 2016-07-03 MED ORDER — SUCRALFATE 1 G PO TABS
1.0000 g | ORAL_TABLET | Freq: Three times a day (TID) | ORAL | Status: DC
  Administered 2016-07-03 – 2016-07-06 (×11): 1 g via ORAL
  Filled 2016-07-03 (×14): qty 1

## 2016-07-03 MED ORDER — OXYCODONE-ACETAMINOPHEN 5-325 MG PO TABS
1.0000 | ORAL_TABLET | Freq: Once | ORAL | Status: AC
  Administered 2016-07-06: 1 via ORAL
  Filled 2016-07-03: qty 1

## 2016-07-03 MED ORDER — DIAZEPAM 5 MG PO TABS: 10.0000 mg | ORAL_TABLET | Freq: Two times a day (BID) | ORAL | Status: DC | PRN

## 2016-07-03 MED ORDER — PIPERACILLIN-TAZOBACTAM 3.375 G IVPB 30 MIN: 3.3750 g | Freq: Four times a day (QID) | INTRAVENOUS | Status: DC

## 2016-07-03 MED ORDER — FENTANYL CITRATE (PF) 100 MCG/2ML IJ SOLN
25.0000 ug | Freq: Once | INTRAMUSCULAR | Status: AC
  Administered 2016-07-03: 25 ug via INTRAVENOUS
  Filled 2016-07-03: qty 2

## 2016-07-03 MED ORDER — LIDOCAINE-EPINEPHRINE (PF) 2 %-1:200000 IJ SOLN
20.0000 mL | Freq: Once | INTRAMUSCULAR | Status: AC
  Administered 2016-07-03: 20 mL via INTRADERMAL
  Filled 2016-07-03: qty 20

## 2016-07-03 MED ORDER — VITAMIN D 1000 UNITS PO TABS
1000.0000 [IU] | ORAL_TABLET | Freq: Every day | ORAL | Status: DC
  Administered 2016-07-03 – 2016-07-06 (×3): 1000 [IU] via ORAL
  Filled 2016-07-03 (×6): qty 1

## 2016-07-03 MED ORDER — ATORVASTATIN CALCIUM 10 MG PO TABS
20.0000 mg | ORAL_TABLET | Freq: Every day | ORAL | Status: DC
  Administered 2016-07-03 – 2016-07-06 (×3): 20 mg via ORAL
  Filled 2016-07-03 (×4): qty 2

## 2016-07-03 MED ORDER — FENOFIBRATE 160 MG PO TABS
160.0000 mg | ORAL_TABLET | Freq: Every day | ORAL | Status: DC
  Administered 2016-07-03 – 2016-07-06 (×3): 160 mg via ORAL
  Filled 2016-07-03 (×5): qty 1

## 2016-07-03 MED ORDER — INSULIN ASPART 100 UNIT/ML ~~LOC~~ SOLN
0.0000 [IU] | SUBCUTANEOUS | Status: DC
  Administered 2016-07-03 – 2016-07-04 (×2): 1 [IU] via SUBCUTANEOUS
  Administered 2016-07-04 – 2016-07-05 (×5): 2 [IU] via SUBCUTANEOUS
  Administered 2016-07-05: 1 [IU] via SUBCUTANEOUS
  Administered 2016-07-05 – 2016-07-06 (×2): 2 [IU] via SUBCUTANEOUS
  Administered 2016-07-06 – 2016-07-07 (×7): 1 [IU] via SUBCUTANEOUS
  Administered 2016-07-07: 2 [IU] via SUBCUTANEOUS
  Administered 2016-07-08: 1 [IU] via SUBCUTANEOUS
  Administered 2016-07-08: 2 [IU] via SUBCUTANEOUS
  Administered 2016-07-08: 1 [IU] via SUBCUTANEOUS
  Administered 2016-07-09 (×5): 2 [IU] via SUBCUTANEOUS
  Administered 2016-07-09 – 2016-07-12 (×11): 1 [IU] via SUBCUTANEOUS
  Administered 2016-07-13: 2 [IU] via SUBCUTANEOUS
  Administered 2016-07-13 (×2): 1 [IU] via SUBCUTANEOUS

## 2016-07-03 MED ORDER — QUINAPRIL HCL 10 MG PO TABS: 20.0000 mg | ORAL_TABLET | Freq: Every day | ORAL | Status: DC

## 2016-07-03 MED ORDER — OXYCODONE-ACETAMINOPHEN 5-325 MG PO TABS
1.0000 | ORAL_TABLET | Freq: Every day | ORAL | Status: DC
  Administered 2016-07-03 – 2016-07-07 (×18): 1 via ORAL
  Filled 2016-07-03 (×18): qty 1

## 2016-07-03 MED ORDER — SENNOSIDES-DOCUSATE SODIUM 8.6-50 MG PO TABS
1.0000 | ORAL_TABLET | ORAL | Status: DC | PRN
  Administered 2016-07-03: 1 via ORAL
  Filled 2016-07-03 (×2): qty 1

## 2016-07-03 MED ORDER — AMLODIPINE BESYLATE 10 MG PO TABS
10.0000 mg | ORAL_TABLET | Freq: Every day | ORAL | Status: DC
  Administered 2016-07-03 – 2016-07-06 (×4): 10 mg via ORAL
  Filled 2016-07-03 (×5): qty 1

## 2016-07-03 MED ORDER — LISINOPRIL 20 MG PO TABS
20.0000 mg | ORAL_TABLET | Freq: Every day | ORAL | Status: DC
  Administered 2016-07-03 – 2016-07-06 (×4): 20 mg via ORAL
  Filled 2016-07-03 (×5): qty 1

## 2016-07-03 MED ORDER — PANTOPRAZOLE SODIUM 40 MG PO TBEC
40.0000 mg | DELAYED_RELEASE_TABLET | Freq: Two times a day (BID) | ORAL | Status: DC
  Administered 2016-07-03 – 2016-07-04 (×3): 40 mg via ORAL
  Filled 2016-07-03 (×3): qty 1

## 2016-07-03 MED ORDER — VANCOMYCIN HCL IN DEXTROSE 1-5 GM/200ML-% IV SOLN
1000.0000 mg | Freq: Once | INTRAVENOUS | Status: AC
  Administered 2016-07-03: 1000 mg via INTRAVENOUS
  Filled 2016-07-03: qty 200

## 2016-07-03 MED ORDER — INFLUENZA VAC SPLIT QUAD 0.5 ML IM SUSY
0.5000 mL | PREFILLED_SYRINGE | INTRAMUSCULAR | Status: DC
  Filled 2016-07-03: qty 0.5

## 2016-07-03 MED ORDER — SODIUM CHLORIDE 0.9 % IV SOLN
INTRAVENOUS | Status: AC
  Administered 2016-07-03: 04:00:00 via INTRAVENOUS

## 2016-07-03 MED ORDER — METOPROLOL TARTRATE 25 MG PO TABS
25.0000 mg | ORAL_TABLET | Freq: Two times a day (BID) | ORAL | Status: DC
  Administered 2016-07-03 – 2016-07-06 (×7): 25 mg via ORAL
  Filled 2016-07-03 (×9): qty 1

## 2016-07-03 MED ORDER — PIPERACILLIN-TAZOBACTAM 3.375 G IVPB
3.3750 g | Freq: Three times a day (TID) | INTRAVENOUS | Status: DC
  Administered 2016-07-03 – 2016-07-08 (×15): 3.375 g via INTRAVENOUS
  Filled 2016-07-03 (×14): qty 50

## 2016-07-03 MED ORDER — OXYCODONE-ACETAMINOPHEN 10-325 MG PO TABS: 1.0000 | ORAL_TABLET | Freq: Every day | ORAL | Status: DC

## 2016-07-03 MED ORDER — OMEGA-3-ACID ETHYL ESTERS 1 G PO CAPS
1.0000 g | ORAL_CAPSULE | Freq: Every day | ORAL | Status: DC
  Administered 2016-07-03 – 2016-07-06 (×3): 1 g via ORAL
  Filled 2016-07-03 (×4): qty 1

## 2016-07-03 MED ORDER — DEXTROSE 5 % IV SOLN
1.0000 g | Freq: Two times a day (BID) | INTRAVENOUS | Status: DC
  Filled 2016-07-03: qty 1

## 2016-07-03 MED ORDER — VANCOMYCIN HCL IN DEXTROSE 1-5 GM/200ML-% IV SOLN
1000.0000 mg | Freq: Two times a day (BID) | INTRAVENOUS | Status: DC
Start: 2016-07-03 — End: 2016-07-07
  Administered 2016-07-03 – 2016-07-07 (×9): 1000 mg via INTRAVENOUS
  Filled 2016-07-03 (×8): qty 200

## 2016-07-03 MED ORDER — ASPIRIN 81 MG PO TBEC: 162.0000 mg | DELAYED_RELEASE_TABLET | Freq: Every day | ORAL | Status: DC

## 2016-07-03 MED ORDER — ISOSORBIDE MONONITRATE ER 30 MG PO TB24
30.0000 mg | ORAL_TABLET | Freq: Every day | ORAL | Status: DC
  Administered 2016-07-03 – 2016-07-06 (×4): 30 mg via ORAL
  Filled 2016-07-03 (×5): qty 1
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
## 2016-07-03 NOTE — Progress Notes (Signed)
Paged Dr. Jonny Ruiz to report patient bleeding from incision after using bathroom and pt is very concerned.  Gauze changed, panty changed, and patient back in bed.  Physician reports some bleeding is normal and patient notified.  No bleeding after gauze/dressing change and patient in bed.

## 2016-07-03 NOTE — Procedures (Signed)
   Urology Procedure Note   Pre-procedure diagnosis: Scrotal wall abscess  Post-procedure diagnosis: Same  Procedure: Incision and drainage of scrotal wall abscess  Surgeon: Dr. Diona Fanti Resident: Dr. Jonny Ruiz  EBL: Minimal, <5cc  Culture: Wound culture, aerobic/anaerobic, scrotal abscess. ADDENDUM: Anaerobic swabs are apparently not stocked in this hospital, thus micro only able to run aerobic culture.  Procedure Details: The patient was prepped and draped in the supine frogleg position in standard fashion with Betadine. A mixture of 2% Lidocaine with epinephrine was instilled into the draining abscess and surrounding areas. He was confirmed insensate prior to making an incision with a #11 blade scalpel. A cruciate incision was made. Immediately additional purulent fluid drained. Culture swab was obtained and sent. The wound loculations were finger fractured. The cavity was copiously irrigated with saline. The wound was then packed with 1/2 inch Iodoform sterile gauze. The patient tolerated the procedure well. There were no immediate complications.   Dr. Diona Fanti was available.  Lorayne Bender, MD PGY4 Urology Resident

## 2016-07-03 NOTE — Progress Notes (Signed)
BP 118/66 (BP Location: Left Arm)   Pulse 63   Temp 98 F (36.7 C) (Oral)   Resp 16   Ht 5\' 11"  (1.803 m)   Wt 87.4 kg (192 lb 10.9 oz)   SpO2 97%   BMI 26.87 kg/m   Agree with plan as detailed by admitting physician. Has remained afebrile urology to perform IND on 07/03/2016. Has remained afebrile, discontinue cefepime started on IV Zosyn to cover anaerobes, he does have an allergy to penicillin which is a rash. Patient nothing by mouth.
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
## 2016-07-03 NOTE — Progress Notes (Signed)
Pharmacy Antibiotic Note  Dustin Dominguez is a 75 y.o. male admitted on 07/02/2016 with scrotal wound infection.  Pharmacy has been consulted for Vancomycin and Cefepime dosing.  Cefepime 2gm IV given in ED ~0030 and Vanc 1gm given in ED ~0200  Plan: Cefepime 1gm IV q12h Vancomycin 1gm IV q12h Will f/u micro data, renal function, and pt's clinical condition Vanc trough prn   Height: 5\' 11"  (180.3 cm) Weight: 188 lb (85.3 kg) IBW/kg (Calculated) : 75.3  Temp (24hrs), Avg:98.3 F (36.8 C), Min:98.3 F (36.8 C), Max:98.3 F (36.8 C)   Recent Labs Lab 07/02/16 2102 07/02/16 2113 07/03/16 0039  WBC 13.7*  --   --   CREATININE 0.94  --   --   LATICACIDVEN  --  1.31 1.07    Estimated Creatinine Clearance: 74.5 mL/min (by C-G formula based on SCr of 0.94 mg/dL).    Allergies  Allergen Reactions  . Amitriptyline Other (See Comments)    sleepy  . Bextra [Valdecoxib] Other (See Comments)    unknown  . Codeine Nausea And Vomiting  . Cymbalta [Duloxetine Hcl] Swelling and Other (See Comments)    dizzy  . Esomeprazole Magnesium Diarrhea  . Niacin-Lovastatin Er Other (See Comments)    headache  . Niaspan [Niacin Er] Other (See Comments)    Increased headache  . Penicillins Rash    Has patient had a PCN reaction causing immediate rash, facial/tongue/throat swelling, SOB or lightheadedness with hypotension: Yes Has patient had a PCN reaction causing severe rash involving mucus membranes or skin necrosis: No Has patient had a PCN reaction that required hospitalization No Has patient had a PCN reaction occurring within the last 10 years: No If all of the above answers are "NO", then may proceed with Cephalosporin use.     Antimicrobials this admission: 12/16 Vanc >>  12/16 Cefepime >>   Dose adjustments this admission: n/a  Microbiology results: 12/16 BCx x2:  12/16 UCx:    Thank you for allowing pharmacy to be a part of this patient's care.  Sherlon Handing, PharmD,  BCPS Clinical pharmacist, pager [phone] 07/03/2016 2:58 AM

## 2016-07-03 NOTE — Progress Notes (Signed)
PHARMACY NOTE:  ANTIMICROBIAL RENAL DOSAGE ADJUSTMENT  Current antimicrobial regimen includes a mismatch between antimicrobial dosage and estimated renal function.  As per policy approved by the Pharmacy & Therapeutics and Medical Executive Committees, the antimicrobial dosage will be adjusted accordingly.  Current antimicrobial dosage:  Zosyn 3.375g IV q6h  Indication: Cellulitis / abscess  Renal Function:   Estimated Creatinine Clearance: 74.5 mL/min (by C-G formula based on SCr of 0.94 mg/dL). []      On intermittent HD, scheduled: []      On CRRT    Antimicrobial dosage has been changed to:  Zosyn 3.375g IV q8h (infuse over 4 hours)   Additional comments:   Thank you for allowing pharmacy to be a part of this patient's care.  Ralene Bathe, PharmD, BCPS 07/03/2016, 10:01 AM  Pager: [phone]

## 2016-07-04 DIAGNOSIS — R109 Unspecified abdominal pain: Secondary | ICD-10-CM | POA: Clinically undetermined

## 2016-07-04 DIAGNOSIS — F17200 Nicotine dependence, unspecified, uncomplicated: Secondary | ICD-10-CM

## 2016-07-04 DIAGNOSIS — R1084 Generalized abdominal pain: Secondary | ICD-10-CM | POA: Clinically undetermined

## 2016-07-04 LAB — CBC WITH DIFFERENTIAL/PLATELET
Basophils Absolute: 0.1 10*3/uL (ref 0.0–0.1)
Basophils Relative: 1 %
EOS ABS: 0.8 10*3/uL — AB (ref 0.0–0.7)
Eosinophils Relative: 5 %
HCT: 34.9 % — ABNORMAL LOW (ref 39.0–52.0)
HEMOGLOBIN: 12 g/dL — AB (ref 13.0–17.0)
LYMPHS ABS: 5.4 10*3/uL — AB (ref 0.7–4.0)
Lymphocytes Relative: 33 %
MCH: 29.7 pg (ref 26.0–34.0)
MCHC: 34.4 g/dL (ref 30.0–36.0)
MCV: 86.4 fL (ref 78.0–100.0)
MONO ABS: 1.4 10*3/uL — AB (ref 0.1–1.0)
MONOS PCT: 9 %
NEUTROS PCT: 52 %
Neutro Abs: 8.5 10*3/uL — ABNORMAL HIGH (ref 1.7–7.7)
Platelets: 403 10*3/uL — ABNORMAL HIGH (ref 150–400)
RBC: 4.04 MIL/uL — ABNORMAL LOW (ref 4.22–5.81)
RDW: 14.9 % (ref 11.5–15.5)
WBC: 16.2 10*3/uL — ABNORMAL HIGH (ref 4.0–10.5)

## 2016-07-04 LAB — BASIC METABOLIC PANEL
Anion gap: 4 — ABNORMAL LOW (ref 5–15)
BUN: 18 mg/dL (ref 6–20)
CALCIUM: 8.6 mg/dL — AB (ref 8.9–10.3)
CHLORIDE: 105 mmol/L (ref 101–111)
CO2: 32 mmol/L (ref 22–32)
CREATININE: 1.15 mg/dL (ref 0.61–1.24)
GFR calc non Af Amer: >60 mL/min (ref >60)
GLUCOSE: 101 mg/dL — AB (ref 65–99)
Potassium: 4.2 mmol/L (ref 3.5–5.1)
Sodium: 141 mmol/L (ref 135–145)

## 2016-07-04 LAB — GLUCOSE, CAPILLARY
GLUCOSE-CAPILLARY: 109 mg/dL — AB (ref 65–99)
GLUCOSE-CAPILLARY: 128 mg/dL — AB (ref 65–99)
GLUCOSE-CAPILLARY: 157 mg/dL — AB (ref 65–99)
GLUCOSE-CAPILLARY: 97 mg/dL (ref 65–99)
Glucose-Capillary: 81 mg/dL (ref 65–99)
Glucose-Capillary: 116 mg/dL — ABNORMAL HIGH (ref 65–99)

## 2016-07-04 MED ORDER — PANTOPRAZOLE SODIUM 40 MG IV SOLR
40.0000 mg | Freq: Two times a day (BID) | INTRAVENOUS | Status: DC
  Administered 2016-07-04 – 2016-07-14 (×20): 40 mg via INTRAVENOUS
  Filled 2016-07-04 (×21): qty 40

## 2016-07-04 MED ORDER — GI COCKTAIL ~~LOC~~: 30.0000 mL | Freq: Three times a day (TID) | ORAL | Status: DC | PRN

## 2016-07-04 MED ORDER — LACTULOSE 10 GM/15ML PO SOLN
30.0000 g | Freq: Once | ORAL | Status: AC
  Administered 2016-07-04: 30 g via ORAL
  Filled 2016-07-04: qty 45

## 2016-07-04 MED ORDER — SENNOSIDES-DOCUSATE SODIUM 8.6-50 MG PO TABS
1.0000 | ORAL_TABLET | Freq: Two times a day (BID) | ORAL | Status: DC
  Administered 2016-07-04 – 2016-07-06 (×5): 1 via ORAL
  Filled 2016-07-04 (×6): qty 1

## 2016-07-04 MED ORDER — POLYETHYLENE GLYCOL 3350 17 G PO PACK
17.0000 g | PACK | Freq: Two times a day (BID) | ORAL | Status: DC
  Administered 2016-07-04 – 2016-07-06 (×4): 17 g via ORAL
  Filled 2016-07-04 (×6): qty 1
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
## 2016-07-04 NOTE — Progress Notes (Signed)
Follow-up Consult Note  Requesting Physician:  Ezequiel Essex, MD Service Requesting Consult:  Emergency Med  Consult Attending: Kylani Wires Reason for consult: Scrotal wall abscess   Subjective: Afebrile. Pain well tolerated. Cultures pending. Has drained some bloody drainage but otherwise stable. Nursing to change today, perform teaching with patient.   Objective   Vital signs in last 24 hours: BP 130/60 (BP Location: Left Arm)   Pulse (!) 52   Temp 98.4 F (36.9 C) (Oral)   Resp 16   Ht 5\' 11"  (1.803 m)   Wt 87.4 kg (192 lb 10.9 oz)   SpO2 97%   BMI 26.87 kg/m   Intake/Output last 3 shifts: I/O last 3 completed shifts: In: [id number] [I.V.:205; IV Piggyback:1550] Out: -   Physical Exam General: NAD, A&O, resting, appropriate HEENT: [place]/AT, EOMI, MMM Pulmonary: Normal work of breathing Cardiovascular: Regular rate, HDS, adequate peripheral perfusion Abdomen: soft, NTTP, nondistended, no suprapubic fullness or tenderness Gu: s/p scrotal wall abscess I&D to left hemiscrotum, packing in place Extremities: warm and well perfused, no edema Neuro: Appropriate, no focal neurological deficits  Most Recent Labs: Lab Results  Component Value Date   WBC 13.7 (H) 07/02/2016   HGB 12.7 (L) 07/02/2016   HCT 37.6 (L) 07/02/2016   PLT 486 (H) 07/02/2016    Lab Results  Component Value Date   NA 140 07/02/2016   K 3.7 07/02/2016   CL 104 07/02/2016   CO2 28 07/02/2016   BUN 17 07/02/2016   CREATININE 0.94 07/02/2016   CALCIUM 8.7 (L) 07/02/2016    Lab Results  Component Value Date   ALKPHOS 44 07/02/2016   BILITOT 0.5 07/02/2016   BILIDIR 0.31 06/16/2016   PROT 6.3 (L) 07/02/2016   ALBUMIN 2.9 (L) 07/02/2016   ALT 10 (L) 07/02/2016   AST 19 07/02/2016    Lab Results  Component Value Date   INR 1.08 07/02/2016     Cultures: Pending  IMAGING: Dg Chest 2 View  Result Date: 07/02/2016 CLINICAL DATA:  Patient complains of infection of inguinal hernia for 3  weeks. EXAM: CHEST  2 VIEW COMPARISON:  04/07/2016 FINDINGS: Postsurgical changes from CABG are stable. Cardiomediastinal silhouette is normal. Mediastinal contours appear intact. Stable elevation of the right hemidiaphragm. There is no evidence of focal airspace consolidation, pleural effusion or pneumothorax. Osseous structures are without acute abnormality. Stable posttraumatic changes in the right chest wall. Soft tissues are grossly normal. IMPRESSION: No active cardiopulmonary disease. Electronically Signed   By: Fidela Salisbury M.D.   On: 07/02/2016 21:14   US Scrotum  Result Date: 07/03/2016 CLINICAL DATA:  75 year old male with left scrotal abscess. EXAM: SCROTAL ULTRASOUND DOPPLER ULTRASOUND OF THE TESTICLES TECHNIQUE: Complete ultrasound examination of the testicles, epididymis and other scrotal structures was performed. Color and spectral Doppler ultrasound were also utilized to evaluate blood flow to the testicles. COMPARISON:  None. Ultrasound dated 06/16/2016 and CT dated 07/03/2016 FINDINGS: Right testicle Measurements: 3.1 x 2.3 x 2.5 cm. No mass or microlithiasis visualized. Left testicle Measurements: 3.0 x 2.3 x 2.9 cm. The left testicle is heterogeneous. There is a 6.5 x 4.0 x 5.0 cm complex heterogeneous hypoechoic area in the inferior and lateral aspect of the left scrotal wall with surrounding hyperemia most compatible with and developing abscess. This corresponds to the peripherally enhancing, low attenuating area in the inferior aspect of the left scrotal wall seen on the today's CT. Right epididymis:  Normal in size and appearance. Left epididymis:  Normal in size  and appearance. Hydrocele:  There is a small left hydrocele. Varicocele:  None visualized. Pulsed Doppler interrogation of both testes demonstrates normal low resistance arterial and venous waveforms bilaterally. IMPRESSION: Unremarkable testicles. Complex hypoechoic area with surrounding hyperemia in the inferior aspect  of the left scrotal mole compatible with phlegmon or developing abscess. Small left hydrocele. Preserved testicular perfusion. Electronically Signed   By: Anner Crete M.D.   On: 07/03/2016 01:38   Ct Abdomen Pelvis W Contrast  Result Date: 07/03/2016 CLINICAL DATA:  Ruptured hernia 5 days ago, persistent drainage. History of colon cancer, splenectomy, cholecystectomy, liver surgery. EXAM: CT ABDOMEN AND PELVIS WITH CONTRAST TECHNIQUE: Multidetector CT imaging of the abdomen and pelvis was performed using the standard protocol following bolus administration of intravenous contrast. CONTRAST:  116mL ISOVUE-300 IOPAMIDOL (ISOVUE-300) INJECTION 61% COMPARISON:  Scrotal ultrasound July 03, 2016 at 0112 hours and CT abdomen and pelvis April 15, 2016 FINDINGS: LOWER CHEST: RIGHT lung base atelectasis. RIGHT calcified pleural plaques. Old RIGHT posterior rib fractures. Status post median sternotomy. Included heart size is normal. Severe coronary artery calcifications versus stents. No pericardial effusion. HEPATOBILIARY: Postsurgical change of liver, with coarse calcifications RIGHT lobe. status post cholecystectomy. Liver dome is not fully imaged. Mild intrahepatic biliary dilatation is likely postprocedural. PANCREAS: Normal. SPLEEN: Surgically absent. ADRENALS/URINARY TRACT: Kidneys are orthotopic, demonstrating symmetric enhancement. No nephrolithiasis, hydronephrosis or solid renal masses. 4.8 cm cyst upper pole LEFT kidney. The unopacified ureters are normal in course and caliber. Delayed imaging through the kidneys demonstrates symmetric prompt contrast excretion within the proximal urinary collecting system. Urinary bladder is partially distended containing nondependent gas. RIGHT adrenalectomy. STOMACH/BOWEL: Focally thickened gastric cardia. Small and large bowel are normal in caliber without inflammatory changes. Moderate amount of retained large bowel stool. Redundant LEFT colon. Mild colonic  diverticulosis. VASCULAR/LYMPHATIC: Aortoiliac vessels are normal in course and caliber, moderate calcific atherosclerosis. No lymphadenopathy by CT size criteria. REPRODUCTIVE: [place]cent[place] 3.3 x 1.3 cm rim enhancing fluid collection within LEFT scrotum, inferior to the testicle. No subcutaneous gas. Small LEFT hydrocele. OTHER: No intraperitoneal free fluid or free air. MUSCULOSKELETAL: Nonacute. Atrophic RIGHT anterior abdominal wall muscles with ligamentous laxity. Moderate LEFT and small RIGHT fat containing inguinal hernias. Symmetrically sclerotic humeral heads without collapse. Findings may be due to generalized osteopenia and preserved mineralization. Mild Old L2 compression fracture. IMPRESSION: 3.3 x 1.3 cm LEFT scrotal extratesticular abscess with effusion. No subcutaneous gas. Moderate LEFT and small RIGHT fat containing inguinal hernias. Air within the urinary bladder, recommend correlation with recent instrumentation. Focally thickened gastric cardia, recommend endoscopy. Electronically Signed   By: Elon Alas M.D.   On: 07/03/2016 01:55   [place] Art/ven Flow Abd Pelv Doppler  Result Date: 07/03/2016 CLINICAL DATA:  75 year old male with left scrotal abscess. EXAM: SCROTAL ULTRASOUND DOPPLER ULTRASOUND OF THE TESTICLES TECHNIQUE: Complete ultrasound examination of the testicles, epididymis and other scrotal structures was performed. Color and spectral Doppler ultrasound were also utilized to evaluate blood flow to the testicles. COMPARISON:  None. Ultrasound dated 06/16/2016 and CT dated 07/03/2016 FINDINGS: Right testicle Measurements: 3.1 x 2.3 x 2.5 cm. No mass or microlithiasis visualized. Left testicle Measurements: 3.0 x 2.3 x 2.9 cm. The left testicle is heterogeneous. There is a 6.5 x 4.0 x 5.0 cm complex heterogeneous hypoechoic area in the inferior and lateral aspect of the left scrotal wall with surrounding hyperemia most compatible with and developing abscess. This corresponds to the  peripherally enhancing, low attenuating area in the inferior aspect of the left scrotal  wall seen on the today's CT. Right epididymis:  Normal in size and appearance. Left epididymis:  Normal in size and appearance. Hydrocele:  There is a small left hydrocele. Varicocele:  None visualized. Pulsed Doppler interrogation of both testes demonstrates normal low resistance arterial and venous waveforms bilaterally. IMPRESSION: Unremarkable testicles. Complex hypoechoic area with surrounding hyperemia in the inferior aspect of the left scrotal mole compatible with phlegmon or developing abscess. Small left hydrocele. Preserved testicular perfusion. Electronically Signed   By: Anner Crete M.D.   On: 07/03/2016 01:38    Assessment: Patient is a 75 y.o. male with multiple medical problems notably DM and CAD presenting with a scrotal wall abscess. He is s/p scrotal wall abscess I&D at bedside 07/03/16. Patient tolerated well. Cultures pending.  Recommendations: 1. Continue empiric antibiotics. Follow-up blood and wound cultures.  2. Daily wound packing with Iodoform gauze.  Premedication with oxycodone 30 min prior likely needed initially. Nursing to perform teaching with dressing changes, patient may need home health nursing depending. 3. Follow-up with [facility] (Johnae Friley) in approximately 10-14 days for wound check.   Will sign off. Thank you for this consult. Please do not hesitate to contact us with any further questions/concerns.  Lorayne Bender, MD PGY4 Urology Resident  I agree with the above assessment and plan.  I examined the patient, following incision and drainage.  I agree with follow-up in our office.

## 2016-07-05 ENCOUNTER — Other Ambulatory Visit: Payer: Self-pay | Admitting: Family Medicine

## 2016-07-05 DIAGNOSIS — E440 Moderate protein-calorie malnutrition: Secondary | ICD-10-CM | POA: Insufficient documentation

## 2016-07-05 LAB — CBC WITH DIFFERENTIAL/PLATELET
BASOS ABS: 0.1 10*3/uL (ref 0.0–0.1)
BASOS PCT: 0 %
Eosinophils Absolute: 0.3 10*3/uL (ref 0.0–0.7)
Eosinophils Relative: 2 %
HEMATOCRIT: 40.4 % (ref 39.0–52.0)
HEMOGLOBIN: 13.9 g/dL (ref 13.0–17.0)
Lymphocytes Relative: 14 %
Lymphs Abs: 2.7 10*3/uL (ref 0.7–4.0)
MCH: 29.6 pg (ref 26.0–34.0)
MCHC: 34.4 g/dL (ref 30.0–36.0)
MCV: 86 fL (ref 78.0–100.0)
MONOS PCT: 6 %
Monocytes Absolute: 1.1 10*3/uL — ABNORMAL HIGH (ref 0.1–1.0)
NEUTROS ABS: 14.9 10*3/uL — AB (ref 1.7–7.7)
NEUTROS PCT: 78 %
Platelets: 448 10*3/uL — ABNORMAL HIGH (ref 150–400)
RBC: 4.7 MIL/uL (ref 4.22–5.81)
RDW: 14.7 % (ref 11.5–15.5)
WBC: 19.1 10*3/uL — AB (ref 4.0–10.5)

## 2016-07-05 LAB — URINE CULTURE

## 2016-07-05 LAB — GLUCOSE, CAPILLARY
GLUCOSE-CAPILLARY: 128 mg/dL — AB (ref 65–99)
GLUCOSE-CAPILLARY: 154 mg/dL — AB (ref 65–99)
GLUCOSE-CAPILLARY: 155 mg/dL — AB (ref 65–99)
GLUCOSE-CAPILLARY: 186 mg/dL — AB (ref 65–99)
Glucose-Capillary: 160 mg/dL — ABNORMAL HIGH (ref 65–99)
Glucose-Capillary: 188 mg/dL — ABNORMAL HIGH (ref 65–99)
Glucose-Capillary: 170 mg/dL — ABNORMAL HIGH (ref 65–99)

## 2016-07-05 LAB — BASIC METABOLIC PANEL
ANION GAP: 11 (ref 5–15)
BUN: 15 mg/dL (ref 6–20)
CHLORIDE: 100 mmol/L — AB (ref 101–111)
CO2: 25 mmol/L (ref 22–32)
Calcium: 9.6 mg/dL (ref 8.9–10.3)
Creatinine, Ser: 0.77 mg/dL (ref 0.61–1.24)
GFR calc non Af Amer: >60 mL/min (ref >60)
Glucose, Bld: 190 mg/dL — ABNORMAL HIGH (ref 65–99)
POTASSIUM: 3.5 mmol/L (ref 3.5–5.1)
Sodium: 136 mmol/L (ref 135–145)

## 2016-07-05 LAB — MAGNESIUM: MAGNESIUM: 0.9 mg/dL — AB (ref 1.7–2.4)

## 2016-07-05 MED ORDER — ONDANSETRON HCL 4 MG/2ML IJ SOLN
4.0000 mg | Freq: Three times a day (TID) | INTRAMUSCULAR | Status: DC
  Administered 2016-07-05 – 2016-07-08 (×7): 4 mg via INTRAVENOUS
  Filled 2016-07-05 (×9): qty 2

## 2016-07-05 MED ORDER — ENSURE ENLIVE PO LIQD: 237.0000 mL | Freq: Two times a day (BID) | ORAL | Status: DC

## 2016-07-05 MED ORDER — MORPHINE SULFATE (PF) 2 MG/ML IV SOLN
1.0000 mg | Freq: Once | INTRAVENOUS | Status: AC
  Administered 2016-07-05: 1 mg via INTRAVENOUS
  Filled 2016-07-05: qty 1

## 2016-07-05 MED ORDER — ONDANSETRON HCL 4 MG/2ML IJ SOLN
4.0000 mg | Freq: Four times a day (QID) | INTRAMUSCULAR | Status: DC | PRN
  Administered 2016-07-05: 4 mg via INTRAVENOUS
  Filled 2016-07-05: qty 2

## 2016-07-05 MED ORDER — MAGNESIUM SULFATE 4 GM/100ML IV SOLN
4.0000 g | Freq: Once | INTRAVENOUS | Status: AC
  Administered 2016-07-05: 4 g via INTRAVENOUS
  Filled 2016-07-05: qty 100

## 2016-07-05 MED ORDER — SORBITOL 70 % SOLN
960.0000 mL | TOPICAL_OIL | Freq: Once | ORAL | Status: AC
  Administered 2016-07-05: 960 mL via RECTAL
  Filled 2016-07-05: qty 240

## 2016-07-05 NOTE — Progress Notes (Addendum)
PROGRESS NOTE    Dustin Dominguez  [id number] DOB: 1941-03-24 DOA: 07/02/2016 PCP: Redge Gainer, MD    Brief Narrative:  75 year old gentleman history of type 2 diabetes well controlled, chronic pain, coronary artery disease, hypertension who presented to the ED with worsening pain and swelling the scrotal area onset 3 weeks prior to admission that has been draining and ongoing and worsening symptoms 4-5 days prior to admission. Patient failed outpatient treatment per his PCP.  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 - Scrotal culture negative thus far. Patient afebrile - Continue dressing changes as recommended per urology.  - Urology has since signed off as of 07/04/2016 with recommendations for follow-up in 10-14 days for wound check. - Still with leukocytosis. Will check cbc in AM  #2 abdominal pain -Likely secondary to marked constipation -GI was consulted, appreciate input -Patient given trial smog enema with some results today. -Continue cathartics as tolerated to ensure good bowel movements  #3 constipation -Per above, continue cathartics as tolerated to ensure bowel movements  #4 well-controlled diabetes mellitus type 2 -Chart reviewed. Hemoglobin A1c was 5.5 on 09/10/2015.  -Glucose trends reviewed. Overall stable  #5 hypertension -Suboptimal overnight, stable -For now, will continue Norvasc, Lopressor, lisinopril,imdur.  #6 coronary artery disease -Remains stable at present without chest pain -Patient with a  prior history of CABG with last cath in April 2017 which showed severe 2 vessel disease but patent bypasses, no stents were placed.  -We'll continue Norvasc, Lopressor, lisinopril, imdur.  DVT prophylaxis: SCDs Code Status: Full code Family Communication: Patient in room, family not at bedside Disposition Plan: Possible discharge in 24 hours  Consultants:   Urology  Gastroenterology  Procedures:   Incision and drainage of scrotal wall abscess and Dr. Diona Fanti 07/03/2016  Antimicrobials: Anti-infectives    Start     Dose/Rate Route Frequency Ordered Stop   07/03/16 1200  vancomycin (VANCOCIN) IVPB 1000 mg/200 mL premix     1,000 mg 200 mL/hr over 60 Minutes Intravenous Every 12 hours 07/03/16 0305     07/03/16 1200  piperacillin-tazobactam (ZOSYN) IVPB 3.375 g  Status:  Discontinued     3.375 g 100 mL/hr over 30 Minutes Intravenous Every 6 hours 07/03/16 0951 07/03/16 0958   07/03/16 1200  piperacillin-tazobactam (ZOSYN) IVPB 3.375 g     3.375 g 12.5 mL/hr over 240 Minutes Intravenous Every 8 hours 07/03/16 1000     07/03/16 1100  ceFEPIme (MAXIPIME) 1 g in dextrose 5 % 50 mL IVPB  Status:  Discontinued     1 g 100 mL/hr over 30 Minutes Intravenous Every 12 hours 07/03/16 0305 07/03/16 0951   07/03/16 0015  vancomycin (VANCOCIN) IVPB 1000 mg/200 mL premix     1,000 mg 200 mL/hr over 60 Minutes Intravenous  Once 07/03/16 0001 07/03/16 0306   07/03/16 0015  ceFEPIme (MAXIPIME) 2 g in dextrose 5 % 50 mL IVPB     2 g 100 mL/hr over 30 Minutes Intravenous  Once 07/03/16 0002 07/03/16 0152       Subjective:  Complains of nausea and abdominal pain  Objective: Vitals:   07/04/16 0413 07/04/16 2042 07/05/16 0432 07/05/16 1430  BP: 130/60 (!) 144/72 (!) 168/88 (!) 168/77  Pulse: (!) 52 62 63 73  Resp: 16 17 19 18   Temp: 98.4 F (36.9 C) 98.4 F (36.9 C) 98.7 F (37.1 C) 98.4 F (36.9 C)  TempSrc: Oral Oral Oral Oral  SpO2: 97% 96% 100% 99%  Weight:      Height:         Intake/Output Summary (Last 24 hours) at 07/05/16 1858 Last data filed at 07/05/16 1433  Gross per 24 hour  Intake                0 ml  Output                0 ml  Net                0 ml   Filed Weights   07/02/16 2023 07/03/16 0540  Weight: 85.3 kg (188 lb) 87.4 kg (192 lb 10.9 oz)    Examination:  General exam: Appears calm and comfortable  Respiratory system: Clear to auscultation. Respiratory effort normal. Cardiovascular system: S1 & S2 heard, RRR Gastrointestinal system: Obese, distended, decreased bowel sounds, generally tender Central nervous system: Alert and oriented. No focal neurological deficits. Extremities: Symmetric 5 x 5 power. Skin: No rashes, lesions Psychiatry: Judgement and insight appear normal. Mood & affect appropriate.   Data Reviewed: I have personally reviewed following labs and imaging studies  CBC:  Recent Labs Lab 07/02/16 2102 07/04/16 0907 07/05/16 0554  WBC 13.7* 16.2* 19.1*  NEUTROABS 6.7 8.5* 14.9*  HGB 12.7* 12.0* 13.9  HCT 37.6* 34.9* 40.4  MCV 87.4 86.4 86.0  PLT 486* 403* [id number]*   Basic Metabolic Panel:  Recent Labs Lab 07/02/16 2102 07/04/16 0907 07/05/16 0554  NA 140 141 136  K 3.7 4.2 3.5  CL 104 105 100*  CO2 28 32 25  GLUCOSE 133* 101* 190*  BUN 17 18 15   CREATININE 0.94 1.15 0.77  CALCIUM 8.7* 8.6* 9.6  MG  --   --  0.9*   GFR: Estimated Creatinine Clearance: 87.6 mL/min (by C-G formula based on SCr of 0.77 mg/dL). Liver Function Tests:  Recent Labs Lab 07/02/16 2102  AST 19  ALT 10*  ALKPHOS 44  BILITOT 0.5  PROT 6.3*  ALBUMIN 2.9*   No results for input(s): LIPASE, AMYLASE in the last 168 hours. No results for input(s): AMMONIA in the last 168 hours. Coagulation Profile:  Recent Labs Lab 07/02/16 2102  INR 1.08   Cardiac Enzymes: No results for input(s): CKTOTAL, CKMB, CKMBINDEX, TROPONINI in the last 168 hours. BNP (last 3 results) No results for input(s): PROBNP in the last 8760  hours. HbA1C: No results for input(s): HGBA1C in the last 72 hours. CBG:  Recent Labs Lab 07/05/16 0016 07/05/16 0428 07/05/16 0726 07/05/16 1154 07/05/16 1650  GLUCAP 128* 160* 186* 188* 170*   Lipid Profile: No results for input(s): CHOL, HDL, LDLCALC, TRIG, CHOLHDL, LDLDIRECT in the last 72 hours. Thyroid Function Tests: No results for input(s): TSH, T4TOTAL, FREET4, T3FREE, THYROIDAB in the last 72 hours. Anemia Panel: No results for input(s): VITAMINB12, FOLATE, FERRITIN, TIBC, IRON, RETICCTPCT in the last 72 hours. Sepsis Labs:  Recent Labs Lab 07/02/16 2113 07/03/16 0039  LATICACIDVEN 1.31 1.07    Recent Results (from the past 240 hour(s))  Urine culture     Status: Abnormal  Collection Time: 07/02/16  9:33 PM  Result Value Ref Range Status   Specimen Description URINE, CLEAN CATCH  Final   Special Requests NONE  Final   Culture (A)  Final    60,000 COLONIES/mL STAPHYLOCOCCUS SPECIES (COAGULASE NEGATIVE)   Report Status 07/05/2016 FINAL  Final   Organism ID, Bacteria STAPHYLOCOCCUS SPECIES (COAGULASE NEGATIVE) (A)  Final      Susceptibility   Staphylococcus species (coagulase negative) - MIC*    CIPROFLOXACIN >=8 RESISTANT Resistant     GENTAMICIN <=0.5 SENSITIVE Sensitive     NITROFURANTOIN <=16 SENSITIVE Sensitive     OXACILLIN >=4 RESISTANT Resistant     TETRACYCLINE 2 SENSITIVE Sensitive     VANCOMYCIN 1 SENSITIVE Sensitive     TRIMETH/SULFA 80 RESISTANT Resistant     CLINDAMYCIN <=0.25 SENSITIVE Sensitive     RIFAMPIN <=0.5 SENSITIVE Sensitive     Inducible Clindamycin NEGATIVE Sensitive     * 60,000 COLONIES/mL STAPHYLOCOCCUS SPECIES (COAGULASE NEGATIVE)  Blood culture (routine x 2)     Status: None (Preliminary result)   Collection Time: 07/03/16 12:25 AM  Result Value Ref Range Status   Specimen Description BLOOD LEFT ARM  Final   Special Requests BOTTLES DRAWN AEROBIC AND ANAEROBIC 5CC  Final   Culture NO GROWTH 2 DAYS  Final   Report Status  PENDING  Incomplete  Blood culture (routine x 2)     Status: None (Preliminary result)   Collection Time: 07/03/16 12:31 AM  Result Value Ref Range Status   Specimen Description BLOOD RIGHT HAND  Final   Special Requests BOTTLES DRAWN AEROBIC AND ANAEROBIC 5CC  Final   Culture NO GROWTH 2 DAYS  Final   Report Status PENDING  Incomplete  Aerobic Culture (superficial specimen)     Status: None (Preliminary result)   Collection Time: 07/03/16 10:58 AM  Result Value Ref Range Status   Specimen Description ABSCESS SCROTUM  Final   Special Requests NORMAL  Final   Gram Stain   Final    RARE WBC PRESENT, PREDOMINANTLY PMN NO ORGANISMS SEEN    Culture   Final    NO GROWTH 2 DAYS Performed at [facility]    Report Status PENDING  Incomplete     Radiology Studies: No results found.  Scheduled Meds: . amLODipine  10 mg Oral Daily  . atorvastatin  20 mg Oral Daily  . cholecalciferol  1,000 Units Oral Daily  . feeding supplement (ENSURE ENLIVE)  237 mL Oral BID BM  . fenofibrate  160 mg Oral Daily  . Influenza vac split quadrivalent PF  0.5 mL Intramuscular Tomorrow-1000  . insulin aspart  0-9 Units Subcutaneous Q4H  . isosorbide mononitrate  30 mg Oral Daily  . lisinopril  20 mg Oral Daily  . metoprolol  25 mg Oral BID  . morphine  30 mg Oral TID  . omega-3 acid ethyl esters  1 g Oral Daily  . ondansetron (ZOFRAN) IV  4 mg Intravenous Q8H  . oxyCODONE-acetaminophen  1 tablet Oral 6 X Daily   And  . oxyCODONE  5 mg Oral 6 X Daily  . oxyCODONE-acetaminophen  1 tablet Oral Once  . pantoprazole (PROTONIX) IV  40 mg Intravenous Q12H  . piperacillin-tazobactam (ZOSYN)  IV  3.375 g Intravenous Q8H  . polyethylene glycol  17 g Oral BID  . senna-docusate  1 tablet Oral BID  . sucralfate  1 g Oral TID WC & HS  . vancomycin  1,000 mg Intravenous Q12H  Continuous Infusions:   LOS: 2 days   Nyzier Boivin, Orpah Melter, MD [facility] Pager [phone]  If 7PM-7AM, please  contact night-coverage [email or web address] Password [facility] 07/05/2016, 6:58 PM
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
## 2016-07-05 NOTE — Progress Notes (Signed)
SMOG enema administered around 11am. Medium hard stool resulted. Pt experienced minimal relief from nausea. He did tolerate taking BP medications and senna PO only.

## 2016-07-06 LAB — CBC
HCT: 40 % (ref 39.0–52.0)
HCT: 40.3 % (ref 39.0–52.0)
HEMOGLOBIN: 14.3 g/dL (ref 13.0–17.0)
Hemoglobin: 14.1 g/dL (ref 13.0–17.0)
MCH: 29.6 pg (ref 26.0–34.0)
MCH: 29.7 pg (ref 26.0–34.0)
MCHC: 35.3 g/dL (ref 30.0–36.0)
MCHC: 35.5 g/dL (ref 30.0–36.0)
MCV: 83.9 fL (ref 78.0–100.0)
MCV: 83.8 fL (ref 78.0–100.0)
PLATELETS: 404 10*3/uL — AB (ref 150–400)
PLATELETS: 431 10*3/uL — AB (ref 150–400)
RBC: 4.77 MIL/uL (ref 4.22–5.81)
RBC: 4.81 MIL/uL (ref 4.22–5.81)
RDW: 14.3 % (ref 11.5–15.5)
RDW: 14.2 % (ref 11.5–15.5)
WBC: 25.6 10*3/uL — ABNORMAL HIGH (ref 4.0–10.5)
WBC: 24.3 10*3/uL — AB (ref 4.0–10.5)

## 2016-07-06 LAB — GLUCOSE, CAPILLARY
GLUCOSE-CAPILLARY: 124 mg/dL — AB (ref 65–99)
GLUCOSE-CAPILLARY: 128 mg/dL — AB (ref 65–99)
GLUCOSE-CAPILLARY: 148 mg/dL — AB (ref 65–99)
Glucose-Capillary: 144 mg/dL — ABNORMAL HIGH (ref 65–99)
Glucose-Capillary: 142 mg/dL — ABNORMAL HIGH (ref 65–99)
Glucose-Capillary: 123 mg/dL — ABNORMAL HIGH (ref 65–99)

## 2016-07-06 LAB — BASIC METABOLIC PANEL
ANION GAP: 7 (ref 5–15)
BUN: 19 mg/dL (ref 6–20)
CHLORIDE: 102 mmol/L (ref 101–111)
CO2: 28 mmol/L (ref 22–32)
Calcium: 9.1 mg/dL (ref 8.9–10.3)
Creatinine, Ser: 0.84 mg/dL (ref 0.61–1.24)
GFR calc Af Amer: >60 mL/min (ref >60)
GLUCOSE: 141 mg/dL — AB (ref 65–99)
POTASSIUM: 4.1 mmol/L (ref 3.5–5.1)
SODIUM: 137 mmol/L (ref 135–145)

## 2016-07-06 LAB — VANCOMYCIN, TROUGH: VANCOMYCIN TR: 17 ug/mL (ref 15–20)

## 2016-07-06 LAB — MAGNESIUM: Magnesium: 1.9 mg/dL (ref 1.7–2.4)

## 2016-07-06 MED ORDER — BISACODYL 10 MG RE SUPP
10.0000 mg | Freq: Once | RECTAL | Status: AC
  Administered 2016-07-06: 10 mg via RECTAL
  Filled 2016-07-06: qty 1

## 2016-07-06 MED ORDER — PROMETHAZINE HCL 25 MG/ML IJ SOLN: 12.5000 mg | Freq: Four times a day (QID) | INTRAMUSCULAR | Status: DC | PRN

## 2016-07-06 MED ORDER — POLYETHYLENE GLYCOL 3350 17 G PO PACK
68.0000 g | PACK | Freq: Once | ORAL | Status: AC
  Administered 2016-07-06: 68 g via ORAL
  Filled 2016-07-06: qty 4

## 2016-07-06 MED ORDER — SORBITOL 70 % SOLN
960.0000 mL | TOPICAL_OIL | Freq: Once | ORAL | Status: AC
  Administered 2016-07-06: 960 mL via RECTAL
  Filled 2016-07-06: qty 240

## 2016-07-06 NOTE — Progress Notes (Signed)
Administered smog enema with little to no results. Notified MD

## 2016-07-06 NOTE — Progress Notes (Signed)
     [facility] Progress Note  Chief Complaint:    Nausea, vomiting, constipation   Subjective: Still constantly nauseated, vomiting x 1 last night. Abdominal pain doesn't seem to be main concern rather than the nausea. Passed some hard stool with enema, thinks one more is needed.    Objective:  Vital signs in last 24 hours: Temp:  [98.4 F (36.9 C)-98.6 F (37 C)] 98.5 F (36.9 C) (12/19 0407) Pulse Rate:  [73-77] 73 (12/19 0407) Resp:  [18] 18 (12/19 0407) BP: (162-176)/(77-86) 176/84 (12/19 0407) SpO2:  [97 %-99 %] 97 % (12/19 0407) Last BM Date: 07/05/16 General:   Alert, thin white male in NAD EENT:  Normal hearing, non icteric sclera, conjunctive pink.  Heart:  Regular rate and rhythm; no murmurs. no lower extremity edema Pulm: Normal respiratory effort, A few bibasilar crackles.  Abdomen:  Soft, nontender.  Normal bowel sounds, large right chronic bulge.     Neurologic:  Alert and  oriented x4;  grossly normal neurologically. Psych:  Alert and cooperative. Normal mood and affect.  Intake/Output from previous day: 12/18 0701 - 12/19 0700 In: 1100 [IV Piggyback:1100] Out: 275 [Urine:275] Intake/Output this shift: No intake/output data recorded.  Lab Results:  Recent Labs  07/04/16 0907 07/05/16 0554 07/06/16 0556  WBC 16.2* 19.1* 25.6*  HGB 12.0* 13.9 14.1  HCT 34.9* 40.4 40.0  PLT 403* 448* 404*   BMET  Recent Labs  07/04/16 0907 07/05/16 0554  NA 141 136  K 4.2 3.5  CL 105 100*  CO2 32 25  GLUCOSE 101* 190*  BUN 18 15  CREATININE 1.15 0.77  CALCIUM 8.6* 9.6    Assessment / Plan:  1. Persistent nausea, vomiting. Still feel this is multifactorial. Getting scheduled zofran.  Says he hasn't had any PO intake in 2-3 days. Vomited x 1 last night (brown emesis). Has DM2 but glucose levels not high enough to cause gastroparesis at this point.  -continue IV PP -Continue scheduled Zofran, add Phenergan as needed.  -eventual EGD,  probably as outpatient, to evaluated the focally thickened gastric cardia on imaging.  -check CBC and bmet today.   2. Chronic constipation, on chronic narcotics. Passed some hard stool with SMOG, wants another enema. Given the nauseas and vomiting I think we need to continue to work from below.  -continue miralax as tolerated, continue senokot -dulcolax supp -repeat smog enema  3. Scrotal wall abscess, s/p I&D. On IV antibiotics. Urology has signed off  4. Leukocytosis, 25K yesterday. All from #3? I ordered repeat CBC this am     LOS: 3 days   Tye Savoy NP 07/06/2016, 9:12 AM  Pager number [phone]     ________________________________________________________________________  Velora Heckler GI MD note:  I personally examined the patient, reviewed the data and agree with the assessment and plan described above.  He's moving his bowels but not enough volume for me to think he is really cleared out yet.  I'm going to give him some extra miralax this afternoon.    Owens Loffler, MD [facility] Pager [phone]

## 2016-07-06 NOTE — Progress Notes (Addendum)
PROGRESS NOTE    Dustin Dominguez  [id number] DOB: [birth year] DOA: 07/02/2016 PCP: Redge Gainer, MD    Brief Narrative:  75 year old gentleman history of type 2 diabetes well controlled, chronic pain, coronary artery disease, hypertension who presented to the ED with worsening pain and swelling the scrotal area onset 3 weeks prior to admission that has been draining and ongoing and worsening symptoms 4-5 days prior to admission. Patient failed outpatient treatment per his PCP.  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 - Scrotal culture negative thus far. Patient afebrile - Continue dressing changes as recommended per urology.  - Urology has since signed off as of 07/04/2016 with recommendations for follow-up in 10-14 days for wound check. - Leukocytosis has trended up today although overall clinically improved - Cont abx. Will repeat CBC in AM. Labs reviewed  #2 abdominal pain -Likely secondary to marked constipation -GI was consulted, appreciate input -Patient given trial smog enema with some results, still feeling constipated and requesting  -Give another trial of SMOG -Input by GI reviewed. Possible EGD, may be outpatient. Cont scheduled zofran  #3 constipation -Per above, continue cathartics as tolerated to ensure bowel movements -Give another trial of SMOG  #4 well-controlled diabetes mellitus type 2 -Chart reviewed. Hemoglobin A1c was 5.5 on 09/10/2015.  -Glucose remains stable, labs reviewed  #5 hypertension -BP  remains stable overnight -Patient continued on Norvasc, Lopressor, lisinopril,imdur.  #6 coronary artery disease -Remains stable at present without chest pain -Patient with a prior history of CABG with last cath in April 2017 which showed severe 2 vessel disease but patent bypasses, no stents were placed.  -Tolerating Norvasc, Lopressor, lisinopril, imdur.  DVT prophylaxis: SCDs Code Status: Full code Family Communication: Patient in room, family not at bedside Disposition Plan: Possible discharge in 24 hours  Consultants:   Urology  Gastroenterology  Procedures:   Incision and drainage of scrotal wall abscess and Dr. Diona Fanti 07/03/2016  Antimicrobials: Anti-infectives    Start     Dose/Rate Route Frequency Ordered Stop   07/03/16 1200  vancomycin (VANCOCIN) IVPB 1000 mg/200 mL premix     1,000 mg 200 mL/hr over 60 Minutes Intravenous Every 12 hours 07/03/16 0305     07/03/16 1200  piperacillin-tazobactam (ZOSYN) IVPB 3.375 g  Status:  Discontinued     3.375 g 100 mL/hr over 30 Minutes Intravenous Every 6 hours 07/03/16 0951 07/03/16 0958   07/03/16 1200  piperacillin-tazobactam (ZOSYN) IVPB 3.375 g     3.375 g 12.5 mL/hr over 240 Minutes Intravenous Every 8 hours 07/03/16 1000     07/03/16 1100  ceFEPIme (MAXIPIME) 1 g in dextrose 5 % 50 mL IVPB  Status:  Discontinued     1 g 100 mL/hr over 30 Minutes Intravenous Every 12 hours 07/03/16 0305 07/03/16 0951   07/03/16 0015  vancomycin (VANCOCIN) IVPB 1000 mg/200 mL premix     1,000 mg 200 mL/hr over 60 Minutes Intravenous  Once 07/03/16 0001 07/03/16 0306   07/03/16 0015  ceFEPIme (MAXIPIME) 2 g in dextrose 5 %  50 mL IVPB     2 g 100 mL/hr over 30 Minutes Intravenous  Once 07/03/16 0002 07/03/16 0152      Subjective: Feels better, however still feels consitpated  Objective: Vitals:   07/05/16 1430 07/05/16 2047 07/06/16 0407 07/06/16 1459  BP: (!) 168/77 (!) 162/86 (!) 176/84 (!) 145/67  Pulse: 73 77 73 67    Resp: 18 18 18 18   Temp: 98.4 F (36.9 C) 98.6 F (37 C) 98.5 F (36.9 C) 98.3 F (36.8 C)  TempSrc: Oral Oral Oral Oral  SpO2: 99% 97% 97% 98%  Weight:      Height:        Intake/Output Summary (Last 24 hours) at 07/06/16 1516 Last data filed at 07/06/16 0600  Gross per 24 hour  Intake             1100 ml  Output              275 ml  Net              825 ml   Filed Weights   07/02/16 2023 07/03/16 0540  Weight: 85.3 kg (188 lb) 87.4 kg (192 lb 10.9 oz)    Examination:  General exam: Laying in bed, in nad Respiratory system: normal resp effort, no audible wheezing Cardiovascular system: regular rate, s1, s2 Gastrointestinal system: soft, nondistended, pos BS Central nervous system: CN2-12 grossly intact, strength intact Extremities: Perfused, no clubbing. Skin: Normal skin turgor, no notable skin lesions seen Psychiatry: Mood normal, no visual hallucinations  Data Reviewed: I have personally reviewed following labs and imaging studies  CBC:  Recent Labs Lab 07/02/16 2102 07/04/16 0907 07/05/16 0554 07/06/16 0556 07/06/16 1030  WBC 13.7* 16.2* 19.1* 25.6* 24.3*  NEUTROABS 6.7 8.5* 14.9*  --   --   HGB 12.7* 12.0* 13.9 14.1 14.3  HCT 37.6* 34.9* 40.4 40.0 40.3  MCV 87.4 86.4 86.0 83.9 83.8  PLT 486* 403* 448* 404* [id number]*   Basic Metabolic Panel:  Recent Labs Lab 07/02/16 2102 07/04/16 0907 07/05/16 0554 07/06/16 1030  NA 140 141 136 137  K 3.7 4.2 3.5 4.1  CL 104 105 100* 102  CO2 28 32 25 28  GLUCOSE 133* 101* 190* 141*  BUN 17 18 15 19   CREATININE 0.94 1.15 0.77 0.84  CALCIUM 8.7* 8.6* 9.6 9.1  MG  --   --  0.9* 1.9   GFR: Estimated Creatinine Clearance: 83.4 mL/min (by C-G formula based on SCr of 0.84 mg/dL). Liver Function Tests:  Recent Labs Lab 07/02/16 2102  AST 19  ALT 10*  ALKPHOS 44  BILITOT 0.5  PROT 6.3*  ALBUMIN 2.9*   No results for input(s): LIPASE, AMYLASE in the last 168 hours. No results for input(s): AMMONIA in the  last 168 hours. Coagulation Profile:  Recent Labs Lab 07/02/16 2102  INR 1.08   Cardiac Enzymes: No results for input(s): CKTOTAL, CKMB, CKMBINDEX, TROPONINI in the last 168 hours. BNP (last 3 results) No results for input(s): PROBNP in the last 8760 hours. HbA1C: No results for input(s): HGBA1C in the last 72 hours. CBG:  Recent Labs Lab 07/05/16 2045 07/05/16 2359 07/06/16 0403 07/06/16 0733 07/06/16 1156  GLUCAP 155* 154* 144* 142* 124*   Lipid Profile: No results for input(s): CHOL, HDL, LDLCALC, TRIG, CHOLHDL, LDLDIRECT in the last 72 hours. Thyroid Function Tests: No results for input(s): TSH, T4TOTAL, FREET4, T3FREE, THYROIDAB in the last 72 hours. Anemia Panel: No results for input(s): VITAMINB12,  FOLATE, FERRITIN, TIBC, IRON, RETICCTPCT in the last 72 hours. Sepsis Labs:  Recent Labs Lab 07/02/16 2113 07/03/16 0039  LATICACIDVEN 1.31 1.07    Recent Results (from the past 240 hour(s))  Urine culture     Status: Abnormal   Collection Time: 07/02/16  9:33 PM  Result Value Ref Range Status   Specimen Description URINE, CLEAN CATCH  Final   Special Requests NONE  Final   Culture (A)  Final    60,000 COLONIES/mL STAPHYLOCOCCUS SPECIES (COAGULASE NEGATIVE)   Report Status 07/05/2016 FINAL  Final   Organism ID, Bacteria STAPHYLOCOCCUS SPECIES (COAGULASE NEGATIVE) (A)  Final      Susceptibility   Staphylococcus species (coagulase negative) - MIC*    CIPROFLOXACIN >=8 RESISTANT Resistant     GENTAMICIN <=0.5 SENSITIVE Sensitive     NITROFURANTOIN <=16 SENSITIVE Sensitive     OXACILLIN >=4 RESISTANT Resistant     TETRACYCLINE 2 SENSITIVE Sensitive     VANCOMYCIN 1 SENSITIVE Sensitive     TRIMETH/SULFA 80 RESISTANT Resistant     CLINDAMYCIN <=0.25 SENSITIVE Sensitive     RIFAMPIN <=0.5 SENSITIVE Sensitive     Inducible Clindamycin NEGATIVE Sensitive     * 60,000 COLONIES/mL STAPHYLOCOCCUS SPECIES (COAGULASE NEGATIVE)  Blood culture (routine x 2)     Status:  None (Preliminary result)   Collection Time: 07/03/16 12:25 AM  Result Value Ref Range Status   Specimen Description BLOOD LEFT ARM  Final   Special Requests BOTTLES DRAWN AEROBIC AND ANAEROBIC 5CC  Final   Culture NO GROWTH 3 DAYS  Final   Report Status PENDING  Incomplete  Blood culture (routine x 2)     Status: None (Preliminary result)   Collection Time: 07/03/16 12:31 AM  Result Value Ref Range Status   Specimen Description BLOOD RIGHT HAND  Final   Special Requests BOTTLES DRAWN AEROBIC AND ANAEROBIC 5CC  Final   Culture NO GROWTH 3 DAYS  Final   Report Status PENDING  Incomplete  Aerobic Culture (superficial specimen)     Status: None (Preliminary result)   Collection Time: 07/03/16 10:58 AM  Result Value Ref Range Status   Specimen Description ABSCESS SCROTUM  Final   Special Requests NORMAL  Final   Gram Stain   Final    RARE WBC PRESENT, PREDOMINANTLY PMN NO ORGANISMS SEEN Performed at [facility]  Final   Report Status PENDING  Incomplete     Radiology Studies: No results found.  Scheduled Meds: . amLODipine  10 mg Oral Daily  . atorvastatin  20 mg Oral Daily  . cholecalciferol  1,000 Units Oral Daily  . feeding supplement (ENSURE ENLIVE)  237 mL Oral BID BM  . fenofibrate  160 mg Oral Daily  . Influenza vac split quadrivalent PF  0.5 mL Intramuscular Tomorrow-1000  . insulin aspart  0-9 Units Subcutaneous Q4H  . isosorbide mononitrate  30 mg Oral Daily  . lisinopril  20 mg Oral Daily  . metoprolol  25 mg Oral BID  . morphine  30 mg Oral TID  . omega-3 acid ethyl esters  1 g Oral Daily  . ondansetron (ZOFRAN) IV  4 mg Intravenous Q8H  . oxyCODONE-acetaminophen  1 tablet Oral 6 X Daily   And  . oxyCODONE  5 mg Oral 6 X Daily  . oxyCODONE-acetaminophen  1 tablet Oral Once  . pantoprazole (PROTONIX) IV  40 mg Intravenous Q12H  . piperacillin-tazobactam (ZOSYN)  IV  3.375 g Intravenous Q8H  . polyethylene glycol  17  g Oral BID  . polyethylene glycol  68 g Oral Once  . senna-docusate  1 tablet Oral BID  . sucralfate  1 g Oral TID WC & HS  . vancomycin  1,000 mg Intravenous Q12H   Continuous Infusions:   LOS: 3 days   Cabe Lashley, Orpah Melter, MD [facility] Pager [phone]  If 7PM-7AM, please contact night-coverage [email or web address] Password [email or web address] 07/06/2016, 3:16 PM

## 2016-07-06 NOTE — Progress Notes (Addendum)
Pt was able to drink 4 8 ounce cups of water with miralax. He vomited within 5 minutes after a large amount of yellow emesis. Paged GI. Dr. Henrene Pastor stated to continue to monitor

## 2016-07-06 NOTE — Progress Notes (Signed)
Pt continues to be nauseous and unable to eat. He was able to hold down his scheduled PO medications.

## 2016-07-06 NOTE — Progress Notes (Signed)
Pharmacy Antibiotic Note  Dustin Dominguez is a 75 y.o. male admitted on 07/02/2016 with scrotal wall abscess, s/p I&D on 07/03/2016. Pharmacy has been consulted for Vancomycin dosing. Patient also placed on Zosyn per MD.   Plan: -Vancomycin trough level this AM = 17 mcg/mL, drawn early (~ 10 hour level). Expect true trough to be within goal 10-15 mcg/mL range, so will continue same regimen of Vancomycin 1g IV q12h at this time.  -Continue Zosyn 3.375g IV q8h (infuse over 4 hours)  -F/u micro data, renal function, and clinical course.    Height: 5\' 11"  (180.3 cm) Weight: 192 lb 10.9 oz (87.4 kg) IBW/kg (Calculated) : 75.3  Temp (24hrs), Avg:98.5 F (36.9 C), Min:98.4 F (36.9 C), Max:98.6 F (37 C)   Recent Labs Lab 07/02/16 2102 07/02/16 2113 07/03/16 0039 07/04/16 0907 07/05/16 0554 07/06/16 0556 07/06/16 1030  WBC 13.7*  --   --  16.2* 19.1* 25.6* 24.3*  CREATININE 0.94  --   --  1.15 0.77  --  0.84  LATICACIDVEN  --  1.31 1.07  --   --   --   --   VANCOTROUGH  --   --   --   --   --   --  17    Estimated Creatinine Clearance: 83.4 mL/min (by C-G formula based on SCr of 0.84 mg/dL).    Allergies  Allergen Reactions  . Amitriptyline Other (See Comments)    sleepy  . Bextra [Valdecoxib] Other (See Comments)    unknown  . Codeine Nausea And Vomiting  . Cymbalta [Duloxetine Hcl] Swelling and Other (See Comments)    dizzy  . Esomeprazole Magnesium Diarrhea  . Niacin-Lovastatin Er Other (See Comments)    headache  . Niaspan [Niacin Er] Other (See Comments)    Increased headache  . Penicillins Rash    Has patient had a PCN reaction causing immediate rash, facial/tongue/throat swelling, SOB or lightheadedness with hypotension: Yes Has patient had a PCN reaction causing severe rash involving mucus membranes or skin necrosis: No Has patient had a PCN reaction that required hospitalization No Has patient had a PCN reaction occurring within the last 10 years: No If all  of the above answers are "NO", then may proceed with Cephalosporin use.     Antimicrobials this admission: 12/16 Cefepime x 1 12/16 Vanc >>  12/16 Zosyn >>   Dose adjustments this admission: 12/19 Vancomycin trough level at 1030 = 17 mcg/mL on Vancomycin 1g q12h (drawn ~ 10h from last dose), no change  Microbiology results: 12/16 BCx x 2: NGTD 12/16 UCx:  60K colonies/mL CoNS-S to clinda, gent, NTF, rifampin, tetracycline, vanc  12/16 Scrotal abscess cx (aerobic superficial): rare gram negative rods  Thank you for allowing pharmacy to be a part of this patient's care.   Lindell Spar, PharmD, BCPS Pager: [phone] 07/06/2016 11:52 AM

## 2016-07-07 ENCOUNTER — Inpatient Hospital Stay (HOSPITAL_COMMUNITY): Payer: Medicare Other

## 2016-07-07 LAB — CBC
HEMATOCRIT: 41.5 % (ref 39.0–52.0)
Hemoglobin: 14.2 g/dL (ref 13.0–17.0)
MCH: 29 pg (ref 26.0–34.0)
MCHC: 34.2 g/dL (ref 30.0–36.0)
MCV: 84.9 fL (ref 78.0–100.0)
PLATELETS: 426 10*3/uL — AB (ref 150–400)
RBC: 4.89 MIL/uL (ref 4.22–5.81)
RDW: 14.3 % (ref 11.5–15.5)
WBC: 24.7 10*3/uL — AB (ref 4.0–10.5)

## 2016-07-07 LAB — GLUCOSE, CAPILLARY
GLUCOSE-CAPILLARY: 109 mg/dL — AB (ref 65–99)
Glucose-Capillary: 138 mg/dL — ABNORMAL HIGH (ref 65–99)
Glucose-Capillary: 122 mg/dL — ABNORMAL HIGH (ref 65–99)
Glucose-Capillary: 163 mg/dL — ABNORMAL HIGH (ref 65–99)
Glucose-Capillary: 149 mg/dL — ABNORMAL HIGH (ref 65–99)

## 2016-07-07 MED ORDER — LORAZEPAM 2 MG/ML IJ SOLN
0.5000 mg | INTRAMUSCULAR | Status: DC | PRN
  Administered 2016-07-09 – 2016-07-12 (×2): 0.5 mg via INTRAVENOUS
  Filled 2016-07-07: qty 1

## 2016-07-07 MED ORDER — DIATRIZOATE MEGLUMINE & SODIUM 66-10 % PO SOLN
90.0000 mL | Freq: Once | ORAL | Status: AC
  Administered 2016-07-07: 90 mL via NASOGASTRIC
  Filled 2016-07-07: qty 90

## 2016-07-07 MED ORDER — KCL IN DEXTROSE-NACL 20-5-0.45 MEQ/L-%-% IV SOLN
INTRAVENOUS | Status: DC
  Administered 2016-07-07 – 2016-07-10 (×3): via INTRAVENOUS
  Filled 2016-07-07 (×7): qty 1000

## 2016-07-07 MED ORDER — IOPAMIDOL (ISOVUE-300) INJECTION 61%
INTRAVENOUS | Status: AC
  Administered 2016-07-07: 100 mL
  Filled 2016-07-07: qty 100

## 2016-07-07 MED ORDER — MORPHINE SULFATE (PF) 2 MG/ML IV SOLN
2.0000 mg | INTRAVENOUS | Status: DC | PRN
Start: 2016-07-07 — End: 2016-07-15
  Administered 2016-07-07 – 2016-07-15 (×16): 2 mg via INTRAVENOUS
  Filled 2016-07-07 (×17): qty 1

## 2016-07-07 MED ORDER — ENALAPRILAT 1.25 MG/ML IV SOLN
0.6250 mg | Freq: Four times a day (QID) | INTRAVENOUS | Status: DC
  Administered 2016-07-07 – 2016-07-09 (×7): 0.625 mg via INTRAVENOUS
  Filled 2016-07-07 (×9): qty 0.5

## 2016-07-07 MED ORDER — METOPROLOL TARTRATE 5 MG/5ML IV SOLN
5.0000 mg | Freq: Four times a day (QID) | INTRAVENOUS | Status: DC
  Administered 2016-07-07 – 2016-07-14 (×28): 5 mg via INTRAVENOUS
  Filled 2016-07-07 (×28): qty 5

## 2016-07-07 MED ORDER — HYDRALAZINE HCL 20 MG/ML IJ SOLN
10.0000 mg | INTRAMUSCULAR | Status: DC | PRN
  Administered 2016-07-11: 10 mg via INTRAVENOUS
  Filled 2016-07-07: qty 1

## 2016-07-07 NOTE — Care Management Important Message (Signed)
Important Message  Patient Details  Name: LUIE LEANG MRN: [id number] Date of Birth: 1940-09-26   Medicare Important Message Given:  Yes    Kerin Salen 07/07/2016, 12:08 [place] Message  Patient Details  Name: CHUCK ALAMILLO MRN: [id number] Date of Birth: 1940/10/12   Medicare Important Message Given:  Yes    Kerin Salen 07/07/2016, 12:05 PM

## 2016-07-07 NOTE — Progress Notes (Signed)
NGT placed to LWIS. RN met resistance when attempting to place in left nare. No difficulties in placing NGT to right nare. 3300cc of bile immediately retrieved from the NGT and patient reported feeling "much better" after tube was placed. Patient tolerated procedure well. NG to [facility] and will continue to monitor.

## 2016-07-07 NOTE — Progress Notes (Signed)
Pt refused PO meds due to nausea. Dr. Wyline Copas notified.
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
## 2016-07-07 NOTE — Progress Notes (Signed)
[facility] Progress Note    Since last GI note: *very little stool output with smog enemas, really unable to keep anything down orally (even miralax last night).  Also worsening pain right side abd  Objective: Vital signs in last 24 hours: Temp:  [97.8 F (36.6 C)-98.3 F (36.8 C)] 97.8 F (36.6 C) (12/19 2040) Pulse Rate:  [67-81] 81 (12/19 2040) Resp:  [18] 18 (12/19 2040) BP: (145-163)/(67-79) 163/79 (12/19 2040) SpO2:  [98 %-99 %] 99 % (12/19 2040) Last BM Date: 07/05/16 General: alert and oriented times 3 Heart: regular rate and rythm Abdomen: soft overall but midlly tense right hernia site, trace bowel sounds   Lab Results:  Recent Labs  07/06/16 0556 07/06/16 1030 07/07/16 0613  WBC 25.6* 24.3* 24.7*  HGB 14.1 14.3 14.2  PLT 404* 431* 426*  MCV 83.9 83.8 84.9    Recent Labs  07/04/16 0907 07/05/16 0554 07/06/16 1030  NA 141 136 137  K 4.2 3.5 4.1  CL 105 100* 102  CO2 32 25 28  GLUCOSE 101* 190* 141*  BUN 18 15 19   CREATININE 1.15 0.77 0.84  CALCIUM 8.6* 9.6 9.1    Medications: Scheduled Meds: . amLODipine  10 mg Oral Daily  . atorvastatin  20 mg Oral Daily  . cholecalciferol  1,000 Units Oral Daily  . feeding supplement (ENSURE ENLIVE)  237 mL Oral BID BM  . fenofibrate  160 mg Oral Daily  . Influenza vac split quadrivalent PF  0.5 mL Intramuscular Tomorrow-1000  . insulin aspart  0-9 Units Subcutaneous Q4H  . isosorbide mononitrate  30 mg Oral Daily  . lisinopril  20 mg Oral Daily  . metoprolol  25 mg Oral BID  . morphine  30 mg Oral TID  . omega-3 acid ethyl esters  1 g Oral Daily  . ondansetron (ZOFRAN) IV  4 mg Intravenous Q8H  . oxyCODONE-acetaminophen  1 tablet Oral 6 X Daily   And  . oxyCODONE  5 mg Oral 6 X Daily  . pantoprazole (PROTONIX) IV  40 mg Intravenous Q12H  . piperacillin-tazobactam (ZOSYN)  IV  3.375 g Intravenous Q8H  . polyethylene glycol  17 g Oral BID  . senna-docusate  1 tablet Oral BID  .  sucralfate  1 g Oral TID WC & HS  . vancomycin  1,000 mg Intravenous Q12H   Continuous Infusions: PRN Meds:.diazepam, gi cocktail, oxyCODONE, promethazine, senna-docusate    Assessment/Plan: 75 y.o. male with scrotal abscess, nausea vomiting  His GI symptoms started AFTER he was admitted and I have been most suspicious that the nasuea, vomiting were related to the scrotal abscess, treatment for such, and possibly chronic constipation.  I'm increasingly concerned about a separate GI process, perhaps partal bowel obstruction.  Will order repeat CT abd/pelvis today with iv contrast.  Will make him NPO for now and I'm starting IV fluids.   If CT is not helpful, he will likely need egd    Milus Banister, MD  07/07/2016, 8:47 AM [facility] Pager [phone]

## 2016-07-07 NOTE — Progress Notes (Signed)
PROGRESS NOTE    Dustin Dominguez  [id number] DOB: March 12, 1941 DOA: 07/02/2016 PCP: Redge Gainer, MD    Brief Narrative:  75 year old gentleman history of type 2 diabetes well controlled, chronic pain, coronary artery disease, hypertension who presented to the ED with worsening pain and swelling the scrotal area onset 3 weeks prior to admission that has been draining and ongoing and worsening symptoms 4-5 days prior to admission. Patient failed outpatient treatment per his PCP.  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 - Scrotal culture negative thus far. Patient afebrile - Continue dressing changes as recommended per urology.  - Urology has since signed off as of 07/04/2016 with recommendations for follow-up in 10-14 days for wound check. - Leukocytosis has trended up today although overall clinically improved - Cont abx. Will repeat CBC in AM. Labs reviewed  #2 abdominal pain secondary to SBO -GI was consulted and is following, appreciate input -Patient recently given trial smog enema with some results, still feeling constipated and requesting  -Reviewed CT scan. Findings of SBO with transition zone. Per GI, may need EGD -NG tube placed with notable amounts of gastric contents removed  #3 constipation -Per above, cathartics were given  #4 well-controlled diabetes mellitus type 2 -Chart reviewed. Hemoglobin A1c was 5.5 on 09/10/2015.  -glucose reviewed. stable  #5 hypertension -BP remains stable overnight -Patient had been  continued on Norvasc, Lopressor, lisinopril,imdur. -Given SBO, will d/c PO meds and transition to IV ACEI and IV beta blocker -when able to take PO again, would then resume home meds  #6 coronary artery disease -Remains stable at present without chest pain -Patient with a prior history of CABG with last cath in April 2017 which showed severe 2 vessel disease but patent bypasses, no stents were placed.  -stable at present  DVT prophylaxis: SCDs Code Status: Full code Family Communication: Patient in room, family not at bedside Disposition Plan: Uncertain at this time  Consultants:   Urology  Gastroenterology  Procedures:   Incision and drainage of scrotal wall abscess and Dr. Diona Fanti 07/03/2016  Antimicrobials: Anti-infectives    Start     Dose/Rate Route Frequency Ordered Stop   07/03/16 1200  vancomycin (VANCOCIN) IVPB 1000 mg/200 mL premix  Status:  Discontinued     1,000 mg 200 mL/hr over 60 Minutes Intravenous Every 12 hours 07/03/16 0305 07/07/16 1636   07/03/16 1200  piperacillin-tazobactam (ZOSYN) IVPB 3.375 g  Status:  Discontinued     3.375 g 100 mL/hr over 30 Minutes Intravenous Every 6 hours 07/03/16 0951 07/03/16 0958   07/03/16 1200  piperacillin-tazobactam (ZOSYN) IVPB 3.375 g     3.375 g 12.5 mL/hr over 240 Minutes Intravenous Every 8 hours 07/03/16 1000     07/03/16 1100  ceFEPIme (MAXIPIME) 1 g in dextrose 5 % 50 mL IVPB  Status:  Discontinued     1 g 100 mL/hr over 30 Minutes Intravenous Every 12 hours 07/03/16 0305 07/03/16 0951   07/03/16 0015  vancomycin (VANCOCIN) IVPB 1000 mg/200 mL premix     1,000  mg 200 mL/hr over 60 Minutes Intravenous  Once 07/03/16 0001 07/03/16 0306   07/03/16 0015  ceFEPIme (MAXIPIME) 2 g in dextrose 5 % 50 mL IVPB     2 g 100 mL/hr over 30 Minutes Intravenous  Once 07/03/16 0002 07/03/16 0152      Subjective: Complaining of abd pain and distension  Objective: Vitals:   07/06/16 0407 07/06/16 1459 07/06/16 2040  07/07/16 1522  BP: (!) 176/84 (!) 145/67 (!) 163/79 (!) 151/85  Pulse: 73 67 81 84  Resp: 18 18 18 20   Temp: 98.5 F (36.9 C) 98.3 F (36.8 C) 97.8 F (36.6 C) 98.2 F (36.8 C)  TempSrc: Oral Oral Oral Oral  SpO2: 97% 98% 99% 99%  Weight:      Height:        Intake/Output Summary (Last 24 hours) at 07/07/16 1827 Last data filed at 07/07/16 1800  Gross per 24 hour  Intake              655 ml  Output             3705 ml  Net            -3050 ml   Filed Weights   07/02/16 2023 07/03/16 0540  Weight: 85.3 kg (188 lb) 87.4 kg (192 lb 10.9 oz)    Examination:  General exam: awake, conversant, in nad Respiratory system: normal chest rise, no crackles Cardiovascular system: regular rhythm, s1 and s1 auscultated Gastrointestinal system: distended, decreased BS, generally tender Central nervous system: no tremors, no seizures Extremities: no cyanosis, no joint deformities Skin: no pallor, no rashes Psychiatry: affect normal, no auditory hallucinations  Data Reviewed: I have personally reviewed following labs and imaging studies  CBC:  Recent Labs Lab 07/02/16 2102 07/04/16 0907 07/05/16 0554 07/06/16 0556 07/06/16 1030 07/07/16 0613  WBC 13.7* 16.2* 19.1* 25.6* 24.3* 24.7*  NEUTROABS 6.7 8.5* 14.9*  --   --   --   HGB 12.7* 12.0* 13.9 14.1 14.3 14.2  HCT 37.6* 34.9* 40.4 40.0 40.3 41.5  MCV 87.4 86.4 86.0 83.9 83.8 84.9  PLT 486* 403* 448* 404* 431* [id number]*   Basic Metabolic Panel:  Recent Labs Lab 07/02/16 2102 07/04/16 0907 07/05/16 0554 07/06/16 1030  NA 140 141 136 137  K 3.7 4.2 3.5 4.1  CL 104 105 100* 102  CO2 28 32 25 28  GLUCOSE 133* 101* 190* 141*  BUN 17 18 15 19   CREATININE 0.94 1.15 0.77 0.84  CALCIUM 8.7* 8.6* 9.6 9.1  MG  --   --  0.9* 1.9   GFR: Estimated Creatinine Clearance: 83.4 mL/min (by C-G formula based on SCr of 0.84 mg/dL). Liver Function Tests:  Recent Labs Lab 07/02/16 2102  AST 19  ALT 10*  ALKPHOS 44  BILITOT 0.5    PROT 6.3*  ALBUMIN 2.9*   No results for input(s): LIPASE, AMYLASE in the last 168 hours. No results for input(s): AMMONIA in the last 168 hours. Coagulation Profile:  Recent Labs Lab 07/02/16 2102  INR 1.08   Cardiac Enzymes: No results for input(s): CKTOTAL, CKMB, CKMBINDEX, TROPONINI in the last 168 hours. BNP (last 3 results) No results for input(s): PROBNP in the last 8760 hours. HbA1C: No results for input(s): HGBA1C in the last 72 hours. CBG:  Recent Labs Lab 07/06/16 2358 07/07/16 0417 07/07/16 0738 07/07/16 1310 07/07/16 1643  GLUCAP 128* 138* 122* 163* 149*   Lipid Profile: No results for input(s): CHOL, HDL, LDLCALC,  TRIG, CHOLHDL, LDLDIRECT in the last 72 hours. Thyroid Function Tests: No results for input(s): TSH, T4TOTAL, FREET4, T3FREE, THYROIDAB in the last 72 hours. Anemia Panel: No results for input(s): VITAMINB12, FOLATE, FERRITIN, TIBC, IRON, RETICCTPCT in the last 72 hours. Sepsis Labs:  Recent Labs Lab 07/02/16 2113 07/03/16 0039  LATICACIDVEN 1.31 1.07    Recent Results (from the past 240 hour(s))  Urine culture     Status: Abnormal   Collection Time: 07/02/16  9:33 PM  Result Value Ref Range Status   Specimen Description URINE, CLEAN CATCH  Final   Special Requests NONE  Final   Culture (A)  Final    60,000 COLONIES/mL STAPHYLOCOCCUS SPECIES (COAGULASE NEGATIVE)   Report Status 07/05/2016 FINAL  Final   Organism ID, Bacteria STAPHYLOCOCCUS SPECIES (COAGULASE NEGATIVE) (A)  Final      Susceptibility   Staphylococcus species (coagulase negative) - MIC*    CIPROFLOXACIN >=8 RESISTANT Resistant     GENTAMICIN <=0.5 SENSITIVE Sensitive     NITROFURANTOIN <=16 SENSITIVE Sensitive     OXACILLIN >=4 RESISTANT Resistant     TETRACYCLINE 2 SENSITIVE Sensitive     VANCOMYCIN 1 SENSITIVE Sensitive     TRIMETH/SULFA 80 RESISTANT Resistant     CLINDAMYCIN <=0.25 SENSITIVE Sensitive     RIFAMPIN <=0.5 SENSITIVE Sensitive     Inducible  Clindamycin NEGATIVE Sensitive     * 60,000 COLONIES/mL STAPHYLOCOCCUS SPECIES (COAGULASE NEGATIVE)  Blood culture (routine x 2)     Status: None (Preliminary result)   Collection Time: 07/03/16 12:25 AM  Result Value Ref Range Status   Specimen Description BLOOD LEFT ARM  Final   Special Requests BOTTLES DRAWN AEROBIC AND ANAEROBIC 5CC  Final   Culture NO GROWTH 4 DAYS  Final   Report Status PENDING  Incomplete  Blood culture (routine x 2)     Status: None (Preliminary result)   Collection Time: 07/03/16 12:31 AM  Result Value Ref Range Status   Specimen Description BLOOD RIGHT HAND  Final   Special Requests BOTTLES DRAWN AEROBIC AND ANAEROBIC 5CC  Final   Culture NO GROWTH 4 DAYS  Final   Report Status PENDING  Incomplete  Aerobic Culture (superficial specimen)     Status: None (Preliminary result)   Collection Time: 07/03/16 10:58 AM  Result Value Ref Range Status   Specimen Description ABSCESS SCROTUM  Final   Special Requests NORMAL  Final   Gram Stain   Final    RARE WBC PRESENT, PREDOMINANTLY PMN NO ORGANISMS SEEN    Culture   Final    RARE KLEBSIELLA PNEUMONIAE SUSCEPTIBILITIES TO FOLLOW Performed at [facility]    Report Status PENDING  Incomplete     Radiology Studies: Ct Abdomen Pelvis W Contrast  Result Date: 07/07/2016 CLINICAL DATA:  Right abdominal pain, nausea/vomiting EXAM: CT ABDOMEN AND PELVIS WITH CONTRAST TECHNIQUE: Multidetector CT imaging of the abdomen and pelvis was performed using the standard protocol following bolus administration of intravenous contrast. CONTRAST:  161mL ISOVUE-300 IOPAMIDOL (ISOVUE-300) INJECTION 61% COMPARISON:  07/03/2016 FINDINGS: Lower chest:  Linear scarring/atelectasis in the right lung base. Postsurgical changes related to prior CABG. Hepatobiliary: Stable linear radiodensities along the right liver (series 2/ image 19). Additional scattered calcified granulomata. Status post cholecystectomy. No intrahepatic or  extrahepatic ductal dilatation. Pancreas: Within normal limits. Spleen: Surgically absent. Adrenals/Urinary Tract: Adrenal glands are within normal limits. 4.5 cm cyst along the left upper kidney (series 2/ image 36). Right kidney is within normal limits. No hydronephrosis.  Nondependent gas within the bladder (series 2/ image 81), correlate for recent instrumentation. Stomach/Bowel: Stomach is moderately distended. Multiple dilated loops of small bowel in the central abdomen, suggesting small bowel obstruction. Focal transition along the right anterior mid abdomen (series 2/image 34; coronal image 28). Colon is relatively decompressed. Vascular/Lymphatic: No evidence of abdominal aortic aneurysm. Atherosclerotic calcifications of the abdominal aorta and branch vessels. No suspicious abdominopelvic lymphadenopathy. Reproductive: Prostate is unremarkable. Other: No abdominopelvic ascites. Fat within the bilateral inguinal canals. Musculoskeletal: Mild degenerative changes the lumbar spine. Old/healed right posterior rib fractures. Median sternotomy. IMPRESSION: Multiple loops of small bowel in the central abdomen, with focal transition point along the right anterior mid abdomen, suggesting small bowel obstruction. These results were called by telephone at the time of interpretation on 07/07/2016 at 4:30 pm to Dr. Owens Loffler , who verbally acknowledged these results. Electronically Signed   By: Julian Hy M.D.   On: 07/07/2016 16:36    Scheduled Meds: . amLODipine  10 mg Oral Daily  . atorvastatin  20 mg Oral Daily  . cholecalciferol  1,000 Units Oral Daily  . fenofibrate  160 mg Oral Daily  . Influenza vac split quadrivalent PF  0.5 mL Intramuscular Tomorrow-1000  . insulin aspart  0-9 Units Subcutaneous Q4H  . isosorbide mononitrate  30 mg Oral Daily  . lisinopril  20 mg Oral Daily  . metoprolol  25 mg Oral BID  . morphine  30 mg Oral TID  . omega-3 acid ethyl esters  1 g Oral Daily  .  ondansetron (ZOFRAN) IV  4 mg Intravenous Q8H  . oxyCODONE-acetaminophen  1 tablet Oral 6 X Daily   And  . oxyCODONE  5 mg Oral 6 X Daily  . pantoprazole (PROTONIX) IV  40 mg Intravenous Q12H  . piperacillin-tazobactam (ZOSYN)  IV  3.375 g Intravenous Q8H  . polyethylene glycol  17 g Oral BID  . senna-docusate  1 tablet Oral BID  . sucralfate  1 g Oral TID WC & HS   Continuous Infusions: . dextrose 5 % and 0.45 % NaCl with KCl 20 mEq/L 75 mL/hr at 07/07/16 1036     LOS: 4 days   Gabriele Loveland, Orpah Melter, MD [facility] Pager [phone]  If 7PM-7AM, please contact night-coverage [email or web address] Password [facility] 07/07/2016, 6:27 PM

## 2016-07-08 ENCOUNTER — Inpatient Hospital Stay (HOSPITAL_COMMUNITY): Payer: Medicare Other

## 2016-07-08 LAB — GLUCOSE, CAPILLARY
GLUCOSE-CAPILLARY: 133 mg/dL — AB (ref 65–99)
GLUCOSE-CAPILLARY: 121 mg/dL — AB (ref 65–99)
GLUCOSE-CAPILLARY: 141 mg/dL — AB (ref 65–99)
Glucose-Capillary: 117 mg/dL — ABNORMAL HIGH (ref 65–99)
Glucose-Capillary: 119 mg/dL — ABNORMAL HIGH (ref 65–99)
Glucose-Capillary: 151 mg/dL — ABNORMAL HIGH (ref 65–99)

## 2016-07-08 LAB — CBC
HEMATOCRIT: 42.5 % (ref 39.0–52.0)
Hemoglobin: 14.6 g/dL (ref 13.0–17.0)
MCH: 29.4 pg (ref 26.0–34.0)
MCHC: 34.4 g/dL (ref 30.0–36.0)
MCV: 85.7 fL (ref 78.0–100.0)
PLATELETS: 428 10*3/uL — AB (ref 150–400)
RBC: 4.96 MIL/uL (ref 4.22–5.81)
RDW: 14.5 % (ref 11.5–15.5)
WBC: 20.8 10*3/uL — AB (ref 4.0–10.5)

## 2016-07-08 LAB — BASIC METABOLIC PANEL
Anion gap: 10 (ref 5–15)
BUN: 28 mg/dL — ABNORMAL HIGH (ref 6–20)
CHLORIDE: 96 mmol/L — AB (ref 101–111)
CO2: 29 mmol/L (ref 22–32)
Calcium: 9.3 mg/dL (ref 8.9–10.3)
Creatinine, Ser: 1.2 mg/dL (ref 0.61–1.24)
GFR, EST NON AFRICAN AMERICAN: 58 mL/min — AB (ref >60)
Glucose, Bld: 144 mg/dL — ABNORMAL HIGH (ref 65–99)
POTASSIUM: 3.5 mmol/L (ref 3.5–5.1)
SODIUM: 135 mmol/L (ref 135–145)

## 2016-07-08 LAB — CULTURE, BLOOD (ROUTINE X 2)
CULTURE: NO GROWTH
Culture: NO GROWTH

## 2016-07-08 LAB — AEROBIC CULTURE W GRAM STAIN (SUPERFICIAL SPECIMEN): Special Requests: NORMAL

## 2016-07-08 LAB — AEROBIC CULTURE  (SUPERFICIAL SPECIMEN)

## 2016-07-08 MED ORDER — CEFAZOLIN SODIUM-DEXTROSE 2-4 GM/100ML-% IV SOLN
2.0000 g | Freq: Three times a day (TID) | INTRAVENOUS | Status: DC
  Administered 2016-07-08 – 2016-07-14 (×19): 2 g via INTRAVENOUS
  Filled 2016-07-08 (×20): qty 100

## 2016-07-08 MED ORDER — ONDANSETRON HCL 4 MG/2ML IJ SOLN: 4.0000 mg | Freq: Three times a day (TID) | INTRAMUSCULAR | Status: DC | PRN

## 2016-07-08 NOTE — Care Management Note (Addendum)
Case Management Note  Patient Details  Name: Dustin Dominguez MRN: [id number] Date of Birth: 10-27-1940  Subjective/Objective:   S/p I/D Scrotal Abscess 07/03/16 now with resolving SBO after NGT placed 07/08/2016 and immediate drain of 6L via NGT.                  Action/Plan:CM will follow closely for disposition/discharge needs.    Expected Discharge Date:                  Expected Discharge Plan:     In-House Referral:     Discharge planning Services     Post Acute Care Choice:    Choice offered to:     DME Arranged:    DME Agency:     HH Arranged:    [place] Agency:     Status of Service:     If discussed at [place] of Stay Meetings, dates discussed:  07/09/2016  Additional Comments:  Delrae Sawyers, RN 07/08/2016, 10:56 AM

## 2016-07-08 NOTE — Progress Notes (Signed)
Patient ID: Dustin Dominguez, male   DOB: 08-Jan-1941, 75 y.o.   MRN: [id number]  [facility] Surgery Progress Note     Subjective: Feeling a little better this morning. He did have some nausea over night but no emesis. NGT in place. Abdominal distension is less. Denies flatus or BM.  Objective: Vital signs in last 24 hours: Temp:  [97.7 F (36.5 C)-98.2 F (36.8 C)] 97.9 F (36.6 C) (12/21 0418) Pulse Rate:  [68-84] 68 (12/21 0418) Resp:  [18-20] 18 (12/21 0418) BP: (151-157)/(72-85) 157/81 (12/21 0418) SpO2:  [99 %-100 %] 99 % (12/21 0418) Last BM Date: 07/06/16  Intake/Output from previous day: 12/20 0701 - 12/21 0700 In: 1655 [P.O.:50; I.V.:1455; IV Piggyback:150] Out: 5855 [Urine:575; Emesis/NG output:5280] Intake/Output this shift: No intake/output data recorded.  PE: Gen:  Alert, NAD, pleasant Card:  RRR, no M/G/R heard Pulm:  CTAB, no W/R/R Abd: multiple well healed incision, soft, NT/ND, +BS, right sided hernia Ext:  No erythema, edema, or tenderness   Lab Results:   Recent Labs  07/07/16 0613 07/08/16 0539  WBC 24.7* 20.8*  HGB 14.2 14.6  HCT 41.5 42.5  PLT 426* 428*   BMET  Recent Labs  07/06/16 1030 07/08/16 0539  NA 137 135  K 4.1 3.5  CL 102 96*  CO2 28 29  GLUCOSE 141* 144*  BUN 19 28*  CREATININE 0.84 1.20  CALCIUM 9.1 9.3   PT/INR No results for input(s): LABPROT, INR in the last 72 hours. CMP     Component Value Date/Time   NA 135 07/08/2016 0539   NA 140 06/16/2016 1159   K 3.5 07/08/2016 0539   CL 96 (L) 07/08/2016 0539   CO2 29 07/08/2016 0539   GLUCOSE 144 (H) 07/08/2016 0539   BUN 28 (H) 07/08/2016 0539   BUN 15 06/16/2016 1159   CREATININE 1.20 07/08/2016 0539   CREATININE 1.10 01/11/2013 1626   CALCIUM 9.3 07/08/2016 0539   PROT 6.3 (L) 07/02/2016 2102   PROT 6.9 06/16/2016 1159   ALBUMIN 2.9 (L) 07/02/2016 2102   ALBUMIN 3.6 06/16/2016 1159   AST 19 07/02/2016 2102   ALT 10 (L) 07/02/2016 2102   ALKPHOS 44  07/02/2016 2102   BILITOT 0.5 07/02/2016 2102   BILITOT 0.6 06/16/2016 1159   GFRNONAA 58 (L) 07/08/2016 0539   GFRNONAA 68 01/11/2013 1626   GFRAA >60 07/08/2016 0539   GFRAA 78 01/11/2013 1626   Lipase  No results found for: LIPASE     Studies/Results: Ct Abdomen Pelvis W Contrast  Result Date: 07/07/2016 CLINICAL DATA:  Right abdominal pain, nausea/vomiting EXAM: CT ABDOMEN AND PELVIS WITH CONTRAST TECHNIQUE: Multidetector CT imaging of the abdomen and pelvis was performed using the standard protocol following bolus administration of intravenous contrast. CONTRAST:  163mL ISOVUE-300 IOPAMIDOL (ISOVUE-300) INJECTION 61% COMPARISON:  07/03/2016 FINDINGS: Lower chest:  Linear scarring/atelectasis in the right lung base. Postsurgical changes related to prior CABG. Hepatobiliary: Stable linear radiodensities along the right liver (series 2/ image 19). Additional scattered calcified granulomata. Status post cholecystectomy. No intrahepatic or extrahepatic ductal dilatation. Pancreas: Within normal limits. Spleen: Surgically absent. Adrenals/Urinary Tract: Adrenal glands are within normal limits. 4.5 cm cyst along the left upper kidney (series 2/ image 36). Right kidney is within normal limits. No hydronephrosis. Nondependent gas within the bladder (series 2/ image 81), correlate for recent instrumentation. Stomach/Bowel: Stomach is moderately distended. Multiple dilated loops of small bowel in the central abdomen, suggesting small bowel obstruction. Focal transition along the  right anterior mid abdomen (series 2/image 34; coronal image 28). Colon is relatively decompressed. Vascular/Lymphatic: No evidence of abdominal aortic aneurysm. Atherosclerotic calcifications of the abdominal aorta and branch vessels. No suspicious abdominopelvic lymphadenopathy. Reproductive: Prostate is unremarkable. Other: No abdominopelvic ascites. Fat within the bilateral inguinal canals. Musculoskeletal: Mild degenerative  changes the lumbar spine. Old/healed right posterior rib fractures. Median sternotomy. IMPRESSION: Multiple loops of small bowel in the central abdomen, with focal transition point along the right anterior mid abdomen, suggesting small bowel obstruction. These results were called by telephone at the time of interpretation on 07/07/2016 at 4:30 pm to Dr. Owens Loffler , who verbally acknowledged these results. Electronically Signed   By: Julian Hy M.D.   On: 07/07/2016 16:36   Dg Abd Portable 1v-small Bowel Obstruction Protocol-initial, 8 Hr Delay  Result Date: 07/08/2016 CLINICAL DATA:  Small bowel obstruction. Gastrografin given 8 fall hours ago. EXAM: PORTABLE ABDOMEN - 1 VIEW COMPARISON:  One view abdomen 07/07/2016. FINDINGS: Gastrografin can be seen throughout the small bowel and into the colon. Contrast within the pelvis may be within dilated loops of small bowel or rectum. A lateral view of the pelvis would be useful for further evaluation. No free air is present. The side port of the NG tube is in the stomach. IMPRESSION: 1. Dilute Gastrografin throughout small bowel and into the colon suggesting resolving small bowel obstruction. 2. Contrast within the pelvis is likely in the distal colon and rectum. This may be with an dilated loops of small bowel. A lateral view of the pelvis would be useful further evaluation. Electronically Signed   By: San Morelle M.D.   On: 07/08/2016 07:54   Dg Abd Portable 1v-small Bowel Protocol-position Verification  Result Date: 07/07/2016 CLINICAL DATA:  NG tube placement EXAM: PORTABLE ABDOMEN - 1 VIEW COMPARISON:  CT abdomen pelvis 07/07/2016 FINDINGS: Nasogastric tube tip and side port overlie the gastric body. Mildly dilated small bowel in the central abdomen seen, consistent with findings of the earlier CT scan. IMPRESSION: NG tube tip and side port overlying the gastric body. Electronically Signed   By: Ulyses Jarred M.D.   On: 07/07/2016 21:30     Anti-infectives: Anti-infectives    Start     Dose/Rate Route Frequency Ordered Stop   07/03/16 1200  vancomycin (VANCOCIN) IVPB 1000 mg/200 mL premix  Status:  Discontinued     1,000 mg 200 mL/hr over 60 Minutes Intravenous Every 12 hours 07/03/16 0305 07/07/16 1636   07/03/16 1200  piperacillin-tazobactam (ZOSYN) IVPB 3.375 g  Status:  Discontinued     3.375 g 100 mL/hr over 30 Minutes Intravenous Every 6 hours 07/03/16 0951 07/03/16 0958   07/03/16 1200  piperacillin-tazobactam (ZOSYN) IVPB 3.375 g     3.375 g 12.5 mL/hr over 240 Minutes Intravenous Every 8 hours 07/03/16 1000     07/03/16 1100  ceFEPIme (MAXIPIME) 1 g in dextrose 5 % 50 mL IVPB  Status:  Discontinued     1 g 100 mL/hr over 30 Minutes Intravenous Every 12 hours 07/03/16 0305 07/03/16 0951   07/03/16 0015  vancomycin (VANCOCIN) IVPB 1000 mg/200 mL premix     1,000 mg 200 mL/hr over 60 Minutes Intravenous  Once 07/03/16 0001 07/03/16 0306   07/03/16 0015  ceFEPIme (MAXIPIME) 2 g in dextrose 5 % 50 mL IVPB     2 g 100 mL/hr over 30 Minutes Intravenous  Once 07/03/16 0002 07/03/16 0152       Assessment/Plan SBO likely 2/2 adhesions -  history of multiple abdominal surgeries including splenectomy, right adrenal mass excision, open cholecystectomy - unknown when last BM or flatus occurred - CT scan 12/20 shows SBO with transition point along the right anterior mid abdomen - NGT placed 12/20 with high output - XR today shows dilute gastrografin into the colon, and contrast within the pelvis may be within dilated loops of small bowel or rectum - no flatus or BM today  Constipation Scrotal abscess s/p I&D 07/03/16 DM HTN CAD  FEN - NPO/NGT, IVF VTE - SCDs,   Plan - no flatus. continue NPO/NGT. Ambulate patient, ok to temporarily clamp NG. Will review XR with MD.   LOS: 5 days    Jerrye Beavers , [facility] Surgery 07/08/2016, 9:46 AM Pager: [phone] Consults: [phone] Mon-Fri  7:00 am-4:30 pm Sat-Sun 7:00 am-11:30 am

## 2016-07-08 NOTE — Progress Notes (Signed)
Pharmacy Antibiotic Note  Dustin Dominguez is a 75 y.o. male admitted on 07/02/2016 with scrotal abscess.  Underwent I&D by urology on 07/03/2016.  Now on D#6 empiric Zosyn; vancomycin was DCd yesterday.   Scrotal abscess culture from 07/03/2016 growing Klebsiella pneumoniae, I to Unasyn but S to cefazolin.  Leukocytosis is improving; MD notes the previous worsening in WBC might have been reactive from SBO w/abd pain.  Plan: De-escalation of empiric regimen - DC Zosyn and begin cefazolin 2 grams IV q8h.   Reviewed with attending MD (Dr Wyline Copas) and received orders.   Height: 5\' 11"  (180.3 cm) Weight: 192 lb 10.9 oz (87.4 kg) IBW/kg (Calculated) : 75.3  Temp (24hrs), Avg:97.9 F (36.6 C), Min:97.7 F (36.5 C), Max:98.2 F (36.8 C)   Recent Labs Lab 07/02/16 2102 07/02/16 2113 07/03/16 0039 07/04/16 0907 07/05/16 0554 07/06/16 0556 07/06/16 1030 07/07/16 0613 07/08/16 0539  WBC 13.7*  --   --  16.2* 19.1* 25.6* 24.3* 24.7* 20.8*  CREATININE 0.94  --   --  1.15 0.77  --  0.84  --  1.20  LATICACIDVEN  --  1.31 1.07  --   --   --   --   --   --   VANCOTROUGH  --   --   --   --   --   --  17  --   --     Estimated Creatinine Clearance: 58.4 mL/min (by C-G formula based on SCr of 1.2 mg/dL).    Allergies  Allergen Reactions  . Amitriptyline Other (See Comments)    sleepy  . Bextra [Valdecoxib] Other (See Comments)    unknown  . Codeine Nausea And Vomiting  . Cymbalta [Duloxetine Hcl] Swelling and Other (See Comments)    dizzy  . Esomeprazole Magnesium Diarrhea  . Niacin-Lovastatin Er Other (See Comments)    headache  . Niaspan [Niacin Er] Other (See Comments)    Increased headache  . Penicillins Rash    Has patient had a PCN reaction causing immediate rash, facial/tongue/throat swelling, SOB or lightheadedness with hypotension: Yes Has patient had a PCN reaction causing severe rash involving mucus membranes or skin necrosis: No Has patient had a PCN reaction that required  hospitalization No Has patient had a PCN reaction occurring within the last 10 years: No Tolerated Zosyn 06/2016     Antimicrobials this admission: 12/16 cefepime x 1 12/16 vancomycin >>12/20 12/16 Zosyn>>12/21 12/21 cefazolin >>   Dose adjustments this admission: n/a  Microbiology results: 12/16: blood culture x2: NGTD 12/16: scrotal abscess: rare Klebsiella pneumoniae R to Unasyn but S to cefazolin 12/15: urine: 60K CoNS   Thank you for allowing pharmacy to be a part of this patient's care.  Clayburn Pert, PharmD, BCPS Pager: [phone] 07/08/2016  10:05 AM

## 2016-07-08 NOTE — Progress Notes (Addendum)
PROGRESS NOTE    Dustin Dominguez  [id number] DOB: June 12, 1941 DOA: 07/02/2016 PCP: Redge Gainer, MD    Brief Narrative:  75 year old gentleman history of type 2 diabetes well controlled, chronic pain, coronary artery disease, hypertension who presented to the ED with worsening pain and swelling the scrotal area onset 3 weeks prior to admission that has been draining and ongoing and worsening symptoms 4-5 days prior to admission. Patient failed outpatient treatment per his PCP.  During this hospital course, patient underwent I and D of abscess by Urology on 12/16 with cultures later growing klebsiella species. Patient later complained of constipation. GI consulted. Patient had little results with cathartics. On follow up imaging, patient found to have SBO with transition zone. Surgery consulted.  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 - Scrotal culture negative thus far. Patient afebrile - Continue dressing changes as recommended per urology.  - Urology has since signed off as of 07/04/2016 with recommendations for follow-up in 10-14 days for wound check. - Wound cx has grown klebsiella species. Will transition abx to cefazolin per sensitivities  #2 abdominal pain secondary to SBO -GI was consulted and is following, appreciate input -Patient recently given trial smog enema with some results, still feeling distended and nauseated -Reviewed CT scan. Findings of SBO with transition zone. -NG placed with ample amounts of bilious fluid  drained -Surgery consulted. recs to continue NG, encourage ambulation  #3 constipation -Per above, cathartics were given -Remains stable  #4 well-controlled diabetes mellitus type 2 -Chart reviewed. Hemoglobin A1c was 5.5 on 09/10/2015.  -glucose trends were reviewed. Remains stable  #5 hypertension -BP remains stable -Patient had been continued on Norvasc, Lopressor, lisinopril,imdur. -Given SBO, have d/c'd PO meds and transitioned to IV ACEI and IV beta blocker -BP remains stable overnight -when able to take PO again, would then resume home meds  #6 coronary artery disease -Remains stable at present without chest pain -Patient with a prior history of CABG with last cath in April 2017 which showed severe 2 vessel disease but patent bypasses, no stents were placed.  -remains asymptomatic  DVT prophylaxis: SCDs Code Status: Full code Family Communication: Patient in room, family not at bedside Disposition Plan: Uncertain at this time  Consultants:   Urology  Gastroenterology  General Surgery  Procedures:   Incision and drainage of scrotal wall abscess and Dr. Diona Fanti 07/03/2016  Antimicrobials: Anti-infectives    Start     Dose/Rate Route Frequency Ordered Stop   07/08/16 1200  ceFAZolin (ANCEF) IVPB 2g/100 mL premix     2 g 200 mL/hr over 30 Minutes Intravenous Every 8 hours 07/08/16 0950     07/03/16 1200  vancomycin (VANCOCIN) IVPB 1000 mg/200 mL premix  Status:  Discontinued     1,000 mg 200 mL/hr over 60 Minutes Intravenous Every 12 hours 07/03/16 0305 07/07/16 1636   07/03/16 1200  piperacillin-tazobactam (ZOSYN) IVPB 3.375 g  Status:  Discontinued     3.375 g 100 mL/hr over 30 Minutes Intravenous Every 6 hours 07/03/16 0951 07/03/16 [id number]  07/03/16 1200  piperacillin-tazobactam (ZOSYN) IVPB 3.375 g  Status:  Discontinued     3.375 g 12.5 mL/hr over 240 Minutes Intravenous Every 8 hours 07/03/16 1000 07/08/16 0950   07/03/16 1100  ceFEPIme (MAXIPIME)  1 g in dextrose 5 % 50 mL IVPB  Status:  Discontinued     1 g 100 mL/hr over 30 Minutes Intravenous Every 12 hours 07/03/16 0305 07/03/16 0951   07/03/16 0015  vancomycin (VANCOCIN) IVPB 1000 mg/200 mL premix     1,000 mg 200 mL/hr over 60 Minutes Intravenous  Once 07/03/16 0001 07/03/16 0306   07/03/16 0015  ceFEPIme (MAXIPIME) 2 g in dextrose 5 % 50 mL IVPB     2 g 100 mL/hr over 30 Minutes Intravenous  Once 07/03/16 0002 07/03/16 0152      Subjective: Reports feeling better with NG in place  Objective: Vitals:   07/07/16 1522 07/07/16 2015 07/08/16 0418 07/08/16 1340  BP: (!) 151/85 (!) 152/72 (!) 157/81 137/73  Pulse: 84 78 68 74  Resp: 20 19 18 20   Temp: 98.2 F (36.8 C) 97.7 F (36.5 C) 97.9 F (36.6 C) 97.7 F (36.5 C)  TempSrc: Oral Oral Oral Oral  SpO2: 99% 100% 99% 99%  Weight:      Height:        Intake/Output Summary (Last 24 hours) at 07/08/16 1610 Last data filed at 07/08/16 1500  Gross per 24 hour  Intake             2380 ml  Output             6625 ml  Net            -4245 ml   Filed Weights   07/02/16 2023 07/03/16 0540  Weight: 85.3 kg (188 lb) 87.4 kg (192 lb 10.9 oz)    Examination:  General exam: Laying in bed, in nad, conversant Respiratory system: normal resp effort, no wheezing Cardiovascular system: regular rate, s1-s2 Gastrointestinal system: decreased BS, distended, no masses Central nervous system: cn2-12 grossly intact, strength/sensation intact Extremities: no clubbing, perfused Skin: normal skin turgor, no notable skin lesions seen Psychiatry: mood normal, no visual hallucinations  Data Reviewed: I have personally reviewed following labs and imaging studies  CBC:  Recent Labs Lab 07/02/16 2102 07/04/16 0907 07/05/16 0554 07/06/16 0556 07/06/16 1030 07/07/16 0613 07/08/16 0539  WBC 13.7* 16.2* 19.1* 25.6* 24.3* 24.7* 20.8*  NEUTROABS 6.7 8.5* 14.9*  --   --   --   --   HGB 12.7* 12.0* 13.9 14.1 14.3 14.2 14.6  HCT  37.6* 34.9* 40.4 40.0 40.3 41.5 42.5  MCV 87.4 86.4 86.0 83.9 83.8 84.9 85.7  PLT 486* 403* 448* 404* 431* 426* [id number]*   Basic Metabolic Panel:  Recent Labs Lab 07/02/16 2102 07/04/16 0907 07/05/16 0554 07/06/16 1030 07/08/16 0539  NA 140 141 136 137 135  K 3.7 4.2 3.5 4.1 3.5  CL 104 105 100* 102 96*  CO2 28 32 25 28 29   GLUCOSE 133* 101* 190* 141* 144*  BUN 17 18 15 19  28*  CREATININE 0.94 1.15 0.77 0.84 1.20  CALCIUM 8.7* 8.6* 9.6 9.1 9.3  MG  --   --  0.9* 1.9  --    GFR: Estimated Creatinine Clearance: 58.4 mL/min (by C-G formula based on SCr of 1.2 mg/dL). Liver Function Tests:  Recent Labs Lab 07/02/16 2102  AST 19  ALT 10*  ALKPHOS 44  BILITOT 0.5  PROT 6.3*  ALBUMIN  2.9*   No results for input(s): LIPASE, AMYLASE in the last 168 hours. No results for input(s): AMMONIA in the last 168 hours. Coagulation Profile:  Recent Labs Lab 07/02/16 2102  INR 1.08   Cardiac Enzymes: No results for input(s): CKTOTAL, CKMB, CKMBINDEX, TROPONINI in the last 168 hours. BNP (last 3 results) No results for input(s): PROBNP in the last 8760 hours. HbA1C: No results for input(s): HGBA1C in the last 72 hours. CBG:  Recent Labs Lab 07/07/16 2019 07/08/16 0020 07/08/16 0411 07/08/16 0737 07/08/16 1132  GLUCAP 109* 119* 133* 121* 141*   Lipid Profile: No results for input(s): CHOL, HDL, LDLCALC, TRIG, CHOLHDL, LDLDIRECT in the last 72 hours. Thyroid Function Tests: No results for input(s): TSH, T4TOTAL, FREET4, T3FREE, THYROIDAB in the last 72 hours. Anemia Panel: No results for input(s): VITAMINB12, FOLATE, FERRITIN, TIBC, IRON, RETICCTPCT in the last 72 hours. Sepsis Labs:  Recent Labs Lab 07/02/16 2113 07/03/16 0039  LATICACIDVEN 1.31 1.07    Recent Results (from the past 240 hour(s))  Urine culture     Status: Abnormal   Collection Time: 07/02/16  9:33 PM  Result Value Ref Range Status   Specimen Description URINE, CLEAN CATCH  Final   Special  Requests NONE  Final   Culture (A)  Final    60,000 COLONIES/mL STAPHYLOCOCCUS SPECIES (COAGULASE NEGATIVE)   Report Status 07/05/2016 FINAL  Final   Organism ID, Bacteria STAPHYLOCOCCUS SPECIES (COAGULASE NEGATIVE) (A)  Final      Susceptibility   Staphylococcus species (coagulase negative) - MIC*    CIPROFLOXACIN >=8 RESISTANT Resistant     GENTAMICIN <=0.5 SENSITIVE Sensitive     NITROFURANTOIN <=16 SENSITIVE Sensitive     OXACILLIN >=4 RESISTANT Resistant     TETRACYCLINE 2 SENSITIVE Sensitive     VANCOMYCIN 1 SENSITIVE Sensitive     TRIMETH/SULFA 80 RESISTANT Resistant     CLINDAMYCIN <=0.25 SENSITIVE Sensitive     RIFAMPIN <=0.5 SENSITIVE Sensitive     Inducible Clindamycin NEGATIVE Sensitive     * 60,000 COLONIES/mL STAPHYLOCOCCUS SPECIES (COAGULASE NEGATIVE)  Blood culture (routine x 2)     Status: None   Collection Time: 07/03/16 12:25 AM  Result Value Ref Range Status   Specimen Description BLOOD LEFT ARM  Final   Special Requests BOTTLES DRAWN AEROBIC AND ANAEROBIC 5CC  Final   Culture NO GROWTH 5 DAYS  Final   Report Status 07/08/2016 FINAL  Final  Blood culture (routine x 2)     Status: None   Collection Time: 07/03/16 12:31 AM  Result Value Ref Range Status   Specimen Description BLOOD RIGHT HAND  Final   Special Requests BOTTLES DRAWN AEROBIC AND ANAEROBIC 5CC  Final   Culture NO GROWTH 5 DAYS  Final   Report Status 07/08/2016 FINAL  Final  Aerobic Culture (superficial specimen)     Status: None   Collection Time: 07/03/16 10:58 AM  Result Value Ref Range Status   Specimen Description ABSCESS SCROTUM  Final   Special Requests NORMAL  Final   Gram Stain   Final    RARE WBC PRESENT, PREDOMINANTLY PMN NO ORGANISMS SEEN Performed at [facility]  Final   Report Status 07/08/2016 FINAL  Final   Organism ID, Bacteria KLEBSIELLA PNEUMONIAE  Final      Susceptibility   Klebsiella pneumoniae - MIC*    AMPICILLIN >=32  RESISTANT Resistant     CEFAZOLIN <=4 SENSITIVE Sensitive  CEFEPIME <=1 SENSITIVE Sensitive     CEFTAZIDIME <=1 SENSITIVE Sensitive     CEFTRIAXONE <=1 SENSITIVE Sensitive     CIPROFLOXACIN >=4 RESISTANT Resistant     GENTAMICIN <=1 SENSITIVE Sensitive     IMIPENEM 0.5 SENSITIVE Sensitive     TRIMETH/SULFA <=20 SENSITIVE Sensitive     AMPICILLIN/SULBACTAM 16 INTERMEDIATE Intermediate     PIP/TAZO 16 SENSITIVE Sensitive     Extended ESBL NEGATIVE Sensitive     * RARE KLEBSIELLA PNEUMONIAE     Radiology Studies: Ct Abdomen Pelvis W Contrast  Result Date: 07/07/2016 CLINICAL DATA:  Right abdominal pain, nausea/vomiting EXAM: CT ABDOMEN AND PELVIS WITH CONTRAST TECHNIQUE: Multidetector CT imaging of the abdomen and pelvis was performed using the standard protocol following bolus administration of intravenous contrast. CONTRAST:  122mL ISOVUE-300 IOPAMIDOL (ISOVUE-300) INJECTION 61% COMPARISON:  07/03/2016 FINDINGS: Lower chest:  Linear scarring/atelectasis in the right lung base. Postsurgical changes related to prior CABG. Hepatobiliary: Stable linear radiodensities along the right liver (series 2/ image 19). Additional scattered calcified granulomata. Status post cholecystectomy. No intrahepatic or extrahepatic ductal dilatation. Pancreas: Within normal limits. Spleen: Surgically absent. Adrenals/Urinary Tract: Adrenal glands are within normal limits. 4.5 cm cyst along the left upper kidney (series 2/ image 36). Right kidney is within normal limits. No hydronephrosis. Nondependent gas within the bladder (series 2/ image 81), correlate for recent instrumentation. Stomach/Bowel: Stomach is moderately distended. Multiple dilated loops of small bowel in the central abdomen, suggesting small bowel obstruction. Focal transition along the right anterior mid abdomen (series 2/image 34; coronal image 28). Colon is relatively decompressed. Vascular/Lymphatic: No evidence of abdominal aortic aneurysm.  Atherosclerotic calcifications of the abdominal aorta and branch vessels. No suspicious abdominopelvic lymphadenopathy. Reproductive: Prostate is unremarkable. Other: No abdominopelvic ascites. Fat within the bilateral inguinal canals. Musculoskeletal: Mild degenerative changes the lumbar spine. Old/healed right posterior rib fractures. Median sternotomy. IMPRESSION: Multiple loops of small bowel in the central abdomen, with focal transition point along the right anterior mid abdomen, suggesting small bowel obstruction. These results were called by telephone at the time of interpretation on 07/07/2016 at 4:30 pm to Dr. Owens Loffler , who verbally acknowledged these results. Electronically Signed   By: Julian Hy M.D.   On: 07/07/2016 16:36   Dg Abd Portable 1v-small Bowel Obstruction Protocol-initial, 8 Hr Delay  Result Date: 07/08/2016 CLINICAL DATA:  Small bowel obstruction. Gastrografin given 8 fall hours ago. EXAM: PORTABLE ABDOMEN - 1 VIEW COMPARISON:  One view abdomen 07/07/2016. FINDINGS: Gastrografin can be seen throughout the small bowel and into the colon. Contrast within the pelvis may be within dilated loops of small bowel or rectum. A lateral view of the pelvis would be useful for further evaluation. No free air is present. The side port of the NG tube is in the stomach. IMPRESSION: 1. Dilute Gastrografin throughout small bowel and into the colon suggesting resolving small bowel obstruction. 2. Contrast within the pelvis is likely in the distal colon and rectum. This may be with an dilated loops of small bowel. A lateral view of the pelvis would be useful further evaluation. Electronically Signed   By: San Morelle M.D.   On: 07/08/2016 07:54   Dg Abd Portable 1v-small Bowel Protocol-position Verification  Result Date: 07/07/2016 CLINICAL DATA:  NG tube placement EXAM: PORTABLE ABDOMEN - 1 VIEW COMPARISON:  CT abdomen pelvis 07/07/2016 FINDINGS: Nasogastric tube tip and side  port overlie the gastric body. Mildly dilated small bowel in the central abdomen seen, consistent with findings of  the earlier CT scan. IMPRESSION: NG tube tip and side port overlying the gastric body. Electronically Signed   By: Ulyses Jarred M.D.   On: 07/07/2016 21:30    Scheduled Meds: .  ceFAZolin (ANCEF) IV  2 g Intravenous Q8H  . enalaprilat  0.625 mg Intravenous Q6H  . Influenza vac split quadrivalent PF  0.5 mL Intramuscular Tomorrow-1000  . insulin aspart  0-9 Units Subcutaneous Q4H  . metoprolol  5 mg Intravenous Q6H  . pantoprazole (PROTONIX) IV  40 mg Intravenous Q12H   Continuous Infusions: . dextrose 5 % and 0.45 % NaCl with KCl 20 mEq/L 75 mL/hr at 07/07/16 1036     LOS: 5 days   Ronita Hargreaves, Orpah Melter, MD [facility] Pager [phone]  If 7PM-7AM, please contact night-coverage [email or web address] Password [email or web address] 07/08/2016, 4:10 PM

## 2016-07-08 NOTE — Progress Notes (Signed)
[facility] Progress Note    Since last GI note: CT scan yesterday showed SBO (focal transition point 'along right anterior mid abdomen.'  NG tube placed with immediate 3 liters output.  He did not sleep well overnight, throat very sore from NG tube but abd feels overall much better.  No flatus  Objective: Vital signs in last 24 hours: Temp:  [97.7 F (36.5 C)-98.2 F (36.8 C)] 97.9 F (36.6 C) (12/21 0418) Pulse Rate:  [68-84] 68 (12/21 0418) Resp:  [18-20] 18 (12/21 0418) BP: (151-157)/(72-85) 157/81 (12/21 0418) SpO2:  [99 %-100 %] 99 % (12/21 0418) Last BM Date: 07/06/16 General: alert and oriented times 3 Heart: regular rate and rythm Abdomen: soft, right large hernia noted- softer as well. Few bowel sounds +, mildly tender at right hernia (that is chronic per patient), otherwise non-tender abd   Lab Results:  Recent Labs  07/06/16 1030 07/07/16 0613 07/08/16 0539  WBC 24.3* 24.7* 20.8*  HGB 14.3 14.2 14.6  PLT 431* 426* 428*  MCV 83.8 84.9 85.7    Recent Labs  07/06/16 1030 07/08/16 0539  NA 137 135  K 4.1 3.5  CL 102 96*  CO2 28 29  GLUCOSE 141* 144*  BUN 19 28*  CREATININE 0.84 1.20  CALCIUM 9.1 9.3   No results for input(s): PROT, ALBUMIN, AST, ALT, ALKPHOS, BILITOT, BILIDIR, IBILI in the last 72 hours. No results for input(s): INR in the last 72 hours.   Studies/Results: Ct Abdomen Pelvis W Contrast  Result Date: 07/07/2016 CLINICAL DATA:  Right abdominal pain, nausea/vomiting EXAM: CT ABDOMEN AND PELVIS WITH CONTRAST TECHNIQUE: Multidetector CT imaging of the abdomen and pelvis was performed using the standard protocol following bolus administration of intravenous contrast. CONTRAST:  114mL ISOVUE-300 IOPAMIDOL (ISOVUE-300) INJECTION 61% COMPARISON:  07/03/2016 FINDINGS: Lower chest:  Linear scarring/atelectasis in the right lung base. Postsurgical changes related to prior CABG. Hepatobiliary: Stable linear radiodensities along the  right liver (series 2/ image 19). Additional scattered calcified granulomata. Status post cholecystectomy. No intrahepatic or extrahepatic ductal dilatation. Pancreas: Within normal limits. Spleen: Surgically absent. Adrenals/Urinary Tract: Adrenal glands are within normal limits. 4.5 cm cyst along the left upper kidney (series 2/ image 36). Right kidney is within normal limits. No hydronephrosis. Nondependent gas within the bladder (series 2/ image 81), correlate for recent instrumentation. Stomach/Bowel: Stomach is moderately distended. Multiple dilated loops of small bowel in the central abdomen, suggesting small bowel obstruction. Focal transition along the right anterior mid abdomen (series 2/image 34; coronal image 28). Colon is relatively decompressed. Vascular/Lymphatic: No evidence of abdominal aortic aneurysm. Atherosclerotic calcifications of the abdominal aorta and branch vessels. No suspicious abdominopelvic lymphadenopathy. Reproductive: Prostate is unremarkable. Other: No abdominopelvic ascites. Fat within the bilateral inguinal canals. Musculoskeletal: Mild degenerative changes the lumbar spine. Old/healed right posterior rib fractures. Median sternotomy. IMPRESSION: Multiple loops of small bowel in the central abdomen, with focal transition point along the right anterior mid abdomen, suggesting small bowel obstruction. These results were called by telephone at the time of interpretation on 07/07/2016 at 4:30 pm to Dr. Owens Loffler , who verbally acknowledged these results. Electronically Signed   By: Julian Hy M.D.   On: 07/07/2016 16:36   Dg Abd Portable 1v-small Bowel Protocol-position Verification  Result Date: 07/07/2016 CLINICAL DATA:  NG tube placement EXAM: PORTABLE ABDOMEN - 1 VIEW COMPARISON:  CT abdomen pelvis 07/07/2016 FINDINGS: Nasogastric tube tip and side port overlie the gastric body. Mildly dilated small bowel in the central abdomen seen,  consistent with findings of  the earlier CT scan. IMPRESSION: NG tube tip and side port overlying the gastric body. Electronically Signed   By: Ulyses Jarred M.D.   On: 07/07/2016 21:30     Medications: Scheduled Meds: . enalaprilat  0.625 mg Intravenous Q6H  . Influenza vac split quadrivalent PF  0.5 mL Intramuscular Tomorrow-1000  . insulin aspart  0-9 Units Subcutaneous Q4H  . metoprolol  5 mg Intravenous Q6H  . ondansetron (ZOFRAN) IV  4 mg Intravenous Q8H  . pantoprazole (PROTONIX) IV  40 mg Intravenous Q12H  . piperacillin-tazobactam (ZOSYN)  IV  3.375 g Intravenous Q8H   Continuous Infusions: . dextrose 5 % and 0.45 % NaCl with KCl 20 mEq/L 75 mL/hr at 07/07/16 1036   PRN Meds:.hydrALAZINE, LORazepam, morphine injection, promethazine    Assessment/Plan: 75 y.o. male admitted with scrotal abscess and developed SBO while in hosp  Likely adhesive.  He feels much better after NG tube placement and WBC is a bit lower (still elevated at 20K though).  Awaiting review of KUB that was just done.  Surgical input appreciated, hopefully this will resolve with conservative measures.  Will follow along.    Milus Banister, MD  07/08/2016, 6:56 AM [facility] Pager [phone]

## 2016-07-09 ENCOUNTER — Inpatient Hospital Stay (HOSPITAL_COMMUNITY): Payer: Medicare Other

## 2016-07-09 DIAGNOSIS — K56609 Unspecified intestinal obstruction, unspecified as to partial versus complete obstruction: Secondary | ICD-10-CM

## 2016-07-09 DIAGNOSIS — E43 Unspecified severe protein-calorie malnutrition: Secondary | ICD-10-CM

## 2016-07-09 LAB — BASIC METABOLIC PANEL
ANION GAP: 11 (ref 5–15)
BUN: 51 mg/dL — ABNORMAL HIGH (ref 6–20)
CALCIUM: 9.9 mg/dL (ref 8.9–10.3)
CO2: 32 mmol/L (ref 22–32)
Chloride: 93 mmol/L — ABNORMAL LOW (ref 101–111)
Creatinine, Ser: 1.82 mg/dL — ABNORMAL HIGH (ref 0.61–1.24)
GFR, EST AFRICAN AMERICAN: 41 mL/min — AB (ref >60)
GFR, EST NON AFRICAN AMERICAN: 35 mL/min — AB (ref >60)
Glucose, Bld: 160 mg/dL — ABNORMAL HIGH (ref 65–99)
Potassium: 3.9 mmol/L (ref 3.5–5.1)
SODIUM: 136 mmol/L (ref 135–145)

## 2016-07-09 LAB — GLUCOSE, CAPILLARY
GLUCOSE-CAPILLARY: 150 mg/dL — AB (ref 65–99)
GLUCOSE-CAPILLARY: 155 mg/dL — AB (ref 65–99)
GLUCOSE-CAPILLARY: 156 mg/dL — AB (ref 65–99)
GLUCOSE-CAPILLARY: 167 mg/dL — AB (ref 65–99)
Glucose-Capillary: 132 mg/dL — ABNORMAL HIGH (ref 65–99)
Glucose-Capillary: 151 mg/dL — ABNORMAL HIGH (ref 65–99)
Glucose-Capillary: 155 mg/dL — ABNORMAL HIGH (ref 65–99)

## 2016-07-09 LAB — CBC
HEMATOCRIT: 45.6 % (ref 39.0–52.0)
Hemoglobin: 16.3 g/dL (ref 13.0–17.0)
MCH: 30 pg (ref 26.0–34.0)
MCHC: 35.7 g/dL (ref 30.0–36.0)
MCV: 83.8 fL (ref 78.0–100.0)
Platelets: 421 10*3/uL — ABNORMAL HIGH (ref 150–400)
RBC: 5.44 MIL/uL (ref 4.22–5.81)
RDW: 14.4 % (ref 11.5–15.5)
WBC: 21.7 10*3/uL — AB (ref 4.0–10.5)

## 2016-07-09 MED ORDER — SODIUM CHLORIDE 0.9 % IV BOLUS (SEPSIS)
1000.0000 mL | Freq: Once | INTRAVENOUS | Status: AC
  Administered 2016-07-09: 1000 mL via INTRAVENOUS

## 2016-07-09 MED ORDER — HEPARIN SODIUM (PORCINE) 5000 UNIT/ML IJ SOLN
5000.0000 [IU] | Freq: Three times a day (TID) | INTRAMUSCULAR | Status: DC
  Administered 2016-07-09 – 2016-07-15 (×19): 5000 [IU] via SUBCUTANEOUS
  Filled 2016-07-09 (×19): qty 1

## 2016-07-09 MED ORDER — BISACODYL 10 MG RE SUPP
10.0000 mg | Freq: Once | RECTAL | Status: AC
Start: 2016-07-09 — End: 2016-07-09
  Administered 2016-07-09: 10 mg via RECTAL
  Filled 2016-07-09: qty 1

## 2016-07-09 NOTE — Progress Notes (Addendum)
PROGRESS NOTE  Dustin Dominguez  [id number] DOB: [birth year] DOA: 07/02/2016 PCP: Redge Gainer, MD  Brief Narrative:   The patient is a 75 year old male with history of diabetes mellitus type 2, chronic pain, coronary artery disease, hypertension who presented to the emergency department with worsening pain and swelling of the scrotum was present for 3 weeks prior to admission. The area was draining purulent fluid and became acutely worse 4-5 days prior to admission. He underwent I&D of scrotal abscess by urology on 12/16 the cultures of which grew Klebsiella. Postoperatively, he developed severe constipation. Gastroenterology was consulted because the patient had little results with cathartics. Imaging demonstrated an SBO with a transition zone in general surgery was consulted. He had an NG tube placed to low intermittent suction and has been receiving laxatives for severe constipation.  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 - Scrotal culture negative thus far. Patient afebrile - Continue dressing changes as recommended per urology.  - Urology has since signed off as of 07/04/2016 with recommendations for follow-up in 10-14 days for wound check. - Continue cefazolin per sensitivities  SBO due to adhesions and constipation. The patient has had splenectomy, right adrenal mass excision and open cholecystectomy previously. -Appreciate gastroenterology and general surgery assistance -NG to  LIS -small stools with suppository - plan for additional suppository tonight before bed  AKI due to inadequate IV fluids and ACE inhibitor.  Creatinine up to 1.8, baseline 0.7 -  Normal saline bolus, increase IV fluids -  Discontinue ACE inhibitor  diet-controlled diabetes mellitus type 2 - Hemoglobin A1c was 5.5 on 09/10/2015.  - continue low dose SSI  Hypertension, blood pressures mildly elevated -Continue to hold oral medications - Continue IV metoprolol -  Add when necessary hydralazine  coronary artery disease s/p CABG with last cath in April 2017 which showed severe 2 vessel disease but patent bypasses -remains asymptomatic - aspirin on hold due to SBO  Chronic pain and anxiety -  Patient is on MS Contin 30 mg by mouth 3 times a day, Percocet 10-3 25 6  times daily, and Valium 10 mg by mouth twice a day at home at baseline -  Continue morphine 2mg  IV q4h prn -  Continue ativan 0.5mg  prn  Severe protein calorie malnutrition -  Agree with waiting an additional 24 hours to determine if his SBO will resolve before starting PICC line with TNA -  Plan to start supplements once diet advanced  DVT prophylaxis:  Heparin Code Status:  Full code Family Communication:  Patient alone Disposition Plan:  Pending resolution of bowel obstruction   Consultants:   Urology  [facility], Dr. Ardis Hughs  General Surgery  Procedures:   Incision and drainage of scrotal wall abscess and Dr. Dahlstedt12/16/2017  Antimicrobials:  Anti-infectives    Start     Dose/Rate Route Frequency Ordered Stop   07/08/16 1200  ceFAZolin (ANCEF) IVPB 2g/100 mL premix     2 g 200 mL/hr over 30 Minutes Intravenous Every 8 hours 07/08/16  [id number]     07/03/16 1200  vancomycin (VANCOCIN) IVPB 1000 mg/200 mL premix  Status:  Discontinued     1,000 mg 200 mL/hr over 60 Minutes Intravenous Every 12 hours 07/03/16 0305 07/07/16 1636   07/03/16 1200  piperacillin-tazobactam (ZOSYN) IVPB 3.375 g   Status:  Discontinued     3.375 g 100 mL/hr over 30 Minutes Intravenous Every 6 hours 07/03/16 0951 07/03/16 0958   07/03/16 1200  piperacillin-tazobactam (ZOSYN) IVPB 3.375 g  Status:  Discontinued     3.375 g 12.5 mL/hr over 240 Minutes Intravenous Every 8 hours 07/03/16 1000 07/08/16 0950   07/03/16 1100  ceFEPIme (MAXIPIME) 1 g in dextrose 5 % 50 mL IVPB  Status:  Discontinued     1 g 100 mL/hr over 30 Minutes Intravenous Every 12 hours 07/03/16 0305 07/03/16 0951   07/03/16 0015  vancomycin (VANCOCIN) IVPB 1000 mg/200 mL premix     1,000 mg 200 mL/hr over 60 Minutes Intravenous  Once 07/03/16 0001 07/03/16 0306   07/03/16 0015  ceFEPIme (MAXIPIME) 2 g in dextrose 5 % 50 mL IVPB     2 g 100 mL/hr over 30 Minutes Intravenous  Once 07/03/16 0002 07/03/16 0152       Subjective: Still not passing flatness. Denies abdominal pain or bloating currently.  Denies shortness of breath. Feels thirsty but a little lightheaded.   Objective: Vitals:   07/08/16 2102 07/09/16 0611 07/09/16 1509 07/09/16 1518  BP: (!) 151/71 132/64  (!) 129/96  Pulse: 77 71  (!) 108  Resp: 19 19  18   Temp: 98.5 F (36.9 C) 98.2 F (36.8 C)  97.5 F (36.4 C)  TempSrc: Oral Oral  Oral  SpO2: 97% 97%  98%  Weight:   78.5 kg (173 lb)   Height:        Intake/Output Summary (Last 24 hours) at 07/09/16 1519 Last data filed at 07/09/16 1400  Gross per 24 hour  Intake             3335 ml  Output             1400 ml  Net             1935 ml   Filed Weights   07/02/16 2023 07/03/16 0540 07/09/16 1509  Weight: 85.3 kg (188 lb) 87.4 kg (192 lb 10.9 oz) 78.5 kg (173 lb)    Examination:  General exam:  Adult Male.  No acute distress.  HEENT:  NCAT, MMM Respiratory system: Clear to auscultation bilaterally Cardiovascular system: Regular rate and rhythm, normal S1/S2. No murmurs, rubs, gallops or clicks.  Warm extremities Gastrointestinal system: Hypoactive bowel sounds, soft, minimally distended over the  right upper and lower quadrants, nontender. MSK:  Normal tone and bulk, no lower extremity edema Neuro:  Grossly intact    Data Reviewed: I have personally reviewed following labs and imaging studies  CBC:  Recent Labs Lab 07/02/16 2102 07/04/16 0907 07/05/16 0554 07/06/16 0556 07/06/16 1030 07/07/16 0613 07/08/16 0539 07/09/16 0524  WBC 13.7* 16.2* 19.1* 25.6* 24.3* 24.7* 20.8* 21.7*  NEUTROABS 6.7 8.5* 14.9*  --   --   --   --   --   HGB 12.7* 12.0* 13.9 14.1 14.3 14.2 14.6 16.3  HCT 37.6* 34.9* 40.4 40.0 40.3 41.5 42.5 45.6  MCV 87.4 86.4 86.0 83.9 83.8 84.9 85.7 83.8  PLT 486* 403* 448* 404* 431* 426* 428* [id number]*   Basic Metabolic Panel:  Recent Labs Lab 07/04/16  [id number] 07/05/16 0554 07/06/16 1030 07/08/16 0539 07/09/16 0524  NA 141 136 137 135 136  K 4.2 3.5 4.1 3.5 3.9  CL 105 100* 102 96* 93*  CO2 32 25 28 29  32  GLUCOSE 101* 190* 141* 144* 160*  BUN 18 15 19  28* 51*  CREATININE 1.15 0.77 0.84 1.20 1.82*  CALCIUM 8.6* 9.6 9.1 9.3 9.9  MG  --  0.9* 1.9  --   --    GFR: Estimated Creatinine Clearance: 38.5 mL/min (by C-G formula based on SCr of 1.82 mg/dL (H)). Liver Function Tests:  Recent Labs Lab 07/02/16 2102  AST 19  ALT 10*  ALKPHOS 44  BILITOT 0.5  PROT 6.3*  ALBUMIN 2.9*   No results for input(s): LIPASE, AMYLASE in the last 168 hours. No results for input(s): AMMONIA in the last 168 hours. Coagulation Profile:  Recent Labs Lab 07/02/16 2102  INR 1.08   Cardiac Enzymes: No results for input(s): CKTOTAL, CKMB, CKMBINDEX, TROPONINI in the last 168 hours. BNP (last 3 results) No results for input(s): PROBNP in the last 8760 hours. HbA1C: No results for input(s): HGBA1C in the last 72 hours. CBG:  Recent Labs Lab 07/08/16 2004 07/09/16 0016 07/09/16 0404 07/09/16 0736 07/09/16 1140  GLUCAP 151* 167* 156* 150* 151*   Lipid Profile: No results for input(s): CHOL, HDL, LDLCALC, TRIG, CHOLHDL, LDLDIRECT in the last 72  hours. Thyroid Function Tests: No results for input(s): TSH, T4TOTAL, FREET4, T3FREE, THYROIDAB in the last 72 hours. Anemia Panel: No results for input(s): VITAMINB12, FOLATE, FERRITIN, TIBC, IRON, RETICCTPCT in the last 72 hours. Urine analysis:    Component Value Date/Time   COLORURINE YELLOW 07/02/2016 2134   APPEARANCEUR CLEAR 07/02/2016 2134   APPEARANCEUR Cloudy (A) 06/16/2016 1131   LABSPEC 1.021 07/02/2016 2134   PHURINE 5.0 07/02/2016 2134   GLUCOSEU NEGATIVE 07/02/2016 2134   HGBUR SMALL (A) 07/02/2016 2134   BILIRUBINUR NEGATIVE 07/02/2016 2134   BILIRUBINUR Negative 06/16/2016 [place] 07/02/2016 2134   PROTEINUR NEGATIVE 07/02/2016 2134   NITRITE NEGATIVE 07/02/2016 2134   LEUKOCYTESUR TRACE (A) 07/02/2016 2134   LEUKOCYTESUR 3+ (A) 06/16/2016 1131   Sepsis Labs: @LABRCNTIP (procalcitonin:4,lacticidven:4)  ) Recent Results (from the past 240 hour(s))  Urine culture     Status: Abnormal   Collection Time: 07/02/16  9:33 PM  Result Value Ref Range Status   Specimen Description URINE, CLEAN CATCH  Final   Special Requests NONE  Final   Culture (A)  Final    60,000 COLONIES/mL STAPHYLOCOCCUS SPECIES (COAGULASE NEGATIVE)   Report Status 07/05/2016 FINAL  Final   Organism ID, Bacteria STAPHYLOCOCCUS SPECIES (COAGULASE NEGATIVE) (A)  Final      Susceptibility   Staphylococcus species (coagulase negative) - MIC*    CIPROFLOXACIN >=8 RESISTANT Resistant     GENTAMICIN <=0.5 SENSITIVE Sensitive     NITROFURANTOIN <=16 SENSITIVE Sensitive     OXACILLIN >=4 RESISTANT Resistant     TETRACYCLINE 2 SENSITIVE Sensitive     VANCOMYCIN 1 SENSITIVE Sensitive     TRIMETH/SULFA 80 RESISTANT Resistant     CLINDAMYCIN <=0.25 SENSITIVE Sensitive     RIFAMPIN <=0.5 SENSITIVE Sensitive     Inducible Clindamycin NEGATIVE Sensitive     * 60,000 COLONIES/mL STAPHYLOCOCCUS SPECIES (COAGULASE NEGATIVE)  Blood culture (routine x 2)     Status: None   Collection Time:  07/03/16 12:25 AM  Result Value Ref Range Status   Specimen Description BLOOD LEFT ARM  Final  Special Requests BOTTLES DRAWN AEROBIC AND ANAEROBIC 5CC  Final   Culture NO GROWTH 5 DAYS  Final   Report Status 07/08/2016 FINAL  Final  Blood culture (routine x 2)     Status: None   Collection Time: 07/03/16 12:31 AM  Result Value Ref Range Status   Specimen Description BLOOD RIGHT HAND  Final   Special Requests BOTTLES DRAWN AEROBIC AND ANAEROBIC 5CC  Final   Culture NO GROWTH 5 DAYS  Final   Report Status 07/08/2016 FINAL  Final  Aerobic Culture (superficial specimen)     Status: None   Collection Time: 07/03/16 10:58 AM  Result Value Ref Range Status   Specimen Description ABSCESS SCROTUM  Final   Special Requests NORMAL  Final   Gram Stain   Final    RARE WBC PRESENT, PREDOMINANTLY PMN NO ORGANISMS SEEN Performed at [place]  Final   Report Status 07/08/2016 FINAL  Final   Organism ID, Bacteria KLEBSIELLA PNEUMONIAE  Final      Susceptibility   Klebsiella pneumoniae - MIC*    AMPICILLIN >=32 RESISTANT Resistant     CEFAZOLIN <=4 SENSITIVE Sensitive     CEFEPIME <=1 SENSITIVE Sensitive     CEFTAZIDIME <=1 SENSITIVE Sensitive     CEFTRIAXONE <=1 SENSITIVE Sensitive     CIPROFLOXACIN >=4 RESISTANT Resistant     GENTAMICIN <=1 SENSITIVE Sensitive     IMIPENEM 0.5 SENSITIVE Sensitive     TRIMETH/SULFA <=20 SENSITIVE Sensitive     AMPICILLIN/SULBACTAM 16 INTERMEDIATE Intermediate     PIP/TAZO 16 SENSITIVE Sensitive     Extended ESBL NEGATIVE Sensitive     * RARE KLEBSIELLA PNEUMONIAE      Radiology Studies: Dg Abd 1 View  Result Date: 07/09/2016 CLINICAL DATA:  Small-bowel obstruction. EXAM: ABDOMEN - 1 VIEW COMPARISON:  07/08/2016.  CT 07/07/2016. FINDINGS: NG tube noted with tip projected over the upper stomach. Surgical clips upper abdomen. Dilated loops of small bowel noted suggesting small bowel obstruction. No free  air. Similar findings noted on prior exam. IMPRESSION: 1. NG tube tip noted over the upper stomach . 2. Persistent small bowel dilatation consistent with persistent small-bowel obstruction. Similar findings noted on prior exams. Electronically Signed   By: Marcello Moores  Register   On: 07/09/2016 06:53   Ct Abdomen Pelvis W Contrast  Result Date: 07/07/2016 CLINICAL DATA:  Right abdominal pain, nausea/vomiting EXAM: CT ABDOMEN AND PELVIS WITH CONTRAST TECHNIQUE: Multidetector CT imaging of the abdomen and pelvis was performed using the standard protocol following bolus administration of intravenous contrast. CONTRAST:  161mL ISOVUE-300 IOPAMIDOL (ISOVUE-300) INJECTION 61% COMPARISON:  07/03/2016 FINDINGS: Lower chest:  Linear scarring/atelectasis in the right lung base. Postsurgical changes related to prior CABG. Hepatobiliary: Stable linear radiodensities along the right liver (series 2/ image 19). Additional scattered calcified granulomata. Status post cholecystectomy. No intrahepatic or extrahepatic ductal dilatation. Pancreas: Within normal limits. Spleen: Surgically absent. Adrenals/Urinary Tract: Adrenal glands are within normal limits. 4.5 cm cyst along the left upper kidney (series 2/ image 36). Right kidney is within normal limits. No hydronephrosis. Nondependent gas within the bladder (series 2/ image 81), correlate for recent instrumentation. Stomach/Bowel: Stomach is moderately distended. Multiple dilated loops of small bowel in the central abdomen, suggesting small bowel obstruction. Focal transition along the right anterior mid abdomen (series 2/image 34; coronal image 28). Colon is relatively decompressed. Vascular/Lymphatic: No evidence of abdominal aortic aneurysm. Atherosclerotic calcifications of the abdominal aorta and branch vessels. No  suspicious abdominopelvic lymphadenopathy. Reproductive: Prostate is unremarkable. Other: No abdominopelvic ascites. Fat within the bilateral inguinal canals.  Musculoskeletal: Mild degenerative changes the lumbar spine. Old/healed right posterior rib fractures. Median sternotomy. IMPRESSION: Multiple loops of small bowel in the central abdomen, with focal transition point along the right anterior mid abdomen, suggesting small bowel obstruction. These results were called by telephone at the time of interpretation on 07/07/2016 at 4:30 pm to Dr. Owens Loffler , who verbally acknowledged these results. Electronically Signed   By: Julian Hy M.D.   On: 07/07/2016 16:36   Dg Abd Portable 1v  Result Date: 07/08/2016 CLINICAL DATA:  Enteric tube placement EXAM: PORTABLE ABDOMEN - 1 VIEW COMPARISON:  07/08/2016 abdominal radiograph FINDINGS: Enteric tube terminates in the proximal stomach with the side port near the esophagogastric junction. CABG clips overlie the mediastinum. Visualized median sternotomy wires are stable in configuration. Surgical clips are present in the right upper quadrant of the abdomen. No disproportionately dilated small bowel loops. No evidence of pneumatosis or pneumoperitoneum. Clear lung bases. IMPRESSION: Enteric tube terminates in the proximal stomach. Electronically Signed   By: Ilona Sorrel M.D.   On: 07/08/2016 17:57   Dg Abd Portable 1v-small Bowel Obstruction Protocol-initial, 8 Hr Delay  Result Date: 07/08/2016 CLINICAL DATA:  Small bowel obstruction. Gastrografin given 8 fall hours ago. EXAM: PORTABLE ABDOMEN - 1 VIEW COMPARISON:  One view abdomen 07/07/2016. FINDINGS: Gastrografin can be seen throughout the small bowel and into the colon. Contrast within the pelvis may be within dilated loops of small bowel or rectum. A lateral view of the pelvis would be useful for further evaluation. No free air is present. The side port of the NG tube is in the stomach. IMPRESSION: 1. Dilute Gastrografin throughout small bowel and into the colon suggesting resolving small bowel obstruction. 2. Contrast within the pelvis is likely in the  distal colon and rectum. This may be with an dilated loops of small bowel. A lateral view of the pelvis would be useful further evaluation. Electronically Signed   By: San Morelle M.D.   On: 07/08/2016 07:54   Dg Abd Portable 1v-small Bowel Protocol-position Verification  Result Date: 07/07/2016 CLINICAL DATA:  NG tube placement EXAM: PORTABLE ABDOMEN - 1 VIEW COMPARISON:  CT abdomen pelvis 07/07/2016 FINDINGS: Nasogastric tube tip and side port overlie the gastric body. Mildly dilated small bowel in the central abdomen seen, consistent with findings of the earlier CT scan. IMPRESSION: NG tube tip and side port overlying the gastric body. Electronically Signed   By: Ulyses Jarred M.D.   On: 07/07/2016 21:30     Scheduled Meds: .  ceFAZolin (ANCEF) IV  2 g Intravenous Q8H  . enalaprilat  0.625 mg Intravenous Q6H  . Influenza vac split quadrivalent PF  0.5 mL Intramuscular Tomorrow-1000  . insulin aspart  0-9 Units Subcutaneous Q4H  . metoprolol  5 mg Intravenous Q6H  . pantoprazole (PROTONIX) IV  40 mg Intravenous Q12H   Continuous Infusions: . dextrose 5 % and 0.45 % NaCl with KCl 20 mEq/L 125 mL/hr at 07/09/16 0812     LOS: 6 days    Time spent: 30 min    Janece Canterbury, MD [facility] Pager [phone]  If 7PM-7AM, please contact night-coverage [email or web address] Password [facility] 07/09/2016, 3:19 PM
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
## 2016-07-09 NOTE — Progress Notes (Signed)
Patient ID: Dustin Dominguez, male   DOB: 1940/09/24, 75 y.o.   MRN: [id number]    Progress Note   Subjective   Miserable  With NG tube - bothering throat /nose  No nausea or vomiting,denies any significant pain. Has not passed any stool or flatus past 24 hours   Objective   Vital signs in last 24 hours: Temp:  [97.7 F (36.5 C)-98.5 F (36.9 C)] 98.2 F (36.8 C) (12/22 0611) Pulse Rate:  [71-86] 71 (12/22 0611) Resp:  [19-20] 19 (12/22 0611) BP: (132-151)/(64-74) 132/64 (12/22 0611) SpO2:  [97 %-99 %] 97 % (12/22 0611) Last BM Date: 07/06/16 General:    white male in NAD NG in and clamped Heart:  Regular rate and rhythm; no murmurs Lungs: Respirations even and unlabored, lungs CTA bilaterally Abdomen:  Soft, tender RLQ and nondistended. BS quiet. Extremities:  Without edema. Neurologic:  Alert and oriented,  grossly normal neurologically. Psych:  Cooperative. Normal mood and affect.  Intake/Output from previous day: 12/21 0701 - 12/22 0700 In: 2020 [P.O.:120; I.V.:900; NG/GT:900; IV Piggyback:100] Out: 1850 [Urine:150; Emesis/NG output:1700] Intake/Output this shift: No intake/output data recorded.  Lab Results:  Recent Labs  07/07/16 0613 07/08/16 0539 07/09/16 0524  WBC 24.7* 20.8* 21.7*  HGB 14.2 14.6 16.3  HCT 41.5 42.5 45.6  PLT 426* 428* 421*   BMET  Recent Labs  07/06/16 1030 07/08/16 0539 07/09/16 0524  NA 137 135 136  K 4.1 3.5 3.9  CL 102 96* 93*  CO2 28 29 32  GLUCOSE 141* 144* 160*  BUN 19 28* 51*  CREATININE 0.84 1.20 1.82*  CALCIUM 9.1 9.3 9.9   LFT No results for input(s): PROT, ALBUMIN, AST, ALT, ALKPHOS, BILITOT, BILIDIR, IBILI in the last 72 hours. PT/INR No results for input(s): LABPROT, INR in the last 72 hours.  Studies/Results: Dg Abd 1 View  Result Date: 07/09/2016 CLINICAL DATA:  Small-bowel obstruction. EXAM: ABDOMEN - 1 VIEW COMPARISON:  07/08/2016.  CT 07/07/2016. FINDINGS: NG tube noted with tip projected over the  upper stomach. Surgical clips upper abdomen. Dilated loops of small bowel noted suggesting small bowel obstruction. No free air. Similar findings noted on prior exam. IMPRESSION: 1. NG tube tip noted over the upper stomach . 2. Persistent small bowel dilatation consistent with persistent small-bowel obstruction. Similar findings noted on prior exams. Electronically Signed   By: Marcello Moores  Register   On: 07/09/2016 06:53   Ct Abdomen Pelvis W Contrast  Result Date: 07/07/2016 CLINICAL DATA:  Right abdominal pain, nausea/vomiting EXAM: CT ABDOMEN AND PELVIS WITH CONTRAST TECHNIQUE: Multidetector CT imaging of the abdomen and pelvis was performed using the standard protocol following bolus administration of intravenous contrast. CONTRAST:  151mL ISOVUE-300 IOPAMIDOL (ISOVUE-300) INJECTION 61% COMPARISON:  07/03/2016 FINDINGS: Lower chest:  Linear scarring/atelectasis in the right lung base. Postsurgical changes related to prior CABG. Hepatobiliary: Stable linear radiodensities along the right liver (series 2/ image 19). Additional scattered calcified granulomata. Status post cholecystectomy. No intrahepatic or extrahepatic ductal dilatation. Pancreas: Within normal limits. Spleen: Surgically absent. Adrenals/Urinary Tract: Adrenal glands are within normal limits. 4.5 cm cyst along the left upper kidney (series 2/ image 36). Right kidney is within normal limits. No hydronephrosis. Nondependent gas within the bladder (series 2/ image 81), correlate for recent instrumentation. Stomach/Bowel: Stomach is moderately distended. Multiple dilated loops of small bowel in the central abdomen, suggesting small bowel obstruction. Focal transition along the right anterior mid abdomen (series 2/image 34; coronal image 28). Colon is relatively decompressed. Vascular/Lymphatic:  No evidence of abdominal aortic aneurysm. Atherosclerotic calcifications of the abdominal aorta and branch vessels. No suspicious abdominopelvic  lymphadenopathy. Reproductive: Prostate is unremarkable. Other: No abdominopelvic ascites. Fat within the bilateral inguinal canals. Musculoskeletal: Mild degenerative changes the lumbar spine. Old/healed right posterior rib fractures. Median sternotomy. IMPRESSION: Multiple loops of small bowel in the central abdomen, with focal transition point along the right anterior mid abdomen, suggesting small bowel obstruction. These results were called by telephone at the time of interpretation on 07/07/2016 at 4:30 pm to Dr. Owens Loffler , who verbally acknowledged these results. Electronically Signed   By: Julian Hy M.D.   On: 07/07/2016 16:36   Dg Abd Portable 1v  Result Date: 07/08/2016 CLINICAL DATA:  Enteric tube placement EXAM: PORTABLE ABDOMEN - 1 VIEW COMPARISON:  07/08/2016 abdominal radiograph FINDINGS: Enteric tube terminates in the proximal stomach with the side port near the esophagogastric junction. CABG clips overlie the mediastinum. Visualized median sternotomy wires are stable in configuration. Surgical clips are present in the right upper quadrant of the abdomen. No disproportionately dilated small bowel loops. No evidence of pneumatosis or pneumoperitoneum. Clear lung bases. IMPRESSION: Enteric tube terminates in the proximal stomach. Electronically Signed   By: Ilona Sorrel M.D.   On: 07/08/2016 17:57   Dg Abd Portable 1v-small Bowel Obstruction Protocol-initial, 8 Hr Delay  Result Date: 07/08/2016 CLINICAL DATA:  Small bowel obstruction. Gastrografin given 8 fall hours ago. EXAM: PORTABLE ABDOMEN - 1 VIEW COMPARISON:  One view abdomen 07/07/2016. FINDINGS: Gastrografin can be seen throughout the small bowel and into the colon. Contrast within the pelvis may be within dilated loops of small bowel or rectum. A lateral view of the pelvis would be useful for further evaluation. No free air is present. The side port of the NG tube is in the stomach. IMPRESSION: 1. Dilute Gastrografin  throughout small bowel and into the colon suggesting resolving small bowel obstruction. 2. Contrast within the pelvis is likely in the distal colon and rectum. This may be with an dilated loops of small bowel. A lateral view of the pelvis would be useful further evaluation. Electronically Signed   By: San Morelle M.D.   On: 07/08/2016 07:54   Dg Abd Portable 1v-small Bowel Protocol-position Verification  Result Date: 07/07/2016 CLINICAL DATA:  NG tube placement EXAM: PORTABLE ABDOMEN - 1 VIEW COMPARISON:  CT abdomen pelvis 07/07/2016 FINDINGS: Nasogastric tube tip and side port overlie the gastric body. Mildly dilated small bowel in the central abdomen seen, consistent with findings of the earlier CT scan. IMPRESSION: NG tube tip and side port overlying the gastric body. Electronically Signed   By: Ulyses Jarred M.D.   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 NG placed 2/20  -not much progress since. KUB this am continues to show SBO, NG in upper stomach   #2 scrotal abscess #3  AKI- creat on the rise-  #4 AODM  Plan; Place NG back to suction -has been clamped Ambulate Dulcolax supp to stimulate bowel from below Surgery following Increase IV fluids     LOS: 6 days   Amy Esterwood  07/09/2016, 8:50 AM   ________________________________________________________________________  Velora Heckler GI MD note:  I personally examined the patient, reviewed the data and agree with the assessment and plan described above. Still no flatus as of 3pm. Bowel sounds very hypoactive.  He has  not had any real PO intake in about a week and has lost 10 pounds in the interim.  Dietician was asking about starting TNA which I told her to wait another 24 hours to see if he will be undergoing ex lap if the SBO fails to resolve.  Please call or page with any further questions or concerns.  No plans  for further GI testing, procedures at this point.    Owens Loffler, MD [facility] Gastroenter[facility] Pager [phone]

## 2016-07-09 NOTE — Progress Notes (Signed)
Patient ID: Dustin Dominguez, male   DOB: Dec 09, 1940, 75 y.o.   MRN: [id number]  [facility] Surgery Progress Note     Subjective: Denies any current abdominal pain or bloating. Did not get to walk yesterday despite wanting to. Not passing flatus.   Objective: Vital signs in last 24 hours: Temp:  [97.7 F (36.5 C)-98.5 F (36.9 C)] 98.2 F (36.8 C) (12/22 0611) Pulse Rate:  [71-86] 71 (12/22 0611) Resp:  [19-20] 19 (12/22 0611) BP: (132-151)/(64-74) 132/64 (12/22 0611) SpO2:  [97 %-99 %] 97 % (12/22 0611) Last BM Date: 07/06/16  Intake/Output from previous day: 12/21 0701 - 12/22 0700 In: 2020 [P.O.:120; I.V.:900; NG/GT:900; IV Piggyback:100] Out: 1850 [Urine:150; Emesis/NG output:1700] Intake/Output this shift: No intake/output data recorded.  PE: Gen:  Alert, NAD, pleasant Card:  RRR, no M/G/R heard Pulm:  CTAB, no W/R/R Abd: multiple well healed incision, soft, NT/ND, +BS, right sided hernia Ext:  No erythema, edema, or tenderness   Lab Results:   Recent Labs  07/08/16 0539 07/09/16 0524  WBC 20.8* 21.7*  HGB 14.6 16.3  HCT 42.5 45.6  PLT 428* 421*   BMET  Recent Labs  07/08/16 0539 07/09/16 0524  NA 135 136  K 3.5 3.9  CL 96* 93*  CO2 29 32  GLUCOSE 144* 160*  BUN 28* 51*  CREATININE 1.20 1.82*  CALCIUM 9.3 9.9   PT/INR No results for input(s): LABPROT, INR in the last 72 hours. CMP     Component Value Date/Time   NA 136 07/09/2016 0524   NA 140 06/16/2016 1159   K 3.9 07/09/2016 0524   CL 93 (L) 07/09/2016 0524   CO2 32 07/09/2016 0524   GLUCOSE 160 (H) 07/09/2016 0524   BUN 51 (H) 07/09/2016 0524   BUN 15 06/16/2016 1159   CREATININE 1.82 (H) 07/09/2016 0524   CREATININE 1.10 01/11/2013 1626   CALCIUM 9.9 07/09/2016 0524   PROT 6.3 (L) 07/02/2016 2102   PROT 6.9 06/16/2016 1159   ALBUMIN 2.9 (L) 07/02/2016 2102   ALBUMIN 3.6 06/16/2016 1159   AST 19 07/02/2016 2102   ALT 10 (L) 07/02/2016 2102   ALKPHOS 44 07/02/2016 2102    BILITOT 0.5 07/02/2016 2102   BILITOT 0.6 06/16/2016 1159   GFRNONAA 35 (L) 07/09/2016 0524   GFRNONAA 68 01/11/2013 1626   GFRAA 41 (L) 07/09/2016 0524   GFRAA 78 01/11/2013 1626   Lipase  No results found for: LIPASE     Studies/Results: Dg Abd 1 View  Result Date: 07/09/2016 CLINICAL DATA:  Small-bowel obstruction. EXAM: ABDOMEN - 1 VIEW COMPARISON:  07/08/2016.  CT 07/07/2016. FINDINGS: NG tube noted with tip projected over the upper stomach. Surgical clips upper abdomen. Dilated loops of small bowel noted suggesting small bowel obstruction. No free air. Similar findings noted on prior exam. IMPRESSION: 1. NG tube tip noted over the upper stomach . 2. Persistent small bowel dilatation consistent with persistent small-bowel obstruction. Similar findings noted on prior exams. Electronically Signed   By: Marcello Moores  Register   On: 07/09/2016 06:53   Ct Abdomen Pelvis W Contrast  Result Date: 07/07/2016 CLINICAL DATA:  Right abdominal pain, nausea/vomiting EXAM: CT ABDOMEN AND PELVIS WITH CONTRAST TECHNIQUE: Multidetector CT imaging of the abdomen and pelvis was performed using the standard protocol following bolus administration of intravenous contrast. CONTRAST:  124mL ISOVUE-300 IOPAMIDOL (ISOVUE-300) INJECTION 61% COMPARISON:  07/03/2016 FINDINGS: Lower chest:  Linear scarring/atelectasis in the right lung base. Postsurgical changes related to prior CABG.  Hepatobiliary: Stable linear radiodensities along the right liver (series 2/ image 19). Additional scattered calcified granulomata. Status post cholecystectomy. No intrahepatic or extrahepatic ductal dilatation. Pancreas: Within normal limits. Spleen: Surgically absent. Adrenals/Urinary Tract: Adrenal glands are within normal limits. 4.5 cm cyst along the left upper kidney (series 2/ image 36). Right kidney is within normal limits. No hydronephrosis. Nondependent gas within the bladder (series 2/ image 81), correlate for recent  instrumentation. Stomach/Bowel: Stomach is moderately distended. Multiple dilated loops of small bowel in the central abdomen, suggesting small bowel obstruction. Focal transition along the right anterior mid abdomen (series 2/image 34; coronal image 28). Colon is relatively decompressed. Vascular/Lymphatic: No evidence of abdominal aortic aneurysm. Atherosclerotic calcifications of the abdominal aorta and branch vessels. No suspicious abdominopelvic lymphadenopathy. Reproductive: Prostate is unremarkable. Other: No abdominopelvic ascites. Fat within the bilateral inguinal canals. Musculoskeletal: Mild degenerative changes the lumbar spine. Old/healed right posterior rib fractures. Median sternotomy. IMPRESSION: Multiple loops of small bowel in the central abdomen, with focal transition point along the right anterior mid abdomen, suggesting small bowel obstruction. These results were called by telephone at the time of interpretation on 07/07/2016 at 4:30 pm to Dr. Owens Loffler , who verbally acknowledged these results. Electronically Signed   By: Julian Hy M.D.   On: 07/07/2016 16:36   Dg Abd Portable 1v  Result Date: 07/08/2016 CLINICAL DATA:  Enteric tube placement EXAM: PORTABLE ABDOMEN - 1 VIEW COMPARISON:  07/08/2016 abdominal radiograph FINDINGS: Enteric tube terminates in the proximal stomach with the side port near the esophagogastric junction. CABG clips overlie the mediastinum. Visualized median sternotomy wires are stable in configuration. Surgical clips are present in the right upper quadrant of the abdomen. No disproportionately dilated small bowel loops. No evidence of pneumatosis or pneumoperitoneum. Clear lung bases. IMPRESSION: Enteric tube terminates in the proximal stomach. Electronically Signed   By: Ilona Sorrel M.D.   On: 07/08/2016 17:57   Dg Abd Portable 1v-small Bowel Obstruction Protocol-initial, 8 Hr Delay  Result Date: 07/08/2016 CLINICAL DATA:  Small bowel obstruction.  Gastrografin given 8 fall hours ago. EXAM: PORTABLE ABDOMEN - 1 VIEW COMPARISON:  One view abdomen 07/07/2016. FINDINGS: Gastrografin can be seen throughout the small bowel and into the colon. Contrast within the pelvis may be within dilated loops of small bowel or rectum. A lateral view of the pelvis would be useful for further evaluation. No free air is present. The side port of the NG tube is in the stomach. IMPRESSION: 1. Dilute Gastrografin throughout small bowel and into the colon suggesting resolving small bowel obstruction. 2. Contrast within the pelvis is likely in the distal colon and rectum. This may be with an dilated loops of small bowel. A lateral view of the pelvis would be useful further evaluation. Electronically Signed   By: San Morelle M.D.   On: 07/08/2016 07:54   Dg Abd Portable 1v-small Bowel Protocol-position Verification  Result Date: 07/07/2016 CLINICAL DATA:  NG tube placement EXAM: PORTABLE ABDOMEN - 1 VIEW COMPARISON:  CT abdomen pelvis 07/07/2016 FINDINGS: Nasogastric tube tip and side port overlie the gastric body. Mildly dilated small bowel in the central abdomen seen, consistent with findings of the earlier CT scan. IMPRESSION: NG tube tip and side port overlying the gastric body. Electronically Signed   By: Ulyses Jarred M.D.   On: 07/07/2016 21:30    Anti-infectives: Anti-infectives    Start     Dose/Rate Route Frequency Ordered Stop   07/08/16 1200  ceFAZolin (ANCEF) IVPB 2g/100 mL  premix     2 g 200 mL/hr over 30 Minutes Intravenous Every 8 hours 07/08/16 0950     07/03/16 1200  vancomycin (VANCOCIN) IVPB 1000 mg/200 mL premix  Status:  Discontinued     1,000 mg 200 mL/hr over 60 Minutes Intravenous Every 12 hours 07/03/16 0305 07/07/16 1636   07/03/16 1200  piperacillin-tazobactam (ZOSYN) IVPB 3.375 g  Status:  Discontinued     3.375 g 100 mL/hr over 30 Minutes Intravenous Every 6 hours 07/03/16 0951 07/03/16 0958   07/03/16 1200   piperacillin-tazobactam (ZOSYN) IVPB 3.375 g  Status:  Discontinued     3.375 g 12.5 mL/hr over 240 Minutes Intravenous Every 8 hours 07/03/16 1000 07/08/16 0950   07/03/16 1100  ceFEPIme (MAXIPIME) 1 g in dextrose 5 % 50 mL IVPB  Status:  Discontinued     1 g 100 mL/hr over 30 Minutes Intravenous Every 12 hours 07/03/16 0305 07/03/16 0951   07/03/16 0015  vancomycin (VANCOCIN) IVPB 1000 mg/200 mL premix     1,000 mg 200 mL/hr over 60 Minutes Intravenous  Once 07/03/16 0001 07/03/16 0306   07/03/16 0015  ceFEPIme (MAXIPIME) 2 g in dextrose 5 % 50 mL IVPB     2 g 100 mL/hr over 30 Minutes Intravenous  Once 07/03/16 0002 07/03/16 0152       Assessment/Plan 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 Continue NPO/NGT. Please ambulate patient multiple times today, ok to clamp NG for short period of time while walking. XR in AM   LOS: 6 days    Jerrye Beavers , [facility] Surgery 07/09/2016, 7:26 AM Pager: [phone] Consults: [phone] Mon-Fri 7:00 am-4:30 pm Sat-Sun 7:00 am-11:30 am

## 2016-07-09 NOTE — Progress Notes (Signed)
Patient passed 5 hard stools, after suppository.  Patient has walked in hall 3 times today.

## 2016-07-10 ENCOUNTER — Inpatient Hospital Stay (HOSPITAL_COMMUNITY): Payer: Medicare Other

## 2016-07-10 LAB — BASIC METABOLIC PANEL
Anion gap: 11 (ref 5–15)
BUN: 46 mg/dL — ABNORMAL HIGH (ref 6–20)
CALCIUM: 8.9 mg/dL (ref 8.9–10.3)
CO2: 25 mmol/L (ref 22–32)
CREATININE: 1.1 mg/dL (ref 0.61–1.24)
Chloride: 101 mmol/L (ref 101–111)
GFR calc Af Amer: >60 mL/min (ref >60)
GFR calc non Af Amer: >60 mL/min (ref >60)
GLUCOSE: 127 mg/dL — AB (ref 65–99)
Potassium: 3.2 mmol/L — ABNORMAL LOW (ref 3.5–5.1)
Sodium: 137 mmol/L (ref 135–145)

## 2016-07-10 LAB — CBC
HCT: 40.8 % (ref 39.0–52.0)
Hemoglobin: 14.2 g/dL (ref 13.0–17.0)
MCH: 29.7 pg (ref 26.0–34.0)
MCHC: 34.8 g/dL (ref 30.0–36.0)
MCV: 85.4 fL (ref 78.0–100.0)
PLATELETS: 439 10*3/uL — AB (ref 150–400)
RBC: 4.78 MIL/uL (ref 4.22–5.81)
RDW: 14.3 % (ref 11.5–15.5)
WBC: 20.1 10*3/uL — ABNORMAL HIGH (ref 4.0–10.5)

## 2016-07-10 LAB — GLUCOSE, CAPILLARY
GLUCOSE-CAPILLARY: 127 mg/dL — AB (ref 65–99)
Glucose-Capillary: 134 mg/dL — ABNORMAL HIGH (ref 65–99)
Glucose-Capillary: 132 mg/dL — ABNORMAL HIGH (ref 65–99)
Glucose-Capillary: 122 mg/dL — ABNORMAL HIGH (ref 65–99)
Glucose-Capillary: 106 mg/dL — ABNORMAL HIGH (ref 65–99)

## 2016-07-10 MED ORDER — SODIUM CHLORIDE 0.9% FLUSH
10.0000 mL | INTRAVENOUS | Status: DC | PRN
  Administered 2016-07-11 – 2016-07-14 (×5): 10 mL
  Filled 2016-07-10 (×5): qty 40

## 2016-07-10 MED ORDER — LORAZEPAM 2 MG/ML IJ SOLN
1.0000 mg | Freq: Every day | INTRAMUSCULAR | Status: DC
  Administered 2016-07-10 – 2016-07-14 (×5): 1 mg via INTRAVENOUS
  Filled 2016-07-10 (×6): qty 1

## 2016-07-10 MED ORDER — BISACODYL 10 MG RE SUPP
10.0000 mg | Freq: Once | RECTAL | Status: AC
  Administered 2016-07-10: 10 mg via RECTAL
  Filled 2016-07-10: qty 1

## 2016-07-10 MED ORDER — KCL IN DEXTROSE-NACL 40-5-0.45 MEQ/L-%-% IV SOLN
INTRAVENOUS | Status: AC
  Administered 2016-07-10 – 2016-07-11 (×4): via INTRAVENOUS
  Filled 2016-07-10 (×3): qty 1000

## 2016-07-10 NOTE — Progress Notes (Signed)
Peripherally Inserted Central Catheter/Midline Placement  The IV Nurse has discussed with the patient and/or persons authorized to consent for the patient, the purpose of this procedure and the potential benefits and risks involved with this procedure.  The benefits include less needle sticks, lab draws from the catheter, and the patient may be discharged home with the catheter. Risks include, but not limited to, infection, bleeding, blood clot (thrombus formation), and puncture of an artery; nerve damage and irregular heartbeat and possibility to perform a PICC exchange if needed/ordered by physician.  Alternatives to this procedure were also discussed.  Bard Power PICC patient education guide, fact sheet on infection prevention and patient information card has been provided to patient /or left at bedside.    PICC/Midline Placement Documentation  PICC Double Lumen 07/10/16 PICC Right Brachial 40 cm 0 cm (Active)  Indication for Insertion or Continuance of Line Administration of hyperosmolar/irritating solutions (i.e. TPN, Vancomycin, etc.) 07/10/2016  5:19 PM  Exposed Catheter (cm) 0 cm 07/10/2016  5:19 PM  Site Assessment Clean;Dry;Intact 07/10/2016  5:19 PM  Lumen #1 Status Flushed;Saline locked;Blood return noted 07/10/2016  5:19 PM  Lumen #2 Status Flushed;Saline locked;Blood return noted 07/10/2016  5:19 PM  Dressing Type Transparent 07/10/2016  5:19 PM  Dressing Status Clean;Dry;Intact;Antimicrobial disc in place 07/10/2016  5:19 PM  Line Care Connections checked and tightened 07/10/2016  5:19 PM  Line Adjustment (NICU/IV Team Only) No 07/10/2016  5:19 PM  Dressing Intervention New dressing 07/10/2016  5:19 PM  Dressing Change Due 07/17/16 07/10/2016  5:19 PM       Rolena Infante 07/10/2016, 5:20 PM

## 2016-07-10 NOTE — Progress Notes (Signed)
Subjective: Resting comfortably  Objective: Vital signs in last 24 hours: Temp:  [97.5 F (36.4 C)-98.2 F (36.8 C)] 98.2 F (36.8 C) (12/23 0411) Pulse Rate:  [99-108] 99 (12/23 0411) Resp:  [18] 18 (12/23 0411) BP: (121-129)/(69-96) 122/69 (12/23 0411) SpO2:  [98 %-100 %] 100 % (12/23 0411) Weight:  [78.5 kg (173 lb)] 78.5 kg (173 lb) (12/22 1509) Last BM Date: 07/09/16  Intake/Output from previous day: 12/22 0701 - 12/23 0700 In: 2490 [I.V.:1790; NG/GT:200; IV Piggyback:500] Out: 950 [Urine:150; Emesis/NG output:800] Intake/Output this shift: Total I/O In: -  Out: 280 [Urine:280]  Resp: clear to auscultation bilaterally Cardio: regular rate and rhythm GI: soft, nontender. large ventral hernia is soft  Lab Results:   Recent Labs  07/09/16 0524 07/10/16 0748  WBC 21.7* 20.1*  HGB 16.3 14.2  HCT 45.6 40.8  PLT 421* 439*   BMET  Recent Labs  07/09/16 0524 07/10/16 0748  NA 136 137  K 3.9 3.2*  CL 93* 101  CO2 32 25  GLUCOSE 160* 127*  BUN 51* 46*  CREATININE 1.82* 1.10  CALCIUM 9.9 8.9   PT/INR No results for input(s): LABPROT, INR in the last 72 hours. ABG No results for input(s): PHART, HCO3 in the last 72 hours.  Invalid input(s): PCO2, PO2  Studies/Results: Dg Abd 1 View  Result Date: 07/09/2016 CLINICAL DATA:  Small-bowel obstruction. EXAM: ABDOMEN - 1 VIEW COMPARISON:  07/08/2016.  CT 07/07/2016. FINDINGS: NG tube noted with tip projected over the upper stomach. Surgical clips upper abdomen. Dilated loops of small bowel noted suggesting small bowel obstruction. No free air. Similar findings noted on prior exam. IMPRESSION: 1. NG tube tip noted over the upper stomach . 2. Persistent small bowel dilatation consistent with persistent small-bowel obstruction. Similar findings noted on prior exams. Electronically Signed   By: Marcello Moores  Register   On: 07/09/2016 06:53   Dg Abd Portable 1v  Result Date: 07/10/2016 CLINICAL DATA:  Small bowel  obstruction EXAM: PORTABLE ABDOMEN - 1 VIEW COMPARISON:  07/09/2016 FINDINGS: Mildly dilated loops of small bowel in the right lower abdomen, compatible with the clinical history of small bowel obstruction. Surgical clips in the right mid/ upper abdomen. Degenerative changes of the lumbar spine. IMPRESSION: Mild without loops of small bowel in the right lower abdomen, compatible with the clinical history of small bowel obstruction. Electronically Signed   By: Julian Hy M.D.   On: 07/10/2016 08:07   Dg Abd Portable 1v  Result Date: 07/08/2016 CLINICAL DATA:  Enteric tube placement EXAM: PORTABLE ABDOMEN - 1 VIEW COMPARISON:  07/08/2016 abdominal radiograph FINDINGS: Enteric tube terminates in the proximal stomach with the side port near the esophagogastric junction. CABG clips overlie the mediastinum. Visualized median sternotomy wires are stable in configuration. Surgical clips are present in the right upper quadrant of the abdomen. No disproportionately dilated small bowel loops. No evidence of pneumatosis or pneumoperitoneum. Clear lung bases. IMPRESSION: Enteric tube terminates in the proximal stomach. Electronically Signed   By: Ilona Sorrel M.D.   On: 07/08/2016 17:57    Anti-infectives: Anti-infectives    Start     Dose/Rate Route Frequency Ordered Stop   07/08/16 1200  ceFAZolin (ANCEF) IVPB 2g/100 mL premix     2 g 200 mL/hr over 30 Minutes Intravenous Every 8 hours 07/08/16 0950     07/03/16 1200  vancomycin (VANCOCIN) IVPB 1000 mg/200 mL premix  Status:  Discontinued     1,000 mg 200 mL/hr over 60 Minutes Intravenous Every  12 hours 07/03/16 0305 07/07/16 1636   07/03/16 1200  piperacillin-tazobactam (ZOSYN) IVPB 3.375 g  Status:  Discontinued     3.375 g 100 mL/hr over 30 Minutes Intravenous Every 6 hours 07/03/16 0951 07/03/16 0958   07/03/16 1200  piperacillin-tazobactam (ZOSYN) IVPB 3.375 g  Status:  Discontinued     3.375 g 12.5 mL/hr over 240 Minutes Intravenous Every 8  hours 07/03/16 1000 07/08/16 0950   07/03/16 1100  ceFEPIme (MAXIPIME) 1 g in dextrose 5 % 50 mL IVPB  Status:  Discontinued     1 g 100 mL/hr over 30 Minutes Intravenous Every 12 hours 07/03/16 0305 07/03/16 0951   07/03/16 0015  vancomycin (VANCOCIN) IVPB 1000 mg/200 mL premix     1,000 mg 200 mL/hr over 60 Minutes Intravenous  Once 07/03/16 0001 07/03/16 0306   07/03/16 0015  ceFEPIme (MAXIPIME) 2 g in dextrose 5 % 50 mL IVPB     2 g 100 mL/hr over 30 Minutes Intravenous  Once 07/03/16 0002 07/03/16 0152      Assessment/Plan: s/p * No surgery found * continue ng and bowel rest  Continue abx for scrotal abscess  LOS: 7 days    TOTH III,Taylan Mayhan S 07/10/2016

## 2016-07-10 NOTE — Progress Notes (Signed)
PROGRESS NOTE  Dustin Dominguez ISHIMOTO  [id number] DOB: 17-Dec-1940 DOA: 07/02/2016 PCP: Redge Gainer, MD  Brief Narrative:   The patient is a 75 year old male with history of diabetes mellitus type 2, chronic pain, coronary artery disease, hypertension who presented to the emergency department with worsening pain and swelling of the scrotum was present for 3 weeks prior to admission. The area was draining purulent fluid and became acutely worse 4-5 days prior to admission. He underwent I&D of scrotal abscess by urology on 12/16 the cultures of which grew Klebsiella. Postoperatively, he developed severe constipation. Gastroenterology was consulted because the patient had little results with cathartics. Imaging demonstrated an SBO with a transition zone in general surgery was consulted. He had an NG tube placed to low intermittent suction and has been receiving laxatives for severe constipation.  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 - Scrotal culture negative thus far. Patient afebrile - Continue dressing changes as recommended per urology.  - Urology has since signed off as of 07/04/2016 with recommendations for follow-up in 10-14 days for wound check. - Continue cefazolin per sensitivities  SBO due to adhesions and constipation. The patient has had splenectomy, right adrenal mass excision and open cholecystectomy previously. - Appreciate gastroenterology and  general surgery assistance - NG to LIS - additional bisacodyl tonight  AKI due to inadequate IV fluids and ACE inhibitor.  Creatinine up to 1.8, baseline 0.7, improving with IVF - continue IV fluids -  Discontinue ACE inhibitor  diet-controlled diabetes mellitus type 2 - Hemoglobin A1c was 5.5 on 09/10/2015.  - continue low dose SSI  Hypertension, blood pressures mildly elevated -Continue to hold oral medications - Continue IV metoprolol -  Add when necessary hydralazine  coronary artery disease s/p CABG with last cath in April 2017 which showed severe 2 vessel disease but patent bypasses -remains asymptomatic - aspirin on hold due to SBO  Chronic pain and anxiety -  Patient is on MS Contin 30 mg by mouth 3 times a day, Percocet 10-3 25 6  times daily, and Valium 10 mg by mouth twice a day at home at baseline -  Continue morphine 2mg  IV q4h prn -  Continue ativan 0.5mg  prn -  Add nightly ativan to help with sleep  Severe protein calorie malnutrition -  PICC line with TNA -  Plan to start supplements once diet advanced  Hypokalemia, increase potassium in IVF  DVT prophylaxis:  Heparin Code Status:  Full code Family Communication:  Patient alone Disposition Plan:  Pending resolution of bowel obstruction   Consultants:   Urology  [facility], Dr. Ardis Hughs  General Surgery  Procedures:   Incision and drainage of scrotal wall abscess and Dr. Dahlstedt12/16/2017  Antimicrobials:  Anti-infectives    Start     Dose/Rate Route Frequency Ordered Stop   07/08/16 1200  ceFAZolin (ANCEF) IVPB 2g/100 mL premix     2 g 200 mL/hr over 30 Minutes Intravenous Every 8 hours 07/08/16 0950  07/03/16 1200  vancomycin (VANCOCIN) IVPB 1000 mg/200 mL premix  Status:  Discontinued     1,000 mg 200 mL/hr over 60 Minutes Intravenous Every 12 hours 07/03/16 0305 07/07/16 1636   07/03/16 1200  piperacillin-tazobactam (ZOSYN) IVPB 3.375 g  Status:  Discontinued      3.375 g 100 mL/hr over 30 Minutes Intravenous Every 6 hours 07/03/16 0951 07/03/16 0958   07/03/16 1200  piperacillin-tazobactam (ZOSYN) IVPB 3.375 g  Status:  Discontinued     3.375 g 12.5 mL/hr over 240 Minutes Intravenous Every 8 hours 07/03/16 1000 07/08/16 0950   07/03/16 1100  ceFEPIme (MAXIPIME) 1 g in dextrose 5 % 50 mL IVPB  Status:  Discontinued     1 g 100 mL/hr over 30 Minutes Intravenous Every 12 hours 07/03/16 0305 07/03/16 0951   07/03/16 0015  vancomycin (VANCOCIN) IVPB 1000 mg/200 mL premix     1,000 mg 200 mL/hr over 60 Minutes Intravenous  Once 07/03/16 0001 07/03/16 0306   07/03/16 0015  ceFEPIme (MAXIPIME) 2 g in dextrose 5 % 50 mL IVPB     2 g 100 mL/hr over 30 Minutes Intravenous  Once 07/03/16 0002 07/03/16 0152       Subjective: Small hard stools yesterday.  Passed flatus this morning but still having copious NG output   Objective: Vitals:   07/09/16 1518 07/09/16 2023 07/10/16 0411 07/10/16 1420  BP: (!) 129/96 121/74 122/69 131/81  Pulse: (!) 108 100 99 71  Resp: 18 18 18 18   Temp: 97.5 F (36.4 C) 97.8 F (36.6 C) 98.2 F (36.8 C) 97.5 F (36.4 C)  TempSrc: Oral Oral Oral Oral  SpO2: 98% 98% 100% 100%  Weight:      Height:        Intake/Output Summary (Last 24 hours) at 07/10/16 1544 Last data filed at 07/10/16 0916  Gross per 24 hour  Intake              400 ml  Output              730 ml  Net             -330 ml   Filed Weights   07/02/16 2023 07/03/16 0540 07/09/16 1509  Weight: 85.3 kg (188 lb) 87.4 kg (192 lb 10.9 oz) 78.5 kg (173 lb)    Examination:  General exam:  Adult Male.  No acute distress.  HEENT:  NCAT, MMM Respiratory system: Clear to auscultation bilaterally Cardiovascular system: Regular rate and rhythm, normal S1/S2. No murmurs, rubs, gallops or clicks.  Warm extremities Gastrointestinal system: Hypoactive bowel sounds, soft, distended over the right upper and lower quadrants, nontender. MSK:  Normal tone and  bulk, no lower extremity edema Neuro:  Grossly intact    Data Reviewed: I have personally reviewed following labs and imaging studies  CBC:  Recent Labs Lab 07/04/16 0907 07/05/16 0554  07/06/16 1030 07/07/16 0613 07/08/16 0539 07/09/16 0524 07/10/16 0748  WBC 16.2* 19.1*  < > 24.3* 24.7* 20.8* 21.7* 20.1*  NEUTROABS 8.5* 14.9*  --   --   --   --   --   --   HGB 12.0* 13.9  < > 14.3 14.2 14.6 16.3 14.2  HCT 34.9* 40.4  < > 40.3 41.5 42.5 45.6 40.8  MCV 86.4 86.0  < > 83.8 84.9 85.7 83.8 85.4  PLT 403* 448*  < > 431* 426* 428* 421* 439*  < > = values in this interval not displayed.  Basic Metabolic Panel:  Recent Labs Lab 07/05/16 0554 07/06/16 1030 07/08/16 0539 07/09/16 0524 07/10/16 0748  NA 136 137 135 136 137  K 3.5 4.1 3.5 3.9 3.2*  CL 100* 102 96* 93* 101  CO2 25 28 29  32 25  GLUCOSE 190* 141* 144* 160* 127*  BUN 15 19 28* 51* 46*  CREATININE 0.77 0.84 1.20 1.82* 1.10  CALCIUM 9.6 9.1 9.3 9.9 8.9  MG 0.9* 1.9  --   --   --    GFR: Estimated Creatinine Clearance: 63.7 mL/min (by C-G formula based on SCr of 1.1 mg/dL). Liver Function Tests: No results for input(s): AST, ALT, ALKPHOS, BILITOT, PROT, ALBUMIN in the last 168 hours. No results for input(s): LIPASE, AMYLASE in the last 168 hours. No results for input(s): AMMONIA in the last 168 hours. Coagulation Profile: No results for input(s): INR, PROTIME in the last 168 hours. Cardiac Enzymes: No results for input(s): CKTOTAL, CKMB, CKMBINDEX, TROPONINI in the last 168 hours. BNP (last 3 results) No results for input(s): PROBNP in the last 8760 hours. HbA1C: No results for input(s): HGBA1C in the last 72 hours. CBG:  Recent Labs Lab 07/09/16 2027 07/09/16 2342 07/10/16 0407 07/10/16 0731 07/10/16 1125  GLUCAP 155* 132* 134* 132* 127*   Lipid Profile: No results for input(s): CHOL, HDL, LDLCALC, TRIG, CHOLHDL, LDLDIRECT in the last 72 hours. Thyroid Function Tests: No results for input(s):  TSH, T4TOTAL, FREET4, T3FREE, THYROIDAB in the last 72 hours. Anemia Panel: No results for input(s): VITAMINB12, FOLATE, FERRITIN, TIBC, IRON, RETICCTPCT in the last 72 hours. Urine analysis:    Component Value Date/Time   COLORURINE YELLOW 07/02/2016 2134   APPEARANCEUR CLEAR 07/02/2016 2134   APPEARANCEUR Cloudy (A) 06/16/2016 1131   LABSPEC 1.021 07/02/2016 2134   PHURINE 5.0 07/02/2016 2134   GLUCOSEU NEGATIVE 07/02/2016 2134   HGBUR SMALL (A) 07/02/2016 2134   BILIRUBINUR NEGATIVE 07/02/2016 2134   BILIRUBINUR Negative 06/16/2016 [place] 07/02/2016 2134   PROTEINUR NEGATIVE 07/02/2016 2134   NITRITE NEGATIVE 07/02/2016 2134   LEUKOCYTESUR TRACE (A) 07/02/2016 2134   LEUKOCYTESUR 3+ (A) 06/16/2016 1131   Sepsis Labs: @LABRCNTIP (procalcitonin:4,lacticidven:4)  ) Recent Results (from the past 240 hour(s))  Urine culture     Status: Abnormal   Collection Time: 07/02/16  9:33 PM  Result Value Ref Range Status   Specimen Description URINE, CLEAN CATCH  Final   Special Requests NONE  Final   Culture (A)  Final    60,000 COLONIES/mL STAPHYLOCOCCUS SPECIES (COAGULASE NEGATIVE)   Report Status 07/05/2016 FINAL  Final   Organism ID, Bacteria STAPHYLOCOCCUS SPECIES (COAGULASE NEGATIVE) (A)  Final      Susceptibility   Staphylococcus species (coagulase negative) - MIC*    CIPROFLOXACIN >=8 RESISTANT Resistant     GENTAMICIN <=0.5 SENSITIVE Sensitive     NITROFURANTOIN <=16 SENSITIVE Sensitive     OXACILLIN >=4 RESISTANT Resistant     TETRACYCLINE 2 SENSITIVE Sensitive     VANCOMYCIN 1 SENSITIVE Sensitive     TRIMETH/SULFA 80 RESISTANT Resistant     CLINDAMYCIN <=0.25 SENSITIVE Sensitive     RIFAMPIN <=0.5 SENSITIVE Sensitive     Inducible Clindamycin NEGATIVE Sensitive     * 60,000 COLONIES/mL STAPHYLOCOCCUS SPECIES (COAGULASE NEGATIVE)  Blood culture (routine x 2)     Status: None   Collection Time: 07/03/16 12:25 AM  Result Value Ref Range Status    Specimen Description BLOOD LEFT ARM  Final   Special Requests BOTTLES DRAWN  AEROBIC AND ANAEROBIC 5CC  Final   Culture NO GROWTH 5 DAYS  Final   Report Status 07/08/2016 FINAL  Final  Blood culture (routine x 2)     Status: None   Collection Time: 07/03/16 12:31 AM  Result Value Ref Range Status   Specimen Description BLOOD RIGHT HAND  Final   Special Requests BOTTLES DRAWN AEROBIC AND ANAEROBIC 5CC  Final   Culture NO GROWTH 5 DAYS  Final   Report Status 07/08/2016 FINAL  Final  Aerobic Culture (superficial specimen)     Status: None   Collection Time: 07/03/16 10:58 AM  Result Value Ref Range Status   Specimen Description ABSCESS SCROTUM  Final   Special Requests NORMAL  Final   Gram Stain   Final    RARE WBC PRESENT, PREDOMINANTLY PMN NO ORGANISMS SEEN Performed at [facility]  Final   Report Status 07/08/2016 FINAL  Final   Organism ID, Bacteria KLEBSIELLA PNEUMONIAE  Final      Susceptibility   Klebsiella pneumoniae - MIC*    AMPICILLIN >=32 RESISTANT Resistant     CEFAZOLIN <=4 SENSITIVE Sensitive     CEFEPIME <=1 SENSITIVE Sensitive     CEFTAZIDIME <=1 SENSITIVE Sensitive     CEFTRIAXONE <=1 SENSITIVE Sensitive     CIPROFLOXACIN >=4 RESISTANT Resistant     GENTAMICIN <=1 SENSITIVE Sensitive     IMIPENEM 0.5 SENSITIVE Sensitive     TRIMETH/SULFA <=20 SENSITIVE Sensitive     AMPICILLIN/SULBACTAM 16 INTERMEDIATE Intermediate     PIP/TAZO 16 SENSITIVE Sensitive     Extended ESBL NEGATIVE Sensitive     * RARE KLEBSIELLA PNEUMONIAE      Radiology Studies: Dg Abd 1 View  Result Date: 07/09/2016 CLINICAL DATA:  Small-bowel obstruction. EXAM: ABDOMEN - 1 VIEW COMPARISON:  07/08/2016.  CT 07/07/2016. FINDINGS: NG tube noted with tip projected over the upper stomach. Surgical clips upper abdomen. Dilated loops of small bowel noted suggesting small bowel obstruction. No free air. Similar findings noted on prior exam. IMPRESSION:  1. NG tube tip noted over the upper stomach . 2. Persistent small bowel dilatation consistent with persistent small-bowel obstruction. Similar findings noted on prior exams. Electronically Signed   By: Marcello Moores  Register   On: 07/09/2016 06:53   Dg Abd Portable 1v  Result Date: 07/10/2016 CLINICAL DATA:  Small bowel obstruction EXAM: PORTABLE ABDOMEN - 1 VIEW COMPARISON:  07/09/2016 FINDINGS: Mildly dilated loops of small bowel in the right lower abdomen, compatible with the clinical history of small bowel obstruction. Surgical clips in the right mid/ upper abdomen. Degenerative changes of the lumbar spine. IMPRESSION: Mild without loops of small bowel in the right lower abdomen, compatible with the clinical history of small bowel obstruction. Electronically Signed   By: Julian Hy M.D.   On: 07/10/2016 08:07   Dg Abd Portable 1v  Result Date: 07/08/2016 CLINICAL DATA:  Enteric tube placement EXAM: PORTABLE ABDOMEN - 1 VIEW COMPARISON:  07/08/2016 abdominal radiograph FINDINGS: Enteric tube terminates in the proximal stomach with the side port near the esophagogastric junction. CABG clips overlie the mediastinum. Visualized median sternotomy wires are stable in configuration. Surgical clips are present in the right upper quadrant of the abdomen. No disproportionately dilated small bowel loops. No evidence of pneumatosis or pneumoperitoneum. Clear lung bases. IMPRESSION: Enteric tube terminates in the proximal stomach. Electronically Signed   By: Ilona Sorrel M.D.   On: 07/08/2016 17:57     Scheduled  Meds: .  ceFAZolin (ANCEF) IV  2 g Intravenous Q8H  . heparin subcutaneous  5,000 Units Subcutaneous Q8H  . Influenza vac split quadrivalent PF  0.5 mL Intramuscular Tomorrow-1000  . insulin aspart  0-9 Units Subcutaneous Q4H  . LORazepam  1 mg Intravenous QHS  . metoprolol  5 mg Intravenous Q6H  . pantoprazole (PROTONIX) IV  40 mg Intravenous Q12H   Continuous Infusions: . dextrose 5 % and  0.45 % NaCl with KCl 40 mEq/L       LOS: 7 days    Time spent: 30 min    Dustin Canterbury, MD [facility] Pager [phone]  If 7PM-7AM, please contact night-coverage [email or web address] Password [facility] 07/10/2016, 3:44 PM

## 2016-07-10 NOTE — Progress Notes (Signed)
Pharmacy - TPN  Assessment: Patient ordered TPN; PICC unable to be placed until tomorrow, 12/24  Plan: have ordered labs for tomorrow; f/u dietician recommendations for TPN  Dolly Rias RPh 07/10/2016, 4:09 PM Pager [phone]

## 2016-07-11 ENCOUNTER — Inpatient Hospital Stay (HOSPITAL_COMMUNITY): Payer: Medicare Other

## 2016-07-11 DIAGNOSIS — R1084 Generalized abdominal pain: Secondary | ICD-10-CM

## 2016-07-11 LAB — CBC
HCT: 36.5 % — ABNORMAL LOW (ref 39.0–52.0)
Hemoglobin: 12.8 g/dL — ABNORMAL LOW (ref 13.0–17.0)
MCH: 29.8 pg (ref 26.0–34.0)
MCHC: 35.1 g/dL (ref 30.0–36.0)
MCV: 85.1 fL (ref 78.0–100.0)
PLATELETS: 388 10*3/uL (ref 150–400)
RBC: 4.29 MIL/uL (ref 4.22–5.81)
RDW: 14.4 % (ref 11.5–15.5)
WBC: 17.3 10*3/uL — AB (ref 4.0–10.5)

## 2016-07-11 LAB — COMPREHENSIVE METABOLIC PANEL
ALT: 10 U/L — AB (ref 17–63)
AST: 26 U/L (ref 15–41)
Albumin: 2.8 g/dL — ABNORMAL LOW (ref 3.5–5.0)
Alkaline Phosphatase: 41 U/L (ref 38–126)
Anion gap: 8 (ref 5–15)
BUN: 34 mg/dL — ABNORMAL HIGH (ref 6–20)
CHLORIDE: 103 mmol/L (ref 101–111)
CO2: 25 mmol/L (ref 22–32)
CREATININE: 0.8 mg/dL (ref 0.61–1.24)
Calcium: 8.4 mg/dL — ABNORMAL LOW (ref 8.9–10.3)
GFR calc non Af Amer: >60 mL/min (ref >60)
Glucose, Bld: 130 mg/dL — ABNORMAL HIGH (ref 65–99)
POTASSIUM: 3.4 mmol/L — AB (ref 3.5–5.1)
SODIUM: 136 mmol/L (ref 135–145)
Total Bilirubin: 0.5 mg/dL (ref 0.3–1.2)
Total Protein: 5.6 g/dL — ABNORMAL LOW (ref 6.5–8.1)

## 2016-07-11 LAB — DIFFERENTIAL
BASOS ABS: 0 10*3/uL (ref 0.0–0.1)
BASOS PCT: 0 %
EOS ABS: 0.7 10*3/uL (ref 0.0–0.7)
Eosinophils Relative: 4 %
LYMPHS ABS: 4.2 10*3/uL — AB (ref 0.7–4.0)
LYMPHS PCT: 24 %
MONO ABS: 1.9 10*3/uL — AB (ref 0.1–1.0)
Monocytes Relative: 11 %
NEUTROS ABS: 10.5 10*3/uL — AB (ref 1.7–7.7)
NEUTROS PCT: 61 %

## 2016-07-11 LAB — PREALBUMIN: PREALBUMIN: 22 mg/dL (ref 18–38)

## 2016-07-11 LAB — GLUCOSE, CAPILLARY
GLUCOSE-CAPILLARY: 143 mg/dL — AB (ref 65–99)
Glucose-Capillary: 111 mg/dL — ABNORMAL HIGH (ref 65–99)
Glucose-Capillary: 114 mg/dL — ABNORMAL HIGH (ref 65–99)
Glucose-Capillary: 115 mg/dL — ABNORMAL HIGH (ref 65–99)
Glucose-Capillary: 117 mg/dL — ABNORMAL HIGH (ref 65–99)
Glucose-Capillary: 128 mg/dL — ABNORMAL HIGH (ref 65–99)

## 2016-07-11 LAB — TRIGLYCERIDES: TRIGLYCERIDES: 163 mg/dL — AB (ref <150)

## 2016-07-11 LAB — PHOSPHORUS: Phosphorus: 1.4 mg/dL — ABNORMAL LOW (ref 2.5–4.6)

## 2016-07-11 LAB — MAGNESIUM: MAGNESIUM: 1.5 mg/dL — AB (ref 1.7–2.4)

## 2016-07-11 MED ORDER — TRACE MINERALS CR-CU-MN-SE-ZN 10-1000-500-60 MCG/ML IV SOLN
INTRAVENOUS | Status: AC
  Administered 2016-07-11: 17:00:00 via INTRAVENOUS
  Filled 2016-07-11: qty 960

## 2016-07-11 MED ORDER — BISACODYL 10 MG RE SUPP
10.0000 mg | Freq: Once | RECTAL | Status: AC
  Administered 2016-07-11: 10 mg via RECTAL
  Filled 2016-07-11: qty 1

## 2016-07-11 MED ORDER — MAGNESIUM SULFATE 2 GM/50ML IV SOLN
2.0000 g | Freq: Once | INTRAVENOUS | Status: AC
  Administered 2016-07-11: 2 g via INTRAVENOUS
  Filled 2016-07-11: qty 50

## 2016-07-11 MED ORDER — KCL IN DEXTROSE-NACL 40-5-0.45 MEQ/L-%-% IV SOLN
INTRAVENOUS | Status: DC
  Administered 2016-07-11 – 2016-07-15 (×5): via INTRAVENOUS
  Filled 2016-07-11 (×8): qty 1000

## 2016-07-11 MED ORDER — POTASSIUM PHOSPHATES 15 MMOLE/5ML IV SOLN
30.0000 mmol | Freq: Once | INTRAVENOUS | Status: AC
  Administered 2016-07-11: 30 mmol via INTRAVENOUS
  Filled 2016-07-11: qty 10

## 2016-07-11 MED ORDER — POTASSIUM CHLORIDE 10 MEQ/100ML IV SOLN: 10.0000 meq | INTRAVENOUS | Status: DC

## 2016-07-11 MED ORDER — FAT EMULSION 20 % IV EMUL
250.0000 mL | INTRAVENOUS | Status: AC
Start: 2016-07-11 — End: 2016-07-12
  Administered 2016-07-11: 250 mL via INTRAVENOUS
  Filled 2016-07-11: qty 250

## 2016-07-11 NOTE — Progress Notes (Signed)
[facility] NOTE   Pharmacy Consult for TPN Indication: prolonged ileus  Patient Measurements: Height: 5\' 11"  (180.3 cm) Weight: 173 lb (78.5 kg) (bed scale ) IBW/kg (Calculated) : 75.3 TPN AdjBW (KG): 87.4 Body mass index is 24.13 kg/m. Usual Weight: 87kg   Insulin Requirements: 3 units yesterday ( hx of diabetes type 2, controlled with diet and glimepiride at home)  Current Nutrition: NPO  IVF: D5 1/2 NS w/ 71meq KCl/L @ 164ml/hr  Central access: PICC placed 12/23 TPN start date: 12/24  ASSESSMENT                                                                                                          HPI:  75 year old male with history of diabetes mellitus type 2, chronic pain, coronary artery disease, hypertension who presented to the ED with worsening pain and swelling of the scrotum was present for 3 weeks prior to admission.  He underwent I&D.  Postoperatively he developed severe constipation and SBO. Significant events:   Today:    Glucose - <150  Electrolytes - K 3.4, Phos 1.4, Mag 1.5, CCa WNL  Renal - WNL  LFTs - WNL  TGs - pending  Prealbumin - pending  NUTRITIONAL GOALS                                                                                             RD recs: 2000-2400 Kcal/day,  110-125g protein/day Clinimix 5/15 at a goal rate of 73ml/hr + 20% fat emulsion at 71ml/hr to provide: 99.6 g/day protein, 1912 Kcal/day.  ++Due to national  Backorder clinimix 5/15 (without electrolytes) is the only product [facility] has++  PLAN                                                                                           KPhos 64mmol IV x 1 Mag Sulfate 2gm IV x 1                              At 1800 today:  Start Clinimix 5/15 at 40 ml/hr. (No Electrolyte containing formula available)  20% fat emulsion at 10 ml/hr.  Plan to advance as tolerated to the goal rate.  TPN to contain standard multivitamins and trace  elements.  Reduce IVF to 61ml/hr.  Continue sensitive SSI .   TPN lab panels on Mondays & Thursdays.  F/u daily>pt is high risk for refeeding syndrome  Dolly Rias RPh 07/11/2016, 8:52 AM Pager [phone]

## 2016-07-11 NOTE — Progress Notes (Signed)
PROGRESS NOTE  Dustin Dominguez  [id number] DOB: [birth year] DOA: 07/02/2016 PCP: Redge Gainer, MD  Brief Narrative:   The patient is a 75 year old male with history of diabetes mellitus type 2, chronic pain, coronary artery disease, hypertension who presented to the emergency department with worsening pain and swelling of the scrotum was present for 3 weeks prior to admission. The area was draining purulent fluid and became acutely worse 4-5 days prior to admission. He underwent I&D of scrotal abscess by urology on 12/16 the cultures of which grew Klebsiella. Postoperatively, he developed severe constipation. Gastroenterology was consulted because the patient had little results with cathartics. Imaging demonstrated an SBO with a transition zone in general surgery was consulted. He had an NG tube placed to low intermittent suction and has been receiving laxatives for severe constipation.  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 - Scrotal culture negative thus far. Patient afebrile - Continue dressing changes as recommended per urology.  - Urology has since signed off as of 07/04/2016 with recommendations for follow-up in 10-14 days for wound check. - Continue cefazolin per sensitivities  SBO due to adhesions and constipation. The patient has had splenectomy, right adrenal mass excision and open cholecystectomy previously. - Appreciate gastroenterology and  general surgery assistance -  KUB:  Ongoing partial bowel obstruction -  Continue NG to LIS -  PICC with TPN - additional bisacodyl today  AKI due to inadequate IV fluids and ACE inhibitor.  Creatinine up to 1.8, baseline 0.7, resolved with IVF  diet-controlled diabetes mellitus type 2 - Hemoglobin A1c was 5.5 on 09/10/2015.  - continue low dose SSI  Hypertension, blood pressures mildly elevated -Continue to hold oral medications - Continue IV metoprolol -  Add when necessary hydralazine  coronary artery disease s/p CABG with last cath in April 2017 which showed severe 2 vessel disease but patent bypasses -remains asymptomatic - aspirin on hold due to SBO  Chronic pain and anxiety -  Patient is on MS Contin 30 mg by mouth 3 times a day, Percocet 10-3 25 6  times daily, and Valium 10 mg by mouth twice a day at home at baseline -  Continue morphine 2mg  IV q4h prn -  Continue ativan 0.5mg  prn -  Continue nightly ativan to help with sleep  Severe protein calorie malnutrition -  PICC line with TNA -  Plan to start supplements once diet advanced  Hypokalemia, continue potassium in IVF.    Hypomagnesemia and hypophosphatemia:  IV magnesium sulfate and sodium phosphate   DVT prophylaxis:  Heparin Code Status:  Full code Family Communication:  Patient alone Disposition Plan:  Pending resolution of bowel obstruction   Consultants:   Urology  [facility], Dr. Ardis Hughs  General Surgery  Procedures:   Incision and drainage of scrotal wall abscess and Dr. Dahlstedt12/16/2017  Antimicrobials:  Anti-infectives    Start     Dose/Rate Route Frequency Ordered Stop   07/08/16 1200  ceFAZolin (ANCEF) IVPB 2g/100  mL premix     2 g 200 mL/hr over 30 Minutes Intravenous Every 8 hours 07/08/16 0950     07/03/16 1200  vancomycin (VANCOCIN) IVPB 1000 mg/200 mL premix  Status:  Discontinued     1,000 mg 200 mL/hr over 60 Minutes Intravenous Every 12 hours 07/03/16 0305  07/07/16 1636   07/03/16 1200  piperacillin-tazobactam (ZOSYN) IVPB 3.375 g  Status:  Discontinued     3.375 g 100 mL/hr over 30 Minutes Intravenous Every 6 hours 07/03/16 0951 07/03/16 0958   07/03/16 1200  piperacillin-tazobactam (ZOSYN) IVPB 3.375 g  Status:  Discontinued     3.375 g 12.5 mL/hr over 240 Minutes Intravenous Every 8 hours 07/03/16 1000 07/08/16 0950   07/03/16 1100  ceFEPIme (MAXIPIME) 1 g in dextrose 5 % 50 mL IVPB  Status:  Discontinued     1 g 100 mL/hr over 30 Minutes Intravenous Every 12 hours 07/03/16 0305 07/03/16 0951   07/03/16 0015  vancomycin (VANCOCIN) IVPB 1000 mg/200 mL premix     1,000 mg 200 mL/hr over 60 Minutes Intravenous  Once 07/03/16 0001 07/03/16 0306   07/03/16 0015  ceFEPIme (MAXIPIME) 2 g in dextrose 5 % 50 mL IVPB     2 g 100 mL/hr over 30 Minutes Intravenous  Once 07/03/16 0002 07/03/16 0152       Subjective: Larger hard stools after suppository yesterday.  Still having copious NG output.  Having difficulty sleeping   Objective: Vitals:   07/10/16 2031 07/11/16 0400 07/11/16 0620 07/11/16 1331  BP: (!) 173/60 (!) 186/74 (!) 162/69 (!) 167/69  Pulse: 63 61 74 63  Resp: 18 18  18   Temp: 97.7 F (36.5 C) 97.7 F (36.5 C)  97.7 F (36.5 C)  TempSrc: Oral Oral  Axillary  SpO2: 100% 100%  99%  Weight:      Height:        Intake/Output Summary (Last 24 hours) at 07/11/16 1630 Last data filed at 07/11/16 1133  Gross per 24 hour  Intake           2937.5 ml  Output             1600 ml  Net           1337.5 ml   Filed Weights   07/02/16 2023 07/03/16 0540 07/09/16 1509  Weight: 85.3 kg (188 lb) 87.4 kg (192 lb 10.9 oz) 78.5 kg (173 lb)    Examination:  General exam:  Adult Male.  No acute distress.   HEENT:  NCAT, MMM Respiratory system: Clear to auscultation bilaterally Cardiovascular system: Regular rate and rhythm, normal S1/S2. No murmurs, rubs, gallops or clicks.  Warm extremities Gastrointestinal system: Hypoactive  bowel sounds, soft, still distended over the right upper and lower quadrants, nontender. MSK:  Normal tone and bulk, no lower extremity edema Neuro:  Grossly intact    Data Reviewed: I have personally reviewed following labs and imaging studies  CBC:  Recent Labs Lab 07/05/16 0554  07/07/16 0613 07/08/16 0539 07/09/16 0524 07/10/16 0748 07/11/16 0610  WBC 19.1*  < > 24.7* 20.8* 21.7* 20.1* 17.3*  NEUTROABS 14.9*  --   --   --   --   --  10.5*  HGB 13.9  < > 14.2 14.6 16.3 14.2 12.8*  HCT 40.4  < > 41.5 42.5 45.6 40.8 36.5*  MCV 86.0  < > 84.9 85.7 83.8 85.4 85.1  PLT 448*  < > 426* 428* 421* 439* 388  < > =  values in this interval not displayed. Basic Metabolic Panel:  Recent Labs Lab 07/05/16 0554 07/06/16 1030 07/08/16 0539 07/09/16 0524 07/10/16 0748 07/11/16 0610  NA 136 137 135 136 137 136  K 3.5 4.1 3.5 3.9 3.2* 3.4*  CL 100* 102 96* 93* 101 103  CO2 25 28 29  32 25 25  GLUCOSE 190* 141* 144* 160* 127* 130*  BUN 15 19 28* 51* 46* 34*  CREATININE 0.77 0.84 1.20 1.82* 1.10 0.80  CALCIUM 9.6 9.1 9.3 9.9 8.9 8.4*  MG 0.9* 1.9  --   --   --  1.5*  PHOS  --   --   --   --   --  1.4*   GFR: Estimated Creatinine Clearance: 87.6 mL/min (by C-G formula based on SCr of 0.8 mg/dL). Liver Function Tests:  Recent Labs Lab 07/11/16 0610  AST 26  ALT 10*  ALKPHOS 41  BILITOT 0.5  PROT 5.6*  ALBUMIN 2.8*   No results for input(s): LIPASE, AMYLASE in the last 168 hours. No results for input(s): AMMONIA in the last 168 hours. Coagulation Profile: No results for input(s): INR, PROTIME in the last 168 hours. Cardiac Enzymes: No results for input(s): CKTOTAL, CKMB, CKMBINDEX, TROPONINI in the last 168 hours. BNP (last 3 results) No results for input(s): PROBNP in the last 8760 hours. HbA1C: No results for input(s): HGBA1C in the last 72 hours. CBG:  Recent Labs Lab 07/10/16 2026 07/11/16 0051 07/11/16 0351 07/11/16 0753 07/11/16 1127  GLUCAP 106* 114*  115* 117* 143*   Lipid Profile:  Recent Labs  07/11/16 0609  TRIG 163*   Thyroid Function Tests: No results for input(s): TSH, T4TOTAL, FREET4, T3FREE, THYROIDAB in the last 72 hours. Anemia Panel: No results for input(s): VITAMINB12, FOLATE, FERRITIN, TIBC, IRON, RETICCTPCT in the last 72 hours. Urine analysis:    Component Value Date/Time   COLORURINE YELLOW 07/02/2016 2134   APPEARANCEUR CLEAR 07/02/2016 2134   APPEARANCEUR Cloudy (A) 06/16/2016 1131   LABSPEC 1.021 07/02/2016 2134   PHURINE 5.0 07/02/2016 2134   GLUCOSEU NEGATIVE 07/02/2016 2134   HGBUR SMALL (A) 07/02/2016 2134   BILIRUBINUR NEGATIVE 07/02/2016 2134   BILIRUBINUR Negative 06/16/2016 1131   KETONESUR NEGATIVE 07/02/2016 2134   PROTEINUR NEGATIVE 07/02/2016 2134   NITRITE NEGATIVE 07/02/2016 2134   LEUKOCYTESUR TRACE (A) 07/02/2016 2134   LEUKOCYTESUR 3+ (A) 06/16/2016 1131   Sepsis Labs: @LABRCNTIP (procalcitonin:4,lacticidven:4)  ) Recent Results (from the past 240 hour(s))  Urine culture     Status: Abnormal   Collection Time: 07/02/16  9:33 PM  Result Value Ref Range Status   Specimen Description URINE, CLEAN CATCH  Final   Special Requests NONE  Final   Culture (A)  Final    60,000 COLONIES/mL STAPHYLOCOCCUS SPECIES (COAGULASE NEGATIVE)   Report Status 07/05/2016 FINAL  Final   Organism ID, Bacteria STAPHYLOCOCCUS SPECIES (COAGULASE NEGATIVE) (A)  Final      Susceptibility   Staphylococcus species (coagulase negative) - MIC*    CIPROFLOXACIN >=8 RESISTANT Resistant     GENTAMICIN <=0.5 SENSITIVE Sensitive     NITROFURANTOIN <=16 SENSITIVE Sensitive     OXACILLIN >=4 RESISTANT Resistant     TETRACYCLINE 2 SENSITIVE Sensitive     VANCOMYCIN 1 SENSITIVE Sensitive     TRIMETH/SULFA 80 RESISTANT Resistant     CLINDAMYCIN <=0.25 SENSITIVE Sensitive     RIFAMPIN <=0.5 SENSITIVE Sensitive     Inducible Clindamycin NEGATIVE Sensitive     * 60,000 COLONIES/mL STAPHYLOCOCCUS SPECIES (COAGULASE  NEGATIVE)  Blood culture (routine x 2)     Status: None   Collection Time: 07/03/16 12:25 AM  Result Value Ref Range Status   Specimen Description BLOOD LEFT ARM  Final   Special Requests BOTTLES DRAWN AEROBIC AND ANAEROBIC 5CC  Final   Culture NO GROWTH 5 DAYS  Final   Report Status 07/08/2016 FINAL  Final  Blood culture (routine x 2)     Status: None   Collection Time: 07/03/16 12:31 AM  Result Value Ref Range Status   Specimen Description BLOOD RIGHT HAND  Final   Special Requests BOTTLES DRAWN AEROBIC AND ANAEROBIC 5CC  Final   Culture NO GROWTH 5 DAYS  Final   Report Status 07/08/2016 FINAL  Final  Aerobic Culture (superficial specimen)     Status: None   Collection Time: 07/03/16 10:58 AM  Result Value Ref Range Status   Specimen Description ABSCESS SCROTUM  Final   Special Requests NORMAL  Final   Gram Stain   Final    RARE WBC PRESENT, PREDOMINANTLY PMN NO ORGANISMS SEEN Performed at [place]  Final   Report Status 07/08/2016 FINAL  Final   Organism ID, Bacteria KLEBSIELLA PNEUMONIAE  Final      Susceptibility   Klebsiella pneumoniae - MIC*    AMPICILLIN >=32 RESISTANT Resistant     CEFAZOLIN <=4 SENSITIVE Sensitive     CEFEPIME <=1 SENSITIVE Sensitive     CEFTAZIDIME <=1 SENSITIVE Sensitive     CEFTRIAXONE <=1 SENSITIVE Sensitive     CIPROFLOXACIN >=4 RESISTANT Resistant     GENTAMICIN <=1 SENSITIVE Sensitive     IMIPENEM 0.5 SENSITIVE Sensitive     TRIMETH/SULFA <=20 SENSITIVE Sensitive     AMPICILLIN/SULBACTAM 16 INTERMEDIATE Intermediate     PIP/TAZO 16 SENSITIVE Sensitive     Extended ESBL NEGATIVE Sensitive     * RARE KLEBSIELLA PNEUMONIAE      Radiology Studies: Dg Abd 2 Views  Result Date: 07/11/2016 CLINICAL DATA:  Small bowel obstruction EXAM: ABDOMEN - 2 VIEW COMPARISON:  07/10/2016, 07/09/2016 FINDINGS: Several loops of dilated small bowel again noted without significant interval change. There is  air and contrast within the colon. Air in the rectum noted. Upright exam demonstrates scattered air-fluid levels in the small bowel and colon. Appearance compatible with residual persistent partial small bowel obstruction. No free air evident. IMPRESSION: Persistent small bowel dilatation and air-fluid levels but air and contrast within the colon compatible with persistent partial obstruction pattern. Electronically Signed   By: Jerilynn Mages.  Shick M.D.   On: 07/11/2016 10:34   Dg Abd Portable 1v  Result Date: 07/10/2016 CLINICAL DATA:  Small bowel obstruction EXAM: PORTABLE ABDOMEN - 1 VIEW COMPARISON:  07/09/2016 FINDINGS: Mildly dilated loops of small bowel in the right lower abdomen, compatible with the clinical history of small bowel obstruction. Surgical clips in the right mid/ upper abdomen. Degenerative changes of the lumbar spine. IMPRESSION: Mild without loops of small bowel in the right lower abdomen, compatible with the clinical history of small bowel obstruction. Electronically Signed   By: Julian Hy M.D.   On: 07/10/2016 08:07     Scheduled Meds: .  ceFAZolin (ANCEF) IV  2 g Intravenous Q8H  . heparin subcutaneous  5,000 Units Subcutaneous Q8H  . Influenza vac split quadrivalent PF  0.5 mL Intramuscular Tomorrow-1000  . insulin aspart  0-9 Units Subcutaneous Q4H  . LORazepam  1 mg Intravenous QHS  . metoprolol  5 mg Intravenous Q6H  . pantoprazole (PROTONIX) IV  40 mg Intravenous Q12H   Continuous Infusions: . dextrose 5 % and 0.45 % NaCl with KCl 40 mEq/L 125 mL/hr at 07/11/16 1457  . dextrose 5 % and 0.45 % NaCl with KCl 40 mEq/L    . TPN (CLINIMIX) Adult without lytes     And  . fat emulsion       LOS: 8 days    Time spent: 30 min    Janece Canterbury, MD [facility] Pager [phone]  If 7PM-7AM, please contact night-coverage [email or web address] Password [email or web address] 07/11/2016, 4:30 PM

## 2016-07-11 NOTE — Progress Notes (Signed)
Subjective: No complaints. Feels better. Passing some flatus and had bm with enema  Objective: Vital signs in last 24 hours: Temp:  [97.5 F (36.4 C)-97.7 F (36.5 C)] 97.7 F (36.5 C) (12/24 0400) Pulse Rate:  [61-74] 74 (12/24 0620) Resp:  [18] 18 (12/24 0400) BP: (131-186)/(60-81) 162/69 (12/24 0620) SpO2:  [100 %] 100 % (12/24 0400) Last BM Date: 07/10/16  Intake/Output from previous day: 12/23 0701 - 12/24 0700 In: 2937.5 [I.V.:2937.5] Out: 1580 [Urine:930; Emesis/NG output:650] Intake/Output this shift: No intake/output data recorded.  Resp: clear to auscultation bilaterally Cardio: regular rate and rhythm GI: soft, nontender. large ventral hernia soft and stable  Lab Results:   Recent Labs  07/10/16 0748 07/11/16 0610  WBC 20.1* 17.3*  HGB 14.2 12.8*  HCT 40.8 36.5*  PLT 439* 388   BMET  Recent Labs  07/10/16 0748 07/11/16 0610  NA 137 136  K 3.2* 3.4*  CL 101 103  CO2 25 25  GLUCOSE 127* 130*  BUN 46* 34*  CREATININE 1.10 0.80  CALCIUM 8.9 8.4*   PT/INR No results for input(s): LABPROT, INR in the last 72 hours. ABG No results for input(s): PHART, HCO3 in the last 72 hours.  Invalid input(s): PCO2, PO2  Studies/Results: Dg Abd Portable 1v  Result Date: 07/10/2016 CLINICAL DATA:  Small bowel obstruction EXAM: PORTABLE ABDOMEN - 1 VIEW COMPARISON:  07/09/2016 FINDINGS: Mildly dilated loops of small bowel in the right lower abdomen, compatible with the clinical history of small bowel obstruction. Surgical clips in the right mid/ upper abdomen. Degenerative changes of the lumbar spine. IMPRESSION: Mild without loops of small bowel in the right lower abdomen, compatible with the clinical history of small bowel obstruction. Electronically Signed   By: Julian Hy M.D.   On: 07/10/2016 08:07    Anti-infectives: Anti-infectives    Start     Dose/Rate Route Frequency Ordered Stop   07/08/16 1200  ceFAZolin (ANCEF) IVPB 2g/100 mL premix      2 g 200 mL/hr over 30 Minutes Intravenous Every 8 hours 07/08/16 0950     07/03/16 1200  vancomycin (VANCOCIN) IVPB 1000 mg/200 mL premix  Status:  Discontinued     1,000 mg 200 mL/hr over 60 Minutes Intravenous Every 12 hours 07/03/16 0305 07/07/16 1636   07/03/16 1200  piperacillin-tazobactam (ZOSYN) IVPB 3.375 g  Status:  Discontinued     3.375 g 100 mL/hr over 30 Minutes Intravenous Every 6 hours 07/03/16 0951 07/03/16 0958   07/03/16 1200  piperacillin-tazobactam (ZOSYN) IVPB 3.375 g  Status:  Discontinued     3.375 g 12.5 mL/hr over 240 Minutes Intravenous Every 8 hours 07/03/16 1000 07/08/16 0950   07/03/16 1100  ceFEPIme (MAXIPIME) 1 g in dextrose 5 % 50 mL IVPB  Status:  Discontinued     1 g 100 mL/hr over 30 Minutes Intravenous Every 12 hours 07/03/16 0305 07/03/16 0951   07/03/16 0015  vancomycin (VANCOCIN) IVPB 1000 mg/200 mL premix     1,000 mg 200 mL/hr over 60 Minutes Intravenous  Once 07/03/16 0001 07/03/16 0306   07/03/16 0015  ceFEPIme (MAXIPIME) 2 g in dextrose 5 % 50 mL IVPB     2 g 100 mL/hr over 30 Minutes Intravenous  Once 07/03/16 0002 07/03/16 0152      Assessment/Plan: s/p * No surgery found * continue ng and bowel rest   Check abd xray today. If improved could try clamping trial of ng Clinically improved  LOS: 8 days  TOTH III,Latoya Diskin S 07/11/2016

## 2016-07-12 ENCOUNTER — Inpatient Hospital Stay (HOSPITAL_COMMUNITY): Payer: Medicare Other

## 2016-07-12 LAB — DIFFERENTIAL
BASOS ABS: 0.1 10*3/uL (ref 0.0–0.1)
BASOS PCT: 0 %
Eosinophils Absolute: 1.2 10*3/uL — ABNORMAL HIGH (ref 0.0–0.7)
Eosinophils Relative: 6 %
LYMPHS PCT: 22 %
Lymphs Abs: 4.3 10*3/uL — ABNORMAL HIGH (ref 0.7–4.0)
MONO ABS: 2 10*3/uL — AB (ref 0.1–1.0)
Monocytes Relative: 11 %
NEUTROS ABS: 11.6 10*3/uL — AB (ref 1.7–7.7)
Neutrophils Relative %: 61 %

## 2016-07-12 LAB — GLUCOSE, CAPILLARY
GLUCOSE-CAPILLARY: 113 mg/dL — AB (ref 65–99)
GLUCOSE-CAPILLARY: 131 mg/dL — AB (ref 65–99)
Glucose-Capillary: 107 mg/dL — ABNORMAL HIGH (ref 65–99)
Glucose-Capillary: 113 mg/dL — ABNORMAL HIGH (ref 65–99)
Glucose-Capillary: 115 mg/dL — ABNORMAL HIGH (ref 65–99)
Glucose-Capillary: 126 mg/dL — ABNORMAL HIGH (ref 65–99)
Glucose-Capillary: 129 mg/dL — ABNORMAL HIGH (ref 65–99)

## 2016-07-12 LAB — COMPREHENSIVE METABOLIC PANEL
ALT: 10 U/L — AB (ref 17–63)
AST: 27 U/L (ref 15–41)
Albumin: 2.8 g/dL — ABNORMAL LOW (ref 3.5–5.0)
Alkaline Phosphatase: 44 U/L (ref 38–126)
Anion gap: 6 (ref 5–15)
BUN: 20 mg/dL (ref 6–20)
CHLORIDE: 107 mmol/L (ref 101–111)
CO2: 23 mmol/L (ref 22–32)
CREATININE: 0.79 mg/dL (ref 0.61–1.24)
Calcium: 8.6 mg/dL — ABNORMAL LOW (ref 8.9–10.3)
GFR calc Af Amer: >60 mL/min (ref >60)
GFR calc non Af Amer: >60 mL/min (ref >60)
Glucose, Bld: 128 mg/dL — ABNORMAL HIGH (ref 65–99)
POTASSIUM: 4.1 mmol/L (ref 3.5–5.1)
SODIUM: 136 mmol/L (ref 135–145)
Total Bilirubin: 0.5 mg/dL (ref 0.3–1.2)
Total Protein: 5.6 g/dL — ABNORMAL LOW (ref 6.5–8.1)

## 2016-07-12 LAB — CBC
HCT: 35.1 % — ABNORMAL LOW (ref 39.0–52.0)
Hemoglobin: 12.2 g/dL — ABNORMAL LOW (ref 13.0–17.0)
MCH: 29.9 pg (ref 26.0–34.0)
MCHC: 34.8 g/dL (ref 30.0–36.0)
MCV: 86 fL (ref 78.0–100.0)
Platelets: 353 10*3/uL (ref 150–400)
RBC: 4.08 MIL/uL — AB (ref 4.22–5.81)
RDW: 14.3 % (ref 11.5–15.5)
WBC: 19 10*3/uL — AB (ref 4.0–10.5)

## 2016-07-12 LAB — PHOSPHORUS: PHOSPHORUS: 1.9 mg/dL — AB (ref 2.5–4.6)

## 2016-07-12 LAB — TRIGLYCERIDES: TRIGLYCERIDES: 172 mg/dL — AB (ref <150)

## 2016-07-12 LAB — PREALBUMIN: Prealbumin: 19.4 mg/dL (ref 18–38)

## 2016-07-12 LAB — MAGNESIUM: MAGNESIUM: 1.8 mg/dL (ref 1.7–2.4)

## 2016-07-12 MED ORDER — MAGNESIUM SULFATE 2 GM/50ML IV SOLN
2.0000 g | Freq: Once | INTRAVENOUS | Status: AC
  Administered 2016-07-12: 2 g via INTRAVENOUS
  Filled 2016-07-12: qty 50

## 2016-07-12 MED ORDER — SODIUM PHOSPHATES 45 MMOLE/15ML IV SOLN
15.0000 mmol | Freq: Once | INTRAVENOUS | Status: AC
  Administered 2016-07-12: 15 mmol via INTRAVENOUS
  Filled 2016-07-12: qty 5

## 2016-07-12 MED ORDER — FAT EMULSION 20 % IV EMUL
240.0000 mL | INTRAVENOUS | Status: AC
  Administered 2016-07-12: 240 mL via INTRAVENOUS
  Filled 2016-07-12: qty 250

## 2016-07-12 MED ORDER — TRACE MINERALS CR-CU-MN-SE-ZN 10-1000-500-60 MCG/ML IV SOLN
INTRAVENOUS | Status: AC
  Administered 2016-07-12: 18:00:00 via INTRAVENOUS
  Filled 2016-07-12: qty 960

## 2016-07-12 MED ORDER — SODIUM PHOSPHATE-NACL 15 MMOL/250ML IV SOLN
15.0000 mmol | Freq: Once | INTRAVENOUS | Status: DC
  Filled 2016-07-12: qty 250

## 2016-07-12 NOTE — Progress Notes (Signed)
PROGRESS NOTE  Dustin Dominguez  [id number] DOB: 11/14/1940 DOA: 07/02/2016 PCP: Redge Gainer, MD  Brief Narrative:   The patient is a 75 year old male with history of diabetes mellitus type 2, chronic pain, coronary artery disease, hypertension who presented to the emergency department with worsening pain and swelling of the scrotum was present for 3 weeks prior to admission. The area was draining purulent fluid and became acutely worse 4-5 days prior to admission. He underwent I&D of scrotal abscess by urology on 12/16 the cultures of which grew Klebsiella. Postoperatively, he developed severe constipation. Gastroenterology was consulted because the patient had little results with cathartics. Imaging demonstrated an SBO with a transition zone in general surgery was consulted. He had an NG tube placed to low intermittent suction and has been receiving laxatives for severe constipation.  Improved bowel sounds, had spontaneous bowel movement yesterday, feeling better. X-ray shows improvement in bowel obstruction.  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 - Scrotal culture negative thus far. Patient afebrile - Continue dressing changes as recommended per urology.  - Urology has since signed off as of 07/04/2016 with recommendations for follow-up in 10-14 days for wound check. - Continue cefazolin per sensitivities  SBO due to adhesions and constipation. The  patient has had splenectomy, right adrenal mass excision and open cholecystectomy previously. - Appreciate gastroenterology and general surgery assistance -  KUB:  Improvement in partial bowel obstruction -  PICC with TPN, continue until tolerating full liquids -  Attempting to remove NG today  AKI due to inadequate IV fluids and ACE inhibitor.  Creatinine up to 1.8, baseline 0.7, resolved with IVF  diet-controlled diabetes mellitus type 2 - Hemoglobin A1c was 5.5 on 09/10/2015.  - continue low dose SSI  Hypertension, blood pressures mildly elevated -Continue to hold oral medications - Continue IV metoprolol -  Add when necessary hydralazine  coronary artery disease s/p CABG with last cath in April 2017 which showed severe 2 vessel disease but patent bypasses -remains asymptomatic - aspirin on hold due to SBO  Chronic pain and anxiety -  Patient is on MS Contin 30 mg by mouth 3 times a day, Percocet 10-3 25 6  times daily, and Valium 10 mg by mouth twice a day at home at baseline -  Continue morphine 2mg  IV q4h prn -  Continue ativan 0.5mg  prn -  Continue nightly ativan to help with sleep  Severe protein calorie malnutrition -  PICC line with TNA -  Plan to start supplements once diet advanced  Hypokalemia, continue potassium in IVF.    Hypomagnesemia and hypophosphatemia:  IV magnesium sulfate and sodium phosphate   DVT prophylaxis:  Heparin Code Status:  Full code Family Communication:  Patient alone Disposition Plan:  Pending resolution of bowel obstruction   Consultants:   Urology  [facility], Dr. Ardis Hughs  General Surgery  Procedures:   Incision and drainage of scrotal wall abscess and Dr. Dahlstedt12/16/2017  Antimicrobials:  Anti-infectives  Start     Dose/Rate Route Frequency Ordered Stop   07/08/16 1200  ceFAZolin (ANCEF) IVPB 2g/100 mL premix     2 g 200 mL/hr over 30 Minutes Intravenous Every 8 hours 07/08/16 0950     07/03/16  1200  vancomycin (VANCOCIN) IVPB 1000 mg/200 mL premix  Status:  Discontinued     1,000 mg 200 mL/hr over 60 Minutes Intravenous Every 12 hours 07/03/16 0305 07/07/16 1636   07/03/16 1200  piperacillin-tazobactam (ZOSYN) IVPB 3.375 g  Status:  Discontinued     3.375 g 100 mL/hr over 30 Minutes Intravenous Every 6 hours 07/03/16 0951 07/03/16 0958   07/03/16 1200  piperacillin-tazobactam (ZOSYN) IVPB 3.375 g  Status:  Discontinued     3.375 g 12.5 mL/hr over 240 Minutes Intravenous Every 8 hours 07/03/16 1000 07/08/16 0950   07/03/16 1100  ceFEPIme (MAXIPIME) 1 g in dextrose 5 % 50 mL IVPB  Status:  Discontinued     1 g 100 mL/hr over 30 Minutes Intravenous Every 12 hours 07/03/16 0305 07/03/16 0951   07/03/16 0015  vancomycin (VANCOCIN) IVPB 1000 mg/200 mL premix     1,000 mg 200 mL/hr over 60 Minutes Intravenous  Once 07/03/16 0001 07/03/16 0306   07/03/16 0015  ceFEPIme (MAXIPIME) 2 g in dextrose 5 % 50 mL IVPB     2 g 100 mL/hr over 30 Minutes Intravenous  Once 07/03/16 0002 07/03/16 0152       Subjective: Spontaneous BM yesterday and passing gas this morning.  Abdomen feels better today.  NG output looks clearer.  Wants NG out today   Objective: Vitals:   07/11/16 2004 07/12/16 0424 07/12/16 0435 07/12/16 1345  BP: (!) 186/72 (!) 182/67 (!) 158/64 (!) 156/68  Pulse: 67 66  64  Resp: 18 18  17   Temp: 98 F (36.7 C) 97.5 F (36.4 C)  98.6 F (37 C)  TempSrc: Oral Oral  Oral  SpO2: 100% 97%    Weight:      Height:        Intake/Output Summary (Last 24 hours) at 07/12/16 1421 Last data filed at 07/12/16 1345  Gross per 24 hour  Intake          2950.17 ml  Output             3350 ml  Net          -399.83 ml   Filed Weights   07/02/16 2023 07/03/16 0540 07/09/16 1509  Weight: 85.3 kg (188 lb) 87.4 kg (192 lb 10.9 oz) 78.5 kg (173 lb)    Examination:  General exam:  Adult Male.  No acute distress.   HEENT:  NCAT, MMM Respiratory system: Clear to auscultation  bilaterally Cardiovascular system: Regular rate and rhythm, normal S1/S2. No murmurs, rubs, gallops or clicks.  Warm extremities Gastrointestinal system: normal active bowel sounds, soft, still distended over the right upper and lower quadrants, nontender. MSK:  Normal tone and bulk, no lower extremity edema Neuro:  Grossly intact    Data Reviewed: I have personally reviewed following labs and imaging studies  CBC:  Recent Labs Lab 07/08/16 0539 07/09/16 0524 07/10/16 0748 07/11/16 0610 07/12/16 0500  WBC 20.8* 21.7* 20.1* 17.3* 19.0*  NEUTROABS  --   --   --  10.5* 11.6*  HGB 14.6 16.3 14.2 12.8* 12.2*  HCT 42.5 45.6 40.8 36.5* 35.1*  MCV 85.7 83.8 85.4 85.1 86.0  PLT 428* 421* 439* 388 0000000   Basic Metabolic Panel:  Recent Labs Lab 07/06/16 1030 07/08/16 0539 07/09/16 0524 07/10/16 0748 07/11/16 0610 07/12/16 0500  NA 137 135 136 137 136 136  K 4.1 3.5 3.9 3.2* 3.4* 4.1  CL 102 96* 93* 101 103 107  CO2 28 29 32 25 25 23   GLUCOSE 141* 144* 160* 127* 130* 128*  BUN 19 28* 51* 46* 34* 20  CREATININE 0.84 1.20 1.82* 1.10 0.80 0.79  CALCIUM 9.1 9.3 9.9 8.9 8.4* 8.6*  MG 1.9  --   --   --  1.5* 1.8  PHOS  --   --   --   --  1.4* 1.9*   GFR: Estimated Creatinine Clearance: 87.6 mL/min (by C-G formula based on SCr of 0.79 mg/dL). Liver Function Tests:  Recent Labs Lab 07/11/16 0610 07/12/16 0500  AST 26 27  ALT 10* 10*  ALKPHOS 41 44  BILITOT 0.5 0.5  PROT 5.6* 5.6*  ALBUMIN 2.8* 2.8*   No results for input(s): LIPASE, AMYLASE in the last 168 hours. No results for input(s): AMMONIA in the last 168 hours. Coagulation Profile: No results for input(s): INR, PROTIME in the last 168 hours. Cardiac Enzymes: No results for input(s): CKTOTAL, CKMB, CKMBINDEX, TROPONINI in the last 168 hours. BNP (last 3 results) No results for input(s): PROBNP in the last 8760 hours. HbA1C: No results for input(s): HGBA1C in the last 72 hours. CBG:  Recent Labs Lab  07/11/16 2002 07/12/16 0057 07/12/16 0419 07/12/16 0730 07/12/16 1214  GLUCAP 128* 129* 131* 126* 113*   Lipid Profile:  Recent Labs  07/11/16 0609 07/12/16 0500  TRIG 163* 172*   Thyroid Function Tests: No results for input(s): TSH, T4TOTAL, FREET4, T3FREE, THYROIDAB in the last 72 hours. Anemia Panel: No results for input(s): VITAMINB12, FOLATE, FERRITIN, TIBC, IRON, RETICCTPCT in the last 72 hours. Urine analysis:    Component Value Date/Time   COLORURINE YELLOW 07/02/2016 2134   APPEARANCEUR CLEAR 07/02/2016 2134   APPEARANCEUR Cloudy (A) 06/16/2016 1131   LABSPEC 1.021 07/02/2016 2134   PHURINE 5.0 07/02/2016 2134   GLUCOSEU NEGATIVE 07/02/2016 2134   HGBUR SMALL (A) 07/02/2016 2134   BILIRUBINUR NEGATIVE 07/02/2016 2134   BILIRUBINUR Negative 06/16/2016 1131   KETONESUR NEGATIVE 07/02/2016 2134   PROTEINUR NEGATIVE 07/02/2016 2134   NITRITE NEGATIVE 07/02/2016 2134   LEUKOCYTESUR TRACE (A) 07/02/2016 2134   LEUKOCYTESUR 3+ (A) 06/16/2016 1131   Sepsis Labs: @LABRCNTIP (procalcitonin:4,lacticidven:4)  ) Recent Results (from the past 240 hour(s))  Urine culture     Status: Abnormal   Collection Time: 07/02/16  9:33 PM  Result Value Ref Range Status   Specimen Description URINE, CLEAN CATCH  Final   Special Requests NONE  Final   Culture (A)  Final    60,000 COLONIES/mL STAPHYLOCOCCUS SPECIES (COAGULASE NEGATIVE)   Report Status 07/05/2016 FINAL  Final   Organism ID, Bacteria STAPHYLOCOCCUS SPECIES (COAGULASE NEGATIVE) (A)  Final      Susceptibility   Staphylococcus species (coagulase negative) - MIC*    CIPROFLOXACIN >=8 RESISTANT Resistant     GENTAMICIN <=0.5 SENSITIVE Sensitive     NITROFURANTOIN <=16 SENSITIVE Sensitive     OXACILLIN >=4 RESISTANT Resistant     TETRACYCLINE 2 SENSITIVE Sensitive     VANCOMYCIN 1 SENSITIVE Sensitive     TRIMETH/SULFA 80 RESISTANT Resistant     CLINDAMYCIN <=0.25 SENSITIVE Sensitive     RIFAMPIN <=0.5 SENSITIVE  Sensitive     Inducible Clindamycin NEGATIVE Sensitive     * 60,000 COLONIES/mL STAPHYLOCOCCUS SPECIES (COAGULASE  NEGATIVE)  Blood culture (routine x 2)     Status: None   Collection Time: 07/03/16 12:25 AM  Result Value Ref Range Status   Specimen Description BLOOD LEFT ARM  Final   Special Requests BOTTLES DRAWN AEROBIC AND ANAEROBIC 5CC  Final   Culture NO GROWTH 5 DAYS  Final   Report Status 07/08/2016 FINAL  Final  Blood culture (routine x 2)     Status: None   Collection Time: 07/03/16 12:31 AM  Result Value Ref Range Status   Specimen Description BLOOD RIGHT HAND  Final   Special Requests BOTTLES DRAWN AEROBIC AND ANAEROBIC 5CC  Final   Culture NO GROWTH 5 DAYS  Final   Report Status 07/08/2016 FINAL  Final  Aerobic Culture (superficial specimen)     Status: None   Collection Time: 07/03/16 10:58 AM  Result Value Ref Range Status   Specimen Description ABSCESS SCROTUM  Final   Special Requests NORMAL  Final   Gram Stain   Final    RARE WBC PRESENT, PREDOMINANTLY PMN NO ORGANISMS SEEN Performed at [place]  Final   Report Status 07/08/2016 FINAL  Final   Organism ID, Bacteria KLEBSIELLA PNEUMONIAE  Final      Susceptibility   Klebsiella pneumoniae - MIC*    AMPICILLIN >=32 RESISTANT Resistant     CEFAZOLIN <=4 SENSITIVE Sensitive     CEFEPIME <=1 SENSITIVE Sensitive     CEFTAZIDIME <=1 SENSITIVE Sensitive     CEFTRIAXONE <=1 SENSITIVE Sensitive     CIPROFLOXACIN >=4 RESISTANT Resistant     GENTAMICIN <=1 SENSITIVE Sensitive     IMIPENEM 0.5 SENSITIVE Sensitive     TRIMETH/SULFA <=20 SENSITIVE Sensitive     AMPICILLIN/SULBACTAM 16 INTERMEDIATE Intermediate     PIP/TAZO 16 SENSITIVE Sensitive     Extended ESBL NEGATIVE Sensitive     * RARE KLEBSIELLA PNEUMONIAE      Radiology Studies: Dg Abd 2 Views  Result Date: 07/11/2016 CLINICAL DATA:  Small bowel obstruction EXAM: ABDOMEN - 2 VIEW COMPARISON:  07/10/2016,  07/09/2016 FINDINGS: Several loops of dilated small bowel again noted without significant interval change. There is air and contrast within the colon. Air in the rectum noted. Upright exam demonstrates scattered air-fluid levels in the small bowel and colon. Appearance compatible with residual persistent partial small bowel obstruction. No free air evident. IMPRESSION: Persistent small bowel dilatation and air-fluid levels but air and contrast within the colon compatible with persistent partial obstruction pattern. Electronically Signed   By: Jerilynn Mages.  Shick M.D.   On: 07/11/2016 10:34   Dg Abd Portable 1v  Result Date: 07/12/2016 CLINICAL DATA:  Small bowel obstruction, followup EXAM: PORTABLE ABDOMEN - 1 VIEW COMPARISON:  07/11/2016 FINDINGS: Nasogastric tube projects over stomach. Retained contrast in colon. Air-filled loops of small bowel in the RIGHT mid abdomen slightly decreased in caliber since previous study. No bowel wall thickening or free intraperitoneal air. Bones demineralized. Surgical clips RIGHT upper quadrant question cholecystectomy. IMPRESSION: Decreased small bowel distention since previous study. Electronically Signed   By: Lavonia Dana M.D.   On: 07/12/2016 09:21     Scheduled Meds: .  ceFAZolin (ANCEF) IV  2 g Intravenous Q8H  . heparin subcutaneous  5,000 Units Subcutaneous Q8H  . Influenza vac split quadrivalent PF  0.5 mL Intramuscular Tomorrow-1000  . insulin aspart  0-9 Units Subcutaneous Q4H  . LORazepam  1 mg Intravenous QHS  . magnesium sulfate 1 - 4  g bolus IVPB  2 g Intravenous Once  . metoprolol  5 mg Intravenous Q6H  . pantoprazole (PROTONIX) IV  40 mg Intravenous Q12H  . sodium phosphate  Dextrose 5% IVPB  15 mmol Intravenous Once   Continuous Infusions: . dextrose 5 % and 0.45 % NaCl with KCl 40 mEq/L 85 mL/hr at 07/12/16 1323  . TPN (CLINIMIX) Adult without lytes     And  . fat emulsion    . TPN (CLINIMIX) Adult without lytes 40 mL/hr at 07/12/16 0441   And   . fat emulsion 250 mL (07/12/16 0441)     LOS: 9 days    Time spent: 30 min    Janece Canterbury, MD [facility] Pager [phone]  If 7PM-7AM, please contact night-coverage [email or web address] Password [email or web address] 07/12/2016, 2:21 PM

## 2016-07-12 NOTE — Progress Notes (Addendum)
[place] [facility] Office:  [phone] General Surgery Progress Note   LOS: 9 days  POD -     Assessment/Plan: 1.  SBO/loss of muscle tone of right abdomen  KUB today shows decreased SB distention - he is having BM's  [Note:  I did his open chole in 2004 - He was done open because of extensive intra-abdominal adhesions]  Will try NGT out.  Would keep NPO today, then consider starting clear liquids tomorrow.  Needs to continue to ambulate.  Unclear from my exam as to why his WBC is incresed.  2.  Scrotal abscess   Drained 07/03/2016 - Dr. Diona Fanti  WBC - 19,000 - 07/12/2016  On Ancef  This looks good. 3.  DM 4.  Chronic pain 5.  CAD 6.  HTN 7.  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  Does not like NGT.  Has had BM.  Objective:   Vitals:   07/12/16 0424 07/12/16 0435  BP: (!) 182/67 (!) 158/64  Pulse: 66   Resp: 18   Temp: 97.5 F (36.4 C)      Intake/Output from previous day:  12/24 0701 - 12/25 0700 In: 2950.2 [I.V.:2950.2] Out: 3100 [Urine:2400; Emesis/NG output:700]  Intake/Output this shift:  Total I/O In: 0  Out: 450 [Urine:450]   Physical Exam:   General: [name] who is alert and oriented.    HEENT: Normal. Pupils equal. .   Lungs: Clear.   Abdomen: Soft.  Has multiple abdominal scars form prior surgery.  His right abdominal wall is lax (from multiple prior operations)   Wound: Scrotal wound is clean   Lab Results:    Recent Labs  07/11/16 0610  07/12/16 0500  WBC 17.3* 19.0*  HGB 12.8* 12.2*  HCT 36.5* 35.1*  PLT 388 353    BMET   Recent Labs  07/11/16 0610 07/12/16 0500  NA 136 136  K 3.4* 4.1  CL 103 107  CO2 25 23  GLUCOSE 130* 128*  BUN 34* 20  CREATININE 0.80 0.79  CALCIUM 8.4* 8.6*    PT/INR  No results for input(s): LABPROT, INR in the last 72 hours.  ABG  No results for input(s): PHART, HCO3 in the last 72 hours.  Invalid input(s): PCO2, PO2   Studies/Results:  Dg Abd 2 Views  Result Date: 07/11/2016 CLINICAL DATA:  Small bowel obstruction EXAM: ABDOMEN - 2 VIEW COMPARISON:  07/10/2016, 07/09/2016 FINDINGS: Several loops of dilated small bowel again noted without significant interval change. There is air and contrast within the colon. Air in the rectum noted. Upright exam demonstrates scattered air-fluid levels in the small bowel and colon. Appearance compatible with residual persistent partial small bowel obstruction. No free air evident. IMPRESSION: Persistent small bowel dilatation and air-fluid levels but air and contrast within the colon compatible with persistent partial obstruction pattern. Electronically Signed   By: Jerilynn Mages.  Shick M.D.   On: 07/11/2016 10:34   Dg Abd Portable 1v  Result Date: 07/12/2016 CLINICAL DATA:  Small  bowel obstruction, followup EXAM: PORTABLE ABDOMEN - 1 VIEW COMPARISON:  07/11/2016 FINDINGS: Nasogastric tube projects over stomach. Retained contrast in colon. Air-filled loops of small bowel in the RIGHT mid abdomen slightly decreased in caliber since previous study. No bowel wall thickening or free intraperitoneal air. Bones demineralized. Surgical clips RIGHT upper quadrant question cholecystectomy. IMPRESSION: Decreased small bowel distention since previous study. Electronically Signed   By: Lavonia Dana M.D.   On: 07/12/2016 09:21     Anti-infectives:   Anti-infectives    Start     Dose/Rate Route Frequency Ordered Stop   07/08/16 1200  ceFAZolin (ANCEF) IVPB 2g/100 mL  premix     2 g 200 mL/hr over 30 Minutes Intravenous Every 8 hours 07/08/16 0950     07/03/16 1200  vancomycin (VANCOCIN) IVPB 1000 mg/200 mL premix  Status:  Discontinued     1,000 mg 200 mL/hr over 60 Minutes Intravenous Every 12 hours 07/03/16 0305 07/07/16 1636   07/03/16 1200  piperacillin-tazobactam (ZOSYN) IVPB 3.375 g  Status:  Discontinued     3.375 g 100 mL/hr over 30 Minutes Intravenous Every 6 hours 07/03/16 0951 07/03/16 0958   07/03/16 1200  piperacillin-tazobactam (ZOSYN) IVPB 3.375 g  Status:  Discontinued     3.375 g 12.5 mL/hr over 240 Minutes Intravenous Every 8 hours 07/03/16 1000 07/08/16 0950   07/03/16 1100  ceFEPIme (MAXIPIME) 1 g in dextrose 5 % 50 mL IVPB  Status:  Discontinued     1 g 100 mL/hr over 30 Minutes Intravenous Every 12 hours 07/03/16 0305 07/03/16 0951   07/03/16 0015  vancomycin (VANCOCIN) IVPB 1000 mg/200 mL premix     1,000 mg 200 mL/hr over 60 Minutes Intravenous  Once 07/03/16 0001 07/03/16 0306   07/03/16 0015  ceFEPIme (MAXIPIME) 2 g in dextrose 5 % 50 mL IVPB     2 g 100 mL/hr over 30 Minutes Intravenous  Once 07/03/16 0002 07/03/16 0152      Alphonsa Overall, MD, FACS Pager: [place] [facility] Office: [phone] 07/12/2016

## 2016-07-12 NOTE — Progress Notes (Signed)
[facility] NOTE   Pharmacy Consult for TPN Indication: prolonged ileus  Patient Measurements: Height: 5\' 11"  (180.3 cm) Weight: 173 lb (78.5 kg) (bed scale ) IBW/kg (Calculated) : 75.3 TPN AdjBW (KG): 87.4 Body mass index is 24.13 kg/m. Usual Weight: 87kg   Insulin Requirements: 4 units Novolog SSI since TPN was started yesterday at 6PM;  No insulin in TPN bag  Current Nutrition: NPO  IVF: D5 1/2 NS w/ 78meq KCl/L @ 80 mL/hr  Central access: PICC placed 12/23 TPN start date: 12/24   Recent Labs  07/11/16 0609 07/11/16 0610 07/12/16 0500  NA  --  136 136  K  --  3.4* 4.1  CL  --  103 107  CO2  --  25 23  GLUCOSE  --  130* 128*  BUN  --  34* 20  CREATININE  --  0.80 0.79  CALCIUM  --  8.4* 8.6*  PHOS  --  1.4* 1.9*  MG  --  1.5* 1.8  ALBUMIN  --  2.8* 2.8*  ALKPHOS  --  41 44  AST  --  26 27  ALT  --  10* 10*  BILITOT  --  0.5 0.5  TRIG 163*  --  172*  PREALBUMIN 22.0  --  19.4  Corrected Ca: 9.6 on 12/25  ASSESSMENT                                                                                                          HPI:  75 year old M with history of diabetes mellitus type 2, chronic pain, coronary artery disease, hypertension who presented to the ED with worsening pain and swelling of the scrotum present for 3 weeks prior to admission.  He underwent I&D.  Postoperatively he developed severe constipation and ultimately SBO.  Significant events:  12/24 TPN started; RD notes patient at high risk for refeeding syndrome.    Today:   CBGs (goal 100 - 150): 126-128 on SSI  Electrolytes - Phos improved but still low:  K, Mg WNL after IV supplementation yesterday but Clinimix 5/15 contains no electrolytes; concomitant K-containing IVF noted  Renal - SCr WNL, stable  LFTs - all below ULN  TGs - 163 (12/24), 172 (12/25) - acceptable  Prealbumin - 22.0 (12/24), 19.4 (12/25) - WNL  NUTRITIONAL GOALS                                                                                              RD recs (12/22): 2000-2400 Kcal/day,  110-125g protein/day Clinimix 5/15 at a goal rate of 54ml/hr + 20% fat emulsion at 86ml/hr would provide: 100 g/day protein, 1900 Kcal/day.  PLAN                                                Sod Phosphate 15 mM IV x 1 today  Mag Sulfate 2 grams IV x 1 today At 1800 today:  Given prealbumin WNL, low phosphorus, and RD's note that patient is at high risk for refeeding syndrome, will continue Clinimix 5/15 at 40 mL/hr and 20% Fat Emulsion at 10 mL/hr for today  Clinimix 5/15 contains no electrolytes, but patient receiving K approximately 80 mEq/day in IVF, and Phos, Mag are being supplemented via separate infusions today as outlined above  TPN to contain standard multivitamins and trace elements.  Continue sensitive SSI Novolog q4h  BMet, Mag, Phos tomorrow  Tomorrow will consider advancement in TPN rate  Routine TPN lab panels on Mondays & Thursdays.  Clayburn Pert, PharmD, BCPS Pager: [phone] 07/12/2016  9:50 AM

## 2016-07-13 LAB — CBC
HCT: 35.8 % — ABNORMAL LOW (ref 39.0–52.0)
Hemoglobin: 12.2 g/dL — ABNORMAL LOW (ref 13.0–17.0)
MCH: 29.3 pg (ref 26.0–34.0)
MCHC: 34.1 g/dL (ref 30.0–36.0)
MCV: 86.1 fL (ref 78.0–100.0)
Platelets: 350 10*3/uL (ref 150–400)
RBC: 4.16 MIL/uL — ABNORMAL LOW (ref 4.22–5.81)
RDW: 14.4 % (ref 11.5–15.5)
WBC: 18.5 10*3/uL — ABNORMAL HIGH (ref 4.0–10.5)

## 2016-07-13 LAB — GLUCOSE, CAPILLARY
GLUCOSE-CAPILLARY: 128 mg/dL — AB (ref 65–99)
GLUCOSE-CAPILLARY: 117 mg/dL — AB (ref 65–99)
GLUCOSE-CAPILLARY: 106 mg/dL — AB (ref 65–99)
Glucose-Capillary: 113 mg/dL — ABNORMAL HIGH (ref 65–99)
Glucose-Capillary: 125 mg/dL — ABNORMAL HIGH (ref 65–99)
Glucose-Capillary: 124 mg/dL — ABNORMAL HIGH (ref 65–99)

## 2016-07-13 LAB — BASIC METABOLIC PANEL
Anion gap: 5 (ref 5–15)
BUN: 16 mg/dL (ref 6–20)
CO2: 22 mmol/L (ref 22–32)
Calcium: 8.6 mg/dL — ABNORMAL LOW (ref 8.9–10.3)
Chloride: 106 mmol/L (ref 101–111)
Creatinine, Ser: 0.68 mg/dL (ref 0.61–1.24)
GFR calc Af Amer: >60 mL/min (ref >60)
GLUCOSE: 132 mg/dL — AB (ref 65–99)
Potassium: 4 mmol/L (ref 3.5–5.1)
Sodium: 133 mmol/L — ABNORMAL LOW (ref 135–145)

## 2016-07-13 LAB — C DIFFICILE QUICK SCREEN W PCR REFLEX
C DIFFICILE (CDIFF) TOXIN: NEGATIVE
C Diff antigen: NEGATIVE
C Diff interpretation: NOT DETECTED

## 2016-07-13 LAB — PHOSPHORUS: PHOSPHORUS: 2.4 mg/dL — AB (ref 2.5–4.6)

## 2016-07-13 LAB — MAGNESIUM: MAGNESIUM: 1.8 mg/dL (ref 1.7–2.4)

## 2016-07-13 MED ORDER — TRACE MINERALS CR-CU-MN-SE-ZN 10-1000-500-60 MCG/ML IV SOLN
INTRAVENOUS | Status: AC
  Administered 2016-07-13: 18:00:00 via INTRAVENOUS
  Filled 2016-07-13: qty 960

## 2016-07-13 MED ORDER — FAT EMULSION 20 % IV EMUL
240.0000 mL | INTRAVENOUS | Status: AC
  Administered 2016-07-13: 240 mL via INTRAVENOUS
  Filled 2016-07-13: qty 250

## 2016-07-13 MED ORDER — BOOST / RESOURCE BREEZE PO LIQD
1.0000 | Freq: Three times a day (TID) | ORAL | Status: DC
  Administered 2016-07-13 (×2): 1 via ORAL

## 2016-07-13 MED ORDER — MAGNESIUM SULFATE 2 GM/50ML IV SOLN
2.0000 g | Freq: Once | INTRAVENOUS | Status: AC
  Administered 2016-07-13: 2 g via INTRAVENOUS
  Filled 2016-07-13: qty 50

## 2016-07-13 MED ORDER — GERHARDT'S BUTT CREAM
TOPICAL_CREAM | Freq: Three times a day (TID) | CUTANEOUS | Status: DC
  Administered 2016-07-13 – 2016-07-14 (×4): via TOPICAL
  Administered 2016-07-15: 1 via TOPICAL
  Filled 2016-07-13: qty 1

## 2016-07-13 MED ORDER — SODIUM PHOSPHATES 45 MMOLE/15ML IV SOLN
15.0000 mmol | Freq: Once | INTRAVENOUS | Status: AC
  Administered 2016-07-13: 15 mmol via INTRAVENOUS
  Filled 2016-07-13: qty 5

## 2016-07-13 NOTE — Progress Notes (Signed)
PHARMACY - ADULT TOTAL PARENTERAL NUTRITION CONSULT NOTE   Pharmacy Consult for TPN Indication: SBO  Patient Measurements: Height: 5\' 11"  (180.3 cm) Weight: 173 lb (78.5 kg) (bed scale ) IBW/kg (Calculated) : 75.3 TPN AdjBW (KG): 87.4 Body mass index is 24.13 kg/m. Usual Weight: 87kg   Insulin Requirements: ; 3 units Novolog SSI in past 24 hours. No insulin in TPN bag  Current Nutrition: NPO  IVF: D5 1/2 NS w/ 69meq KCl/L @ 85 mL/hr  Central access: PICC placed 12/23 TPN start date: 12/24     Recent Labs  07/11/16 0609  07/11/16 0610 07/12/16 0500 07/13/16 0457  NA  --   --  136 136 133*  K  --   --  3.4* 4.1 4.0  CL  --   --  103 107 106  CO2  --   --  25 23 22   GLUCOSE  --   --  130* 128* 132*  BUN  --   --  34* 20 16  CREATININE  --   --  0.80 0.79 0.68  CALCIUM  --   --  8.4* 8.6* 8.6*  PHOS  --   < > 1.4* 1.9* 2.4*  MG  --   < > 1.5* 1.8 1.8  ALBUMIN  --   --  2.8* 2.8*  --   ALKPHOS  --   --  41 44  --   AST  --   --  26 27  --   ALT  --   --  10* 10*  --   BILITOT  --   --  0.5 0.5  --   TRIG 163*  --   --  172*  --   PREALBUMIN 22.0  --   --  19.4  --   < > = values in this interval not displayed.Corrected Ca: 9.6 on 1226  ASSESSMENT                                                                                                          HPI:  75 year old M with history of diabetes mellitus type 2, chronic pain, coronary artery disease, hypertension who presented to the ED with worsening pain and swelling of the scrotum present for 3 weeks prior to admission.  He underwent I&D.  Postoperatively he developed severe constipation and ultimately SBO.  Significant events:  12/24: TPN started; RD notes patient at high risk for refeeding syndrome.   12/25: Having BMs, KUB shows decreased SB distention; NGT removed.   Today:   CBGs (goal 100 - 150): 107-128 on SSI ; Hx DM 2, controlled with diet and Amaryl PTA   Electrolytes - Phos improved after  supplementation yesterday, now just below LLN; Na slightly low;  Mg WNL after IV supplementation yesterday; K WNL with KCl in IVF.  Note that Clinimix 5/15 contains no electrolytes; the electrolyte-containing version is currently on national backorder.  Renal - SCr WNL, stable  LFTs - all below ULN (12/25)  TGs - 163 (12/24), 172 (12/25) - acceptable  Prealbumin - 22.0 (12/24), 19.4 (12/25) - WNL  NUTRITIONAL GOALS                                                                                             RD recs (12/22): 2000-2400 Kcal/day,  110-125g protein/day Clinimix 5/15 at a goal rate of 25ml/hr + 20% fat emulsion at 68ml/hr would provide: 100 g/day protein, 1900 Kcal/day.   PLAN                                                Sod Phosphate 15 mM IV x 1 today to treat low Phos  Mag Sulfate 2 grams IV x 1 today because on non-electrolyte Clinimix formula  Remains on IVF with KCl 69mEq/L as per MD order - maintaining K WNL. At 1800 today:  Continue Clinimix 5/15 at 40 mL/hr and 20% Fat Emulsion at 10 mL/hr   TPN to contain standard multivitamins and trace elements.  Continue sensitive SSI Novolog q4h  Hoping PO liquids may be introduced today - await MD assessment and orders.  BMet, Mg, Phos tomorrow  Routine TPN lab panels on Mondays & Thursdays.  Clayburn Pert, PharmD, BCPS Pager: [phone] 07/13/2016  7:25 AM
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
## 2016-07-13 NOTE — Progress Notes (Addendum)
PROGRESS NOTE  Dustin Dominguez  [id number] DOB: 05/10/1941 DOA: 07/02/2016 PCP: Redge Gainer, MD  Brief Narrative:   The patient is a 75 year old male with history of diabetes mellitus type 2, chronic pain, coronary artery disease, hypertension who presented to the emergency department with worsening pain and swelling of the scrotum was present for 3 weeks prior to admission. The area was draining purulent fluid and became acutely worse 4-5 days prior to admission. He underwent I&D of scrotal abscess by urology on 12/16 the cultures of which grew Klebsiella. Postoperatively, he developed severe constipation. Gastroenterology was consulted because the patient had little results with cathartics. Imaging demonstrated an SBO with a transition zone in general surgery was consulted. He had an NG tube placed to low intermittent suction and has been receiving laxatives for severe constipation.  Improved bowel sounds, had spontaneous bowel movement yesterday, feeling better. X-ray shows improvement in bowel obstruction.  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 - Scrotal culture negative thus far. Patient afebrile - Continue dressing changes as recommended per urology.  - Urology has since signed off as of 07/04/2016 with recommendations for follow-up in 10-14 days for wound check. - Continue cefazolin per sensitivities through 12/29, then stop  SBO due to  adhesions and constipation. The patient has had splenectomy, right adrenal mass excision and open cholecystectomy previously. -  Appreciate gastroenterology and general surgery assistance -  PICC with TPN, continue TPN tonight but if diet advanced to fulls, okay to stop -  CLD today  Diarrhea/watery stools, likely due to resolving SBO, but smells a little like C. Diff and patient is on antibiotics -  C. Diff PCR  AKI due to inadequate IV fluids and ACE inhibitor.  Creatinine up to 1.8, baseline 0.7, resolved with IVF  Diet-controlled diabetes mellitus type 2 - Hemoglobin A1c was 5.5 on 09/10/2015.  - continue low dose SSI  Hypertension, blood pressures mildly elevated -  Continue to hold oral medications -  Continue IV metoprolol and when necessary hydralazine  coronary artery disease s/p CABG with last cath in April 2017 which showed severe 2 vessel disease but patent bypasses -remains asymptomatic - aspirin on hold due to SBO  Chronic pain and anxiety -  Patient is on MS Contin 30 mg by mouth 3 times a day, Percocet 10-3 25 6  times daily, and Valium 10 mg by mouth twice a day at home at baseline -  Continue morphine 2mg  IV q4h prn -  Continue ativan 0.5mg  prn -  Continue nightly ativan to help with sleep  Severe protein calorie malnutrition -  PICC line with TNA -  Start supplements once diet advanced  Leukocytosis, persistent and may be due to healing abscess -  UA ordered by surgery.  May have resistant UTI but on ancef. -  Check C. Diff PCR  Hypokalemia, continue potassium in IVF.    Hypomagnesemia and hypophosphatemia:  IV magnesium sulfate and sodium phosphate per pharmacy today  DVT prophylaxis:  Heparin Code Status:  Full code Family Communication:  Patient alone Disposition Plan:  Pending resolution of bowel obstruction   Consultants:   Urology  [facility], Dr. Ardis Hughs  General Surgery  Procedures:   Incision and drainage of scrotal  wall abscess and Dr. Dahlstedt12/16/2017  Antimicrobials:  Anti-infectives    Start     Dose/Rate Route Frequency Ordered Stop   07/08/16 1200  ceFAZolin (ANCEF) IVPB 2g/100 mL premix     2 g 200 mL/hr over 30 Minutes Intravenous Every 8 hours 07/08/16 0950     07/03/16 1200  vancomycin (VANCOCIN) IVPB 1000 mg/200 mL premix  Status:  Discontinued     1,000 mg 200 mL/hr over 60 Minutes Intravenous Every 12 hours 07/03/16 0305 07/07/16 1636   07/03/16 1200  piperacillin-tazobactam (ZOSYN) IVPB 3.375 g  Status:  Discontinued     3.375 g 100 mL/hr over 30 Minutes Intravenous Every 6 hours 07/03/16 0951 07/03/16 0958   07/03/16 1200  piperacillin-tazobactam (ZOSYN) IVPB 3.375 g  Status:  Discontinued     3.375 g 12.5 mL/hr over 240 Minutes Intravenous Every 8 hours 07/03/16 1000 07/08/16 0950   07/03/16 1100  ceFEPIme (MAXIPIME) 1 g in dextrose 5 % 50 mL IVPB  Status:  Discontinued     1 g 100 mL/hr over 30 Minutes Intravenous Every 12 hours 07/03/16 0305 07/03/16 0951   07/03/16 0015  vancomycin (VANCOCIN) IVPB 1000 mg/200 mL premix     1,000 mg 200 mL/hr over 60 Minutes Intravenous  Once 07/03/16 0001 07/03/16 0306   07/03/16 0015  ceFEPIme (MAXIPIME) 2 g in dextrose 5 % 50 mL IVPB     2 g 100 mL/hr over 30 Minutes Intravenous  Once 07/03/16 0002 07/03/16 0152       Subjective: Spontaneous BM yesterday and passing gas.  Abdomen feels even better today despite NG removal yesterday.  Ready to try clears.   Objective: Vitals:   07/12/16 2013 07/13/16 0604 07/13/16 1108 07/13/16 1549  BP: (!) 160/66 (!) 172/82  (!) 164/79  Pulse: 64 72  83  Resp: 18 18  18   Temp: 98.9 F (37.2 C) 98.6 F (37 C)  97.9 F (36.6 C)  TempSrc: Oral Oral  Oral  SpO2: 99% 99%  100%  Weight:   80.6 kg (177 lb 11.1 oz)   Height:        Intake/Output Summary (Last 24 hours) at 07/13/16 1922 Last data filed at 07/13/16 1843  Gross per 24 hour  Intake          4096.34 ml  Output             1875  ml  Net          2221.34 ml   Filed Weights   07/03/16 0540 07/09/16 1509 07/13/16 1108  Weight: 87.4 kg (192 lb 10.9 oz) 78.5 kg (173 lb) 80.6 kg (177 lb 11.1 oz)    Examination:  General exam:  Adult Male.  No acute distress.  NG tube removed HEENT:  NCAT, MMM Respiratory system: Clear to auscultation bilaterally Cardiovascular system: Regular rate and rhythm, normal S1/S2. No murmurs, rubs, gallops or clicks.  Warm extremities Gastrointestinal system: normal active bowel sounds, soft, still distended over the right upper and lower quadrants, nontender. MSK:  Normal tone and bulk, no lower extremity edema Neuro:  Grossly intact    Data Reviewed: I have personally reviewed following labs and imaging studies  CBC:  Recent Labs Lab  07/09/16 0524 07/10/16 0748 07/11/16 0610 07/12/16 0500 07/13/16 0457  WBC 21.7* 20.1* 17.3* 19.0* 18.5*  NEUTROABS  --   --  10.5* 11.6*  --   HGB 16.3 14.2 12.8* 12.2* 12.2*  HCT 45.6 40.8 36.5* 35.1* 35.8*  MCV 83.8 85.4 85.1 86.0 86.1  PLT 421* 439* 388 353 [id number]   Basic Metabolic Panel:  Recent Labs Lab 07/09/16 0524 07/10/16 0748 07/11/16 0610 07/12/16 0500 07/13/16 0457  NA 136 137 136 136 133*  K 3.9 3.2* 3.4* 4.1 4.0  CL 93* 101 103 107 106  CO2 32 25 25 23 22   GLUCOSE 160* 127* 130* 128* 132*  BUN 51* 46* 34* 20 16  CREATININE 1.82* 1.10 0.80 0.79 0.68  CALCIUM 9.9 8.9 8.4* 8.6* 8.6*  MG  --   --  1.5* 1.8 1.8  PHOS  --   --  1.4* 1.9* 2.4*   GFR: Estimated Creatinine Clearance: 87.6 mL/min (by C-G formula based on SCr of 0.68 mg/dL). Liver Function Tests:  Recent Labs Lab 07/11/16 0610 07/12/16 0500  AST 26 27  ALT 10* 10*  ALKPHOS 41 44  BILITOT 0.5 0.5  PROT 5.6* 5.6*  ALBUMIN 2.8* 2.8*   No results for input(s): LIPASE, AMYLASE in the last 168 hours. No results for input(s): AMMONIA in the last 168 hours. Coagulation Profile: No results for input(s): INR, PROTIME in the last 168 hours. Cardiac  Enzymes: No results for input(s): CKTOTAL, CKMB, CKMBINDEX, TROPONINI in the last 168 hours. BNP (last 3 results) No results for input(s): PROBNP in the last 8760 hours. HbA1C: No results for input(s): HGBA1C in the last 72 hours. CBG:  Recent Labs Lab 07/12/16 2349 07/13/16 0601 07/13/16 0757 07/13/16 1131 07/13/16 1707  GLUCAP 113* 128* 117* 113* 125*   Lipid Profile:  Recent Labs  07/11/16 0609 07/12/16 0500  TRIG 163* 172*   Thyroid Function Tests: No results for input(s): TSH, T4TOTAL, FREET4, T3FREE, THYROIDAB in the last 72 hours. Anemia Panel: No results for input(s): VITAMINB12, FOLATE, FERRITIN, TIBC, IRON, RETICCTPCT in the last 72 hours. Urine analysis:    Component Value Date/Time   COLORURINE YELLOW 07/02/2016 2134   APPEARANCEUR CLEAR 07/02/2016 2134   APPEARANCEUR Cloudy (A) 06/16/2016 1131   LABSPEC 1.021 07/02/2016 2134   PHURINE 5.0 07/02/2016 2134   GLUCOSEU NEGATIVE 07/02/2016 2134   HGBUR SMALL (A) 07/02/2016 2134   BILIRUBINUR NEGATIVE 07/02/2016 2134   BILIRUBINUR Negative 06/16/2016 [place] 07/02/2016 2134   PROTEINUR NEGATIVE 07/02/2016 2134   NITRITE NEGATIVE 07/02/2016 2134   LEUKOCYTESUR TRACE (A) 07/02/2016 2134   LEUKOCYTESUR 3+ (A) 06/16/2016 1131   Sepsis Labs: @LABRCNTIP (procalcitonin:4,lacticidven:4)  ) No results found for this or any previous visit (from the past 240 hour(s)).    Radiology Studies: Dg Abd Portable 1v  Result Date: 07/12/2016 CLINICAL DATA:  Small bowel obstruction, followup EXAM: PORTABLE ABDOMEN - 1 VIEW COMPARISON:  07/11/2016 FINDINGS: Nasogastric tube projects over stomach. Retained contrast in colon. Air-filled loops of small bowel in the RIGHT mid abdomen slightly decreased in caliber since previous study. No bowel wall thickening or free intraperitoneal air. Bones demineralized. Surgical clips RIGHT upper quadrant question cholecystectomy. IMPRESSION: Decreased small bowel  distention since previous study. Electronically Signed   By: Lavonia Dana M.D.   On: 07/12/2016 09:21     Scheduled Meds: .  ceFAZolin (ANCEF) IV  2 g Intravenous Q8H  . feeding supplement  1 Container Oral TID BM  . Gerhardt's  butt cream   Topical TID  . heparin subcutaneous  5,000 Units Subcutaneous Q8H  . Influenza vac split quadrivalent PF  0.5 mL Intramuscular Tomorrow-1000  . insulin aspart  0-9 Units Subcutaneous Q4H  . LORazepam  1 mg Intravenous QHS  . metoprolol  5 mg Intravenous Q6H  . pantoprazole (PROTONIX) IV  40 mg Intravenous Q12H   Continuous Infusions: . dextrose 5 % and 0.45 % NaCl with KCl 40 mEq/L 85 mL/hr at 07/13/16 0905  . TPN (CLINIMIX) Adult without lytes 40 mL/hr at 07/13/16 1749   And  . fat emulsion 240 mL (07/13/16 1749)     LOS: 10 days    Time spent: 30 min    Janece Canterbury, MD [facility] Pager [phone]  If 7PM-7AM, please contact night-coverage [email or web address] Password [facility] 07/13/2016, 7:22 PM

## 2016-07-13 NOTE — Care Management Important Message (Signed)
Important Message  Patient Details  Name: BARTEK FANJOY MRN: [id number] Date of Birth: November 04, 1940   Medicare Important Message Given:  Yes    Kerin Salen 07/13/2016, 2:02 [place] Message  Patient Details  Name: DURWARD GARCIALOPEZ MRN: [id number] Date of Birth: 10-Jan-1941   Medicare Important Message Given:  Yes    Kerin Salen 07/13/2016, 2:02 PM

## 2016-07-13 NOTE — Progress Notes (Signed)
Patient ID: Dustin Dominguez, male   DOB: Nov 20, 1940, 75 y.o.   MRN: [id number]  [facility] Surgery Progress Note     Subjective: Feeling a little better today. Continues to have some right sided abdominal pain. Denies n/v. Had BM this morning and is passing flatus. WBC still elevated 18.5, afebrile. Denies cough or SOB. Reports increased urinary frequency yesterday but denies dysuria.  Objective: Vital signs in last 24 hours: Temp:  [98.6 F (37 C)-98.9 F (37.2 C)] 98.6 F (37 C) (12/26 0604) Pulse Rate:  [64-72] 72 (12/26 0604) Resp:  [17-18] 18 (12/26 0604) BP: (156-172)/(66-82) 172/82 (12/26 0604) SpO2:  [99 %] 99 % (12/26 0604) Last BM Date: 07/11/16  Intake/Output from previous day: 12/25 0701 - 12/26 0700 In: 4440.3 [I.V.:3285.3; IV Piggyback:1155] Out: [id number] [Urine:1625; Emesis/NG output:100] Intake/Output this shift: No intake/output data recorded.  PE: Gen: Alert, NAD, pleasant Card: RRR, no M/G/R heard Pulm: CTAB, no W/R/R, effort normal Abd: multiple well healed incisions, soft, ND, mild right sided tenderness, +BS Ext: No erythema, edema, or tenderness   Lab Results:   Recent Labs  07/12/16 0500 07/13/16 0457  WBC 19.0* 18.5*  HGB 12.2* 12.2*  HCT 35.1* 35.8*  PLT 353 350   BMET  Recent Labs  07/12/16 0500 07/13/16 0457  NA 136 133*  K 4.1 4.0  CL 107 106  CO2 23 22  GLUCOSE 128* 132*  BUN 20 16  CREATININE 0.79 0.68  CALCIUM 8.6* 8.6*   PT/INR No results for input(s): LABPROT, INR in the last 72 hours. CMP     Component Value Date/Time   NA 133 (L) 07/13/2016 0457   NA 140 06/16/2016 1159   K 4.0 07/13/2016 0457   CL 106 07/13/2016 0457   CO2 22 07/13/2016 0457   GLUCOSE 132 (H) 07/13/2016 0457   BUN 16 07/13/2016 0457   BUN 15 06/16/2016 1159   CREATININE 0.68 07/13/2016 0457   CREATININE 1.10 01/11/2013 1626   CALCIUM 8.6 (L) 07/13/2016 0457   PROT 5.6 (L) 07/12/2016 0500   PROT 6.9 06/16/2016 1159   ALBUMIN 2.8  (L) 07/12/2016 0500   ALBUMIN 3.6 06/16/2016 1159   AST 27 07/12/2016 0500   ALT 10 (L) 07/12/2016 0500   ALKPHOS 44 07/12/2016 0500   BILITOT 0.5 07/12/2016 0500   BILITOT 0.6 06/16/2016 1159   GFRNONAA >60 07/13/2016 0457   GFRNONAA 68 01/11/2013 1626   GFRAA >60 07/13/2016 0457   GFRAA 78 01/11/2013 1626   Lipase  No results found for: LIPASE     Studies/Results: Dg Abd 2 Views  Result Date: 07/11/2016 CLINICAL DATA:  Small bowel obstruction EXAM: ABDOMEN - 2 VIEW COMPARISON:  07/10/2016, 07/09/2016 FINDINGS: Several loops of dilated small bowel again noted without significant interval change. There is air and contrast within the colon. Air in the rectum noted. Upright exam demonstrates scattered air-fluid levels in the small bowel and colon. Appearance compatible with residual persistent partial small bowel obstruction. No free air evident. IMPRESSION: Persistent small bowel dilatation and air-fluid levels but air and contrast within the colon compatible with persistent partial obstruction pattern. Electronically Signed   By: Jerilynn Mages.  Shick M.D.   On: 07/11/2016 10:34   Dg Abd Portable 1v  Result Date: 07/12/2016 CLINICAL DATA:  Small bowel obstruction, followup EXAM: PORTABLE ABDOMEN - 1 VIEW COMPARISON:  07/11/2016 FINDINGS: Nasogastric tube projects over stomach. Retained contrast in colon. Air-filled loops of small bowel in the RIGHT mid abdomen slightly decreased in caliber  since previous study. No bowel wall thickening or free intraperitoneal air. Bones demineralized. Surgical clips RIGHT upper quadrant question cholecystectomy. IMPRESSION: Decreased small bowel distention since previous study. Electronically Signed   By: Lavonia Dana M.D.   On: 07/12/2016 09:21    Anti-infectives: Anti-infectives    Start     Dose/Rate Route Frequency Ordered Stop   07/08/16 1200  ceFAZolin (ANCEF) IVPB 2g/100 mL premix     2 g 200 mL/hr over 30 Minutes Intravenous Every 8 hours 07/08/16 0950      07/03/16 1200  vancomycin (VANCOCIN) IVPB 1000 mg/200 mL premix  Status:  Discontinued     1,000 mg 200 mL/hr over 60 Minutes Intravenous Every 12 hours 07/03/16 0305 07/07/16 1636   07/03/16 1200  piperacillin-tazobactam (ZOSYN) IVPB 3.375 g  Status:  Discontinued     3.375 g 100 mL/hr over 30 Minutes Intravenous Every 6 hours 07/03/16 0951 07/03/16 0958   07/03/16 1200  piperacillin-tazobactam (ZOSYN) IVPB 3.375 g  Status:  Discontinued     3.375 g 12.5 mL/hr over 240 Minutes Intravenous Every 8 hours 07/03/16 1000 07/08/16 0950   07/03/16 1100  ceFEPIme (MAXIPIME) 1 g in dextrose 5 % 50 mL IVPB  Status:  Discontinued     1 g 100 mL/hr over 30 Minutes Intravenous Every 12 hours 07/03/16 0305 07/03/16 0951   07/03/16 0015  vancomycin (VANCOCIN) IVPB 1000 mg/200 mL premix     1,000 mg 200 mL/hr over 60 Minutes Intravenous  Once 07/03/16 0001 07/03/16 0306   07/03/16 0015  ceFEPIme (MAXIPIME) 2 g in dextrose 5 % 50 mL IVPB     2 g 100 mL/hr over 30 Minutes Intravenous  Once 07/03/16 0002 07/03/16 0152       Assessment/Plan SBO likely 2/2 adhesions - history of multiple abdominal surgeries including splenectomy, right adrenal mass excision, open cholecystectomy - unknown when last BM or flatus occurred - CT scan 12/20 shows SBO with transition point along the right anterior mid abdomen - NGT placed 12/20 with high output - XR 12/25 showed decreased small bowel distention - NG pulled 12/25. Having BM's and passing flatus.  Constipation Scrotal abscess s/p I&D 07/03/16, on ancef DM HTN CAD  ID - ancef 12/21>> FEN - IVF, TPN, clear liquids VTE - SCDs  Plan - Advance to clear liquid diet today. Continue TPN. Continue to encourage ambulation/mobilization. Check UA due to continued WBC elevation and new onset urinary frequency.   LOS: 10 days    Jerrye Beavers , [facility] Surgery 07/13/2016, 8:49 AM Pager: [phone] Consults: [phone] Mon-Fri 7:00  am-4:30 pm Sat-Sun 7:00 am-11:30 am  Agree with above. Sitting in large BM right now.  He's having a little bit of trouble with control. SBO appears resolved. May advance diet.  Alphonsa Overall, MD, [facility] Surgery Pager: [phone] Office phone:  [phone]

## 2016-07-14 DIAGNOSIS — I70209 Unspecified atherosclerosis of native arteries of extremities, unspecified extremity: Secondary | ICD-10-CM

## 2016-07-14 DIAGNOSIS — I10 Essential (primary) hypertension: Secondary | ICD-10-CM

## 2016-07-14 DIAGNOSIS — N492 Inflammatory disorders of scrotum: Principal | ICD-10-CM

## 2016-07-14 DIAGNOSIS — E1151 Type 2 diabetes mellitus with diabetic peripheral angiopathy without gangrene: Secondary | ICD-10-CM

## 2016-07-14 DIAGNOSIS — I25119 Atherosclerotic heart disease of native coronary artery with unspecified angina pectoris: Secondary | ICD-10-CM

## 2016-07-14 LAB — CBC
HCT: 35.8 % — ABNORMAL LOW (ref 39.0–52.0)
Hemoglobin: 12.4 g/dL — ABNORMAL LOW (ref 13.0–17.0)
MCH: 29.7 pg (ref 26.0–34.0)
MCHC: 34.6 g/dL (ref 30.0–36.0)
MCV: 85.9 fL (ref 78.0–100.0)
PLATELETS: 380 10*3/uL (ref 150–400)
RBC: 4.17 MIL/uL — ABNORMAL LOW (ref 4.22–5.81)
RDW: 14.5 % (ref 11.5–15.5)
WBC: 19.6 10*3/uL — ABNORMAL HIGH (ref 4.0–10.5)

## 2016-07-14 LAB — MAGNESIUM: MAGNESIUM: 1.8 mg/dL (ref 1.7–2.4)

## 2016-07-14 LAB — URINALYSIS, ROUTINE W REFLEX MICROSCOPIC
BILIRUBIN URINE: NEGATIVE
Bacteria, UA: NONE SEEN
Glucose, UA: 50 mg/dL — AB
Hgb urine dipstick: NEGATIVE
KETONES UR: NEGATIVE mg/dL
LEUKOCYTES UA: NEGATIVE
Nitrite: NEGATIVE
PH: 5 (ref 5.0–8.0)
Protein, ur: 30 mg/dL — AB
Specific Gravity, Urine: 1.011 (ref 1.005–1.030)

## 2016-07-14 LAB — BASIC METABOLIC PANEL
Anion gap: 6 (ref 5–15)
BUN: 16 mg/dL (ref 6–20)
CALCIUM: 8.4 mg/dL — AB (ref 8.9–10.3)
CO2: 22 mmol/L (ref 22–32)
CREATININE: 0.69 mg/dL (ref 0.61–1.24)
Chloride: 106 mmol/L (ref 101–111)
GFR calc non Af Amer: >60 mL/min (ref >60)
GLUCOSE: 119 mg/dL — AB (ref 65–99)
Potassium: 3.5 mmol/L (ref 3.5–5.1)
Sodium: 134 mmol/L — ABNORMAL LOW (ref 135–145)

## 2016-07-14 LAB — GLUCOSE, CAPILLARY
GLUCOSE-CAPILLARY: 150 mg/dL — AB (ref 65–99)
Glucose-Capillary: 107 mg/dL — ABNORMAL HIGH (ref 65–99)
Glucose-Capillary: 120 mg/dL — ABNORMAL HIGH (ref 65–99)
Glucose-Capillary: 119 mg/dL — ABNORMAL HIGH (ref 65–99)
Glucose-Capillary: 133 mg/dL — ABNORMAL HIGH (ref 65–99)

## 2016-07-14 LAB — PHOSPHORUS: Phosphorus: 2.6 mg/dL (ref 2.5–4.6)

## 2016-07-14 MED ORDER — ATORVASTATIN CALCIUM 10 MG PO TABS
20.0000 mg | ORAL_TABLET | Freq: Every day | ORAL | Status: DC
  Administered 2016-07-14 – 2016-07-15 (×2): 20 mg via ORAL
  Filled 2016-07-14 (×2): qty 2

## 2016-07-14 MED ORDER — DOXYCYCLINE HYCLATE 100 MG PO TABS
100.0000 mg | ORAL_TABLET | Freq: Two times a day (BID) | ORAL | Status: DC
  Administered 2016-07-14: 100 mg via ORAL
  Filled 2016-07-14: qty 1

## 2016-07-14 MED ORDER — MAGNESIUM SULFATE 2 GM/50ML IV SOLN: 2.0000 g | Freq: Once | INTRAVENOUS | Status: DC

## 2016-07-14 MED ORDER — METOPROLOL TARTRATE 25 MG PO TABS
25.0000 mg | ORAL_TABLET | Freq: Two times a day (BID) | ORAL | Status: DC
  Administered 2016-07-14 – 2016-07-15 (×2): 25 mg via ORAL
  Filled 2016-07-14 (×2): qty 1

## 2016-07-14 MED ORDER — AMLODIPINE BESYLATE 10 MG PO TABS
10.0000 mg | ORAL_TABLET | Freq: Every day | ORAL | Status: DC
  Administered 2016-07-14 – 2016-07-15 (×2): 10 mg via ORAL
  Filled 2016-07-14 (×2): qty 1

## 2016-07-14 MED ORDER — INSULIN ASPART 100 UNIT/ML ~~LOC~~ SOLN
0.0000 [IU] | Freq: Three times a day (TID) | SUBCUTANEOUS | Status: DC
  Administered 2016-07-14: 1 [IU] via SUBCUTANEOUS

## 2016-07-14 MED ORDER — ENSURE ENLIVE PO LIQD
237.0000 mL | Freq: Three times a day (TID) | ORAL | Status: DC
  Administered 2016-07-14 – 2016-07-15 (×3): 237 mL via ORAL

## 2016-07-14 MED ORDER — SODIUM PHOSPHATES 45 MMOLE/15ML IV SOLN: 15.0000 mmol | Freq: Once | INTRAVENOUS | Status: DC

## 2016-07-14 NOTE — Progress Notes (Signed)
[facility]  PROGRESS NOTE  Dustin Dominguez [id number] DOB: September 12, 1940 DOA: 07/02/2016 PCP: Redge Gainer, MD   Brief HPI:   75 year old male with history of diabetes mellitus type 2, chronic pain, coronary artery disease, hypertension who presented to the emergency department with worsening pain and swelling of the scrotum was present for 3 weeks prior to admission. The area was draining purulent fluid and became acutely worse 4-5 days prior to admission. He underwent I&D of scrotal abscess by urology on 12/16 the cultures of which grew Klebsiella. Postoperatively, he developed severe constipation. Gastroenterology was consulted because the patient had little results with cathartics. Imaging demonstrated an SBO with a transition zone in general surgery was consulted. He had an NG tube placed to low intermittent suction and has been receiving laxatives for severe constipation.  Improved bowel sounds, had spontaneous bowel movement yesterday, feeling better. X-ray shows improvement in bowel obstruction.     Subjective   Patient seen and examined, feels better. Started on soft diet.   Assessment/Plan:     1. Scrotal wall abscess to the Klebsiella- culture from abscess grew Klebsiella sensitive to cefazolin. Continue cefazolin through 07/16/2016. Continue dressing changes as appended per urology. 2. SBO due to adhesions - patient has had splenectomy, right adrenal mass excision and open cholecystectomy in the past. Developed small bowel obstruction, was seen by gastroenterology and general surgery. PICC line with TPN was placed. Now the diet has been advanced to soft diet and TPN discontinued. 3. Diarrhea- likely due to resolving small bowel obstruction, C. difficile PCR is negative 4. Acute kidney injury- from dehydration, creatinine is back to 0.69 at baseline. 5. Diabetes mellitus- continue sliding scale insulin with NovoLog. 6. Hypertension- blood pressure is mildly elevated,  will restart amlodipine and metoprolol. DC IV metoprolol. 7. CAD status post CABG- last [facility] April 2017 which showed severe two-vessel disease but patent bypasses. Patient remains asymptomatic. 8. Chest pain and anxiety- patient on MS Contin 30 mg 3 times a day, Percocet and Valium at home. Continue IV morphine when necessary, Ativan in the hospital. 9. Severe protein calorie malnutrition- PICC line with TNA started. No TNA discontinued and patient started on soft diet.     DVT prophylaxis: Heparin  Code Status:  Full code  Family Communication: No family present at bedside  Disposition Plan:  Home in next 24-48 hours   Consultants:   Gen. Surgery  Gastroenterology  urology  Procedures:  Incision and drainage of scrotal wall abscess on 07/03/2016  Continuous infusions . dextrose 5 % and 0.45 % NaCl with KCl 40 mEq/L 85 mL/hr at 07/13/16 0905  . TPN (CLINIMIX) Adult without lytes 40 mL/hr at 07/13/16 1749   And  . fat emulsion 240 mL (07/13/16 1749)      Antibiotics:   Anti-infectives    Start     Dose/Rate Route Frequency Ordered Stop   07/08/16 1200  ceFAZolin (ANCEF) IVPB 2g/100 mL premix     2 g 200 mL/hr over 30 Minutes Intravenous Every 8 hours 07/08/16 0950     07/03/16 1200  vancomycin (VANCOCIN) IVPB 1000 mg/200 mL premix  Status:  Discontinued     1,000 mg 200 mL/hr over 60 Minutes Intravenous Every 12 hours 07/03/16 0305 07/07/16 1636   07/03/16 1200  piperacillin-tazobactam (ZOSYN) IVPB 3.375 g  Status:  Discontinued     3.375 g 100 mL/hr over 30 Minutes Intravenous Every 6 hours 07/03/16 0951 07/03/16 0958   07/03/16 1200  piperacillin-tazobactam (ZOSYN) IVPB 3.375  g  Status:  Discontinued     3.375 g 12.5 mL/hr over 240 Minutes Intravenous Every 8 hours 07/03/16 1000 07/08/16 0950   07/03/16 1100  ceFEPIme (MAXIPIME) 1 g in dextrose 5 % 50 mL IVPB  Status:  Discontinued     1 g 100 mL/hr over 30 Minutes Intravenous Every 12 hours 07/03/16 0305  07/03/16 0951   07/03/16 0015  vancomycin (VANCOCIN) IVPB 1000 mg/200 mL premix     1,000 mg 200 mL/hr over 60 Minutes Intravenous  Once 07/03/16 0001 07/03/16 0306   07/03/16 0015  ceFEPIme (MAXIPIME) 2 g in dextrose 5 % 50 mL IVPB     2 g 100 mL/hr over 30 Minutes Intravenous  Once 07/03/16 0002 07/03/16 0152       Objective   Vitals:   07/13/16 2023 07/14/16 0215 07/14/16 0534 07/14/16 1523  BP: (!) 187/76  (!) 172/80 (!) 149/66  Pulse:  74 73 91  Resp: 16  16 20   Temp: 98.7 F (37.1 C)  98.6 F (37 C) 98.5 F (36.9 C)  TempSrc: Oral  Oral Oral  SpO2:  100% 99% 98%  Weight:      Height:        Intake/Output Summary (Last 24 hours) at 07/14/16 1732 Last data filed at 07/14/16 1551  Gross per 24 hour  Intake          1763.84 ml  Output             1900 ml  Net          -136.16 ml   Filed Weights   07/03/16 0540 07/09/16 1509 07/13/16 1108  Weight: 87.4 kg (192 lb 10.9 oz) 78.5 kg (173 lb) 80.6 kg (177 lb 11.1 oz)     Physical Examination:  General exam: Appears calm and comfortable. Respiratory system: Clear to auscultation. Respiratory effort normal. Cardiovascular system:  RRR. No  murmurs, rubs, gallops. No pedal edema. GI system: Abdomen is nondistended, soft and nontender. No organomegaly.  Central nervous system. No focal neurological deficits. 5 x 5 power in all extremities. Skin: Scrotal skin mildly edematous with packing in place, mild erythema. Nontender to palpation Psychiatry: Alert, oriented x 3.Judgement and insight appear normal. Affect normal.    Data Reviewed: I have personally reviewed following labs and imaging studies  CBG:  Recent Labs Lab 07/13/16 2329 07/14/16 0329 07/14/16 0743 07/14/16 1231 07/14/16 1652  GLUCAP 106* 107* 120* 119* 133*    CBC:  Recent Labs Lab 07/10/16 0748 07/11/16 0610 07/12/16 0500 07/13/16 0457 07/14/16 0423  WBC 20.1* 17.3* 19.0* 18.5* 19.6*  NEUTROABS  --  10.5* 11.6*  --   --   HGB 14.2  12.8* 12.2* 12.2* 12.4*  HCT 40.8 36.5* 35.1* 35.8* 35.8*  MCV 85.4 85.1 86.0 86.1 85.9  PLT 439* 388 353 350 [id number]    Basic Metabolic Panel:  Recent Labs Lab 07/10/16 0748 07/11/16 0610 07/12/16 0500 07/13/16 0457 07/14/16 0423  NA 137 136 136 133* 134*  K 3.2* 3.4* 4.1 4.0 3.5  CL 101 103 107 106 106  CO2 25 25 23 22 22   GLUCOSE 127* 130* 128* 132* 119*  BUN 46* 34* 20 16 16   CREATININE 1.10 0.80 0.79 0.68 0.69  CALCIUM 8.9 8.4* 8.6* 8.6* 8.4*  MG  --  1.5* 1.8 1.8 1.8  PHOS  --  1.4* 1.9* 2.4* 2.6    Recent Results (from the past 240 hour(s))  C difficile quick scan w PCR  reflex     Status: None   Collection Time: 07/13/16  7:33 PM  Result Value Ref Range Status   C Diff antigen NEGATIVE NEGATIVE Final   C Diff toxin NEGATIVE NEGATIVE Final   C Diff interpretation No C. difficile detected.  Final     Liver Function Tests:  Recent Labs Lab 07/11/16 0610 07/12/16 0500  AST 26 27  ALT 10* 10*  ALKPHOS 41 44  BILITOT 0.5 0.5  PROT 5.6* 5.6*  ALBUMIN 2.8* 2.8*      Studies: No results found.  Scheduled Meds: .  ceFAZolin (ANCEF) IV  2 g Intravenous Q8H  . feeding supplement (ENSURE ENLIVE)  237 mL Oral TID BM  . Gerhardt's butt cream   Topical TID  . heparin subcutaneous  5,000 Units Subcutaneous Q8H  . Influenza vac split quadrivalent PF  0.5 mL Intramuscular Tomorrow-1000  . insulin aspart  0-9 Units Subcutaneous TID WC  . LORazepam  1 mg Intravenous QHS  . metoprolol  5 mg Intravenous Q6H  . pantoprazole (PROTONIX) IV  40 mg Intravenous Q12H      Time spent: 25 min  [facility] Pager [phone]. If 7PM-7AM, please contact night-coverage at [email or web address], Office  [phone]  password [email or web address] 07/14/2016, 5:32 PM  LOS: 11 days

## 2016-07-14 NOTE — Progress Notes (Signed)
Spoke with patient at bedside. Discussed need for [facility] to assist with wound care, currently daily dressing changes. Discussed need for teachable caregiver, spouse will be assisting. Patient states they have used [facility] in the past and would like to use them again. Contacted [facility] for referral, they will follow for d/c needs. Anticipating d/c in am if tolerates diet.

## 2016-07-14 NOTE — Progress Notes (Signed)
PHARMACY - ADULT TOTAL PARENTERAL NUTRITION CONSULT NOTE   Pharmacy Consult for TPN Indication: SBO  Patient Measurements: Height: 5\' 11"  (180.3 cm) Weight: 177 lb 11.1 oz (80.6 kg) (bed scale) IBW/kg (Calculated) : 75.3 TPN AdjBW (KG): 87.4 Body mass index is 24.78 kg/m. Usual Weight: 87kg   Insulin Requirements: 4 units Novolog SSI in past 24 hours. No insulin in TPN bag  Current Nutrition: Soft diet starting 12/27  IVF: D5 1/2 NS w/ 64meq KCl/L @ 20 mL/hr  Central access: PICC placed 12/23 TPN start date: 12/24     Recent Labs  07/12/16 0500 07/13/16 0457 07/14/16 0423  NA 136 133* 134*  K 4.1 4.0 3.5  CL 107 106 106  CO2 23 22 22   GLUCOSE 128* 132* 119*  BUN 20 16 16   CREATININE 0.79 0.68 0.69  CALCIUM 8.6* 8.6* 8.4*  PHOS 1.9* 2.4* 2.6  MG 1.8 1.8 1.8  ALBUMIN 2.8*  --   --   ALKPHOS 44  --   --   AST 27  --   --   ALT 10*  --   --   BILITOT 0.5  --   --   TRIG 172*  --   --   PREALBUMIN 19.4  --   --   Corrected Ca: 9.6 on 1226  ASSESSMENT                                                                                                          HPI:  75 year old M with history of diabetes mellitus type 2, chronic pain, coronary artery disease, hypertension who presented to the ED with worsening pain and swelling of the scrotum present for 3 weeks prior to admission.  He underwent I&D.  Postoperatively he developed severe constipation and ultimately SBO.  Significant events:  12/24: TPN started; RD notes patient at high risk for refeeding syndrome.   12/25: Having BMs, KUB shows decreased SB distention; NGT removed.  12/27: SBO resolved per surgery; advancing diet, stopping TPN today  Today:   CBGs (goal 100 - 150): at goal on SSI ; Hx DM 2, controlled with diet and Amaryl PTA   Electrolytes - Na still sl low and Phos lower end of normal after repletion yesterday; Mg unchanged at 1.8 after 2g yesterday. Other lytes wnl; K lower with decrease in IVF  rate  Renal - SCr WNL, stable  LFTs - all below ULN (12/25)  TGs - 163 (12/24), 172 (12/25) - acceptable  Prealbumin - 22.0 (12/24), 19.4 (12/25) - WNL  NUTRITIONAL GOALS  RD recs (12/22): 2000-2400 Kcal/day,  110-125g protein/day Clinimix 5/15 at a goal rate of 65ml/hr + 20% fat emulsion at 11ml/hr would provide: 100 g/day protein, 1900 Kcal/day.   PLAN                                                No boluses today as lytes wnl, but will order BMP, Mg, Phos for tomorrow as I expect some time to regain full diet after stopping TPN  Will stop TPN when bag taken down at 1800 today   Reuel Boom, PharmD, BCPS Pager: [phone] 07/14/2016, 11:03 AM

## 2016-07-14 NOTE — Progress Notes (Signed)
Patient ID: Dustin Dominguez, male   DOB: Dec 12, 1940, 75 y.o.   MRN: [id number]  [facility] Surgery Progress Note     Subjective: Feeling well this morning. Denies any abdominal pain. Continues to have loose BM's and pass flatus. Tolerating clear liquids. States that he feels hungry.  Objective: Vital signs in last 24 hours: Temp:  [97.9 F (36.6 C)-98.7 F (37.1 C)] 98.6 F (37 C) (12/27 0534) Pulse Rate:  [68-83] 73 (12/27 0534) Resp:  [16-18] 16 (12/27 0534) BP: (164-187)/(76-80) 172/80 (12/27 0534) SpO2:  [99 %-100 %] 99 % (12/27 0534) Weight:  [177 lb 11.1 oz (80.6 kg)] 177 lb 11.1 oz (80.6 kg) (12/26 1108) Last BM Date: 07/14/16  Intake/Output from previous day: 12/26 0701 - 12/27 0700 In: 2013.8 [P.O.:240; I.V.:1623.8; IV Piggyback:150] Out: 2000 [Urine:2000] Intake/Output this shift: No intake/output data recorded.  PE: Gen: Alert, NAD, pleasant Pulm: effort normal Abd: multiple well healed incisions, soft, ND, NT, +BS Ext: No erythema, edema, or tenderness  Lab Results:   Recent Labs  07/13/16 0457 07/14/16 0423  WBC 18.5* 19.6*  HGB 12.2* 12.4*  HCT 35.8* 35.8*  PLT 350 380   BMET  Recent Labs  07/13/16 0457 07/14/16 0423  NA 133* 134*  K 4.0 3.5  CL 106 106  CO2 22 22  GLUCOSE 132* 119*  BUN 16 16  CREATININE 0.68 0.69  CALCIUM 8.6* 8.4*   PT/INR No results for input(s): LABPROT, INR in the last 72 hours. CMP     Component Value Date/Time   NA 134 (L) 07/14/2016 0423   NA 140 06/16/2016 1159   K 3.5 07/14/2016 0423   CL 106 07/14/2016 0423   CO2 22 07/14/2016 0423   GLUCOSE 119 (H) 07/14/2016 0423   BUN 16 07/14/2016 0423   BUN 15 06/16/2016 1159   CREATININE 0.69 07/14/2016 0423   CREATININE 1.10 01/11/2013 1626   CALCIUM 8.4 (L) 07/14/2016 0423   PROT 5.6 (L) 07/12/2016 0500   PROT 6.9 06/16/2016 1159   ALBUMIN 2.8 (L) 07/12/2016 0500   ALBUMIN 3.6 06/16/2016 1159   AST 27 07/12/2016 0500   ALT 10 (L) 07/12/2016  0500   ALKPHOS 44 07/12/2016 0500   BILITOT 0.5 07/12/2016 0500   BILITOT 0.6 06/16/2016 1159   GFRNONAA >60 07/14/2016 0423   GFRNONAA 68 01/11/2013 1626   GFRAA >60 07/14/2016 0423   GFRAA 78 01/11/2013 1626   Lipase  No results found for: LIPASE     Studies/Results: No results found.  Anti-infectives: Anti-infectives    Start     Dose/Rate Route Frequency Ordered Stop   07/08/16 1200  ceFAZolin (ANCEF) IVPB 2g/100 mL premix     2 g 200 mL/hr over 30 Minutes Intravenous Every 8 hours 07/08/16 0950     07/03/16 1200  vancomycin (VANCOCIN) IVPB 1000 mg/200 mL premix  Status:  Discontinued     1,000 mg 200 mL/hr over 60 Minutes Intravenous Every 12 hours 07/03/16 0305 07/07/16 1636   07/03/16 1200  piperacillin-tazobactam (ZOSYN) IVPB 3.375 g  Status:  Discontinued     3.375 g 100 mL/hr over 30 Minutes Intravenous Every 6 hours 07/03/16 0951 07/03/16 0958   07/03/16 1200  piperacillin-tazobactam (ZOSYN) IVPB 3.375 g  Status:  Discontinued     3.375 g 12.5 mL/hr over 240 Minutes Intravenous Every 8 hours 07/03/16 1000 07/08/16 0950   07/03/16 1100  ceFEPIme (MAXIPIME) 1 g in dextrose 5 % 50 mL IVPB  Status:  Discontinued  1 g 100 mL/hr over 30 Minutes Intravenous Every 12 hours 07/03/16 0305 07/03/16 0951   07/03/16 0015  vancomycin (VANCOCIN) IVPB 1000 mg/200 mL premix     1,000 mg 200 mL/hr over 60 Minutes Intravenous  Once 07/03/16 0001 07/03/16 0306   07/03/16 0015  ceFEPIme (MAXIPIME) 2 g in dextrose 5 % 50 mL IVPB     2 g 100 mL/hr over 30 Minutes Intravenous  Once 07/03/16 0002 07/03/16 0152       Assessment/Plan SBO likely 2/2 adhesions - history of multiple abdominal surgeries including splenectomy, right adrenal mass excision, open cholecystectomy - unknown when last BM or flatus occurred - CT scan 12/20 shows SBO with transition point along the right anterior mid abdomen - SBO resolving  Scrotal abscess s/p I&D 07/03/16, on ancef DM HTN CAD  ID  - ancef 12/21>> FEN - IVF, soft  VTE - SCDs  Plan - SBO resolved. Advance to soft diet and d/c TPN. Continue to encourage ambulation/mobilization.  Leukocytosis does not appear to be related to abdomen. UA pending.  General surgery will sign off. Please call with any issues. Patient does not need surgical follow-up.    LOS: 11 days    Jerrye Beavers , [facility] Surgery 07/14/2016, 8:44 AM Pager: [phone] Consults: [phone] Mon-Fri 7:00 am-4:30 pm Sat-Sun 7:00 am-11:30 am  Agree with above. The patient feels good.  Some control issues.  He has a box of doughnuts at the bedside.  Not sure how this fits in his diet.  Alphonsa Overall, MD, [facility] [place] [facility] Pager: [phone] Office phone:  [phone]

## 2016-07-15 ENCOUNTER — Inpatient Hospital Stay (HOSPITAL_COMMUNITY): Payer: Medicare Other

## 2016-07-15 DIAGNOSIS — K56609 Unspecified intestinal obstruction, unspecified as to partial versus complete obstruction: Secondary | ICD-10-CM

## 2016-07-15 DIAGNOSIS — E559 Vitamin D deficiency, unspecified: Secondary | ICD-10-CM

## 2016-07-15 LAB — CBC
HCT: 35.5 % — ABNORMAL LOW (ref 39.0–52.0)
HEMOGLOBIN: 12.4 g/dL — AB (ref 13.0–17.0)
MCH: 30 pg (ref 26.0–34.0)
MCHC: 34.9 g/dL (ref 30.0–36.0)
MCV: 85.7 fL (ref 78.0–100.0)
Platelets: 369 10*3/uL (ref 150–400)
RBC: 4.14 MIL/uL — AB (ref 4.22–5.81)
RDW: 14.6 % (ref 11.5–15.5)
WBC: 21.9 10*3/uL — AB (ref 4.0–10.5)

## 2016-07-15 LAB — BASIC METABOLIC PANEL
ANION GAP: 7 (ref 5–15)
BUN: 22 mg/dL — AB (ref 6–20)
CHLORIDE: 109 mmol/L (ref 101–111)
CO2: 22 mmol/L (ref 22–32)
Calcium: 8.4 mg/dL — ABNORMAL LOW (ref 8.9–10.3)
Creatinine, Ser: 0.68 mg/dL (ref 0.61–1.24)
Glucose, Bld: 114 mg/dL — ABNORMAL HIGH (ref 65–99)
POTASSIUM: 3.8 mmol/L (ref 3.5–5.1)
SODIUM: 138 mmol/L (ref 135–145)

## 2016-07-15 LAB — GLUCOSE, CAPILLARY
GLUCOSE-CAPILLARY: 102 mg/dL — AB (ref 65–99)
Glucose-Capillary: 111 mg/dL — ABNORMAL HIGH (ref 65–99)

## 2016-07-15 LAB — PHOSPHORUS: PHOSPHORUS: 2.4 mg/dL — AB (ref 2.5–4.6)

## 2016-07-15 LAB — MAGNESIUM: MAGNESIUM: 1.4 mg/dL — AB (ref 1.7–2.4)

## 2016-07-15 MED ORDER — DOXYCYCLINE HYCLATE 100 MG PO TABS: 100.0000 mg | ORAL_TABLET | Freq: Two times a day (BID) | ORAL | 0 refills | Status: DC

## 2016-07-15 MED ORDER — DOXYCYCLINE HYCLATE 100 MG PO TABS: 100.0000 mg | ORAL_TABLET | Freq: Two times a day (BID) | ORAL | Status: DC

## 2016-07-15 MED ORDER — CEPHALEXIN 500 MG PO CAPS: 500.0000 mg | ORAL_CAPSULE | Freq: Two times a day (BID) | ORAL | 0 refills | Status: DC

## 2016-07-15 MED ORDER — PANTOPRAZOLE SODIUM 40 MG PO TBEC
40.0000 mg | DELAYED_RELEASE_TABLET | Freq: Two times a day (BID) | ORAL | Status: DC
  Administered 2016-07-15: 40 mg via ORAL
  Filled 2016-07-15: qty 1

## 2016-07-15 MED ORDER — SULFAMETHOXAZOLE-TRIMETHOPRIM 800-160 MG PO TABS
1.0000 | ORAL_TABLET | Freq: Two times a day (BID) | ORAL | Status: DC
  Administered 2016-07-15: 1 via ORAL
  Filled 2016-07-15 (×2): qty 1

## 2016-07-15 MED ORDER — ADULT MULTIVITAMIN LIQUID CH
15.0000 mL | Freq: Every day | ORAL | Status: DC
  Administered 2016-07-15: 15 mL via ORAL
  Filled 2016-07-15: qty 15

## 2016-07-15 NOTE — Discharge Summary (Signed)
Physician Discharge Summary  Dustin Dominguez [id number] DOB: [birth year] DOA: 07/02/2016  PCP: Redge Gainer, MD  Admit date: 07/02/2016 Discharge date: 07/15/2016  Time spent: 25* minutes  Recommendations for Outpatient Follow-up:   Follow up Urology in one week  Will be discharged on Keflex 500 mg by mouth twice a day for 2 more days  Will also start doxycycline 100 mg twice a day for 7 days, for UTI from coagulase-negative staph  Persistent leukocytosis, without fever. 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)   Vitamin D deficiency   Coronary artery disease involving native coronary artery of native heart with angina pectoris (HCC)   Cellulitis of scrotum   Abdominal pain   Malnutrition of moderate degree   Protein-calorie malnutrition, severe   SBO (small bowel obstruction)   Discharge Condition: Stable  Diet recommendation: Carb modified diet  Filed Weights   07/03/16 0540 07/09/16 1509 07/13/16 1108  Weight: 87.4 kg (192 lb 10.9 oz) 78.5 kg (173 lb) 80.6 kg (177 lb 11.1 oz)    History of present illness:  75 year old male with history of diabetes mellitus type 2, chronic pain, coronary artery disease, hypertension who presented to the emergency department with worsening pain and swelling of the scrotum was present for 3 weeks prior to admission. The area was draining purulent fluid and became acutely worse 4-5 days prior to admission. He underwent I&D of scrotal abscess by urology on 12/16 the cultures of which grew Klebsiella. Postoperatively, he developed severe constipation. Gastroenterology was consulted because the patient had little results with cathartics. Imaging  demonstrated an SBO with a transition zone in general surgery was consulted. He had an NG tube placed to low intermittent suction and has been receiving laxatives for severe constipation.  Improved bowel sounds, had spontaneous bowel movement yesterday, feeling better. X-ray shows improvement in bowel obstruction.  Hospital Course:  1. Scrotal wall abscess to the Klebsiella- culture from abscess grew Klebsiella sensitive to cefazolin. Continue cefazolin through 07/16/2016. Continue dressing changes as recommended per urology. Patient to follow-up urology in 1 week 2. SBO due to adhesions - patient has had splenectomy, right adrenal mass excision and open cholecystectomy in the past. Developed small bowel obstruction, was seen by gastroenterology and general surgery. PICC line with TPN was placed. Now the diet has been advanced to soft diet and TPN discontinued. 3. Diarrhea- likely due to resolving small bowel obstruction, C. difficile PCR is negative 4. Acute kidney injury- from dehydration, creatinine is back to 0.69 at baseline. 5. Diabetes mellitus- continue home medications 6. Hypertension- blood pressure is mildly elevated, will restart amlodipine and metoprolol. DC IV metoprolol. 7. CAD status post CABG- last [facility] April 2017 which showed severe two-vessel disease but patent bypasses. Patient remains asymptomatic. 8. Chest pain and anxiety- patient on MS Contin 30 mg 3 times a day, Percocet and Valium at home. 9. Severe protein calorie malnutrition- PICC line with TNA started. Now TNA discontinued and patient started on soft diet. 10. ? UTI/ Persistent leukocytosis- patient has persistent leukocytosis white count of 21,000, he has been afebrile. Repeat ultrasound of the scrotum was negative for residual abscess. I called and discussed with Dr. Johnnye Sima, will send him on  doxycycline 100 mg twice a day for 7 more days for possible UTI from coagulase-negative staph, though urine culture only grew  60,000 colonies of coag-negative staph.  Procedures:  Incision and drainage of scrotal wall abscess on 07/03/2016  Consultations:  General surgery  Gastroenterology  Urology  Discharge Exam: Vitals:   07/14/16 2047 07/15/16 0512  BP: (!) 142/71 (!) 150/70  Pulse: 99 71  Resp: 18 19  Temp: 98.8 F (37.1 C) 98.6 F (37 C)    General:Appears in no acute distress Cardiovascular: RRR, S1-S2 Respiratory: Clear to auscultation bilaterally  Discharge Instructions   Discharge Instructions    Diet - low sodium heart healthy    Complete by:  As directed    Increase activity slowly    Complete by:  As directed      Current Discharge Medication List    START taking these medications   Details  cephALEXin (KEFLEX) 500 MG capsule Take 1 capsule (500 mg total) by mouth 2 (two) times daily. Qty: 4 capsule, Refills: 0    doxycycline (VIBRA-TABS) 100 MG tablet Take 1 tablet (100 mg total) by mouth every 12 (twelve) hours. Qty: 14 tablet, Refills: 0      CONTINUE these medications which have NOT CHANGED   Details  aspirin 81 MG EC tablet Take 162 mg by mouth daily.     diazepam (VALIUM) 10 MG tablet Take 1 tablet (10 mg total) by mouth 2 (two) times daily as needed. Qty: 60 tablet, Refills: 4    fenofibrate 160 MG tablet TAKE 1 TABLET BY MOUTH DAILY Qty: 30 tablet, Refills: 4    fish oil-omega-3 fatty acids 1000 MG capsule Take 1 g by mouth daily.     glimepiride (AMARYL) 2 MG tablet TAKE 1 TABLET BY MOUTH EVERY DAY Qty: 30 tablet, Refills: 5    isosorbide mononitrate (IMDUR) 30 MG 24 hr tablet TAKE 1 TABLET BY MOUTH DAILY Qty: 30 tablet, Refills: 4    morphine (MS CONTIN) 30 MG 12 hr tablet Take 30 mg by mouth 3 (three) times daily. Refills: 0    oxyCODONE-acetaminophen (PERCOCET) 10-325 MG per tablet Take 1 tablet by mouth 6 (six) times daily.     quinapril (ACCUPRIL) 20 MG tablet TAKE 1 TABLET BY MOUTH EVERY DAY Qty: 30 tablet, Refills: 4    senna-docusate  (SENOKOT S) 8.6-50 MG tablet Take 1 tablet by mouth as needed for mild constipation.    sucralfate (CARAFATE) 1 g tablet Take 1 tablet (1 g total) by mouth 4 (four) times daily -  with meals and at bedtime. Qty: 120 tablet, Refills: 0    Vitamin D, Cholecalciferol, 1000 UNITS CAPS Take 1 capsule by mouth daily. Qty: 60 capsule    amLODipine (NORVASC) 10 MG tablet TAKE 1 TABLET BY MOUTH DAILY Qty: 30 tablet, Refills: 5    atorvastatin (LIPITOR) 20 MG tablet TAKE 1 TABLET BY MOUTH EVERY DAY Qty: 30 tablet, Refills: 5    hydrochlorothiazide (MICROZIDE) 12.5 MG capsule TAKE 1 CAPSULE BY MOUTH EVERY DAY Qty: 30 capsule, Refills: 5    metoprolol (LOPRESSOR) 50 MG tablet TAKE 1/2 TABLET BY MOUTH TWICE DAILY Qty: 30 tablet, Refills: 5    pantoprazole (PROTONIX) 40 MG tablet TAKE 1 TABLET BY MOUTH TWICE DAILY Qty: 60 tablet, Refills: 5    Vitamin D, Ergocalciferol, (DRISDOL) 50000 units CAPS capsule TAKE 1 CAPSULE BY MOUTH ONCE A WEEK Qty: 12 capsule, Refills: 0       Allergies  Allergen Reactions  .  Amitriptyline Other (See Comments)    sleepy  . Bextra [Valdecoxib] Other (See Comments)    unknown  . Codeine Nausea And Vomiting  . Cymbalta [Duloxetine Hcl] Swelling and Other (See Comments)    dizzy  . Esomeprazole Magnesium Diarrhea  . Niacin-Lovastatin Er Other (See Comments)    headache  . Niaspan [Niacin Er] Other (See Comments)    Increased headache  . Penicillins Rash    Has patient had a PCN reaction causing immediate rash, facial/tongue/throat swelling, SOB or lightheadedness with hypotension: Yes Has patient had a PCN reaction causing severe rash involving mucus membranes or skin necrosis: No Has patient had a PCN reaction that required hospitalization No Has patient had a PCN reaction occurring within the last 10 years: No Tolerated Zosyn 06/2016    Follow-up Information    Redge Gainer, MD Follow up in 2 week(s).   Specialty:  Family Medicine Contact  information: [place] [address] [phone]        Jorja Loa, MD. Schedule an appointment as soon as possible for a visit in 1 week(s).   Specialty:  Urology Contact information: [place] [address] [phone]            The results of significant diagnostics from this hospitalization (including imaging, microbiology, ancillary and laboratory) are listed below for reference.    Significant Diagnostic Studies: Dg Chest 2 View  Result Date: 07/02/2016 CLINICAL DATA:  Patient complains of infection of inguinal hernia for 3 weeks. EXAM: CHEST  2 VIEW COMPARISON:  04/07/2016 FINDINGS: Postsurgical changes from CABG are stable. Cardiomediastinal silhouette is normal. Mediastinal contours appear intact. Stable elevation of the right hemidiaphragm. There is no evidence of focal airspace consolidation, pleural effusion or pneumothorax. Osseous structures are without acute abnormality. Stable posttraumatic changes in the right chest wall. Soft tissues are grossly normal. IMPRESSION: No active cardiopulmonary disease. Electronically Signed   By: Fidela Salisbury M.D.   On: 07/02/2016 21:14   Dg Abd 1 View  Result Date: 07/09/2016 CLINICAL DATA:  Small-bowel obstruction. EXAM: ABDOMEN - 1 VIEW COMPARISON:  07/08/2016.  CT 07/07/2016. FINDINGS: NG tube noted with tip projected over the upper stomach. Surgical clips upper abdomen. Dilated loops of small bowel noted suggesting small bowel obstruction. No free air. Similar findings noted on prior exam. IMPRESSION: 1. NG tube tip noted over the upper stomach . 2. Persistent small bowel dilatation consistent with persistent small-bowel obstruction. Similar findings noted on prior exams. Electronically Signed   By: Marcello Moores  Register   On: 07/09/2016 06:53   US Scrotum  Result Date: 07/15/2016 CLINICAL DATA:  Left scrotal abscess, post drainage and packing 12/ 16/17 EXAM: ULTRASOUND OF SCROTUM  TECHNIQUE: Complete ultrasound examination of the testicles, epididymis, and other scrotal structures was performed. COMPARISON:  07/03/2016 FINDINGS: Right testicle Measurements: 3.1 x 2.1 x 2.9 cm. No mass or microlithiasis visualized. Left testicle Measurements: 3.2 x 1.6 x 3 cm. No mass or microlithiasis visualized. Right epididymis:  Normal in size and appearance. Left epididymis:  Normal in size and appearance. Hydrocele:  Small left hydrocele Varicocele:  None visualized. There is improvement in left scrotal wall inflammation with less thickening and without hyperemia. Some packing material is noted at the site of previous abscess. No definite confluent residual abscess or new fluid collection. There is residual mild thickening of left scrotal wall. IMPRESSION: There is improvement in the inferior aspect of left scrotal wall with less thickening and without hyperemia. Some  packing material is noted at the site of previous abscess. No definite confluent residual abscess or new fluid collection. There is residual mild thickening of left scrotal wall. Bilateral testicle shows normal echogenicity and color Doppler flow without testicular mass. Normal bilateral epididymis. Small left hydrocele. Electronically Signed   By: Lahoma Crocker M.D.   On: 07/15/2016 16:08   US Scrotum  Result Date: 07/03/2016 CLINICAL DATA:  75 year old male with left scrotal abscess. EXAM: SCROTAL ULTRASOUND DOPPLER ULTRASOUND OF THE TESTICLES TECHNIQUE: Complete ultrasound examination of the testicles, epididymis and other scrotal structures was performed. Color and spectral Doppler ultrasound were also utilized to evaluate blood flow to the testicles. COMPARISON:  None. Ultrasound dated 06/16/2016 and CT dated 07/03/2016 FINDINGS: Right testicle Measurements: 3.1 x 2.3 x 2.5 cm. No mass or microlithiasis visualized. Left testicle Measurements: 3.0 x 2.3 x 2.9 cm. The left testicle is heterogeneous. There is a 6.5 x 4.0 x 5.0 cm  complex heterogeneous hypoechoic area in the inferior and lateral aspect of the left scrotal wall with surrounding hyperemia most compatible with and developing abscess. This corresponds to the peripherally enhancing, low attenuating area in the inferior aspect of the left scrotal wall seen on the today's CT. Right epididymis:  Normal in size and appearance. Left epididymis:  Normal in size and appearance. Hydrocele:  There is a small left hydrocele. Varicocele:  None visualized. Pulsed Doppler interrogation of both testes demonstrates normal low resistance arterial and venous waveforms bilaterally. IMPRESSION: Unremarkable testicles. Complex hypoechoic area with surrounding hyperemia in the inferior aspect of the left scrotal mole compatible with phlegmon or developing abscess. Small left hydrocele. Preserved testicular perfusion. Electronically Signed   By: Anner Crete M.D.   On: 07/03/2016 01:38   US Scrotum  Result Date: 06/16/2016 CLINICAL DATA:  75 year old male with left testicular pain and swelling EXAM: SCROTAL ULTRASOUND DOPPLER ULTRASOUND OF THE TESTICLES TECHNIQUE: Complete ultrasound examination of the testicles, epididymis, and other scrotal structures was performed. Color and spectral Doppler ultrasound were also utilized to evaluate blood flow to the testicles. COMPARISON:  CT abdomen and pelvis 04/07/2016 FINDINGS: Right testicle Measurements: 4.0 x 1.9 x 2.7 cm. No mass or microlithiasis visualized. Left testicle Measurements: 3.7 x 2.2 x 3.2 cm. No mass or microlithiasis visualized. Right epididymis:  Not well seen. Left epididymis:  Not well seen. Hydrocele:  Small simple left hydrocele. Varicocele:  None visualized. Pulsed Doppler interrogation of both testes demonstrates normal low resistance arterial and venous waveforms bilaterally. Large amorphous highly complex fluid collection just superior to the left testicle. This does not appear to communicate with the scrotum directly.  IMPRESSION: 1. No evidence of testicular torsion at this time. 2. Large complex fluid collection with internal debris adjacent to but separate from the left testicle and scrotum. Differential considerations include soft tissue abscess, and complex ascites within a left inguinal hernia. Recommend dedicated CT scan of the pelvis with intravenous contrast for further evaluation. 3. Small left simple hydrocele is likely reactive and related to the process described above. 4. The epididymi are not well seen secondary to diffuse scrotal swelling. Electronically Signed   By: Jacqulynn Cadet M.D.   On: 06/16/2016 15:24   Ct Abdomen Pelvis W Contrast  Result Date: 07/07/2016 CLINICAL DATA:  Right abdominal pain, nausea/vomiting EXAM: CT ABDOMEN AND PELVIS WITH CONTRAST TECHNIQUE: Multidetector CT imaging of the abdomen and pelvis was performed using the standard protocol following bolus administration of intravenous contrast. CONTRAST:  173mL ISOVUE-300 IOPAMIDOL (ISOVUE-300) INJECTION 61% COMPARISON:  07/03/2016 FINDINGS: Lower chest:  Linear scarring/atelectasis in the right lung base. Postsurgical changes related to prior CABG. Hepatobiliary: Stable linear radiodensities along the right liver (series 2/ image 19). Additional scattered calcified granulomata. Status post cholecystectomy. No intrahepatic or extrahepatic ductal dilatation. Pancreas: Within normal limits. Spleen: Surgically absent. Adrenals/Urinary Tract: Adrenal glands are within normal limits. 4.5 cm cyst along the left upper kidney (series 2/ image 36). Right kidney is within normal limits. No hydronephrosis. Nondependent gas within the bladder (series 2/ image 81), correlate for recent instrumentation. Stomach/Bowel: Stomach is moderately distended. Multiple dilated loops of small bowel in the central abdomen, suggesting small bowel obstruction. Focal transition along the right anterior mid abdomen (series 2/image 34; coronal image 28). Colon is  relatively decompressed. Vascular/Lymphatic: No evidence of abdominal aortic aneurysm. Atherosclerotic calcifications of the abdominal aorta and branch vessels. No suspicious abdominopelvic lymphadenopathy. Reproductive: Prostate is unremarkable. Other: No abdominopelvic ascites. Fat within the bilateral inguinal canals. Musculoskeletal: Mild degenerative changes the lumbar spine. Old/healed right posterior rib fractures. Median sternotomy. IMPRESSION: Multiple loops of small bowel in the central abdomen, with focal transition point along the right anterior mid abdomen, suggesting small bowel obstruction. These results were called by telephone at the time of interpretation on 07/07/2016 at 4:30 pm to Dr. Owens Loffler , who verbally acknowledged these results. Electronically Signed   By: Julian Hy M.D.   On: 07/07/2016 16:36   Ct Abdomen Pelvis W Contrast  Result Date: 07/03/2016 CLINICAL DATA:  Ruptured hernia 5 days ago, persistent drainage. History of colon cancer, splenectomy, cholecystectomy, liver surgery. EXAM: CT ABDOMEN AND PELVIS WITH CONTRAST TECHNIQUE: Multidetector CT imaging of the abdomen and pelvis was performed using the standard protocol following bolus administration of intravenous contrast. CONTRAST:  163mL ISOVUE-300 IOPAMIDOL (ISOVUE-300) INJECTION 61% COMPARISON:  Scrotal ultrasound July 03, 2016 at 0112 hours and CT abdomen and pelvis April 15, 2016 FINDINGS: LOWER CHEST: RIGHT lung base atelectasis. RIGHT calcified pleural plaques. Old RIGHT posterior rib fractures. Status post median sternotomy. Included heart size is normal. Severe coronary artery calcifications versus stents. No pericardial effusion. HEPATOBILIARY: Postsurgical change of liver, with coarse calcifications RIGHT lobe. status post cholecystectomy. Liver dome is not fully imaged. Mild intrahepatic biliary dilatation is likely postprocedural. PANCREAS: Normal. SPLEEN: Surgically absent. ADRENALS/URINARY  TRACT: Kidneys are orthotopic, demonstrating symmetric enhancement. No nephrolithiasis, hydronephrosis or solid renal masses. 4.8 cm cyst upper pole LEFT kidney. The unopacified ureters are normal in course and caliber. Delayed imaging through the kidneys demonstrates symmetric prompt contrast excretion within the proximal urinary collecting system. Urinary bladder is partially distended containing nondependent gas. RIGHT adrenalectomy. STOMACH/BOWEL: Focally thickened gastric cardia. Small and large bowel are normal in caliber without inflammatory changes. Moderate amount of retained large bowel stool. Redundant LEFT colon. Mild colonic diverticulosis. VASCULAR/LYMPHATIC: Aortoiliac vessels are normal in course and caliber, moderate calcific atherosclerosis. No lymphadenopathy by CT size criteria. REPRODUCTIVE: Crescentic 3.3 x 1.3 cm rim enhancing fluid collection within LEFT scrotum, inferior to the testicle. No subcutaneous gas. Small LEFT hydrocele. OTHER: No intraperitoneal free fluid or free air. MUSCULOSKELETAL: Nonacute. Atrophic RIGHT anterior abdominal wall muscles with ligamentous laxity. Moderate LEFT and small RIGHT fat containing inguinal hernias. Symmetrically sclerotic humeral heads without collapse. Findings may be due to generalized osteopenia and preserved mineralization. Mild Old L2 compression fracture. IMPRESSION: 3.3 x 1.3 cm LEFT scrotal extratesticular abscess with effusion. No subcutaneous gas. Moderate LEFT and small RIGHT fat containing inguinal hernias. Air within the urinary bladder, recommend correlation with recent instrumentation. Focally  thickened gastric cardia, recommend endoscopy. Electronically Signed   By: Elon Alas M.D.   On: 07/03/2016 01:55   [place] Art/ven Flow Abd Pelv Doppler  Result Date: 07/03/2016 CLINICAL DATA:  75 year old male with left scrotal abscess. EXAM: SCROTAL ULTRASOUND DOPPLER ULTRASOUND OF THE TESTICLES TECHNIQUE: Complete ultrasound examination  of the testicles, epididymis and other scrotal structures was performed. Color and spectral Doppler ultrasound were also utilized to evaluate blood flow to the testicles. COMPARISON:  None. Ultrasound dated 06/16/2016 and CT dated 07/03/2016 FINDINGS: Right testicle Measurements: 3.1 x 2.3 x 2.5 cm. No mass or microlithiasis visualized. Left testicle Measurements: 3.0 x 2.3 x 2.9 cm. The left testicle is heterogeneous. There is a 6.5 x 4.0 x 5.0 cm complex heterogeneous hypoechoic area in the inferior and lateral aspect of the left scrotal wall with surrounding hyperemia most compatible with and developing abscess. This corresponds to the peripherally enhancing, low attenuating area in the inferior aspect of the left scrotal wall seen on the today's CT. Right epididymis:  Normal in size and appearance. Left epididymis:  Normal in size and appearance. Hydrocele:  There is a small left hydrocele. Varicocele:  None visualized. Pulsed Doppler interrogation of both testes demonstrates normal low resistance arterial and venous waveforms bilaterally. IMPRESSION: Unremarkable testicles. Complex hypoechoic area with surrounding hyperemia in the inferior aspect of the left scrotal mole compatible with phlegmon or developing abscess. Small left hydrocele. Preserved testicular perfusion. Electronically Signed   By: Anner Crete M.D.   On: 07/03/2016 01:38   [place] Art/ven Flow Abd Pelv Doppler  Result Date: 06/16/2016 CLINICAL DATA:  75 year old male with left testicular pain and swelling EXAM: SCROTAL ULTRASOUND DOPPLER ULTRASOUND OF THE TESTICLES TECHNIQUE: Complete ultrasound examination of the testicles, epididymis, and other scrotal structures was performed. Color and spectral Doppler ultrasound were also utilized to evaluate blood flow to the testicles. COMPARISON:  CT abdomen and pelvis 04/07/2016 FINDINGS: Right testicle Measurements: 4.0 x 1.9 x 2.7 cm. No mass or microlithiasis visualized. Left testicle  Measurements: 3.7 x 2.2 x 3.2 cm. No mass or microlithiasis visualized. Right epididymis:  Not well seen. Left epididymis:  Not well seen. Hydrocele:  Small simple left hydrocele. Varicocele:  None visualized. Pulsed Doppler interrogation of both testes demonstrates normal low resistance arterial and venous waveforms bilaterally. Large amorphous highly complex fluid collection just superior to the left testicle. This does not appear to communicate with the scrotum directly. IMPRESSION: 1. No evidence of testicular torsion at this time. 2. Large complex fluid collection with internal debris adjacent to but separate from the left testicle and scrotum. Differential considerations include soft tissue abscess, and complex ascites within a left inguinal hernia. Recommend dedicated CT scan of the pelvis with intravenous contrast for further evaluation. 3. Small left simple hydrocele is likely reactive and related to the process described above. 4. The epididymi are not well seen secondary to diffuse scrotal swelling. Electronically Signed   By: Jacqulynn Cadet M.D.   On: 06/16/2016 15:24   Dg Abd 2 Views  Result Date: 07/11/2016 CLINICAL DATA:  Small bowel obstruction EXAM: ABDOMEN - 2 VIEW COMPARISON:  07/10/2016, 07/09/2016 FINDINGS: Several loops of dilated small bowel again noted without significant interval change. There is air and contrast within the colon. Air in the rectum noted. Upright exam demonstrates scattered air-fluid levels in the small bowel and colon. Appearance compatible with residual persistent partial small bowel obstruction. No free air evident. IMPRESSION: Persistent small bowel dilatation and air-fluid levels but air and contrast  within the colon compatible with persistent partial obstruction pattern. Electronically Signed   By: Jerilynn Mages.  Shick M.D.   On: 07/11/2016 10:34   Dg Abd Portable 1v  Result Date: 07/12/2016 CLINICAL DATA:  Small bowel obstruction, followup EXAM: PORTABLE ABDOMEN - 1  VIEW COMPARISON:  07/11/2016 FINDINGS: Nasogastric tube projects over stomach. Retained contrast in colon. Air-filled loops of small bowel in the RIGHT mid abdomen slightly decreased in caliber since previous study. No bowel wall thickening or free intraperitoneal air. Bones demineralized. Surgical clips RIGHT upper quadrant question cholecystectomy. IMPRESSION: Decreased small bowel distention since previous study. Electronically Signed   By: Lavonia Dana M.D.   On: 07/12/2016 09:21   Dg Abd Portable 1v  Result Date: 07/10/2016 CLINICAL DATA:  Small bowel obstruction EXAM: PORTABLE ABDOMEN - 1 VIEW COMPARISON:  07/09/2016 FINDINGS: Mildly dilated loops of small bowel in the right lower abdomen, compatible with the clinical history of small bowel obstruction. Surgical clips in the right mid/ upper abdomen. Degenerative changes of the lumbar spine. IMPRESSION: Mild without loops of small bowel in the right lower abdomen, compatible with the clinical history of small bowel obstruction. Electronically Signed   By: Julian Hy M.D.   On: 07/10/2016 08:07   Dg Abd Portable 1v  Result Date: 07/08/2016 CLINICAL DATA:  Enteric tube placement EXAM: PORTABLE ABDOMEN - 1 VIEW COMPARISON:  07/08/2016 abdominal radiograph FINDINGS: Enteric tube terminates in the proximal stomach with the side port near the esophagogastric junction. CABG clips overlie the mediastinum. Visualized median sternotomy wires are stable in configuration. Surgical clips are present in the right upper quadrant of the abdomen. No disproportionately dilated small bowel loops. No evidence of pneumatosis or pneumoperitoneum. Clear lung bases. IMPRESSION: Enteric tube terminates in the proximal stomach. Electronically Signed   By: Ilona Sorrel M.D.   On: 07/08/2016 17:57   Dg Abd Portable 1v-small Bowel Obstruction Protocol-initial, 8 Hr Delay  Result Date: 07/08/2016 CLINICAL DATA:  Small bowel obstruction. Gastrografin given 8 fall hours  ago. EXAM: PORTABLE ABDOMEN - 1 VIEW COMPARISON:  One view abdomen 07/07/2016. FINDINGS: Gastrografin can be seen throughout the small bowel and into the colon. Contrast within the pelvis may be within dilated loops of small bowel or rectum. A lateral view of the pelvis would be useful for further evaluation. No free air is present. The side port of the NG tube is in the stomach. IMPRESSION: 1. Dilute Gastrografin throughout small bowel and into the colon suggesting resolving small bowel obstruction. 2. Contrast within the pelvis is likely in the distal colon and rectum. This may be with an dilated loops of small bowel. A lateral view of the pelvis would be useful further evaluation. Electronically Signed   By: San Morelle M.D.   On: 07/08/2016 07:54   Dg Abd Portable 1v-small Bowel Protocol-position Verification  Result Date: 07/07/2016 CLINICAL DATA:  NG tube placement EXAM: PORTABLE ABDOMEN - 1 VIEW COMPARISON:  CT abdomen pelvis 07/07/2016 FINDINGS: Nasogastric tube tip and side port overlie the gastric body. Mildly dilated small bowel in the central abdomen seen, consistent with findings of the earlier CT scan. IMPRESSION: NG tube tip and side port overlying the gastric body. Electronically Signed   By: Ulyses Jarred M.D.   On: 07/07/2016 21:30    Microbiology: Recent Results (from the past 240 hour(s))  C difficile quick scan w PCR reflex     Status: None   Collection Time: 07/13/16  7:33 PM  Result Value Ref Range Status  C Diff antigen NEGATIVE NEGATIVE Final   C Diff toxin NEGATIVE NEGATIVE Final   C Diff interpretation No C. difficile detected.  Final     Labs: Basic Metabolic Panel:  Recent Labs Lab 07/11/16 0610 07/12/16 0500 07/13/16 0457 07/14/16 0423 07/15/16 0435  NA 136 136 133* 134* 138  K 3.4* 4.1 4.0 3.5 3.8  CL 103 107 106 106 109  CO2 25 23 22 22 22   GLUCOSE 130* 128* 132* 119* 114*  BUN 34* 20 16 16  22*  CREATININE 0.80 0.79 0.68 0.69 0.68  CALCIUM  8.4* 8.6* 8.6* 8.4* 8.4*  MG 1.5* 1.8 1.8 1.8 1.4*  PHOS 1.4* 1.9* 2.4* 2.6 2.4*   Liver Function Tests:  Recent Labs Lab 07/11/16 0610 07/12/16 0500  AST 26 27  ALT 10* 10*  ALKPHOS 41 44  BILITOT 0.5 0.5  PROT 5.6* 5.6*  ALBUMIN 2.8* 2.8*   No results for input(s): LIPASE, AMYLASE in the last 168 hours. No results for input(s): AMMONIA in the last 168 hours. CBC:  Recent Labs Lab 07/11/16 0610 07/12/16 0500 07/13/16 0457 07/14/16 0423 07/15/16 0435  WBC 17.3* 19.0* 18.5* 19.6* 21.9*  NEUTROABS 10.5* 11.6*  --   --   --   HGB 12.8* 12.2* 12.2* 12.4* 12.4*  HCT 36.5* 35.1* 35.8* 35.8* 35.5*  MCV 85.1 86.0 86.1 85.9 85.7  PLT 388 353 350 380 369   Cardiac Enzymes: No results for input(s): CKTOTAL, CKMB, CKMBINDEX, TROPONINI in the last 168 hours. BNP: BNP (last 3 results) No results for input(s): BNP in the last 8760 hours.  ProBNP (last 3 results) No results for input(s): PROBNP in the last 8760 hours.  CBG:  Recent Labs Lab 07/14/16 1231 07/14/16 1652 07/14/16 1949 07/15/16 0742 07/15/16 1146  GLUCAP 119* 133* 150* 102* 111*       Signed:  Eleonore Chiquito S MD.  [facility] 07/15/2016, 4:34 PM
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
## 2016-07-15 NOTE — Progress Notes (Signed)
Key Points: Use following P&T approved IV to PO non-antibiotic change policy.  Description contains the criteria that are approved Note: Policy Excludes:  Esophagectomy patientsPHARMACIST - PHYSICIAN COMMUNICATION DR:   Darrick Meigs CONCERNING: IV to Oral Route Change Policy  RECOMMENDATION: This patient is receiving protonix by the intravenous route.  Based on criteria approved by the Pharmacy and Therapeutics Committee, the intravenous medication(s) is/are being converted to the equivalent oral dose form(s).   DESCRIPTION: These criteria include:  The patient is eating (either orally or via tube) and/or has been taking other orally administered medications for a least 24 hours  The patient has no evidence of active gastrointestinal bleeding or impaired GI absorption (gastrectomy, short bowel, patient on TNA or NPO).  If you have questions about this conversion, please contact the Pharmacy Department  []   [phone] )  [facility] []   [phone] )  [facility]  []   [phone] )  [facility] [x]   [phone] )  [place], [place], [facility] 07/15/2016 9:29 AM

## 2016-07-16 DIAGNOSIS — E1151 Type 2 diabetes mellitus with diabetic peripheral angiopathy without gangrene: Secondary | ICD-10-CM | POA: Diagnosis not present

## 2016-07-16 DIAGNOSIS — B958 Unspecified staphylococcus as the cause of diseases classified elsewhere: Secondary | ICD-10-CM | POA: Diagnosis not present

## 2016-07-16 DIAGNOSIS — I10 Essential (primary) hypertension: Secondary | ICD-10-CM | POA: Diagnosis not present

## 2016-07-16 DIAGNOSIS — M542 Cervicalgia: Secondary | ICD-10-CM | POA: Diagnosis not present

## 2016-07-16 DIAGNOSIS — N390 Urinary tract infection, site not specified: Secondary | ICD-10-CM | POA: Diagnosis not present

## 2016-07-16 DIAGNOSIS — I2510 Atherosclerotic heart disease of native coronary artery without angina pectoris: Secondary | ICD-10-CM | POA: Diagnosis not present

## 2016-07-16 DIAGNOSIS — N492 Inflammatory disorders of scrotum: Secondary | ICD-10-CM | POA: Diagnosis not present

## 2016-07-16 DIAGNOSIS — K5730 Diverticulosis of large intestine without perforation or abscess without bleeding: Secondary | ICD-10-CM | POA: Diagnosis not present

## 2016-07-16 DIAGNOSIS — B961 Klebsiella pneumoniae [K. pneumoniae] as the cause of diseases classified elsewhere: Secondary | ICD-10-CM | POA: Diagnosis not present

## 2016-07-16 DIAGNOSIS — G8929 Other chronic pain: Secondary | ICD-10-CM | POA: Diagnosis not present

## 2016-07-17 ENCOUNTER — Emergency Department (HOSPITAL_COMMUNITY)
Admission: EM | Admit: 2016-07-17 | Discharge: 2016-07-17 | Disposition: A | Discharge status: Home / Self Care | Payer: Medicare Other | Attending: Emergency Medicine | Admitting: Emergency Medicine

## 2016-07-17 ENCOUNTER — Encounter (HOSPITAL_COMMUNITY): Payer: Self-pay | Admitting: Emergency Medicine

## 2016-07-17 ENCOUNTER — Emergency Department (HOSPITAL_COMMUNITY): Payer: Medicare Other

## 2016-07-17 DIAGNOSIS — R4789 Other speech disturbances: Secondary | ICD-10-CM | POA: Insufficient documentation

## 2016-07-17 DIAGNOSIS — I2510 Atherosclerotic heart disease of native coronary artery without angina pectoris: Secondary | ICD-10-CM | POA: Diagnosis not present

## 2016-07-17 DIAGNOSIS — R5383 Other fatigue: Secondary | ICD-10-CM | POA: Diagnosis present

## 2016-07-17 DIAGNOSIS — E119 Type 2 diabetes mellitus without complications: Secondary | ICD-10-CM | POA: Insufficient documentation

## 2016-07-17 DIAGNOSIS — Z951 Presence of aortocoronary bypass graft: Secondary | ICD-10-CM | POA: Insufficient documentation

## 2016-07-17 DIAGNOSIS — I252 Old myocardial infarction: Secondary | ICD-10-CM | POA: Diagnosis not present

## 2016-07-17 DIAGNOSIS — Z7982 Long term (current) use of aspirin: Secondary | ICD-10-CM | POA: Insufficient documentation

## 2016-07-17 DIAGNOSIS — I10 Essential (primary) hypertension: Secondary | ICD-10-CM | POA: Insufficient documentation

## 2016-07-17 DIAGNOSIS — F17210 Nicotine dependence, cigarettes, uncomplicated: Secondary | ICD-10-CM | POA: Insufficient documentation

## 2016-07-17 LAB — CBC
HCT: 35.6 % — ABNORMAL LOW (ref 39.0–52.0)
Hemoglobin: 12.1 g/dL — ABNORMAL LOW (ref 13.0–17.0)
MCH: 29.5 pg (ref 26.0–34.0)
MCHC: 34 g/dL (ref 30.0–36.0)
MCV: 86.8 fL (ref 78.0–100.0)
PLATELETS: 386 10*3/uL (ref 150–400)
RBC: 4.1 MIL/uL — ABNORMAL LOW (ref 4.22–5.81)
RDW: 14.7 % (ref 11.5–15.5)
WBC: 18.4 10*3/uL — AB (ref 4.0–10.5)

## 2016-07-17 LAB — URINALYSIS, ROUTINE W REFLEX MICROSCOPIC
Bacteria, UA: NONE SEEN
Bilirubin Urine: NEGATIVE
Glucose, UA: NEGATIVE mg/dL
Ketones, ur: NEGATIVE mg/dL
LEUKOCYTES UA: NEGATIVE
NITRITE: NEGATIVE
Protein, ur: NEGATIVE mg/dL
SPECIFIC GRAVITY, URINE: 1.02 (ref 1.005–1.030)
pH: 5 (ref 5.0–8.0)

## 2016-07-17 LAB — BASIC METABOLIC PANEL
Anion gap: 7 (ref 5–15)
BUN: 34 mg/dL — AB (ref 6–20)
CO2: 24 mmol/L (ref 22–32)
Calcium: 8.6 mg/dL — ABNORMAL LOW (ref 8.9–10.3)
Chloride: 102 mmol/L (ref 101–111)
Creatinine, Ser: 0.99 mg/dL (ref 0.61–1.24)
GFR calc Af Amer: >60 mL/min (ref >60)
Glucose, Bld: 73 mg/dL (ref 65–99)
Potassium: 3.7 mmol/L (ref 3.5–5.1)
SODIUM: 133 mmol/L — AB (ref 135–145)

## 2016-07-17 LAB — CBG MONITORING, ED: GLUCOSE-CAPILLARY: 70 mg/dL (ref 65–99)
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
## 2016-07-17 NOTE — Discharge Instructions (Signed)
Continue current meds.   See neurologist or your doctor for mini stroke workup   Return to ER if you have worse trouble speaking, weakness, numbness, fever, vomiting, worse scrotal swelling

## 2016-07-27 DIAGNOSIS — N499 Inflammatory disorder of unspecified male genital organ: Secondary | ICD-10-CM | POA: Diagnosis not present

## 2016-08-03 ENCOUNTER — Ambulatory Visit (INDEPENDENT_AMBULATORY_CARE_PROVIDER_SITE_OTHER): Payer: Medicare Other

## 2016-08-03 ENCOUNTER — Other Ambulatory Visit: Payer: Self-pay | Admitting: Family Medicine

## 2016-08-03 ENCOUNTER — Ambulatory Visit (INDEPENDENT_AMBULATORY_CARE_PROVIDER_SITE_OTHER): Payer: Medicare Other | Admitting: Family Medicine

## 2016-08-03 ENCOUNTER — Encounter: Payer: Self-pay | Admitting: Family Medicine

## 2016-08-03 VITALS — BP 114/52 | HR 59 | Temp 97.3°F | Ht 71.0 in | Wt 188.0 lb

## 2016-08-03 DIAGNOSIS — G459 Transient cerebral ischemic attack, unspecified: Secondary | ICD-10-CM

## 2016-08-03 DIAGNOSIS — M25551 Pain in right hip: Secondary | ICD-10-CM

## 2016-08-03 DIAGNOSIS — I70209 Unspecified atherosclerosis of native arteries of extremities, unspecified extremity: Secondary | ICD-10-CM

## 2016-08-03 DIAGNOSIS — E1140 Type 2 diabetes mellitus with diabetic neuropathy, unspecified: Secondary | ICD-10-CM

## 2016-08-03 DIAGNOSIS — R200 Anesthesia of skin: Secondary | ICD-10-CM

## 2016-08-03 DIAGNOSIS — I739 Peripheral vascular disease, unspecified: Secondary | ICD-10-CM | POA: Diagnosis not present

## 2016-08-03 DIAGNOSIS — C439 Malignant melanoma of skin, unspecified: Secondary | ICD-10-CM | POA: Diagnosis not present

## 2016-08-03 DIAGNOSIS — N492 Inflammatory disorders of scrotum: Secondary | ICD-10-CM

## 2016-08-03 DIAGNOSIS — I10 Essential (primary) hypertension: Secondary | ICD-10-CM

## 2016-08-03 DIAGNOSIS — I2510 Atherosclerotic heart disease of native coronary artery without angina pectoris: Secondary | ICD-10-CM

## 2016-08-03 DIAGNOSIS — E1151 Type 2 diabetes mellitus with diabetic peripheral angiopathy without gangrene: Secondary | ICD-10-CM

## 2016-08-03 DIAGNOSIS — I4891 Unspecified atrial fibrillation: Secondary | ICD-10-CM

## 2016-08-03 MED ORDER — LACTULOSE 10 GM/15ML PO SOLN: 10.0000 g | Freq: Every day | ORAL | 5 refills | Status: DC | PRN

## 2016-08-03 MED ORDER — APIXABAN 5 MG PO TABS: 5.0000 mg | ORAL_TABLET | Freq: Two times a day (BID) | ORAL | 1 refills | Status: DC

## 2016-08-03 NOTE — Addendum Note (Signed)
Addended by: Zannie Cove on: 08/03/2016 03:54 PM   Modules accepted: Orders

## 2016-08-03 NOTE — Patient Instructions (Signed)
We will arrange for the patient to have a follow-up visit with cardiology and neurology because of this episode with a TIA. We will start him on Eliquis today which may be discontinued by the cardiologist. The cardiologist may need to get carotid Dopplers and echocardiogram and repeat EKG. The patient understands not to take any aspirin or NSAIDs like ibuprofen or Aleve with the Eliquis. He should follow-up with urology as needed We will review the x-rays of his back and hip and make further suggestions following the return of this report.

## 2016-08-03 NOTE — Progress Notes (Addendum)
Subjective:    Patient ID: Dustin Dominguez, male    DOB: 1941-06-07, 76 y.o.   MRN: [id number]  HPI Patient here today for hospital follow up from [facility] on 07/17/16 where he was diagnosed with a mini stroke. The patient was in the hospital because of a scrotal abscess and while being thereafter to 3 days he developed an intestinal blockage which resulted in him staying there for almost 3 weeks total time. He is doing better today with his bowels moving other than he does have some constipation and does take a stool softener for this. He denies any chest pain or shortness of breath anymore than usual. He's passing his water without problems. He has returned to see the urologist 1 and they do not plan to see him back again regarding the scrotal abscess. Shortly after the extended hospitalization within a few days he was back with slurred speech. The EKG showed atrial fibrillation and this has been confirmed by looking at that EKG but he was not started on any low thinners of any kind. He has not had any further episodes of slurred speech. He is referred back to [place] and to the neurologist for a workup for TIAs. The MRI of the brain did not show any acute or subacute infarction. There is chronic small vessel ischemic changes no other acute findings.   Patient Active Problem List   Diagnosis Date Noted  . Protein-calorie malnutrition, severe 07/09/2016  . SBO (small bowel obstruction)   . Malnutrition of moderate degree 07/05/2016  . Abdominal pain 07/04/2016  . Scrotal abscess 07/03/2016  . Cellulitis of scrotum 07/03/2016  . Coronary artery disease involving native coronary artery of native heart with angina pectoris ([place])   . Leukocytosis 07/06/2015  . GERD (gastroesophageal reflux disease) 06/13/2013  . Vitamin D deficiency 06/13/2013  . Degenerative disc disease, cervical 06/13/2013  . Degenerative disc disease, lumbar 06/13/2013  . Multiple thyroid nodules 04/07/2012  . Mixed  hyperlipidemia 01/27/2011  . Coronary atherosclerosis of native coronary artery   . Sleep apnea   . Tobacco use disorder   . Essential hypertension, benign   . 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 amLODipine (NORVASC) 10 MG tablet TAKE 1 TABLET BY MOUTH DAILY (Patient taking differently: TAKE 10MG  BY MOUTH DAILY)  . aspirin 81 MG EC tablet Take 162 mg by mouth daily.   Marland Kitchen atorvastatin (LIPITOR) 20 MG tablet TAKE 1 TABLET BY MOUTH EVERY DAY (Patient taking differently: TAKE 20MG  BY MOUTH EVERY DAY)  . diazepam (VALIUM) 10 MG tablet Take 1 tablet (10 mg total) by mouth 2 (two) times daily as needed.  . fenofibrate 160 MG tablet TAKE 1 TABLET BY MOUTH DAILY  . fish oil-omega-3 fatty acids 1000 MG capsule Take 1 g by mouth daily.   Marland Kitchen glimepiride (AMARYL) 2 MG tablet TAKE 1 TABLET BY MOUTH EVERY DAY  . hydrochlorothiazide (MICROZIDE) 12.5 MG capsule TAKE 1 CAPSULE BY MOUTH EVERY DAY  . isosorbide mononitrate (IMDUR) 30 MG 24 hr tablet TAKE 1 TABLET BY MOUTH DAILY  . metoprolol (LOPRESSOR) 50 MG tablet TAKE 1/2 TABLET BY MOUTH TWICE DAILY (Patient taking differently: TAKE 1/2 TABLET BY MOUTH TWICE DAILY. 12.5MG  IN THE MORNING AND 12.5MG  IN THE EVENING)  . morphine (MS CONTIN) 30 MG 12 hr tablet Take 30 mg by mouth 3 (three) times daily.  Marland Kitchen oxyCODONE-acetaminophen (PERCOCET) 10-325  MG per tablet Take 1 tablet by mouth 6 (six) times daily.   . pantoprazole (PROTONIX) 40 MG tablet TAKE 1 TABLET BY MOUTH TWICE DAILY (Patient taking differently: TAKE 40mg   BY MOUTH TWICE DAILY)  . quinapril (ACCUPRIL) 20 MG tablet TAKE 1 TABLET BY MOUTH EVERY DAY (Patient taking differently: TAKE 20MG  BY MOUTH EVERY DAY)  . senna-docusate (SENOKOT S) 8.6-50 MG tablet Take 1 tablet by mouth as needed for mild constipation.  . sucralfate (CARAFATE) 1 g tablet Take 1 tablet (1 g total) by mouth 4 (four) times  daily -  with meals and at bedtime.  . Vitamin D, Cholecalciferol, 1000 UNITS CAPS Take 1 capsule by mouth daily. (Patient taking differently: Take 1,000 Units by mouth daily. )  . Vitamin D, Ergocalciferol, (DRISDOL) 50000 units CAPS capsule TAKE 1 CAPSULE BY MOUTH ONCE A WEEK  . [DISCONTINUED] cephALEXin (KEFLEX) 500 MG capsule Take 1 capsule (500 mg total) by mouth 2 (two) times daily.  . [DISCONTINUED] doxycycline (VIBRA-TABS) 100 MG tablet Take 1 tablet (100 mg total) by mouth every 12 (twelve) hours.   No facility-administered encounter medications on file as of 08/03/2016.       Review of Systems  Constitutional: Negative.   HENT: Negative.   Eyes: Negative.   Respiratory: Negative.   Cardiovascular: Negative.   Gastrointestinal: Negative.   Endocrine: Negative.   Genitourinary: Negative.   Musculoskeletal: Positive for arthralgias (right side hip numbness).  Skin: Negative.   Allergic/Immunologic: Negative.   Neurological: Negative.   Hematological: Negative.   Psychiatric/Behavioral: Negative.        Objective:   Physical Exam  Constitutional: He is oriented to person, place, and time. He appears well-developed and well-nourished. No distress.  HENT:  Head: Normocephalic and atraumatic.  Right Ear: External ear normal.  Left Ear: External ear normal.  Nose: Nose normal.  Mouth/Throat: Oropharynx is clear and moist. No oropharyngeal exudate.  Eyes: Conjunctivae and EOM are normal. Pupils are equal, round, and reactive to light. Right eye exhibits no discharge. Left eye exhibits no discharge. No scleral icterus.  Neck: Normal range of motion. Neck supple. No thyromegaly present.  Right supraclavicular bruit  Cardiovascular: Normal rate, normal heart sounds and intact distal pulses.  Exam reveals no gallop and no friction rub.   No murmur heard. The heart is irregular at 60/m  Pulmonary/Chest: Effort normal and breath sounds normal. No respiratory distress. He has no  wheezes. He has no rales. He exhibits no tenderness.  Abdominal: Soft. Bowel sounds are normal. He exhibits no mass. There is no tenderness. There is no rebound and no guarding.  Abdomen nontender without masses or bruits or organ enlargement  Genitourinary:  Genitourinary Comments: The scrotal abscess has packing in it and there is no redness or apparent drainage. The patient changes the dressing daily.  Musculoskeletal: Normal range of motion. He exhibits no edema.  Leg raising was good bilaterally  Lymphadenopathy:    He has no cervical adenopathy.  Neurological: He is alert and oriented to person, place, and time. No cranial nerve deficit.  Skin: Skin is warm and dry. No rash noted.  Psychiatric: He has a normal mood and affect. His behavior is normal. Judgment and thought content normal.  Nursing note and vitals reviewed.   BP (!) 114/52 (BP Location: Left Arm)   Pulse (!) 59   Temp 97.3 F (36.3 C) (Oral)   Ht 5\' 11"  (1.803 m)   Wt 188 lb (85.3 kg)   BMI  26.22 kg/m   X-ray of spine and right hip with results pending EKG with results pending-----atrial fibrillation still present.     Assessment & Plan:  1. ASCVD (arteriosclerotic cardiovascular disease) -The patient was recently admitted to the hospital with slurred speech. An EKG done at that time did show atrial fibrillation. He did have an MRI of the brain which did not show any acute findings other than some mild to moderate ischemic changes. He is not on any kind of blood thinner. We will refer to cardiology for further evaluation with possible need for echocardiogram and carotid Dopplers. - Ambulatory referral to Cardiology  2. Peripheral vascular insufficiency (HCC) -Pedal pulses were present today.  3. Type 2 diabetes mellitus with diabetic neuropathy, without long-term current use of insulin ([place]) -The patient's blood sugars have been good at home. - Ambulatory referral to Cardiology  4. Melanoma of skin  ([place]) -He continues to be followed by dermatology.  5. Essential hypertension, benign -Blood pressure is good today and he will continue with current treatment - Ambulatory referral to Cardiology  6. 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 Transient cerebral ischemia, unspecified type -This was apparent with the slurred speech. He was discharged for further evaluation of TIA. We will start him on Eliquis until he sees a cardiologist for further evaluation. - Ambulatory referral to Cardiology - Ambulatory referral to Neurology - apixaban (ELIQUIS) 5 MG TABS tablet; Take 1 tablet (5 mg total) by mouth 2 (two) times daily.  Dispense: 60 tablet; Refill: 1  8. Atrial fibrillation, unspecified type [facility]) -This was apparent in the hospital when he was admitted with the episode of slurred speech. - Ambulatory referral to Cardiology - Ambulatory referral to Neurology - apixaban (ELIQUIS) 5 MG TABS tablet; Take 1 tablet (5 mg total) by mouth 2 (two) times daily.  Dispense: 60 tablet; Refill: 1 - EKG 12-Lead  9. Numbness -The pain in his right hip and the numbness in his legs are a new problem that had developed recently. - DG HIP UNILAT W OR W/O PELVIS 2-3 VIEWS RIGHT; Future - DG Lumbar Spine 2-3 Views; Future  10. Right hip pain - DG HIP UNILAT W OR W/O PELVIS 2-3 VIEWS RIGHT; Future - DG Lumbar Spine 2-3 Views; Future  11. Scrotal abscess -The patient packs a abscess daily. The wound appears to be healing slowly with the packing. There is no redness or apparent drainage today. He does not have any further follow-ups with urology.   Meds ordered this encounter  Medications  . apixaban (ELIQUIS) 5 MG TABS tablet    Sig: Take 1 tablet (5 mg total) by mouth 2 (two) times daily.    Dispense:  60 tablet    Refill:  1   Patient Instructions  We will arrange for the  patient to have a follow-up visit with cardiology and neurology because of this episode with a TIA. We will start him on Eliquis today which may be discontinued by the cardiologist. The cardiologist may need to get carotid Dopplers and echocardiogram and repeat EKG. The patient understands not to take any aspirin or NSAIDs like ibuprofen or Aleve with the Eliquis. He should follow-up with urology as needed We will review the x-rays of his back and hip and make further suggestions following the return of this report.  Arrie Senate MD

## 2016-08-04 ENCOUNTER — Encounter: Payer: Self-pay | Admitting: Family Medicine

## 2016-08-04 DIAGNOSIS — I700 Atherosclerosis of aorta: Secondary | ICD-10-CM | POA: Insufficient documentation

## 2016-08-09 ENCOUNTER — Other Ambulatory Visit: Payer: Self-pay | Admitting: *Deleted

## 2016-08-09 DIAGNOSIS — M8580 Other specified disorders of bone density and structure, unspecified site: Secondary | ICD-10-CM

## 2016-08-09 DIAGNOSIS — M5137 Other intervertebral disc degeneration, lumbosacral region: Secondary | ICD-10-CM

## 2016-08-10 ENCOUNTER — Ambulatory Visit: Payer: Medicare Other | Admitting: Neurology

## 2016-08-16 ENCOUNTER — Ambulatory Visit: Payer: Medicare Other | Admitting: Family Medicine

## 2016-08-16 ENCOUNTER — Telehealth: Payer: Self-pay | Admitting: Family Medicine

## 2016-08-16 NOTE — Telephone Encounter (Signed)
Pt aware appt moved 

## 2016-08-18 ENCOUNTER — Ambulatory Visit (INDEPENDENT_AMBULATORY_CARE_PROVIDER_SITE_OTHER): Payer: Medicare Other | Admitting: Family Medicine

## 2016-08-18 ENCOUNTER — Other Ambulatory Visit: Payer: Self-pay | Admitting: *Deleted

## 2016-08-18 ENCOUNTER — Ambulatory Visit (HOSPITAL_COMMUNITY)
Admission: RE | Admit: 2016-08-18 | Discharge: 2016-08-18 | Disposition: A | Discharge status: Home / Self Care | Payer: Medicare Other | Source: Ambulatory Visit | Attending: Family Medicine | Admitting: Family Medicine

## 2016-08-18 DIAGNOSIS — N492 Inflammatory disorders of scrotum: Secondary | ICD-10-CM

## 2016-08-18 DIAGNOSIS — B961 Klebsiella pneumoniae [K. pneumoniae] as the cause of diseases classified elsewhere: Secondary | ICD-10-CM

## 2016-08-18 DIAGNOSIS — M5126 Other intervertebral disc displacement, lumbar region: Secondary | ICD-10-CM | POA: Diagnosis not present

## 2016-08-18 DIAGNOSIS — M5137 Other intervertebral disc degeneration, lumbosacral region: Secondary | ICD-10-CM | POA: Insufficient documentation

## 2016-08-18 DIAGNOSIS — M48061 Spinal stenosis, lumbar region without neurogenic claudication: Secondary | ICD-10-CM | POA: Diagnosis not present

## 2016-08-18 DIAGNOSIS — B958 Unspecified staphylococcus as the cause of diseases classified elsewhere: Secondary | ICD-10-CM

## 2016-08-18 DIAGNOSIS — N390 Urinary tract infection, site not specified: Secondary | ICD-10-CM | POA: Diagnosis not present

## 2016-08-18 DIAGNOSIS — M8580 Other specified disorders of bone density and structure, unspecified site: Secondary | ICD-10-CM

## 2016-08-18 DIAGNOSIS — M5136 Other intervertebral disc degeneration, lumbar region: Secondary | ICD-10-CM

## 2016-08-19 ENCOUNTER — Ambulatory Visit (INDEPENDENT_AMBULATORY_CARE_PROVIDER_SITE_OTHER): Payer: Medicare Other | Admitting: Neurology

## 2016-08-19 ENCOUNTER — Encounter: Payer: Self-pay | Admitting: Neurology

## 2016-08-19 VITALS — BP 132/60 | HR 60 | Ht 71.0 in | Wt 191.1 lb

## 2016-08-19 DIAGNOSIS — G458 Other transient cerebral ischemic attacks and related syndromes: Secondary | ICD-10-CM | POA: Diagnosis not present

## 2016-08-19 DIAGNOSIS — E119 Type 2 diabetes mellitus without complications: Secondary | ICD-10-CM

## 2016-08-19 DIAGNOSIS — I10 Essential (primary) hypertension: Secondary | ICD-10-CM | POA: Diagnosis not present

## 2016-08-19 DIAGNOSIS — F17200 Nicotine dependence, unspecified, uncomplicated: Secondary | ICD-10-CM | POA: Diagnosis not present

## 2016-08-19 DIAGNOSIS — E785 Hyperlipidemia, unspecified: Secondary | ICD-10-CM

## 2016-08-19 DIAGNOSIS — Z794 Long term (current) use of insulin: Secondary | ICD-10-CM

## 2016-08-19 DIAGNOSIS — I4891 Unspecified atrial fibrillation: Secondary | ICD-10-CM

## 2016-08-19 NOTE — Patient Instructions (Signed)
1.  We will check carotid doppler 2.  Quit smoking 3.  Mediterranean diet  Mediterranean Diet A Mediterranean diet refers to food and lifestyle choices that are based on the traditions of countries located on the [employer]. This way of eating has been shown to help prevent certain conditions and improve outcomes for people who have chronic diseases, like kidney disease and heart disease. What are tips for following this plan? Lifestyle  Cook and eat meals together with your family, when possible.  Drink enough fluid to keep your urine clear or pale yellow.  Be physically active every day. This includes:  Aerobic exercise like running or swimming.  Leisure activities like gardening, walking, or housework.  Get 7-8 hours of sleep each night.  If recommended by your health care provider, drink red wine in moderation. This means 1 glass a day for nonpregnant women and 2 glasses a day for men. A glass of wine equals 5 oz (150 mL). Reading food labels  Check the serving size of packaged foods. For foods such as rice and pasta, the serving size refers to the amount of cooked product, not dry.  Check the total fat in packaged foods. Avoid foods that have saturated fat or trans fats.  Check the ingredients list for added sugars, such as corn syrup. Shopping  At the grocery store, buy most of your food from the areas near the walls of the store. This includes:  Fresh fruits and vegetables (produce).  Grains, beans, nuts, and seeds. Some of these may be available in unpackaged forms or large amounts (in bulk).  Fresh seafood.  Poultry and eggs.  Low-fat dairy products.  Buy whole ingredients instead of prepackaged foods.  Buy fresh fruits and vegetables in-season from local farmers markets.  Buy frozen fruits and vegetables in resealable bags.  If you do not have access to quality fresh seafood, buy precooked frozen shrimp or canned fish, such as tuna, salmon, or  sardines.  Buy small amounts of raw or cooked vegetables, salads, or olives from the deli or salad bar at your store.  Stock your pantry so you always have certain foods on hand, such as olive oil, canned tuna, canned tomatoes, rice, pasta, and beans. Cooking  Cook foods with extra-virgin olive oil instead of using butter or other vegetable oils.  Have meat as a side dish, and have vegetables or grains as your main dish. This means having meat in small portions or adding small amounts of meat to foods like pasta or stew.  Use beans or vegetables instead of meat in common dishes like chili or lasagna.  Experiment with different cooking methods. Try roasting or broiling vegetables instead of steaming or sauteing them.  Add frozen vegetables to soups, stews, pasta, or rice.  Add nuts or seeds for added healthy fat at each meal. You can add these to yogurt, salads, or vegetable dishes.  Marinate fish or vegetables using olive oil, lemon juice, garlic, and fresh herbs. Meal planning  Plan to eat 1 vegetarian meal one day each week. Try to work up to 2 vegetarian meals, if possible.  Eat seafood 2 or more times a week.  Have healthy snacks readily available, such as:  Vegetable sticks with hummus.  Greek yogurt.  Fruit and nut trail mix.  Eat balanced meals throughout the week. This includes:  Fruit: 2-3 servings a day  Vegetables: 4-5 servings a day  Low-fat dairy: 2 servings a day  Fish, poultry, or lean meat:  1 serving a day  Beans and legumes: 2 or more servings a week  Nuts and seeds: 1-2 servings a day  Whole grains: 6-8 servings a day  Extra-virgin olive oil: 3-4 servings a day  Limit red meat and sweets to only a few servings a month What are my food choices?  Mediterranean diet  Recommended  Grains: Whole-grain pasta. Brown rice. Bulgar wheat. Polenta. Couscous. Whole-wheat bread. [place].  Vegetables: Artichokes. Beets. Broccoli. Cabbage.  Carrots. Eggplant. Green beans. Chard. Kale. Spinach. Onions. Leeks. Peas. Squash. Tomatoes. Peppers. Radishes.  Fruits: Apples. Apricots. Avocado. Berries. Bananas. Cherries. Dates. Figs. Grapes. Lemons. Melon. Oranges. Peaches. Plums. Pomegranate.  Meats and other protein foods: Beans. Almonds. Sunflower seeds. Pine nuts. Peanuts. [place]. Salmon. Scallops. Shrimp. [place]. Tilapia. Clams. Oysters. Eggs.  Dairy: Low-fat milk. Cheese. Greek yogurt.  Beverages: Water. Red wine. Herbal tea.  Fats and oils: Extra virgin olive oil. Avocado oil. Grape seed oil.  Sweets and desserts: [place] yogurt with honey. Baked apples. Poached pears. Trail mix.  Seasoning and other foods: Basil. Cilantro. Coriander. Cumin. Mint. Parsley. Sage. Rosemary. Tarragon. Garlic. Oregano. Thyme. Pepper. Balsalmic vinegar. Tahini. Hummus. Tomato sauce. Olives. Mushrooms.  Limit these  Grains: Prepackaged pasta or rice dishes. Prepackaged cereal with added sugar.  Vegetables: Deep fried potatoes (french fries).  Fruits: Fruit canned in syrup.  Meats and other protein foods: Beef. Pork. Lamb. Poultry with skin. Hot dogs. Berniece Salines.  Dairy: Ice cream. Sour cream. Whole milk.  Beverages: Juice. Sugar-sweetened soft drinks. Beer. Liquor and spirits.  Fats and oils: Butter. Canola oil. Vegetable oil. Beef fat (tallow). Lard.  Sweets and desserts: Cookies. Cakes. Pies. Candy.  Seasoning and other foods: Mayonnaise. Premade sauces and marinades.  The items listed may not be a complete list. Talk with your dietitian about what dietary choices are right for you. Summary  The Mediterranean diet includes both food and lifestyle choices.  Eat a variety of fresh fruits and vegetables, beans, nuts, seeds, and whole grains.  Limit the amount of red meat and sweets that you eat.  Talk with your health care provider about whether it is safe for you to drink red wine in moderation. This means 1 glass a day for nonpregnant women  and 2 glasses a day for men. A glass of wine equals 5 oz (150 mL). This information is not intended to replace advice given to you by your health care provider. Make sure you discuss any questions you have with your health care provider. Document Released: 02/26/2016 Document Revised: 03/30/2016 Document Reviewed: 02/26/2016 Elsevier Interactive Patient Education  2017 [employer].
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
## 2016-08-23 DIAGNOSIS — L570 Actinic keratosis: Secondary | ICD-10-CM | POA: Diagnosis not present
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
## 2016-08-25 ENCOUNTER — Encounter: Payer: Self-pay | Admitting: Cardiology

## 2016-08-25 ENCOUNTER — Ambulatory Visit (INDEPENDENT_AMBULATORY_CARE_PROVIDER_SITE_OTHER): Payer: Medicare Other | Admitting: Cardiology

## 2016-08-25 VITALS — BP 128/70 | HR 68 | Ht 71.0 in | Wt 189.0 lb

## 2016-08-25 DIAGNOSIS — I484 Atypical atrial flutter: Secondary | ICD-10-CM | POA: Diagnosis not present

## 2016-08-25 DIAGNOSIS — I10 Essential (primary) hypertension: Secondary | ICD-10-CM | POA: Diagnosis not present

## 2016-08-25 DIAGNOSIS — I2510 Atherosclerotic heart disease of native coronary artery without angina pectoris: Secondary | ICD-10-CM

## 2016-08-25 DIAGNOSIS — E782 Mixed hyperlipidemia: Secondary | ICD-10-CM | POA: Diagnosis not present

## 2016-08-25 NOTE — Patient Instructions (Signed)
Your physician wants you to follow-up in: 6 months with Dr. Domenic Polite. You will receive a reminder letter in the mail two months in advance. If you don't receive a letter, please call our office to schedule the follow-up appointment.  Your physician has recommended you make the following change in your medication:  Stop Aspirin   Your physician has requested that you have an echocardiogram. Echocardiography is a painless test that uses sound waves to create images of your heart. It provides your doctor with information about the size and shape of your heart and how well your heart's chambers and valves are working. This procedure takes approximately one hour. There are no restrictions for this procedure.    Thank you for choosing [facility]!!

## 2016-08-26 ENCOUNTER — Ambulatory Visit (INDEPENDENT_AMBULATORY_CARE_PROVIDER_SITE_OTHER): Payer: Medicare Other | Admitting: Family Medicine

## 2016-08-26 ENCOUNTER — Encounter: Payer: Self-pay | Admitting: Family Medicine

## 2016-08-26 VITALS — BP 117/58 | HR 64 | Temp 97.7°F | Ht 71.0 in | Wt 192.0 lb

## 2016-08-26 DIAGNOSIS — G459 Transient cerebral ischemic attack, unspecified: Secondary | ICD-10-CM | POA: Diagnosis not present

## 2016-08-26 DIAGNOSIS — M25551 Pain in right hip: Secondary | ICD-10-CM | POA: Diagnosis not present

## 2016-08-26 DIAGNOSIS — G629 Polyneuropathy, unspecified: Secondary | ICD-10-CM

## 2016-08-26 DIAGNOSIS — L209 Atopic dermatitis, unspecified: Secondary | ICD-10-CM

## 2016-08-26 DIAGNOSIS — J029 Acute pharyngitis, unspecified: Secondary | ICD-10-CM | POA: Diagnosis not present

## 2016-08-26 LAB — RAPID STREP SCREEN (MED CTR MEBANE ONLY): Strep Gp A Ag, IA W/Reflex: NEGATIVE

## 2016-08-26 LAB — CULTURE, GROUP A STREP

## 2016-08-26 MED ORDER — CEPHALEXIN 500 MG PO CAPS: 500.0000 mg | ORAL_CAPSULE | Freq: Two times a day (BID) | ORAL | 0 refills | Status: DC

## 2016-08-26 NOTE — Patient Instructions (Addendum)
Follow-up with echocardiogram as planned by the cardiologist Follow-up with carotid Dopplers as planned by the neurologist Keep follow-up appointment with the orthopedic surgeon and take reports of MRI, right hip and LS-spine with you to that visit for further evaluation and management of the symptoms you are having. The rapid strep test that was done in the office was negative. Take antibiotic prescribed as directed until completed. Use soap and fabric softener and detergent that is scent free and dermatology approved

## 2016-08-26 NOTE — Progress Notes (Signed)
Subjective:    Patient ID: Dustin Dominguez, male    DOB: [birth year], 76 y.o.   MRN: [id number]  HPI Patient here today for follow up MRI and he also complains of a rash and sore throat. The patient today does complain of a sore throat and a slight cough. Also complains of a rash on his back. He uses safeguard soap [employer] and he is not sure of the detergent. He still has the pain in his right groin area and numbness. We reviewed the MRI reports and gave him copies of these reports as well as copies of the plain film x-rays that were done in the office of his right hip and LS spine and he will take these with him to his visit to see the orthopedic surgeon. He will wait for the neurologist office to call him with the scheduled for his carotid Dopplers. He still waiting to hear about when to get the echocardiogram. This is because of the stroke symptoms that he presented with in the past that have resolved. The patient denies any chest pain but does have a slight cough. He still has his right hip pain and groin pain. It is severe at times.    Patient Active Problem List   Diagnosis Date Noted  . Abdominal aortic atherosclerosis ([place]) 08/04/2016  . Protein-calorie malnutrition, severe 07/09/2016  . SBO (small bowel obstruction)   . Malnutrition of moderate degree 07/05/2016  . Abdominal pain 07/04/2016  . Scrotal abscess 07/03/2016  . Cellulitis of scrotum 07/03/2016  . Coronary artery disease involving native coronary artery of native heart with angina pectoris ([place])   . Leukocytosis 07/06/2015  . GERD (gastroesophageal reflux disease) 06/13/2013  . Vitamin D deficiency 06/13/2013  . Degenerative disc disease, cervical 06/13/2013  . Degenerative disc disease, lumbar 06/13/2013  . Multiple thyroid nodules 04/07/2012  . Mixed hyperlipidemia 01/27/2011  . Coronary atherosclerosis of native coronary artery   . Sleep apnea   . Tobacco use disorder   . Essential hypertension,  benign   . 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 amLODipine (NORVASC) 10 MG tablet TAKE 1 TABLET BY MOUTH DAILY (Patient taking differently: TAKE 10MG  BY MOUTH DAILY)  . apixaban (ELIQUIS) 5 MG TABS tablet Take 1 tablet (5 mg total) by mouth 2 (two) times daily.  Marland Kitchen atorvastatin (LIPITOR) 20 MG tablet TAKE 1 TABLET BY MOUTH EVERY DAY (Patient taking differently: TAKE 20MG  BY MOUTH EVERY DAY)  . diazepam (VALIUM) 10 MG tablet Take 1 tablet (10 mg total) by mouth 2 (two) times daily as needed.  . fenofibrate 160 MG tablet TAKE 1 TABLET BY MOUTH DAILY  . fish oil-omega-3 fatty acids 1000 MG capsule Take 1 g by mouth daily.   Marland Kitchen glimepiride (AMARYL) 2 MG tablet TAKE 1 TABLET BY MOUTH EVERY DAY  . hydrochlorothiazide (MICROZIDE) 12.5 MG capsule TAKE 1 CAPSULE BY MOUTH EVERY DAY  . isosorbide mononitrate (IMDUR) 30 MG 24 hr tablet TAKE 1 TABLET BY MOUTH DAILY  . lactulose (CHRONULAC) 10 GM/15ML solution Take 15 mLs (10 g total) by mouth daily as needed for mild constipation, moderate constipation or severe constipation. Reported on 11/11/2015  . metoprolol (LOPRESSOR) 50 MG tablet TAKE 1/2 TABLET BY MOUTH TWICE DAILY (Patient taking differently: TAKE 1/2 TABLET BY MOUTH TWICE DAILY. 12.5MG  IN THE MORNING AND 12.5MG  IN THE EVENING)  . morphine (MS CONTIN)  30 MG 12 hr tablet Take 30 mg by mouth 3 (three) times daily.  Marland Kitchen oxyCODONE-acetaminophen (PERCOCET) 10-325 MG per tablet Take 1 tablet by mouth 6 (six) times daily.   . pantoprazole (PROTONIX) 40 MG tablet TAKE 1 TABLET BY MOUTH TWICE DAILY (Patient taking differently: TAKE 40mg   BY MOUTH TWICE DAILY)  . quinapril (ACCUPRIL) 20 MG tablet TAKE 1 TABLET BY MOUTH EVERY DAY (Patient taking differently: TAKE 20MG  BY MOUTH EVERY DAY)  . senna-docusate (SENOKOT S) 8.6-50 MG tablet Take 1 tablet by mouth as needed for mild constipation.  . sucralfate  (CARAFATE) 1 g tablet Take 1 tablet (1 g total) by mouth 4 (four) times daily -  with meals and at bedtime.  . Vitamin D, Ergocalciferol, (DRISDOL) 50000 units CAPS capsule TAKE 1 CAPSULE BY MOUTH ONCE A WEEK   No facility-administered encounter medications on file as of 08/26/2016.       Review of Systems  Constitutional: Negative.   HENT: Positive for sore throat.   Eyes: Negative.   Respiratory: Negative.   Cardiovascular: Negative.   Gastrointestinal: Negative.   Endocrine: Negative.   Genitourinary: Negative.   Musculoskeletal: Negative.   Skin: Positive for rash (on back ).  Allergic/Immunologic: Negative.   Neurological: Negative.   Hematological: Negative.   Psychiatric/Behavioral: Negative.        Objective:   Physical Exam  Constitutional: He is oriented to person, place, and time. He appears well-developed and well-nourished. No distress.  HENT:  Head: Normocephalic and atraumatic.  Right Ear: External ear normal.  Left Ear: External ear normal.  Nose: Nose normal.  Mouth/Throat: No oropharyngeal exudate.  The throat is red and swollen posteriorly.  Eyes: Conjunctivae and EOM are normal. Pupils are equal, round, and reactive to light. Right eye exhibits no discharge. Left eye exhibits no discharge. No scleral icterus.  Neck: Normal range of motion. Neck supple. No thyromegaly present.  Is no anterior cervical adenopathy.  Cardiovascular: Normal rate, regular rhythm and normal heart sounds.   No murmur heard. The heart was regular.  Pulmonary/Chest: Effort normal and breath sounds normal. No respiratory distress. He has no wheezes. He has no rales. He exhibits no tenderness.  Clear anteriorly and posteriorly with no congestion or wheezing.  Musculoskeletal: He exhibits tenderness. He exhibits no edema.  Lymphadenopathy:    He has no cervical adenopathy.  Neurological: He is alert and oriented to person, place, and time.  Skin: Skin is warm and dry. Rash noted.  No erythema.  Patient has a rash on his back which appears like a atopic dermatitis. The skin is very dry and there are areas of excoriation.  Psychiatric: He has a normal mood and affect. His behavior is normal. Judgment and thought content normal.  Nursing note and vitals reviewed.   BP (!) 117/58 (BP Location: Left Arm)   Pulse 64   Temp 97.7 F (36.5 C) (Oral)   Ht 5\' 11"  (1.803 m)   Wt 192 lb (87.1 kg)   BMI 26.78 kg/m        Assessment & Plan:  1. Sore throat -Take antibiotic as directed for throat and until completed - Rapid strep screen (not at [facility])  2. Neuropathy [facility]) -Follow-up with orthopedic surgeon as planned because of right hip pain and numbness.  3. Right hip pain -Follow-up with orthopedics  4. Transient cerebral ischemia, unspecified type -Neuro has schedule or we will schedule patient for carotid Dopplers  5. Atopic dermatitis, unspecified type -Use soap fabric  softeners and detergents that are scent free in dermatology approved  Patient Instructions  Follow-up with echocardiogram as planned by the cardiologist Follow-up with carotid Dopplers as planned by the neurologist Keep follow-up appointment with the orthopedic surgeon and take reports of MRI, right hip and LS-spine with you to that visit for further evaluation and management of the symptoms you are having. The rapid strep test that was done in the office was negative. Take antibiotic prescribed as directed until completed. Use soap and fabric softener and detergent that is scent free and dermatology approved  Arrie Senate MD

## 2016-08-26 NOTE — Addendum Note (Signed)
Addended by: Thana Ates on: 08/26/2016 01:24 PM   Modules accepted: Orders

## 2016-09-02 ENCOUNTER — Other Ambulatory Visit: Payer: Self-pay | Admitting: Family Medicine

## 2016-09-06 ENCOUNTER — Other Ambulatory Visit: Payer: Self-pay | Admitting: Family Medicine

## 2016-09-06 DIAGNOSIS — G459 Transient cerebral ischemic attack, unspecified: Secondary | ICD-10-CM

## 2016-09-06 DIAGNOSIS — M5442 Lumbago with sciatica, left side: Secondary | ICD-10-CM | POA: Diagnosis not present

## 2016-09-06 DIAGNOSIS — G8929 Other chronic pain: Secondary | ICD-10-CM | POA: Diagnosis not present

## 2016-09-06 DIAGNOSIS — I4891 Unspecified atrial fibrillation: Secondary | ICD-10-CM

## 2016-09-06 DIAGNOSIS — M5136 Other intervertebral disc degeneration, lumbar region: Secondary | ICD-10-CM | POA: Diagnosis not present

## 2016-09-09 ENCOUNTER — Other Ambulatory Visit: Payer: Medicare Other

## 2016-10-04 ENCOUNTER — Other Ambulatory Visit: Payer: Self-pay | Admitting: Family Medicine

## 2016-10-21 ENCOUNTER — Encounter: Payer: Self-pay | Admitting: Family Medicine

## 2016-10-21 ENCOUNTER — Ambulatory Visit (INDEPENDENT_AMBULATORY_CARE_PROVIDER_SITE_OTHER): Payer: Medicare Other | Admitting: Family Medicine

## 2016-10-21 VITALS — BP 127/57 | HR 55 | Temp 98.3°F | Ht 71.0 in | Wt 194.0 lb

## 2016-10-21 DIAGNOSIS — K219 Gastro-esophageal reflux disease without esophagitis: Secondary | ICD-10-CM | POA: Diagnosis not present

## 2016-10-21 DIAGNOSIS — N492 Inflammatory disorders of scrotum: Secondary | ICD-10-CM | POA: Diagnosis not present

## 2016-10-21 DIAGNOSIS — I2510 Atherosclerotic heart disease of native coronary artery without angina pectoris: Secondary | ICD-10-CM | POA: Diagnosis not present

## 2016-10-21 DIAGNOSIS — E782 Mixed hyperlipidemia: Secondary | ICD-10-CM | POA: Diagnosis not present

## 2016-10-21 DIAGNOSIS — I10 Essential (primary) hypertension: Secondary | ICD-10-CM | POA: Diagnosis not present

## 2016-10-21 DIAGNOSIS — E559 Vitamin D deficiency, unspecified: Secondary | ICD-10-CM | POA: Diagnosis not present

## 2016-10-21 DIAGNOSIS — E1140 Type 2 diabetes mellitus with diabetic neuropathy, unspecified: Secondary | ICD-10-CM | POA: Diagnosis not present

## 2016-10-21 LAB — BAYER DCA HB A1C WAIVED: HB A1C (BAYER DCA - WAIVED): 5.4 % (ref <7.0)

## 2016-10-21 MED ORDER — DIAZEPAM 10 MG PO TABS: 10.0000 mg | ORAL_TABLET | Freq: Two times a day (BID) | ORAL | 4 refills | Status: DC | PRN

## 2016-10-21 NOTE — Patient Instructions (Addendum)
Medicare Annual Wellness Visit  [facility] and the medical providers at [facility] strive to bring you the best medical care.  In doing so we not only want to address your current medical conditions and concerns but also to detect new conditions early and prevent illness, disease and health-related problems.    Medicare offers a yearly Wellness Visit which allows our clinical staff to assess your need for preventative services including immunizations, lifestyle education, counseling to decrease risk of preventable diseases and screening for fall risk and other medical concerns.    This visit is provided free of charge (no copay) for all Medicare recipients. The clinical pharmacists at [facility] have begun to conduct these Wellness Visits which will also include a thorough review of all your medications.    As you primary medical provider recommend that you make an appointment for your Annual Wellness Visit if you have not done so already this year.  You may set up this appointment before you leave today or you may call back ([phone]) and schedule an appointment.  Please make sure when you call that you mention that you are scheduling your Annual Wellness Visit with the clinical pharmacist so that the appointment may be made for the proper length of time.     Continue current medications. Continue good therapeutic lifestyle changes which include good diet and exercise. Fall precautions discussed with patient. If an FOBT was given today- please return it to our front desk. If you are over 48 years old - you may need Prevnar 47 or the adult Pneumonia vaccine.  **Flu shots are available--- please call and schedule a FLU-CLINIC appointment**  After your visit with [place] today you will receive a survey in the mail or online from [employer] regarding your care with [place]. Please take a moment to fill this out. Your feedback is very  important to [place] as you can help [place] better understand your patient needs as well as improve your experience and satisfaction. WE CARE ABOUT YOU!!!   The patient should follow-up with the urologist if he develops more problems with his scrotal abscess. He should continue to check his blood sugars periodically at home He should always check his feet He needs to be scheduled for an eye exam and he is responsible for doing this.

## 2016-10-21 NOTE — Progress Notes (Signed)
Subjective:    Patient ID: Dustin Dominguez, male    DOB: 07/12/1941, 76 y.o.   MRN: [id number]  HPI Pt here for follow up and management of chronic medical problems which includes diabetes, hypertension and hyperlipidemia. He is taking medication regularly.This patient has quite an eventful past few months. The worst situation was with his scrotal abscess requiring hospitalization and treatment with antibiotics for this. The patient indicates that he does not have any follow-up appointment with the urologist that he is aware of. We will look into this further. He is due to return an FOBT and will get lab work today. He has no complaints today. He is requesting a refill on his Valium. He denies any chest pain or shortness of breath anymore than usual. He does have occasional loose bowel movements but then sometimes also has some constipation. Asking his water without problems although he goes frequently. He continues to have the ongoing right sided abdominal pain. He understands the importance of getting his eyes examined. He says that his blood sugars at home and been no higher than I 140 250 when he checks them.    Patient Active Problem List   Diagnosis Date Noted  . Abdominal aortic atherosclerosis ([place]) 08/04/2016  . Protein-calorie malnutrition, severe 07/09/2016  . SBO (small bowel obstruction)   . Malnutrition of moderate degree 07/05/2016  . Abdominal pain 07/04/2016  . Scrotal abscess 07/03/2016  . Cellulitis of scrotum 07/03/2016  . Coronary artery disease involving native coronary artery of native heart with angina pectoris ([place])   . Leukocytosis 07/06/2015  . GERD (gastroesophageal reflux disease) 06/13/2013  . Vitamin D deficiency 06/13/2013  . Degenerative disc disease, cervical 06/13/2013  . Degenerative disc disease, lumbar 06/13/2013  . Multiple thyroid nodules 04/07/2012  . Mixed hyperlipidemia 01/27/2011  . Coronary atherosclerosis of native coronary artery   . Sleep  apnea   . Tobacco use disorder   . Essential hypertension, benign   . 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 amLODipine (NORVASC) 10 MG tablet TAKE 1 TABLET BY MOUTH DAILY (Patient taking differently: TAKE '10MG'$  BY MOUTH DAILY)  . atorvastatin (LIPITOR) 20 MG tablet TAKE 1 TABLET BY MOUTH EVERY DAY (Patient taking differently: TAKE '20MG'$  BY MOUTH EVERY DAY)  . diazepam (VALIUM) 10 MG tablet Take 1 tablet (10 mg total) by mouth 2 (two) times daily as needed.  Marland Kitchen ELIQUIS 5 MG TABS tablet TAKE 1 TABLET BY MOUTH TWICE DAILY  . fenofibrate 160 MG tablet TAKE 1 TABLET BY MOUTH DAILY  . fish oil-omega-3 fatty acids 1000 MG capsule Take 1 g by mouth daily.   Marland Kitchen glimepiride (AMARYL) 2 MG tablet TAKE 1 TABLET BY MOUTH EVERY DAY  . hydrochlorothiazide (MICROZIDE) 12.5 MG capsule TAKE 1 CAPSULE BY MOUTH EVERY DAY  . isosorbide mononitrate (IMDUR) 30 MG 24 hr tablet TAKE 1 TABLET BY MOUTH DAILY  . lactulose (CHRONULAC) 10 GM/15ML solution Take 15 mLs (10 g total) by mouth daily as needed for mild constipation, moderate constipation or severe constipation. Reported on 11/11/2015  . metoprolol (LOPRESSOR) 50 MG tablet TAKE 1/2 TABLET BY MOUTH TWICE DAILY (Patient taking differently: TAKE 1/2 TABLET BY MOUTH TWICE DAILY. 12.'5MG'$  IN THE MORNING AND 12.'5MG'$  IN THE EVENING)  . morphine (MS CONTIN) 30 MG 12 hr tablet Take 30 mg by mouth 3 (three) times daily.  Marland Kitchen oxyCODONE-acetaminophen (PERCOCET) 10-325 MG per  tablet Take 1 tablet by mouth 6 (six) times daily.   . pantoprazole (PROTONIX) 40 MG tablet TAKE 1 TABLET BY MOUTH TWICE DAILY (Patient taking differently: TAKE 38m  BY MOUTH TWICE DAILY)  . quinapril (ACCUPRIL) 20 MG tablet TAKE 1 TABLET BY MOUTH EVERY DAY  . senna-docusate (SENOKOT S) 8.6-50 MG tablet Take 1 tablet by mouth as needed for mild constipation.  . sucralfate (CARAFATE) 1 g tablet Take 1  tablet (1 g total) by mouth 4 (four) times daily -  with meals and at bedtime.  . Vitamin D, Ergocalciferol, (DRISDOL) 50000 units CAPS capsule TAKE 1 CAPSULE BY MOUTH ONCE A WEEK  . [DISCONTINUED] cephALEXin (KEFLEX) 500 MG capsule Take 1 capsule (500 mg total) by mouth 2 (two) times daily.   No facility-administered encounter medications on file as of 10/21/2016.       Review of Systems  Constitutional: Negative.   HENT: Negative.   Eyes: Negative.   Respiratory: Negative.   Cardiovascular: Negative.   Gastrointestinal: Negative.   Endocrine: Negative.   Genitourinary: Negative.   Musculoskeletal: Negative.   Skin: Negative.   Allergic/Immunologic: Negative.   Neurological: Negative.   Hematological: Negative.   Psychiatric/Behavioral: Negative.        Objective:   Physical Exam  Constitutional: He is oriented to person, place, and time. He appears well-developed and well-nourished. No distress.  The patient is pleasant and alert.  HENT:  Head: Normocephalic and atraumatic.  Right Ear: External ear normal.  Left Ear: External ear normal.  Nose: Nose normal.  Mouth/Throat: Oropharynx is clear and moist. No oropharyngeal exudate.  Eyes: Conjunctivae and EOM are normal. Pupils are equal, round, and reactive to light. Right eye exhibits no discharge. Left eye exhibits no discharge. No scleral icterus.  Neck: Normal range of motion. Neck supple. No thyromegaly present.  No bruits thyromegaly or anterior cervical adenopathy  Cardiovascular: Normal rate, regular rhythm, normal heart sounds and intact distal pulses.   No murmur heard. The pulses were intact distally but weak. The heart had a regular rate and rhythm at 60/m  Pulmonary/Chest: Effort normal and breath sounds normal. No respiratory distress. He has no wheezes. He has no rales. He exhibits no tenderness.  Clear anteriorly and posteriorly and no axillary adenopathy  Abdominal: Soft. Bowel sounds are normal. He exhibits  no mass. There is tenderness. There is no rebound and no guarding.  Abdominal obesity with multiple surgical scars with a ventral hernia noted.  Genitourinary:  Genitourinary Comments: The scrotal area was examined. There is a firmness at the site of these scrotal abscess which appears to be scar tissue. Both testicles were intact. There is no drainage from the scar tissue area. There was sensitivity.  Musculoskeletal: Normal range of motion. He exhibits no edema.  Lymphadenopathy:    He has no cervical adenopathy.  Neurological: He is alert and oriented to person, place, and time. He has normal reflexes. No cranial nerve deficit.  Skin: Skin is warm and dry. No rash noted.  Scrotal abscess appears to be healed without drainage.  Psychiatric: He has a normal mood and affect. His behavior is normal. Judgment and thought content normal.  Nursing note and vitals reviewed.   BP (!) 127/57 (BP Location: Left Arm)   Pulse (!) 55   Temp 98.3 F (36.8 C) (Oral)   Ht _0  (1.803 m)   Wt 194 lb (88 kg)   BMI 27.06 kg/m        Assessment &  Plan:  1. ASCVD (arteriosclerotic cardiovascular disease) -Follow-up with cardiology procedure. We will call the cardiology office and have them to call and reschedule this procedure that was planned. - CBC with Differential/Platelet - Lipid panel  2. Type 2 diabetes mellitus with diabetic neuropathy, without long-term current use of insulin (HCC) -Continue to monitor blood sugars at home - CBC with Differential/Platelet - Bayer DCA Hb A1c Waived - Microalbumin / creatinine urine ratio  3. Essential hypertension, benign -The blood pressure is good today and the patient will continue with current treatment - BMP8+EGFR - CBC with Differential/Platelet - Hepatic function panel  4. Mixed hyperlipidemia -Continue with current medicine and aggressive therapeutic lifestyle changes - CBC with Differential/Platelet - Lipid panel  5. Vitamin D  deficiency -Continue with current treatment pending results of lab work - CBC with Differential/Platelet - VITAMIN D 25 Hydroxy (Vit-D Deficiency, Fractures)  6. Gastroesophageal reflux disease, esophagitis presence not specified -The patient has no complaints with reflux and he will continue with his proton X - CBC with Differential/Platelet - Hepatic function panel  7. Scrotal abscess -If any hint of recurring problems with this abscess he should call us so we can arrange a visit with the urologist as soon as possible  Meds ordered this encounter  Medications  . diazepam (VALIUM) 10 MG tablet    Sig: Take 1 tablet (10 mg total) by mouth 2 (two) times daily as needed.    Dispense:  60 tablet    Refill:  4    Generic [id number]    10MG   Patient Instructions                       Medicare Annual Wellness Visit  [facility] and the medical providers at [facility] strive to bring you the best medical care.  In doing so we not only want to address your current medical conditions and concerns but also to detect new conditions early and prevent illness, disease and health-related problems.    Medicare offers a yearly Wellness Visit which allows our clinical staff to assess your need for preventative services including immunizations, lifestyle education, counseling to decrease risk of preventable diseases and screening for fall risk and other medical concerns.    This visit is provided free of charge (no copay) for all Medicare recipients. The clinical pharmacists at [facility] have begun to conduct these Wellness Visits which will also include a thorough review of all your medications.    As you primary medical provider recommend that you make an appointment for your Annual Wellness Visit if you have not done so already this year.  You may set up this appointment before you leave today or you may call back [phone]) and schedule an  appointment.  Please make sure when you call that you mention that you are scheduling your Annual Wellness Visit with the clinical pharmacist so that the appointment may be made for the proper length of time.     Continue current medications. Continue good therapeutic lifestyle changes which include good diet and exercise. Fall precautions discussed with patient. If an FOBT was given today- please return it to our front desk. If you are over 62 years old - you may need Prevnar 70 or the adult Pneumonia vaccine.  **Flu shots are available--- please call and schedule a FLU-CLINIC appointment**  After your visit with [place] today you will receive a survey in the mail or online from [employer] regarding your care with [place]. Please take a moment to fill this out. Your feedback is very important to [place] as you can help [place] better understand your patient needs as well as improve your experience and satisfaction. WE CARE ABOUT YOU!!!   The patient should follow-up with the urologist if he develops more problems with his scrotal abscess. He should continue to check his blood sugars periodically at home He should always check his feet He needs to be scheduled for an eye exam and he is responsible for doing this.    Arrie Senate MD

## 2016-10-22 LAB — BMP8+EGFR
BUN/Creatinine Ratio: 21 (ref 10–24)
BUN: 15 mg/dL (ref 8–27)
CO2: 27 mmol/L (ref 18–29)
CREATININE: 0.72 mg/dL — AB (ref 0.76–1.27)
Calcium: 8.1 mg/dL — ABNORMAL LOW (ref 8.6–10.2)
Chloride: 103 mmol/L (ref 96–106)
GFR calc Af Amer: 106 mL/min/{1.73_m2} (ref >59)
GFR calc non Af Amer: 92 mL/min/{1.73_m2} (ref >59)
GLUCOSE: 76 mg/dL (ref 65–99)
POTASSIUM: 4 mmol/L (ref 3.5–5.2)
SODIUM: 143 mmol/L (ref 134–144)

## 2016-10-22 LAB — CBC WITH DIFFERENTIAL/PLATELET
BASOS: 1 %
Basophils Absolute: 0.1 10*3/uL (ref 0.0–0.2)
EOS (ABSOLUTE): 0.5 10*3/uL — ABNORMAL HIGH (ref 0.0–0.4)
EOS: 4 %
HEMATOCRIT: 38.4 % (ref 37.5–51.0)
Hemoglobin: 12.9 g/dL — ABNORMAL LOW (ref 13.0–17.7)
Immature Grans (Abs): 0 10*3/uL (ref 0.0–0.1)
Immature Granulocytes: 0 %
LYMPHS ABS: 5.3 10*3/uL — AB (ref 0.7–3.1)
Lymphs: 40 %
MCH: 29.5 pg (ref 26.6–33.0)
MCHC: 33.6 g/dL (ref 31.5–35.7)
MCV: 88 fL (ref 79–97)
MONOS ABS: 1.1 10*3/uL — AB (ref 0.1–0.9)
Monocytes: 8 %
Neutrophils Absolute: 6.1 10*3/uL (ref 1.4–7.0)
Neutrophils: 47 %
Platelets: 409 10*3/uL — ABNORMAL HIGH (ref 150–379)
RBC: 4.37 x10E6/uL (ref 4.14–5.80)
RDW: 14.1 % (ref 12.3–15.4)
WBC: 13.1 10*3/uL — AB (ref 3.4–10.8)

## 2016-10-22 LAB — HEPATIC FUNCTION PANEL
ALT: 12 IU/L (ref 0–44)
AST: 16 IU/L (ref 0–40)
Albumin: 3.8 g/dL (ref 3.5–4.8)
Alkaline Phosphatase: 44 IU/L (ref 39–117)
Bilirubin Total: 0.3 mg/dL (ref 0.0–1.2)
Bilirubin, Direct: 0.16 mg/dL (ref 0.00–0.40)
Total Protein: 6.4 g/dL (ref 6.0–8.5)

## 2016-10-22 LAB — LIPID PANEL
Chol/HDL Ratio: 4.5 ratio (ref 0.0–5.0)
Cholesterol, Total: 103 mg/dL (ref 100–199)
HDL: 23 mg/dL — AB (ref >39)
LDL Calculated: 45 mg/dL (ref 0–99)
TRIGLYCERIDES: 175 mg/dL — AB (ref 0–149)
VLDL Cholesterol Cal: 35 mg/dL (ref 5–40)

## 2016-10-22 LAB — MICROALBUMIN / CREATININE URINE RATIO
CREATININE, UR: 47.4 mg/dL
MICROALB/CREAT RATIO: 505.1 mg/g{creat} — AB (ref 0.0–30.0)
Microalbumin, Urine: 239.4 ug/mL

## 2016-10-22 LAB — VITAMIN D 25 HYDROXY (VIT D DEFICIENCY, FRACTURES): Vit D, 25-Hydroxy: 47.3 ng/mL (ref 30.0–100.0)

## 2016-10-28 ENCOUNTER — Other Ambulatory Visit: Payer: Self-pay

## 2016-10-28 ENCOUNTER — Telehealth: Payer: Self-pay

## 2016-10-28 ENCOUNTER — Ambulatory Visit (INDEPENDENT_AMBULATORY_CARE_PROVIDER_SITE_OTHER): Payer: Medicare Other

## 2016-10-28 DIAGNOSIS — I484 Atypical atrial flutter: Secondary | ICD-10-CM | POA: Diagnosis not present

## 2016-10-28 NOTE — Telephone Encounter (Signed)
-----   Message from Satira Sark, MD sent at 10/28/2016  1:38 PM EDT ----- Results reviewed. LVEF remains normal range of 60-65%. Continue medical therapy and routine follow-up. A copy of this test should be forwarded to Redge Gainer, MD.

## 2016-10-28 NOTE — Telephone Encounter (Signed)
Patient notified. Routed to PCP 

## 2016-11-09 DIAGNOSIS — Z7984 Long term (current) use of oral hypoglycemic drugs: Secondary | ICD-10-CM | POA: Diagnosis not present

## 2016-11-09 DIAGNOSIS — H26493 Other secondary cataract, bilateral: Secondary | ICD-10-CM | POA: Diagnosis not present

## 2016-11-09 DIAGNOSIS — Z961 Presence of intraocular lens: Secondary | ICD-10-CM | POA: Diagnosis not present

## 2016-11-09 DIAGNOSIS — E119 Type 2 diabetes mellitus without complications: Secondary | ICD-10-CM | POA: Diagnosis not present

## 2016-11-20 ENCOUNTER — Other Ambulatory Visit: Payer: Self-pay | Admitting: Family Medicine

## 2016-11-20 DIAGNOSIS — I4891 Unspecified atrial fibrillation: Secondary | ICD-10-CM

## 2016-11-20 DIAGNOSIS — G459 Transient cerebral ischemic attack, unspecified: Secondary | ICD-10-CM

## 2016-12-26 ENCOUNTER — Other Ambulatory Visit: Payer: Self-pay | Admitting: Family Medicine

## 2017-01-04 ENCOUNTER — Other Ambulatory Visit: Payer: Self-pay | Admitting: Family Medicine

## 2017-01-05 ENCOUNTER — Other Ambulatory Visit: Payer: Self-pay | Admitting: *Deleted

## 2017-01-05 ENCOUNTER — Other Ambulatory Visit: Payer: Medicare Other

## 2017-01-05 DIAGNOSIS — Z1211 Encounter for screening for malignant neoplasm of colon: Secondary | ICD-10-CM

## 2017-01-06 DIAGNOSIS — Z1211 Encounter for screening for malignant neoplasm of colon: Secondary | ICD-10-CM | POA: Diagnosis not present

## 2017-01-09 LAB — FECAL OCCULT BLOOD, IMMUNOCHEMICAL: Fecal Occult Bld: POSITIVE — AB

## 2017-01-10 ENCOUNTER — Other Ambulatory Visit: Payer: Self-pay | Admitting: *Deleted

## 2017-01-10 DIAGNOSIS — R195 Other fecal abnormalities: Secondary | ICD-10-CM

## 2017-01-19 ENCOUNTER — Other Ambulatory Visit: Payer: Self-pay | Admitting: Family Medicine

## 2017-01-20 DIAGNOSIS — Z79891 Long term (current) use of opiate analgesic: Secondary | ICD-10-CM | POA: Diagnosis not present

## 2017-01-20 DIAGNOSIS — G8929 Other chronic pain: Secondary | ICD-10-CM | POA: Diagnosis not present

## 2017-01-20 DIAGNOSIS — M5442 Lumbago with sciatica, left side: Secondary | ICD-10-CM | POA: Diagnosis not present

## 2017-01-20 DIAGNOSIS — G894 Chronic pain syndrome: Secondary | ICD-10-CM | POA: Diagnosis not present

## 2017-01-20 DIAGNOSIS — M5136 Other intervertebral disc degeneration, lumbar region: Secondary | ICD-10-CM | POA: Diagnosis not present

## 2017-01-24 ENCOUNTER — Other Ambulatory Visit: Payer: Self-pay | Admitting: Family Medicine

## 2017-02-02 ENCOUNTER — Other Ambulatory Visit: Payer: Self-pay | Admitting: Family Medicine

## 2017-02-17 ENCOUNTER — Ambulatory Visit: Payer: Medicare Other | Admitting: Neurology

## 2017-02-17 ENCOUNTER — Telehealth: Payer: Self-pay | Admitting: Family Medicine

## 2017-02-17 NOTE — Telephone Encounter (Signed)
Pt notified samples are ready for pick-up.  

## 2017-02-21 DIAGNOSIS — L570 Actinic keratosis: Secondary | ICD-10-CM | POA: Diagnosis not present

## 2017-02-23 ENCOUNTER — Ambulatory Visit (INDEPENDENT_AMBULATORY_CARE_PROVIDER_SITE_OTHER): Payer: Medicare Other | Admitting: Family Medicine

## 2017-02-23 ENCOUNTER — Encounter: Payer: Self-pay | Admitting: Family Medicine

## 2017-02-23 VITALS — BP 114/59 | HR 60 | Temp 98.6°F | Ht 71.0 in | Wt 193.0 lb

## 2017-02-23 DIAGNOSIS — E1140 Type 2 diabetes mellitus with diabetic neuropathy, unspecified: Secondary | ICD-10-CM | POA: Diagnosis not present

## 2017-02-23 DIAGNOSIS — E559 Vitamin D deficiency, unspecified: Secondary | ICD-10-CM

## 2017-02-23 DIAGNOSIS — R110 Nausea: Secondary | ICD-10-CM

## 2017-02-23 DIAGNOSIS — E782 Mixed hyperlipidemia: Secondary | ICD-10-CM | POA: Diagnosis not present

## 2017-02-23 DIAGNOSIS — I2510 Atherosclerotic heart disease of native coronary artery without angina pectoris: Secondary | ICD-10-CM | POA: Diagnosis not present

## 2017-02-23 DIAGNOSIS — R0789 Other chest pain: Secondary | ICD-10-CM | POA: Diagnosis not present

## 2017-02-23 DIAGNOSIS — I10 Essential (primary) hypertension: Secondary | ICD-10-CM | POA: Diagnosis not present

## 2017-02-23 DIAGNOSIS — K219 Gastro-esophageal reflux disease without esophagitis: Secondary | ICD-10-CM | POA: Diagnosis not present

## 2017-02-23 DIAGNOSIS — R8290 Unspecified abnormal findings in urine: Secondary | ICD-10-CM

## 2017-02-23 DIAGNOSIS — N400 Enlarged prostate without lower urinary tract symptoms: Secondary | ICD-10-CM | POA: Diagnosis not present

## 2017-02-23 LAB — BAYER DCA HB A1C WAIVED: HB A1C: 5.3 % (ref <7.0)

## 2017-02-23 NOTE — Patient Instructions (Addendum)
Medicare Annual Wellness Visit  [place] [facility] and the medical providers at [facility] strive to bring you the best medical care.  In doing so we not only want to address your current medical conditions and concerns but also to detect new conditions early and prevent illness, disease and health-related problems.    Medicare offers a yearly Wellness Visit which allows our clinical staff to assess your need for preventative services including immunizations, lifestyle education, counseling to decrease risk of preventable diseases and screening for fall risk and other medical concerns.    This visit is provided free of charge (no copay) for all Medicare recipients. The clinical pharmacists at [facility] have begun to conduct these Wellness Visits which will also include a thorough review of all your medications.    As you primary medical provider recommend that you make an appointment for your Annual Wellness Visit if you have not done so already this year.  You may set up this appointment before you leave today or you may call back ([phone]) and schedule an appointment.  Please make sure when you call that you mention that you are scheduling your Annual Wellness Visit with the clinical pharmacist so that the appointment may be made for the proper length of time.     Continue current medications. Continue good therapeutic lifestyle changes which include good diet and exercise. Fall precautions discussed with patient. If an FOBT was given today- please return it to our front desk. If you are over 74 years old - you may need Prevnar 63 or the adult Pneumonia vaccine.  **Flu shots are available--- please call and schedule a FLU-CLINIC appointment**  After your visit with [place] today you will receive a survey in the mail or online from [employer] regarding your care with [place]. Please take a moment to fill this out. Your feedback is very  important to [place] as you can help [place] better understand your patient needs as well as improve your experience and satisfaction. WE CARE ABOUT YOU!!!   Follow through with planned visit to cardiology We will also make an arrangement for you to see the gastroenterologist because of this episode of chest pain and nausea Continue with pantoprazole twice daily

## 2017-02-23 NOTE — Addendum Note (Signed)
Addended by: Zannie Cove on: 02/23/2017 12:13 PM   Modules accepted: Orders

## 2017-02-23 NOTE — Progress Notes (Signed)
Subjective:    Patient ID: Dustin Dominguez, male    DOB: [birth year], 76 y.o.   MRN: [id number]  HPI  Pt here for follow up and management of chronic medical problems which includes diabetes, hyperlipidemia and hypertension. He is taking medication regularly.The patient is doing well overall. He does have diabetes and chronic neck and back pain. He also complains of GERD. He is currently taking pantoprazole for this. He is not needing any refills on his medicines. The patient does complain of severe episode of heartburn and chest pain and shortness of breath that occurred about 7 days ago. He was just sitting watching TV when this happened. It lasted less than half of a day. He was still having some nausea the second day but this is seen to gotten better since then. He was taking his pantoprazole twice daily when this happened and has not been taking any kind of NSAIDs and cannot elicit anything that caused this GI wise. He says at the time he should've gone to the emergency room but did not. He subsequently has obtained an appointment with cardiology which will be next week. Says this episode 1 week ago the shortness of breath which he has all the time is better. The chest pain is better. He has not seen any blood in the stool or had any black tarry bowel movements. He continues to take his Protonix twice daily. He denies any trouble with passing his water other than some frequency. His blood sugars at home and been running in the 100-120 range and sometimes lower. He did have an eye exam back in the early part of the year with my eye doctor in [place].    Patient Active Problem List   Diagnosis Date Noted  . Abdominal aortic atherosclerosis ([place]) 08/04/2016  . Protein-calorie malnutrition, severe 07/09/2016  . SBO (small bowel obstruction) ([place])   . Malnutrition of moderate degree 07/05/2016  . Abdominal pain 07/04/2016  . Scrotal abscess 07/03/2016  . Cellulitis of scrotum 07/03/2016  . Coronary  artery disease involving native coronary artery of native heart with angina pectoris ([place])   . Leukocytosis 07/06/2015  . GERD (gastroesophageal reflux disease) 06/13/2013  . Vitamin D deficiency 06/13/2013  . Degenerative disc disease, cervical 06/13/2013  . Degenerative disc disease, lumbar 06/13/2013  . Multiple thyroid nodules 04/07/2012  . Mixed hyperlipidemia 01/27/2011  . Coronary atherosclerosis of native coronary artery   . Sleep apnea   . Tobacco use disorder   . Essential hypertension, benign   . Diabetes mellitus type 2 with atherosclerosis of arteries of extremities [facility])    Outpatient Encounter Prescriptions as of 02/23/2017  Medication Sig  . amLODipine (NORVASC) 10 MG tablet TAKE 1 TABLET BY MOUTH DAILY  . atorvastatin (LIPITOR) 20 MG tablet TAKE 1 TABLET BY MOUTH EVERY DAY  . diazepam (VALIUM) 10 MG tablet Take 1 tablet (10 mg total) by mouth 2 (two) times daily as needed.  Marland Kitchen ELIQUIS 5 MG TABS tablet TAKE 1 TABLET BY MOUTH TWICE DAILY  . fenofibrate 160 MG tablet TAKE 1 TABLET BY MOUTH DAILY  . fish oil-omega-3 fatty acids 1000 MG capsule Take 1 g by mouth daily.   Marland Kitchen glimepiride (AMARYL) 2 MG tablet TAKE 1 TABLET BY MOUTH EVERY DAY DO not fill UNTIL asked  . hydrochlorothiazide (MICROZIDE) 12.5 MG capsule TAKE 1 CAPSULE BY MOUTH EVERY DAY  . isosorbide mononitrate (IMDUR) 30 MG 24 hr tablet TAKE 1 TABLET BY MOUTH DAILY  . lactulose ([place])  10 GM/15ML solution Take 15 mLs (10 g total) by mouth daily as needed for mild constipation, moderate constipation or severe constipation. Reported on 11/11/2015  . metoprolol tartrate (LOPRESSOR) 50 MG tablet TAKE 1/2 TABLET BY MOUTH TWICE DAILY  . morphine (MS CONTIN) 30 MG 12 hr tablet Take 30 mg by mouth 3 (three) times daily.  Marland Kitchen oxyCODONE-acetaminophen (PERCOCET) 10-325 MG per tablet Take 1 tablet by mouth 6 (six) times daily.   . pantoprazole (PROTONIX) 40 MG tablet TAKE 1 TABLET BY MOUTH TWICE DAILY  . quinapril (ACCUPRIL) 20  MG tablet TAKE 1 TABLET BY MOUTH EVERY DAY  . senna-docusate (SENOKOT S) 8.6-50 MG tablet Take 1 tablet by mouth as needed for mild constipation.  . sucralfate (CARAFATE) 1 g tablet Take 1 tablet (1 g total) by mouth 4 (four) times daily -  with meals and at bedtime.  . Vitamin D, Ergocalciferol, (DRISDOL) 50000 units CAPS capsule TAKE 1 CAPSULE BY MOUTH ONCE A WEEK   No facility-administered encounter medications on file as of 02/23/2017.       Review of Systems  Constitutional: Negative.   HENT: Negative.   Eyes: Negative.   Respiratory: Negative.   Cardiovascular: Negative.   Gastrointestinal: Negative.        Heartburn and chest pressure at times - he has appt with cardio next week.  Endocrine: Negative.   Genitourinary: Negative.   Musculoskeletal: Negative.   Skin: Negative.   Allergic/Immunologic: Negative.   Neurological: Negative.   Hematological: Negative.   Psychiatric/Behavioral: Negative.        Objective:   Physical Exam  Constitutional: He is oriented to person, place, and time. He appears well-developed and well-nourished. No distress.  The patient is pleasant and alert and concerned about the episode of chest discomfort and nausea that he had a week ago.  HENT:  Head: Normocephalic and atraumatic.  Right Ear: External ear normal.  Left Ear: External ear normal.  Nose: Nose normal.  Mouth/Throat: Oropharynx is clear and moist. No oropharyngeal exudate.  Eyes: Pupils are equal, round, and reactive to light. Conjunctivae and EOM are normal. Right eye exhibits no discharge. Left eye exhibits no discharge. No scleral icterus.  The patient did have an eye exam earlier this year at my eye doctor in [facility] and no change in prescription was follow-through with by the patient.  Neck: Normal range of motion. Neck supple. No thyromegaly present.  No bruits thyromegaly or anterior cervical adenopathy  Cardiovascular: Normal rate, regular rhythm, normal heart  sounds and intact distal pulses.   No murmur heard. The heart is regular at 60/m  Pulmonary/Chest: Effort normal and breath sounds normal. No respiratory distress. He has no wheezes. He has no rales. He exhibits no tenderness.  Clear anteriorly and posteriorly  Abdominal: Soft. Bowel sounds are normal. He exhibits no mass. There is no tenderness. There is no rebound and no guarding.  Protuberant abdomen secondary to multiple surgeries. Right upper quadrant abdominal tenderness but patient says this is no different from the past. He has multiple scars from previous surgeries and injuries. No liver or spleen enlargement good inguinal pulses without inguinal adenopathy  Genitourinary: Rectum normal and penis normal.  Genitourinary Comments: The prostate is minimally enlarged if enlarged at all. There were no rectal masses. The external genitalia had more swelling on the left side without drainage or redness. He is very sensitive on the side is where the abscess was located. No hernias were palpable.  Musculoskeletal: Normal  range of motion. He exhibits deformity. He exhibits no edema or tenderness.  The patient has a kyphotic posture and a contorted posture with a curvature. He is being treated for his chronic pain by Dr. Nelva Bush  Lymphadenopathy:    He has no cervical adenopathy.  Neurological: He is alert and oriented to person, place, and time. He has normal reflexes. No cranial nerve deficit.  Skin: Skin is warm and dry. No rash noted.  Psychiatric: He has a normal mood and affect. His behavior is normal. Judgment and thought content normal.  Nursing note and vitals reviewed.  BP (!) 114/59 (BP Location: Left Arm)   Pulse 60   Temp 98.6 F (37 C) (Oral)   Ht '5\' 11"'$  (1.803 m)   Wt 193 lb (87.5 kg)   BMI 26.92 kg/m   EKG with results pending      Assessment & Plan:  1. ASCVD (arteriosclerotic cardiovascular disease) -The patient has a history of having had a CABG done in the year  2000. - CBC with Differential/Platelet - Lipid panel  2. Type 2 diabetes mellitus with diabetic neuropathy, without long-term current use of insulin ([place]) -He says that his blood sugars at home and been running between 100 220 generally speaking but usually lower than that. - CBC with Differential/Platelet - Bayer DCA Hb A1c Waived  3. Mixed hyperlipidemia He should continue with current cholesterol treatment pending results of lab work CBC with Differential/Platelet - Lipid panel  4. Essential hypertension, benign -The blood pressure is good today and he will continue with current treatment - CBC with Differential/Platelet - BMP8+EGFR - Hepatic function panel  5. Vitamin D deficiency -Continue with current treatment pending results of lab work - CBC with Differential/Platelet - VITAMIN D 25 Hydroxy (Vit-D Deficiency, Fractures)  6. Gastroesophageal reflux disease, esophagitis presence not specified -Continue with pantoprazole twice daily and follow-up visit with gastroenterology - CBC with Differential/Platelet - Hepatic function panel - EKG 12-Lead - Ambulatory referral to Gastroenterology  7. Chest pressure -Keep appointment with cardiology next week - EKG 12-Lead  8. Nausea -Continue with pantoprazole twice daily and avoid all NSAIDs which she is currently doing - Ambulatory referral to Gastroenterology  Patient Instructions                       Medicare Annual Wellness Visit  Antoine and the medical providers at [facility] strive to bring you the best medical care.  In doing so we not only want to address your current medical conditions and concerns but also to detect new conditions early and prevent illness, disease and health-related problems.    Medicare offers a yearly Wellness Visit which allows our clinical staff to assess your need for preventative services including immunizations, lifestyle education, counseling to decrease risk of  preventable diseases and screening for fall risk and other medical concerns.    This visit is provided free of charge (no copay) for all Medicare recipients. The clinical pharmacists at [facility] have begun to conduct these Wellness Visits which will also include a thorough review of all your medications.    As you primary medical provider recommend that you make an appointment for your Annual Wellness Visit if you have not done so already this year.  You may set up this appointment before you leave today or you may call back ([phone]) and schedule an appointment.  Please make sure when you call that you mention that you  are scheduling your Annual Wellness Visit with the clinical pharmacist so that the appointment may be made for the proper length of time.     Continue current medications. Continue good therapeutic lifestyle changes which include good diet and exercise. Fall precautions discussed with patient. If an FOBT was given today- please return it to our front desk. If you are over 68 years old - you may need Prevnar 73 or the adult Pneumonia vaccine.  **Flu shots are available--- please call and schedule a FLU-CLINIC appointment**  After your visit with [place] today you will receive a survey in the mail or online from [employer] regarding your care with [place]. Please take a moment to fill this out. Your feedback is very important to [place] as you can help [place] better understand your patient needs as well as improve your experience and satisfaction. WE CARE ABOUT YOU!!!   Follow through with planned visit to cardiology We will also make an arrangement for you to see the gastroenterologist because of this episode of chest pain and nausea Continue with pantoprazole twice daily    Arrie Senate MD

## 2017-02-24 LAB — BMP8+EGFR
BUN / CREAT RATIO: 18 (ref 10–24)
BUN: 16 mg/dL (ref 8–27)
CHLORIDE: 104 mmol/L (ref 96–106)
CO2: 26 mmol/L (ref 20–29)
CREATININE: 0.88 mg/dL (ref 0.76–1.27)
Calcium: 8.8 mg/dL (ref 8.6–10.2)
GFR calc non Af Amer: 85 mL/min/{1.73_m2} (ref >59)
GFR, EST AFRICAN AMERICAN: 98 mL/min/{1.73_m2} (ref >59)
GLUCOSE: 63 mg/dL — AB (ref 65–99)
Potassium: 4.2 mmol/L (ref 3.5–5.2)
SODIUM: 143 mmol/L (ref 134–144)

## 2017-02-24 LAB — MICROSCOPIC EXAMINATION
Casts: NONE SEEN /lpf
WBC, UA: >30 /hpf — AB (ref 0–?)

## 2017-02-24 LAB — CBC WITH DIFFERENTIAL/PLATELET
BASOS ABS: 0.1 10*3/uL (ref 0.0–0.2)
BASOS: 1 %
EOS (ABSOLUTE): 0.8 10*3/uL — ABNORMAL HIGH (ref 0.0–0.4)
Eos: 6 %
HEMATOCRIT: 38.6 % (ref 37.5–51.0)
Hemoglobin: 12.9 g/dL — ABNORMAL LOW (ref 13.0–17.7)
Immature Grans (Abs): 0 10*3/uL (ref 0.0–0.1)
Immature Granulocytes: 0 %
LYMPHS ABS: 4.5 10*3/uL — AB (ref 0.7–3.1)
Lymphs: 36 %
MCH: 30.4 pg (ref 26.6–33.0)
MCHC: 33.4 g/dL (ref 31.5–35.7)
MCV: 91 fL (ref 79–97)
Monocytes Absolute: 1.3 10*3/uL — ABNORMAL HIGH (ref 0.1–0.9)
Monocytes: 9 %
NEUTROS ABS: 6.4 10*3/uL (ref 1.4–7.0)
Neutrophils: 48 %
PLATELETS: 413 10*3/uL — AB (ref 150–379)
RBC: 4.24 x10E6/uL (ref 4.14–5.80)
RDW: 13.7 % (ref 12.3–15.4)
WBC: 13.1 10*3/uL — ABNORMAL HIGH (ref 3.4–10.8)

## 2017-02-24 LAB — LIPID PANEL
CHOLESTEROL TOTAL: 130 mg/dL (ref 100–199)
Chol/HDL Ratio: 5.2 ratio — ABNORMAL HIGH (ref 0.0–5.0)
HDL: 25 mg/dL — ABNORMAL LOW (ref >39)
LDL Calculated: 63 mg/dL (ref 0–99)
Triglycerides: 212 mg/dL — ABNORMAL HIGH (ref 0–149)
VLDL CHOLESTEROL CAL: 42 mg/dL — AB (ref 5–40)

## 2017-02-24 LAB — URINALYSIS, COMPLETE
Bilirubin, UA: NEGATIVE
GLUCOSE, UA: NEGATIVE
Ketones, UA: NEGATIVE
NITRITE UA: NEGATIVE
SPEC GRAV UA: 1.017 (ref 1.005–1.030)
UUROB: 1 mg/dL (ref 0.2–1.0)
pH, UA: 5.5 (ref 5.0–7.5)

## 2017-02-24 LAB — HEPATIC FUNCTION PANEL
ALK PHOS: 50 IU/L (ref 39–117)
ALT: 12 IU/L (ref 0–44)
AST: 21 IU/L (ref 0–40)
Albumin: 4.1 g/dL (ref 3.5–4.8)
BILIRUBIN, DIRECT: 0.18 mg/dL (ref 0.00–0.40)
Bilirubin Total: 0.4 mg/dL (ref 0.0–1.2)
TOTAL PROTEIN: 6.9 g/dL (ref 6.0–8.5)

## 2017-02-24 LAB — VITAMIN D 25 HYDROXY (VIT D DEFICIENCY, FRACTURES): VIT D 25 HYDROXY: 39.7 ng/mL (ref 30.0–100.0)

## 2017-02-25 NOTE — Addendum Note (Signed)
Addended by: Thana Ates on: 02/25/2017 04:56 PM   Modules accepted: Orders
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
## 2017-03-02 ENCOUNTER — Ambulatory Visit: Payer: Medicare Other | Admitting: Cardiology

## 2017-03-05 ENCOUNTER — Other Ambulatory Visit: Payer: Self-pay

## 2017-03-05 ENCOUNTER — Other Ambulatory Visit: Payer: Medicare Other

## 2017-03-05 DIAGNOSIS — Z87440 Personal history of urinary (tract) infections: Secondary | ICD-10-CM

## 2017-03-10 ENCOUNTER — Ambulatory Visit (INDEPENDENT_AMBULATORY_CARE_PROVIDER_SITE_OTHER): Payer: Medicare Other | Admitting: Internal Medicine

## 2017-03-11 ENCOUNTER — Telehealth: Payer: Self-pay | Admitting: Family Medicine

## 2017-03-11 LAB — URINE CULTURE

## 2017-03-11 MED ORDER — DOXYCYCLINE HYCLATE 100 MG PO TABS: 100.0000 mg | ORAL_TABLET | Freq: Two times a day (BID) | ORAL | 0 refills | Status: DC

## 2017-03-11 NOTE — Telephone Encounter (Signed)
Burning and urgency Pt wants antibiotic Culture was returned today and doxy ordered   Sent in - pt aware

## 2017-03-21 ENCOUNTER — Other Ambulatory Visit: Payer: Self-pay | Admitting: Family Medicine

## 2017-03-23 ENCOUNTER — Other Ambulatory Visit: Payer: Self-pay | Admitting: Family Medicine

## 2017-03-25 ENCOUNTER — Telehealth: Payer: Self-pay | Admitting: Family Medicine

## 2017-03-25 NOTE — Telephone Encounter (Signed)
Patient had UTI and was treated the end of August.  He feels like this may not have cleared up.  Would like to know if we recommend he come here to be seen or go to the ER.  I told him if he felt he could not wait until in the morning, if he was having severe abdominal pain or high fever he should go on to the ER.  Otherwise, he should come here.  Appointment made in the morning at 8:30 am.

## 2017-03-26 ENCOUNTER — Encounter (HOSPITAL_COMMUNITY): Payer: Self-pay

## 2017-03-26 ENCOUNTER — Ambulatory Visit (INDEPENDENT_AMBULATORY_CARE_PROVIDER_SITE_OTHER): Payer: Medicare Other | Admitting: Family Medicine

## 2017-03-26 ENCOUNTER — Emergency Department (HOSPITAL_COMMUNITY)
Admission: EM | Admit: 2017-03-26 | Discharge: 2017-03-26 | Disposition: A | Discharge status: Home / Self Care | Payer: Medicare Other | Attending: Emergency Medicine | Admitting: Emergency Medicine

## 2017-03-26 ENCOUNTER — Emergency Department (HOSPITAL_COMMUNITY): Payer: Medicare Other

## 2017-03-26 VITALS — BP 125/65 | HR 60 | Temp 97.2°F | Ht 71.0 in | Wt 188.0 lb

## 2017-03-26 DIAGNOSIS — E119 Type 2 diabetes mellitus without complications: Secondary | ICD-10-CM | POA: Diagnosis not present

## 2017-03-26 DIAGNOSIS — I252 Old myocardial infarction: Secondary | ICD-10-CM | POA: Diagnosis not present

## 2017-03-26 DIAGNOSIS — N50811 Right testicular pain: Secondary | ICD-10-CM

## 2017-03-26 DIAGNOSIS — I2510 Atherosclerotic heart disease of native coronary artery without angina pectoris: Secondary | ICD-10-CM | POA: Insufficient documentation

## 2017-03-26 DIAGNOSIS — N492 Inflammatory disorders of scrotum: Secondary | ICD-10-CM | POA: Insufficient documentation

## 2017-03-26 DIAGNOSIS — Z951 Presence of aortocoronary bypass graft: Secondary | ICD-10-CM | POA: Diagnosis not present

## 2017-03-26 DIAGNOSIS — N433 Hydrocele, unspecified: Secondary | ICD-10-CM | POA: Diagnosis not present

## 2017-03-26 DIAGNOSIS — N5089 Other specified disorders of the male genital organs: Secondary | ICD-10-CM | POA: Diagnosis not present

## 2017-03-26 DIAGNOSIS — Z7984 Long term (current) use of oral hypoglycemic drugs: Secondary | ICD-10-CM | POA: Insufficient documentation

## 2017-03-26 DIAGNOSIS — F17210 Nicotine dependence, cigarettes, uncomplicated: Secondary | ICD-10-CM | POA: Diagnosis not present

## 2017-03-26 DIAGNOSIS — I10 Essential (primary) hypertension: Secondary | ICD-10-CM | POA: Diagnosis not present

## 2017-03-26 DIAGNOSIS — Z7901 Long term (current) use of anticoagulants: Secondary | ICD-10-CM | POA: Insufficient documentation

## 2017-03-26 DIAGNOSIS — N50819 Testicular pain, unspecified: Secondary | ICD-10-CM | POA: Diagnosis present

## 2017-03-26 LAB — URINALYSIS, ROUTINE W REFLEX MICROSCOPIC
Bilirubin Urine: NEGATIVE
Glucose, UA: NEGATIVE mg/dL
Ketones, ur: NEGATIVE mg/dL
NITRITE: NEGATIVE
PH: 5 (ref 5.0–8.0)
Protein, ur: 30 mg/dL — AB
SPECIFIC GRAVITY, URINE: 1.019 (ref 1.005–1.030)

## 2017-03-26 LAB — CBC WITH DIFFERENTIAL/PLATELET
BASOS ABS: 0.1 10*3/uL (ref 0.0–0.1)
BASOS PCT: 1 %
EOS ABS: 0.7 10*3/uL (ref 0.0–0.7)
EOS PCT: 5 %
HCT: 34 % — ABNORMAL LOW (ref 39.0–52.0)
Hemoglobin: 11.8 g/dL — ABNORMAL LOW (ref 13.0–17.0)
Lymphocytes Relative: 40 %
Lymphs Abs: 5.2 10*3/uL — ABNORMAL HIGH (ref 0.7–4.0)
MCH: 31.1 pg (ref 26.0–34.0)
MCHC: 34.7 g/dL (ref 30.0–36.0)
MCV: 89.5 fL (ref 78.0–100.0)
MONO ABS: 1.2 10*3/uL — AB (ref 0.1–1.0)
Monocytes Relative: 9 %
NEUTROS ABS: 5.9 10*3/uL (ref 1.7–7.7)
Neutrophils Relative %: 45 %
PLATELETS: 319 10*3/uL (ref 150–400)
RBC: 3.8 MIL/uL — ABNORMAL LOW (ref 4.22–5.81)
RDW: 14.2 % (ref 11.5–15.5)
WBC: 13.1 10*3/uL — ABNORMAL HIGH (ref 4.0–10.5)

## 2017-03-26 LAB — COMPREHENSIVE METABOLIC PANEL
ALT: 17 U/L (ref 17–63)
AST: 26 U/L (ref 15–41)
Albumin: 3 g/dL — ABNORMAL LOW (ref 3.5–5.0)
Alkaline Phosphatase: 51 U/L (ref 38–126)
Anion gap: 8 (ref 5–15)
BUN: 18 mg/dL (ref 6–20)
CHLORIDE: 106 mmol/L (ref 101–111)
CO2: 28 mmol/L (ref 22–32)
CREATININE: 0.79 mg/dL (ref 0.61–1.24)
Calcium: 7.3 mg/dL — ABNORMAL LOW (ref 8.9–10.3)
GFR calc Af Amer: >60 mL/min (ref >60)
Glucose, Bld: 145 mg/dL — ABNORMAL HIGH (ref 65–99)
POTASSIUM: 3.1 mmol/L — AB (ref 3.5–5.1)
SODIUM: 142 mmol/L (ref 135–145)
Total Bilirubin: 0.7 mg/dL (ref 0.3–1.2)
Total Protein: 6.1 g/dL — ABNORMAL LOW (ref 6.5–8.1)

## 2017-03-26 MED ORDER — SULFAMETHOXAZOLE-TRIMETHOPRIM 800-160 MG PO TABS: 1.0000 | ORAL_TABLET | Freq: Two times a day (BID) | ORAL | 0 refills | Status: AC

## 2017-03-26 MED ORDER — SULFAMETHOXAZOLE-TRIMETHOPRIM 800-160 MG PO TABS
1.0000 | ORAL_TABLET | Freq: Once | ORAL | Status: AC
  Administered 2017-03-26: 1 via ORAL
  Filled 2017-03-26: qty 1

## 2017-03-26 MED ORDER — NAPROXEN 500 MG PO TABS: 500.0000 mg | ORAL_TABLET | Freq: Two times a day (BID) | ORAL | 0 refills | Status: DC

## 2017-03-26 NOTE — Progress Notes (Signed)
   HPI  Patient presents today here with R testicle pain  Patient has a history of recurrent resistant UTIs. He has a recent UTI with Pseudomonas luteola speciation was pansensitive. This was treated with doxycycline. Patient states that his pain did not improve with the doxycycline.  Patient explains that last year he had a swollen and tender left testicle that began similar to this. He states that his left testicle eventually ruptured. He states that he's been getting worse throughout the week and developed abdominal pain 3-4 days ago with emesis.  He is tolerating food and fluids. He is confident that oral medications will not help, doxycycline has been treated for a full course.  Patient states that he has persistent severe right testicle pain at this time. He has a continued draining tract from the left testicle off and on.  Patient has not had recent urology follow-up that he can describe  PMH: Smoking status noted ROS: Per HPI  Objective: BP 125/65   Pulse 60   Temp (!) 97.2 F (36.2 C) (Oral)   Ht 5\' 11"  (1.803 m)   Wt 188 lb (85.3 kg)   BMI 26.22 kg/m  Gen: NAD, alert, cooperative with exam HEENT: NCAT CV: RRR, good S1/S2, no murmur Resp: CTABL, no wheezes, non-labored Abd: SNTND, BS present, no guarding or organomegaly Ext: No edema, warm Neuro: Alert and oriented, No gross deficits GU Enlarged left testicle with crusted lesion that is likely the source of the draining tract that he describes. Testicles bilaterally are tender to the touch and palpation, right testicle is more tender than the left, there is no discrete abnormalities  Assessment and plan:  # Right testicle pain Patient with worsening right testicle pain, recent culture proven UTI, and history of left testicle abscess with persistent draining tract. I recommended emergency room transfer today as I believe he needs an ultrasound to rule out testicular abscess. According to previous notes by his PCP  he has been safely given Rocephin previously, I was going to proceed with giving him IM Rocephin, however he did agree to go to the emergency room so I believe this will be safer performed they're given his penicillin allergy. Patient has had appropriate treatment for his previous UTI that was culture proven on 8/18.  Appreciate emergency rooms assistance for this patient, the charge nurse at [facility] emergency room has been updated by myself.     Laroy Apple, MD [facility] 03/26/2017, 9:05 AM
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
## 2017-03-26 NOTE — ED Triage Notes (Addendum)
Pt sent from [facility].Pt reports bilateral testicular pain for approx 2 weeks. Pt states he had sx for abscess 10 months ago on the left testicle and reports still draining. Now bilateral testicles are hurting

## 2017-03-26 NOTE — Discharge Instructions (Signed)
Your ultrasound shows a small area of thickened skin that could be a scrotal infection - this is unusual since you just finished some antibiotics that should have treated it.  I have prescribed Bactrim - take this twice daily for 10 days. See your doctor in 2=3 days for recheck.  Er for increased pain / fever

## 2017-03-26 NOTE — ED Notes (Signed)
Pt returned from US

## 2017-03-26 NOTE — ED Notes (Signed)
Pt in [place] presently

## 2017-03-30 ENCOUNTER — Other Ambulatory Visit: Payer: Self-pay | Admitting: *Deleted

## 2017-03-30 MED ORDER — VITAMIN D (ERGOCALCIFEROL) 1.25 MG (50000 UNIT) PO CAPS: 50000.0000 [IU] | ORAL_CAPSULE | ORAL | 0 refills | Status: DC

## 2017-04-04 ENCOUNTER — Ambulatory Visit (INDEPENDENT_AMBULATORY_CARE_PROVIDER_SITE_OTHER): Payer: Medicare Other | Admitting: Internal Medicine

## 2017-04-05 ENCOUNTER — Ambulatory Visit (INDEPENDENT_AMBULATORY_CARE_PROVIDER_SITE_OTHER): Payer: Medicare Other | Admitting: Internal Medicine

## 2017-04-07 DIAGNOSIS — C44229 Squamous cell carcinoma of skin of left ear and external auricular canal: Secondary | ICD-10-CM | POA: Diagnosis not present

## 2017-04-07 DIAGNOSIS — C44329 Squamous cell carcinoma of skin of other parts of face: Secondary | ICD-10-CM | POA: Diagnosis not present

## 2017-04-07 DIAGNOSIS — D485 Neoplasm of uncertain behavior of skin: Secondary | ICD-10-CM | POA: Diagnosis not present

## 2017-04-07 DIAGNOSIS — L570 Actinic keratosis: Secondary | ICD-10-CM | POA: Diagnosis not present

## 2017-04-07 DIAGNOSIS — C4442 Squamous cell carcinoma of skin of scalp and neck: Secondary | ICD-10-CM | POA: Diagnosis not present

## 2017-04-07 DIAGNOSIS — D044 Carcinoma in situ of skin of scalp and neck: Secondary | ICD-10-CM | POA: Diagnosis not present

## 2017-04-07 DIAGNOSIS — D0462 Carcinoma in situ of skin of left upper limb, including shoulder: Secondary | ICD-10-CM | POA: Diagnosis not present

## 2017-04-14 ENCOUNTER — Other Ambulatory Visit: Payer: Self-pay | Admitting: Family Medicine

## 2017-04-14 NOTE — Telephone Encounter (Signed)
Last seen 03/26/17  Dr Laurance Flatten  If approved route to nurse to call into [facility]  [phone]

## 2017-04-15 ENCOUNTER — Ambulatory Visit: Payer: Medicare Other | Admitting: Cardiology

## 2017-04-22 ENCOUNTER — Other Ambulatory Visit: Payer: Self-pay | Admitting: Family Medicine

## 2017-05-11 DIAGNOSIS — M5136 Other intervertebral disc degeneration, lumbar region: Secondary | ICD-10-CM | POA: Diagnosis not present

## 2017-05-11 DIAGNOSIS — Z720 Tobacco use: Secondary | ICD-10-CM | POA: Diagnosis not present

## 2017-05-11 DIAGNOSIS — G894 Chronic pain syndrome: Secondary | ICD-10-CM | POA: Diagnosis not present

## 2017-05-14 ENCOUNTER — Other Ambulatory Visit: Payer: Self-pay | Admitting: Family Medicine

## 2017-05-14 DIAGNOSIS — G459 Transient cerebral ischemic attack, unspecified: Secondary | ICD-10-CM

## 2017-05-14 DIAGNOSIS — I4891 Unspecified atrial fibrillation: Secondary | ICD-10-CM

## 2017-05-18 ENCOUNTER — Ambulatory Visit (INDEPENDENT_AMBULATORY_CARE_PROVIDER_SITE_OTHER): Payer: Medicare Other | Admitting: *Deleted

## 2017-05-18 DIAGNOSIS — Z23 Encounter for immunization: Secondary | ICD-10-CM | POA: Diagnosis not present

## 2017-05-18 DIAGNOSIS — Z0130 Encounter for examination of blood pressure without abnormal findings: Secondary | ICD-10-CM

## 2017-05-18 NOTE — Progress Notes (Signed)
Have patient check blood pressure at home twice daily over the next week and bring his readings for review with a repeat blood pressure here in 1 week.

## 2017-05-18 NOTE — Progress Notes (Signed)
Pt here for BP check after checking BP at [facility] today BP at [facility] was 78/37 per pt BP check here 115/63 P 63 Pt states he always has some lightheadedness with dizziness Today is no different Please review and advise

## 2017-05-24 ENCOUNTER — Ambulatory Visit: Payer: Medicare Other | Admitting: Neurology

## 2017-05-24 DIAGNOSIS — N3020 Other chronic cystitis without hematuria: Secondary | ICD-10-CM | POA: Diagnosis not present

## 2017-05-24 DIAGNOSIS — N499 Inflammatory disorder of unspecified male genital organ: Secondary | ICD-10-CM | POA: Diagnosis not present

## 2017-06-21 DIAGNOSIS — N3020 Other chronic cystitis without hematuria: Secondary | ICD-10-CM | POA: Diagnosis not present

## 2017-06-21 DIAGNOSIS — N499 Inflammatory disorder of unspecified male genital organ: Secondary | ICD-10-CM | POA: Diagnosis not present

## 2017-06-29 ENCOUNTER — Ambulatory Visit: Payer: Medicare Other | Admitting: Family Medicine

## 2017-07-03 ENCOUNTER — Other Ambulatory Visit: Payer: Self-pay | Admitting: Family Medicine

## 2017-07-14 ENCOUNTER — Other Ambulatory Visit: Payer: Self-pay | Admitting: Family

## 2017-07-14 ENCOUNTER — Ambulatory Visit: Payer: Medicare Other | Admitting: Family Medicine

## 2017-07-14 NOTE — Telephone Encounter (Signed)
Last filled 06/14/17, last seen 02/23/17

## 2017-08-02 ENCOUNTER — Ambulatory Visit (INDEPENDENT_AMBULATORY_CARE_PROVIDER_SITE_OTHER): Payer: Medicare Other | Admitting: Family Medicine

## 2017-08-02 ENCOUNTER — Ambulatory Visit (INDEPENDENT_AMBULATORY_CARE_PROVIDER_SITE_OTHER): Payer: Medicare Other

## 2017-08-02 ENCOUNTER — Encounter: Payer: Self-pay | Admitting: Family Medicine

## 2017-08-02 VITALS — BP 110/54 | HR 54 | Temp 98.1°F | Ht 71.0 in | Wt 202.0 lb

## 2017-08-02 DIAGNOSIS — I10 Essential (primary) hypertension: Secondary | ICD-10-CM

## 2017-08-02 DIAGNOSIS — E1140 Type 2 diabetes mellitus with diabetic neuropathy, unspecified: Secondary | ICD-10-CM | POA: Diagnosis not present

## 2017-08-02 DIAGNOSIS — R0602 Shortness of breath: Secondary | ICD-10-CM | POA: Diagnosis not present

## 2017-08-02 DIAGNOSIS — I861 Scrotal varices: Secondary | ICD-10-CM

## 2017-08-02 DIAGNOSIS — I70209 Unspecified atherosclerosis of native arteries of extremities, unspecified extremity: Secondary | ICD-10-CM

## 2017-08-02 DIAGNOSIS — N433 Hydrocele, unspecified: Secondary | ICD-10-CM

## 2017-08-02 DIAGNOSIS — N492 Inflammatory disorders of scrotum: Secondary | ICD-10-CM | POA: Diagnosis not present

## 2017-08-02 DIAGNOSIS — E1151 Type 2 diabetes mellitus with diabetic peripheral angiopathy without gangrene: Secondary | ICD-10-CM | POA: Diagnosis not present

## 2017-08-02 DIAGNOSIS — I700 Atherosclerosis of aorta: Secondary | ICD-10-CM | POA: Diagnosis not present

## 2017-08-02 DIAGNOSIS — I2510 Atherosclerotic heart disease of native coronary artery without angina pectoris: Secondary | ICD-10-CM | POA: Diagnosis not present

## 2017-08-02 DIAGNOSIS — E559 Vitamin D deficiency, unspecified: Secondary | ICD-10-CM | POA: Diagnosis not present

## 2017-08-02 DIAGNOSIS — E782 Mixed hyperlipidemia: Secondary | ICD-10-CM

## 2017-08-02 DIAGNOSIS — K219 Gastro-esophageal reflux disease without esophagitis: Secondary | ICD-10-CM

## 2017-08-02 LAB — URINALYSIS, COMPLETE
BILIRUBIN UA: NEGATIVE
GLUCOSE, UA: NEGATIVE
KETONES UA: NEGATIVE
NITRITE UA: NEGATIVE
RBC UA: NEGATIVE
SPEC GRAV UA: 1.02 (ref 1.005–1.030)
Urobilinogen, Ur: 1 mg/dL (ref 0.2–1.0)
pH, UA: 5.5 (ref 5.0–7.5)

## 2017-08-02 LAB — MICROSCOPIC EXAMINATION
Epithelial Cells (non renal): NONE SEEN /hpf (ref 0–10)
RBC MICROSCOPIC, UA: NONE SEEN /HPF (ref 0–?)
Renal Epithel, UA: NONE SEEN /hpf
WBC, UA: >30 /hpf — AB (ref 0–?)

## 2017-08-02 LAB — BAYER DCA HB A1C WAIVED: HB A1C: 5.4 % (ref <7.0)

## 2017-08-02 MED ORDER — LACTULOSE 10 GM/15ML PO SOLN: 10.0000 g | Freq: Every day | ORAL | 5 refills | Status: DC | PRN

## 2017-08-02 MED ORDER — DIAZEPAM 10 MG PO TABS: 10.0000 mg | ORAL_TABLET | Freq: Two times a day (BID) | ORAL | 5 refills | Status: DC | PRN

## 2017-08-02 MED ORDER — SULFAMETHOXAZOLE-TRIMETHOPRIM 800-160 MG PO TABS: 1.0000 | ORAL_TABLET | Freq: Two times a day (BID) | ORAL | 1 refills | Status: DC

## 2017-08-02 NOTE — Addendum Note (Signed)
Addended by: Zannie Cove on: 08/02/2017 05:14 PM   Modules accepted: Orders

## 2017-08-02 NOTE — Patient Instructions (Addendum)
Medicare Annual Wellness Visit  [facility] and the medical providers at [facility] strive to bring you the best medical care.  In doing so we not only want to address your current medical conditions and concerns but also to detect new conditions early and prevent illness, disease and health-related problems.    Medicare offers a yearly Wellness Visit which allows our clinical staff to assess your need for preventative services including immunizations, lifestyle education, counseling to decrease risk of preventable diseases and screening for fall risk and other medical concerns.    This visit is provided free of charge (no copay) for all Medicare recipients. The clinical pharmacists at [facility] have begun to conduct these Wellness Visits which will also include a thorough review of all your medications.    As you primary medical provider recommend that you make an appointment for your Annual Wellness Visit if you have not done so already this year.  You may set up this appointment before you leave today or you may call back ([phone]) and schedule an appointment.  Please make sure when you call that you mention that you are scheduling your Annual Wellness Visit with the clinical pharmacist so that the appointment may be made for the proper length of time.     Continue current medications. Continue good therapeutic lifestyle changes which include good diet and exercise. Fall precautions discussed with patient. If an FOBT was given today- please return it to our front desk. If you are over 61 years old - you may need Prevnar 7 or the adult Pneumonia vaccine.  **Flu shots are available--- please call and schedule a FLU-CLINIC appointment**  After your visit with [place] today you will receive a survey in the mail or online from [employer] regarding your care with [place]. Please take a moment to fill this out. Your feedback is very  important to [place] as you can help [place] better understand your patient needs as well as improve your experience and satisfaction. WE CARE ABOUT YOU!!!   We will call you with the results of the chest x-ray ray once those results are returned We will arrange for you to see Dr. Domenic Polite in [place] who is the cardiologist for follow-up We have arranged for you to see Dr. Karsten Ro, the urologist because of the ongoing problems with a scrotal abscess and swelling in the left testicle. Stay on the antibiotics until you see him of taking Septra DS twice daily.  He can make further adjustments in this medication if he deems necessary.

## 2017-08-02 NOTE — Progress Notes (Signed)
Subjective:    Patient ID: Dustin Dominguez, male    DOB: 01/30/1941, 77 y.o.   MRN: [id number]  HPI  Pt here for follow up and management of chronic medical problems which includes diabetes, hypertension and hyperlipidemia. He is taking medication regularly.  This patient has had ongoing problems with scrotal cellulitis that does not seem to be healing.  He was recently in the emergency room in September because of additional swelling and drainage and had ultrasounds done of the scrotum and pelvis which showed cellulitis.  He is also complaining of some shortness of breath which seems to be worse at nighttime.  He will get lab work today.  His pulse ox today was 97%.  He is diabetic and his vital signs are stable other than his weight being up 7 more pounds from the previous weight that was obtained.  The patient is a good historian.  He continues to have problems off and on with this testicular drainage and swelling.  He is also had worsening shortness of breath at nighttime.  He has a left hydrocele and has had problems with cellulitis on the right scrotum.  He takes sulfa for a month at a time and this gets better and then after a few weeks the drainage comes back and he has more problems.  He has seen the urologist and they discussed doing an orchiectomy and he is more serious about getting this done than ever before because of the recurrent problems and pain that he has when he gets these infections.  He is not seen the urologist in a while.  He is also not seen the cardiologist and he needs an appointment for cardiology follow-up.  He denies any chest pain or either is only occasional.  And no more than usual.  He does have more shortness of breath which is been worse at nighttime and he has used an inhaler with help for this.  This is been going on for about 6 months.  He denies any trouble with blood in the stool or black tarry bowel movements but does have alternating constipation and diarrhea.  He  says that when he is more constipated this seems to flare up his cellulitis in the scrotum.  He does not check his blood sugars regularly because generally his blood sugars are under good control.  He did have an eye exam in the past 5-6 months at my eye doctor.  His only urinary tract symptoms are frequency.     Patient Active Problem List   Diagnosis Date Noted  . Abdominal aortic atherosclerosis ([place]) 08/04/2016  . Protein-calorie malnutrition, severe 07/09/2016  . SBO (small bowel obstruction) ([place])   . Malnutrition of moderate degree 07/05/2016  . Abdominal pain 07/04/2016  . Scrotal abscess 07/03/2016  . Cellulitis of scrotum 07/03/2016  . Coronary artery disease involving native coronary artery of native heart with angina pectoris ([place])   . Leukocytosis 07/06/2015  . GERD (gastroesophageal reflux disease) 06/13/2013  . Vitamin D deficiency 06/13/2013  . Degenerative disc disease, cervical 06/13/2013  . Degenerative disc disease, lumbar 06/13/2013  . Multiple thyroid nodules 04/07/2012  . Mixed hyperlipidemia 01/27/2011  . Coronary atherosclerosis of native coronary artery   . Sleep apnea   . Tobacco use disorder   . Essential hypertension, benign   . 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 amLODipine (NORVASC) 10 MG  tablet TAKE ONE TABLET BY MOUTH EVERY DAY  . atorvastatin (LIPITOR) 20 MG tablet TAKE 1 TABLET BY MOUTH EVERY DAY  . diazepam (VALIUM) 10 MG tablet TAKE ONE TABLET BY MOUTH TWICE DAILY AS NEEDED  . ELIQUIS 5 MG TABS tablet TAKE 1 TABLET BY MOUTH TWICE DAILY  . fenofibrate 160 MG tablet TAKE 1 TABLET BY MOUTH EVERY DAY  . fish oil-omega-3 fatty acids 1000 MG capsule Take 1 g by mouth daily.   Marland Kitchen glimepiride (AMARYL) 2 MG tablet TAKE 1 TABLET BY MOUTH EVERY DAY DO not fill UNTIL asked  . hydrochlorothiazide (MICROZIDE) 12.5 MG capsule TAKE 1 CAPSULE BY  MOUTH EVERY DAY  . isosorbide mononitrate (IMDUR) 30 MG 24 hr tablet TAKE ONE TABLET BY MOUTH EVERY DAY  . lactulose (CHRONULAC) 10 GM/15ML solution Take 15 mLs (10 g total) by mouth daily as needed for mild constipation, moderate constipation or severe constipation. Reported on 11/11/2015  . metoprolol tartrate (LOPRESSOR) 50 MG tablet TAKE 1/2 TABLET BY MOUTH TWICE DAILY  . morphine (MS CONTIN) 30 MG 12 hr tablet Take 30 mg by mouth 3 (three) times daily.  . naproxen (NAPROSYN) 500 MG tablet Take 1 tablet (500 mg total) by mouth 2 (two) times daily with a meal.  . oxyCODONE-acetaminophen (PERCOCET) 10-325 MG per tablet Take 1 tablet by mouth 6 (six) times daily.   . pantoprazole (PROTONIX) 40 MG tablet TAKE ONE TABLET BY MOUTH TWICE DAILY  . quinapril (ACCUPRIL) 20 MG tablet TAKE ONE TABLET BY MOUTH EVERY DAY  . senna-docusate (SENOKOT S) 8.6-50 MG tablet Take 1 tablet by mouth as needed for mild constipation.  . sucralfate (CARAFATE) 1 g tablet Take 1 tablet (1 g total) by mouth 4 (four) times daily -  with meals and at bedtime.  . Vitamin D, Ergocalciferol, (DRISDOL) 50000 units CAPS capsule TAKE ONE CAPSULE BY MOUTH ONCE WEEKLY   No facility-administered encounter medications on file as of 08/02/2017.      Review of Systems  Constitutional: Negative.   HENT: Negative.   Eyes: Negative.   Respiratory: Positive for shortness of breath (worse at night ).   Cardiovascular: Negative.   Gastrointestinal: Negative.   Endocrine: Negative.   Genitourinary: Positive for testicular pain (left - not healing well ).  Musculoskeletal: Negative.   Skin: Negative.   Allergic/Immunologic: Negative.   Neurological: Negative.   Hematological: Negative.   Psychiatric/Behavioral: Negative.        Objective:   Physical Exam  Constitutional: He is oriented to person, place, and time. He appears well-developed and well-nourished. No distress.  The patient is pleasant and alert and in no acute  distress.  HENT:  Head: Normocephalic and atraumatic.  Right Ear: External ear normal.  Left Ear: External ear normal.  Nose: Nose normal.  Mouth/Throat: Oropharynx is clear and moist. No oropharyngeal exudate.  Eyes: Conjunctivae and EOM are normal. Pupils are equal, round, and reactive to light. Right eye exhibits no discharge. Left eye exhibits no discharge. No scleral icterus.  Up-to-date on eye exams.  Neck: Normal range of motion. Neck supple. No thyromegaly present.  No bruits thyromegaly or anterior cervical adenopathy  Cardiovascular: Normal rate, regular rhythm, normal heart sounds and intact distal pulses.  No murmur heard. Heart has a regular rate and rhythm at 72/min  Pulmonary/Chest: Effort normal and breath sounds normal. No respiratory distress. He has no wheezes. He has no rales. He exhibits no tenderness.  Rare wheeze otherwise clear anteriorly and posteriorly and no  rales.  Abdominal: Soft. Bowel sounds are normal. He exhibits no mass. There is no tenderness. There is no rebound and no guarding.  Multiple scars on abdomen with protuberance greater on the right than the left.  No tenderness and no inguinal adenopathy  Genitourinary:  Genitourinary Comments: The patient has a large left varicocele and apparently bilateral hydroceles.  There was no drainage present today from beneath the scrotum.  Both testicles were very tender to palpation.  Musculoskeletal: Normal range of motion. He exhibits no edema.  Lymphadenopathy:    He has no cervical adenopathy.  Neurological: He is alert and oriented to person, place, and time. He has normal reflexes. No cranial nerve deficit.  Skin: Skin is warm and dry. No rash noted.  No redness or drainage from scrotal areas.  Psychiatric: He has a normal mood and affect. His behavior is normal. Judgment and thought content normal.  Nursing note and vitals reviewed.  BP (!) 110/54 (BP Location: Left Arm)   Pulse (!) 54   Temp 98.1 F  (36.7 C) (Oral)   Ht '5\' 11"'$  (1.803 m)   Wt 202 lb (91.6 kg)   SpO2 97% Comment: room air  BMI 28.17 kg/m   Chest x-ray with results pending=== Pulse ox today in the office 97%      Assessment & Plan:  1. Type 2 diabetes mellitus with diabetic neuropathy, without long-term current use of insulin ([place]) -Lab work today and patient will continue with current treatment pending results of lab work - CBC with Differential/Platelet - [facility] [place] Hb A1c Waived  2. ASCVD (arteriosclerotic cardiovascular disease) -Follow-up with cardiology and reason for increased shortness of breath plan tomorrow. - CBC with Differential/Platelet - Lipid panel - DG Chest 2 View; Future  3. Mixed hyperlipidemia -Continue with current treatment and aggressive therapeutic lifestyle changes pending results of lab work - CBC with Differential/Platelet - Lipid panel - DG Chest 2 View; Future  4. Essential hypertension, benign -Blood pressure is good today and he will continue with current treatment - BMP8+EGFR - CBC with Differential/Platelet - Hepatic function panel - DG Chest 2 View; Future  5. Vitamin D deficiency -Continue with current treatment pending results of lab work - CBC with Differential/Platelet - VITAMIN D 25 Hydroxy (Vit-D Deficiency, Fractures)  6. Gastroesophageal reflux disease, esophagitis presence not specified -No complaints with reflux today. - CBC with Differential/Platelet - Hepatic function panel  7. Abdominal aortic atherosclerosis ([place]) -Continue with current treatment and aggressive therapeutic lifestyle changes  8. 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 Cellulitis of scrotum -Follow-up with urology next week, appointment scheduled and patient will stay on Septra DS twice daily at least until seeing the  urologist. - Brain natriuretic peptide - Urinalysis, Complete  10. SOB (shortness of breath) -BNP and chest x-ray today with follow-up by cardiology tomorrow - DG Chest 2 View; Future  11. Right hydrocele -Follow-up with urology as planned  12. Bilateral varicoceles -Follow-up with urology as planned  Meds ordered this encounter  Medications  . lactulose (CHRONULAC) 10 GM/15ML solution    Sig: Take 15 mLs (10 g total) by mouth daily as needed for mild constipation, moderate constipation or severe constipation. Reported on 11/11/2015    Dispense:  240 mL    Refill:  5  . diazepam (VALIUM) 10 MG tablet  Sig: Take 1 tablet (10 mg total) by mouth 2 (two) times daily as needed.    Dispense:  60 tablet    Refill:  5    This prescription was filled on 06/24/2017. Any refills authorized will be placed on file.  . sulfamethoxazole-trimethoprim (BACTRIM DS) 800-160 MG tablet    Sig: Take 1 tablet by mouth 2 (two) times daily.    Dispense:  60 tablet    Refill:  1   Patient Instructions                       Medicare Annual Wellness Visit   and the medical providers at [facility] strive to bring you the best medical care.  In doing so we not only want to address your current medical conditions and concerns but also to detect new conditions early and prevent illness, disease and health-related problems.    Medicare offers a yearly Wellness Visit which allows our clinical staff to assess your need for preventative services including immunizations, lifestyle education, counseling to decrease risk of preventable diseases and screening for fall risk and other medical concerns.    This visit is provided free of charge (no copay) for all Medicare recipients. The clinical pharmacists at [facility] have begun to conduct these Wellness Visits which will also include a thorough review of all your medications.    As you primary medical  provider recommend that you make an appointment for your Annual Wellness Visit if you have not done so already this year.  You may set up this appointment before you leave today or you may call back ([phone]) and schedule an appointment.  Please make sure when you call that you mention that you are scheduling your Annual Wellness Visit with the clinical pharmacist so that the appointment may be made for the proper length of time.     Continue current medications. Continue good therapeutic lifestyle changes which include good diet and exercise. Fall precautions discussed with patient. If an FOBT was given today- please return it to our front desk. If you are over 93 years old - you may need Prevnar 67 or the adult Pneumonia vaccine.  **Flu shots are available--- please call and schedule a FLU-CLINIC appointment**  After your visit with [place] today you will receive a survey in the mail or online from [employer] regarding your care with [place]. Please take a moment to fill this out. Your feedback is very important to [place] as you can help [place] better understand your patient needs as well as improve your experience and satisfaction. WE CARE ABOUT YOU!!!   We will call you with the results of the chest x-ray ray once those results are returned We will arrange for you to see Dr. Domenic Polite in [place] who is the cardiologist for follow-up We have arranged for you to see Dr. Karsten Ro, the urologist because of the ongoing problems with a scrotal abscess and swelling in the left testicle. Stay on the antibiotics until you see him of taking Septra DS twice daily.  He can make further adjustments in this medication if he deems necessary.   Arrie Senate MD

## 2017-08-03 ENCOUNTER — Encounter: Payer: Self-pay | Admitting: Cardiology

## 2017-08-03 ENCOUNTER — Telehealth: Payer: Self-pay | Admitting: *Deleted

## 2017-08-03 ENCOUNTER — Ambulatory Visit (INDEPENDENT_AMBULATORY_CARE_PROVIDER_SITE_OTHER): Payer: Medicare Other | Admitting: Cardiology

## 2017-08-03 VITALS — BP 122/60 | HR 57 | Ht 64.0 in | Wt 205.0 lb

## 2017-08-03 DIAGNOSIS — I484 Atypical atrial flutter: Secondary | ICD-10-CM

## 2017-08-03 DIAGNOSIS — E782 Mixed hyperlipidemia: Secondary | ICD-10-CM | POA: Diagnosis not present

## 2017-08-03 DIAGNOSIS — I10 Essential (primary) hypertension: Secondary | ICD-10-CM | POA: Diagnosis not present

## 2017-08-03 DIAGNOSIS — R0602 Shortness of breath: Secondary | ICD-10-CM | POA: Diagnosis not present

## 2017-08-03 LAB — HEPATIC FUNCTION PANEL
ALK PHOS: 39 IU/L (ref 39–117)
ALT: 17 IU/L (ref 0–44)
AST: 28 IU/L (ref 0–40)
Albumin: 3.9 g/dL (ref 3.5–4.8)
Bilirubin Total: 0.3 mg/dL (ref 0.0–1.2)
Bilirubin, Direct: 0.13 mg/dL (ref 0.00–0.40)
TOTAL PROTEIN: 6.3 g/dL (ref 6.0–8.5)

## 2017-08-03 LAB — LIPID PANEL
CHOL/HDL RATIO: 4.7 ratio (ref 0.0–5.0)
Cholesterol, Total: 136 mg/dL (ref 100–199)
HDL: 29 mg/dL — ABNORMAL LOW (ref >39)
LDL CALC: 70 mg/dL (ref 0–99)
Triglycerides: 184 mg/dL — ABNORMAL HIGH (ref 0–149)
VLDL CHOLESTEROL CAL: 37 mg/dL (ref 5–40)

## 2017-08-03 LAB — BMP8+EGFR
BUN / CREAT RATIO: 18 (ref 10–24)
BUN: 16 mg/dL (ref 8–27)
CALCIUM: 9 mg/dL (ref 8.6–10.2)
CO2: 26 mmol/L (ref 20–29)
CREATININE: 0.91 mg/dL (ref 0.76–1.27)
Chloride: 101 mmol/L (ref 96–106)
GFR, EST AFRICAN AMERICAN: 96 mL/min/{1.73_m2} (ref >59)
GFR, EST NON AFRICAN AMERICAN: 83 mL/min/{1.73_m2} (ref >59)
Glucose: 70 mg/dL (ref 65–99)
Potassium: 4.5 mmol/L (ref 3.5–5.2)
Sodium: 142 mmol/L (ref 134–144)

## 2017-08-03 LAB — CBC WITH DIFFERENTIAL/PLATELET
BASOS: 1 %
Basophils Absolute: 0.1 10*3/uL (ref 0.0–0.2)
EOS (ABSOLUTE): 0.6 10*3/uL — ABNORMAL HIGH (ref 0.0–0.4)
EOS: 5 %
HEMATOCRIT: 39.1 % (ref 37.5–51.0)
HEMOGLOBIN: 13.1 g/dL (ref 13.0–17.7)
IMMATURE GRANS (ABS): 0 10*3/uL (ref 0.0–0.1)
Immature Granulocytes: 0 %
LYMPHS ABS: 5.7 10*3/uL — AB (ref 0.7–3.1)
LYMPHS: 46 %
MCH: 30.3 pg (ref 26.6–33.0)
MCHC: 33.5 g/dL (ref 31.5–35.7)
MCV: 90 fL (ref 79–97)
MONOCYTES: 6 %
Monocytes Absolute: 0.8 10*3/uL (ref 0.1–0.9)
NEUTROS ABS: 5.3 10*3/uL (ref 1.4–7.0)
Neutrophils: 42 %
Platelets: 332 10*3/uL (ref 150–379)
RBC: 4.33 x10E6/uL (ref 4.14–5.80)
RDW: 13.5 % (ref 12.3–15.4)
WBC: 12.5 10*3/uL — ABNORMAL HIGH (ref 3.4–10.8)

## 2017-08-03 LAB — VITAMIN D 25 HYDROXY (VIT D DEFICIENCY, FRACTURES): Vit D, 25-Hydroxy: 34.3 ng/mL (ref 30.0–100.0)

## 2017-08-03 LAB — BRAIN NATRIURETIC PEPTIDE: BNP: 107.1 pg/mL — AB (ref 0.0–100.0)

## 2017-08-03 NOTE — Telephone Encounter (Signed)
Echo scheduled for 08/11/17 at 12:00 noon [facility] dx: SOB
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
## 2017-08-03 NOTE — Patient Instructions (Signed)
Medication Instructions:  Your physician recommends that you continue on your current medications as directed. Please refer to the Current Medication list given to you today.  Labwork: NONE  Testing/Procedures: Your physician has requested that you have an echocardiogram. Echocardiography is a painless test that uses sound waves to create images of your heart. It provides your doctor with information about the size and shape of your heart and how well your heart's chambers and valves are working. This procedure takes approximately one hour. There are no restrictions for this procedure.  Follow-Up: Your physician recommends that you schedule a follow-up appointment in: 3 MONTHS WITH DR. MCDOWELL  Any Other Special Instructions Will Be Listed Below (If Applicable).  If you need a refill on your cardiac medications before your next appointment, please call your pharmacy. 

## 2017-08-04 LAB — URINE CULTURE

## 2017-08-05 ENCOUNTER — Other Ambulatory Visit: Payer: Self-pay

## 2017-08-05 MED ORDER — CEFDINIR 300 MG PO CAPS
300.0000 mg | ORAL_CAPSULE | Freq: Two times a day (BID) | ORAL | 0 refills | Status: DC
Start: 2017-08-05 — End: 2017-11-30

## 2017-08-11 ENCOUNTER — Other Ambulatory Visit: Payer: Self-pay

## 2017-08-11 ENCOUNTER — Ambulatory Visit (INDEPENDENT_AMBULATORY_CARE_PROVIDER_SITE_OTHER): Payer: Medicare Other

## 2017-08-11 DIAGNOSIS — R0602 Shortness of breath: Secondary | ICD-10-CM

## 2017-08-12 MED ORDER — FUROSEMIDE 20 MG PO TABS: 20.0000 mg | ORAL_TABLET | Freq: Every day | ORAL | 0 refills | Status: DC

## 2017-08-12 MED ORDER — POTASSIUM CHLORIDE ER 10 MEQ PO TBCR: 10.0000 meq | EXTENDED_RELEASE_TABLET | Freq: Every day | ORAL | 0 refills | Status: DC

## 2017-08-12 NOTE — Telephone Encounter (Signed)
Patient notified. Routed to PCP. New rx sent to pharmacy

## 2017-08-12 NOTE — Telephone Encounter (Signed)
-----   Message from Acquanetta Chain, LPN sent at 10/20/7094  8:13 AM EST -----   ----- Message ----- From: Satira Sark, MD Sent: 08/11/2017   7:31 PM To: Merlene Laughter, LPN, Chipper Herb, MD  Results reviewed. Overall reassuring in that LVEF is normal at 60-65%. He does have mild diastolic dysfunction. Since we were concerned about some element of fluid overload at his recent office visit, let's switch from HCTZ to Lasix 20 mg daily along with KCl 10 mEq daily. Check BMET in a few weeks. A copy of this test should be forwarded to Chipper Herb, MD.

## 2017-08-15 DIAGNOSIS — D044 Carcinoma in situ of skin of scalp and neck: Secondary | ICD-10-CM | POA: Diagnosis not present

## 2017-08-15 DIAGNOSIS — Z85828 Personal history of other malignant neoplasm of skin: Secondary | ICD-10-CM | POA: Diagnosis not present

## 2017-08-15 DIAGNOSIS — D0439 Carcinoma in situ of skin of other parts of face: Secondary | ICD-10-CM | POA: Diagnosis not present

## 2017-08-15 DIAGNOSIS — D0462 Carcinoma in situ of skin of left upper limb, including shoulder: Secondary | ICD-10-CM | POA: Diagnosis not present

## 2017-08-15 DIAGNOSIS — L570 Actinic keratosis: Secondary | ICD-10-CM | POA: Diagnosis not present

## 2017-08-15 DIAGNOSIS — D485 Neoplasm of uncertain behavior of skin: Secondary | ICD-10-CM | POA: Diagnosis not present

## 2017-08-16 DIAGNOSIS — N499 Inflammatory disorder of unspecified male genital organ: Secondary | ICD-10-CM | POA: Diagnosis not present

## 2017-08-19 ENCOUNTER — Telehealth: Payer: Self-pay | Admitting: Cardiology

## 2017-08-19 NOTE — Telephone Encounter (Signed)
LMTCB

## 2017-08-19 NOTE — Telephone Encounter (Signed)
Patient called stating that he cannot take Lasix.

## 2017-08-19 NOTE — Telephone Encounter (Signed)
Stated that he can not take the Lasix.  Was changed on 08/12/2017 from the HCTZ.  Stated that he already has frequent urination & this medication makes it a lot worse.  Could he be changed to anything different?

## 2017-08-22 NOTE — Telephone Encounter (Signed)
Patient notified.  Stated that he has been trying the Lasix at bedtime the last few nights & has done better for him.  Stated that he will try doing this at bedtime a few more days to see how it goes.  Will call back if decides he would like to change back to the HCTZ.

## 2017-08-22 NOTE — Telephone Encounter (Signed)
If he does not want to continue on the Lasix, switch him back to HCTZ although at higher dose, 25 mg daily.

## 2017-09-12 ENCOUNTER — Other Ambulatory Visit: Payer: Self-pay | Admitting: Family Medicine

## 2017-09-12 DIAGNOSIS — I4891 Unspecified atrial fibrillation: Secondary | ICD-10-CM

## 2017-09-12 DIAGNOSIS — G459 Transient cerebral ischemic attack, unspecified: Secondary | ICD-10-CM

## 2017-09-14 DIAGNOSIS — M545 Low back pain: Secondary | ICD-10-CM | POA: Diagnosis not present

## 2017-09-14 DIAGNOSIS — Z79891 Long term (current) use of opiate analgesic: Secondary | ICD-10-CM | POA: Diagnosis not present

## 2017-09-23 IMAGING — DX DG ABDOMEN 2V
5 series · 5 of 5 positions shown · non-contrast
Comparison: 07/10/2016, 07/09/2016

CLINICAL DATA: Small bowel obstruction

EXAM:
ABDOMEN - 2 VIEW

[abdomen erect (1 of 2)]
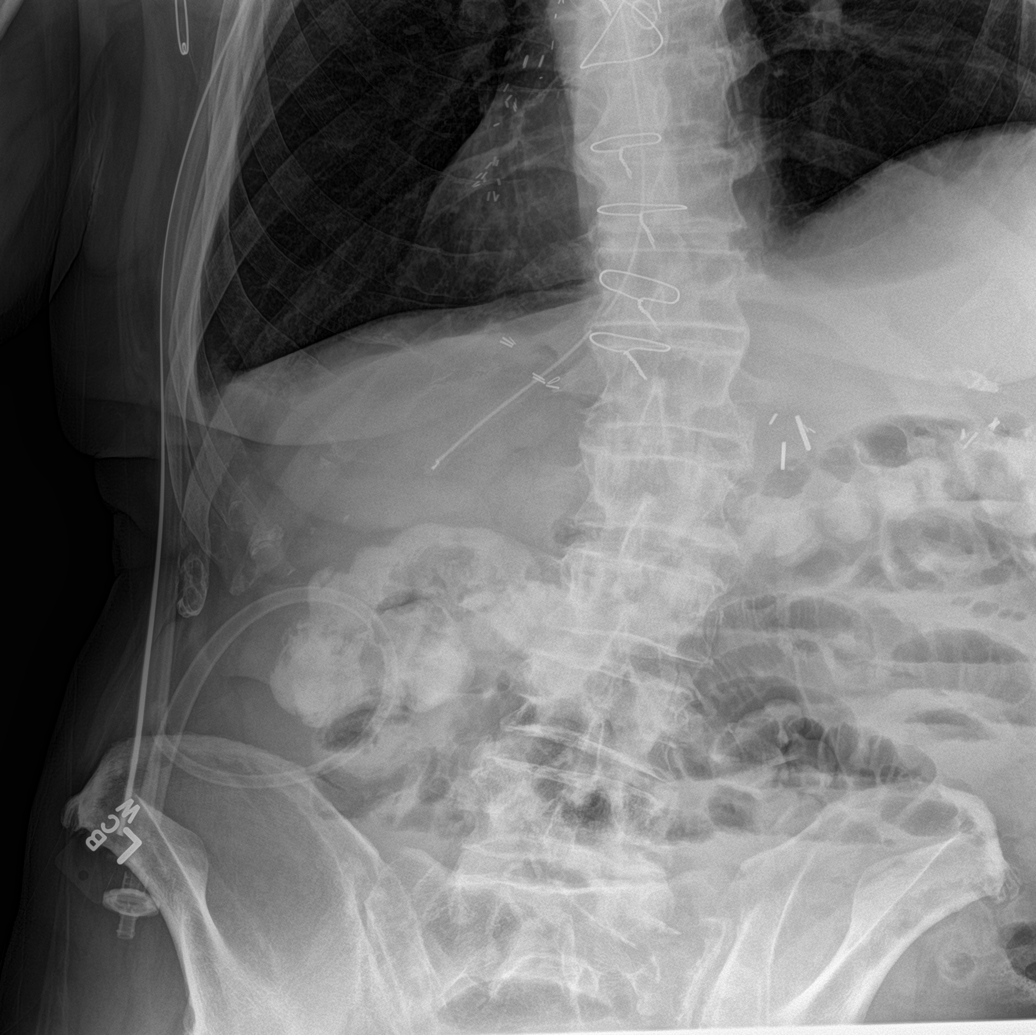

[abdomen supine (1 of 3)]
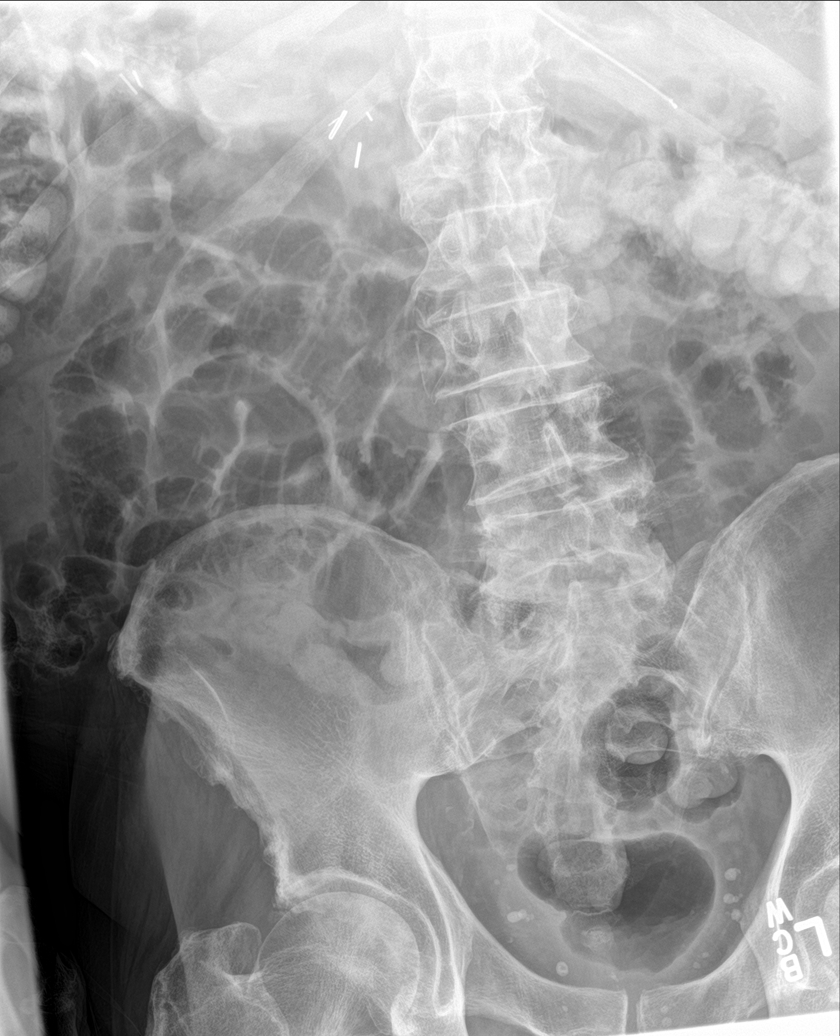

[abdomen erect (2 of 2)]
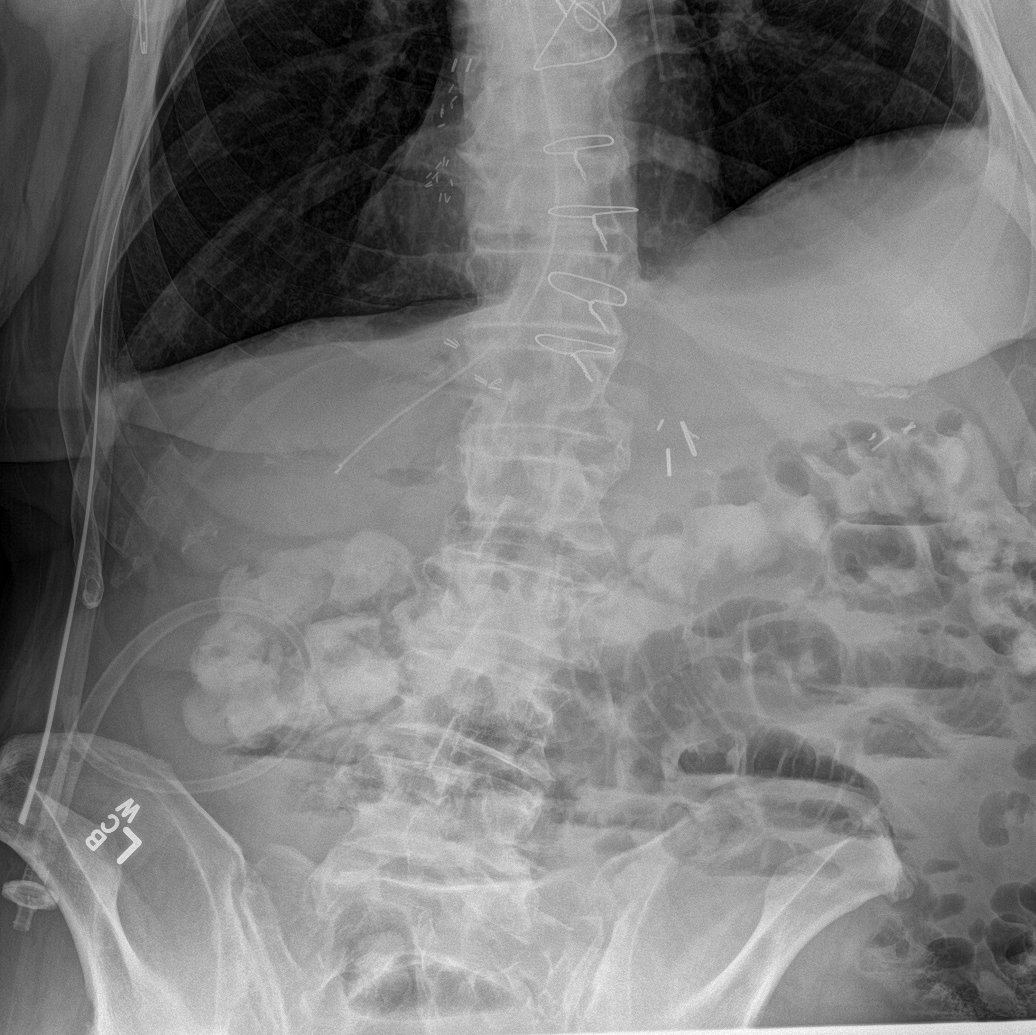

[abdomen supine (2 of 3)]
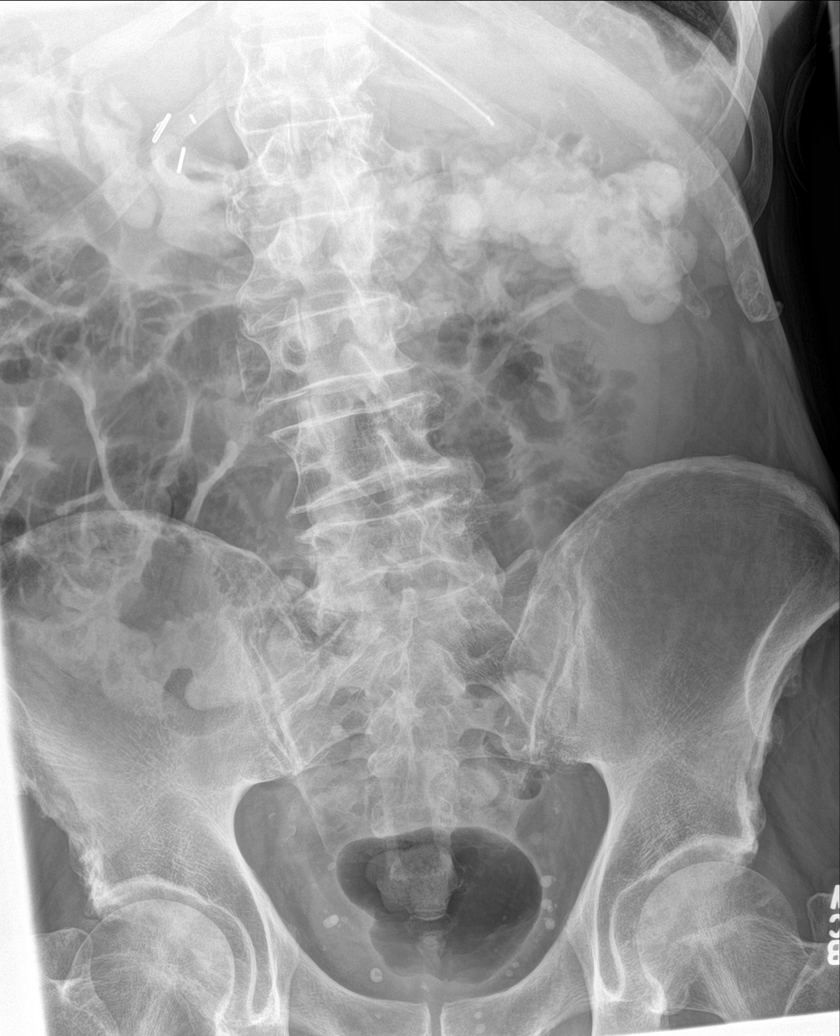

[abdomen supine (3 of 3)]
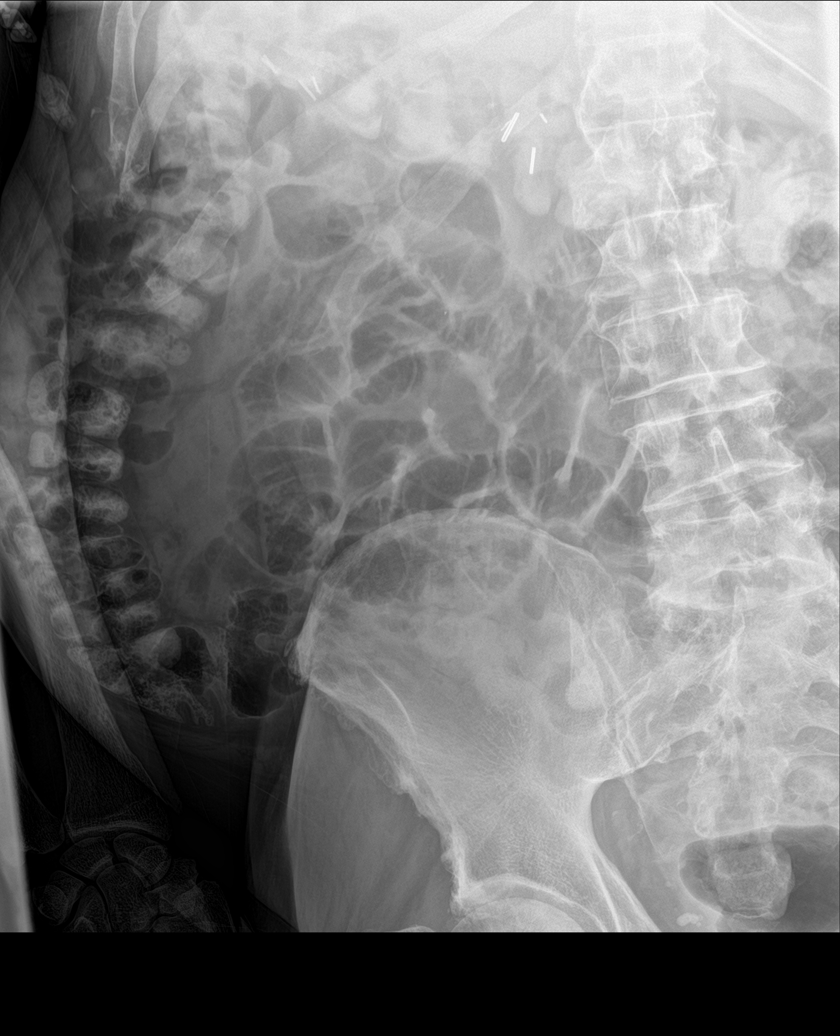

[5 of 5 positions shown; findings below may reference images not displayed]

FINDINGS: Several loops of dilated small bowel again noted without significant
interval change. There is air and contrast within the colon. Air in
the rectum noted. Upright exam demonstrates scattered air-fluid
levels in the small bowel and colon. Appearance compatible with
residual persistent partial small bowel obstruction. No free air
evident.
IMPRESSION: Persistent small bowel dilatation and air-fluid levels but air and
contrast within the colon compatible with persistent partial
obstruction pattern.

## 2017-09-24 IMAGING — DX DG ABD PORTABLE 1V
2 series · 2 of 2 positions shown · non-contrast
Comparison: 07/11/2016

CLINICAL DATA: Small bowel obstruction, followup

EXAM:
PORTABLE ABDOMEN - 1 VIEW

[abdomen kub (1 of 2)]
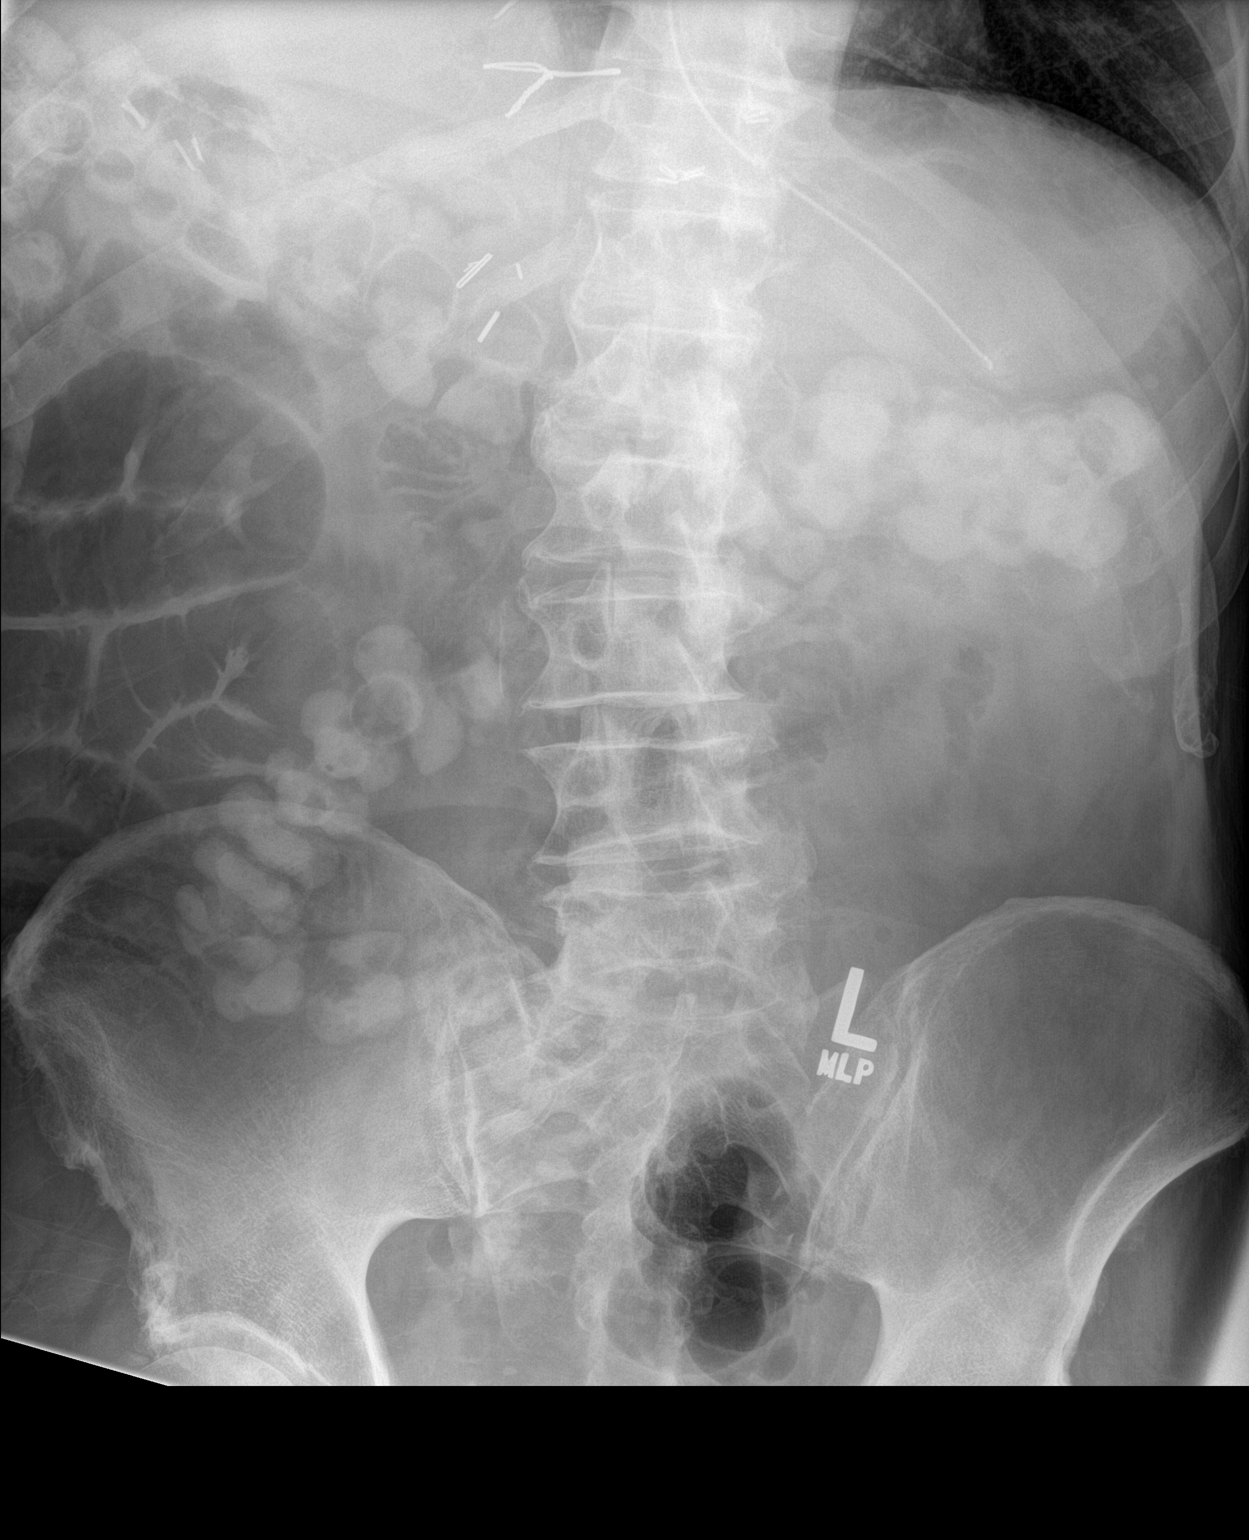

[abdomen kub (2 of 2)]
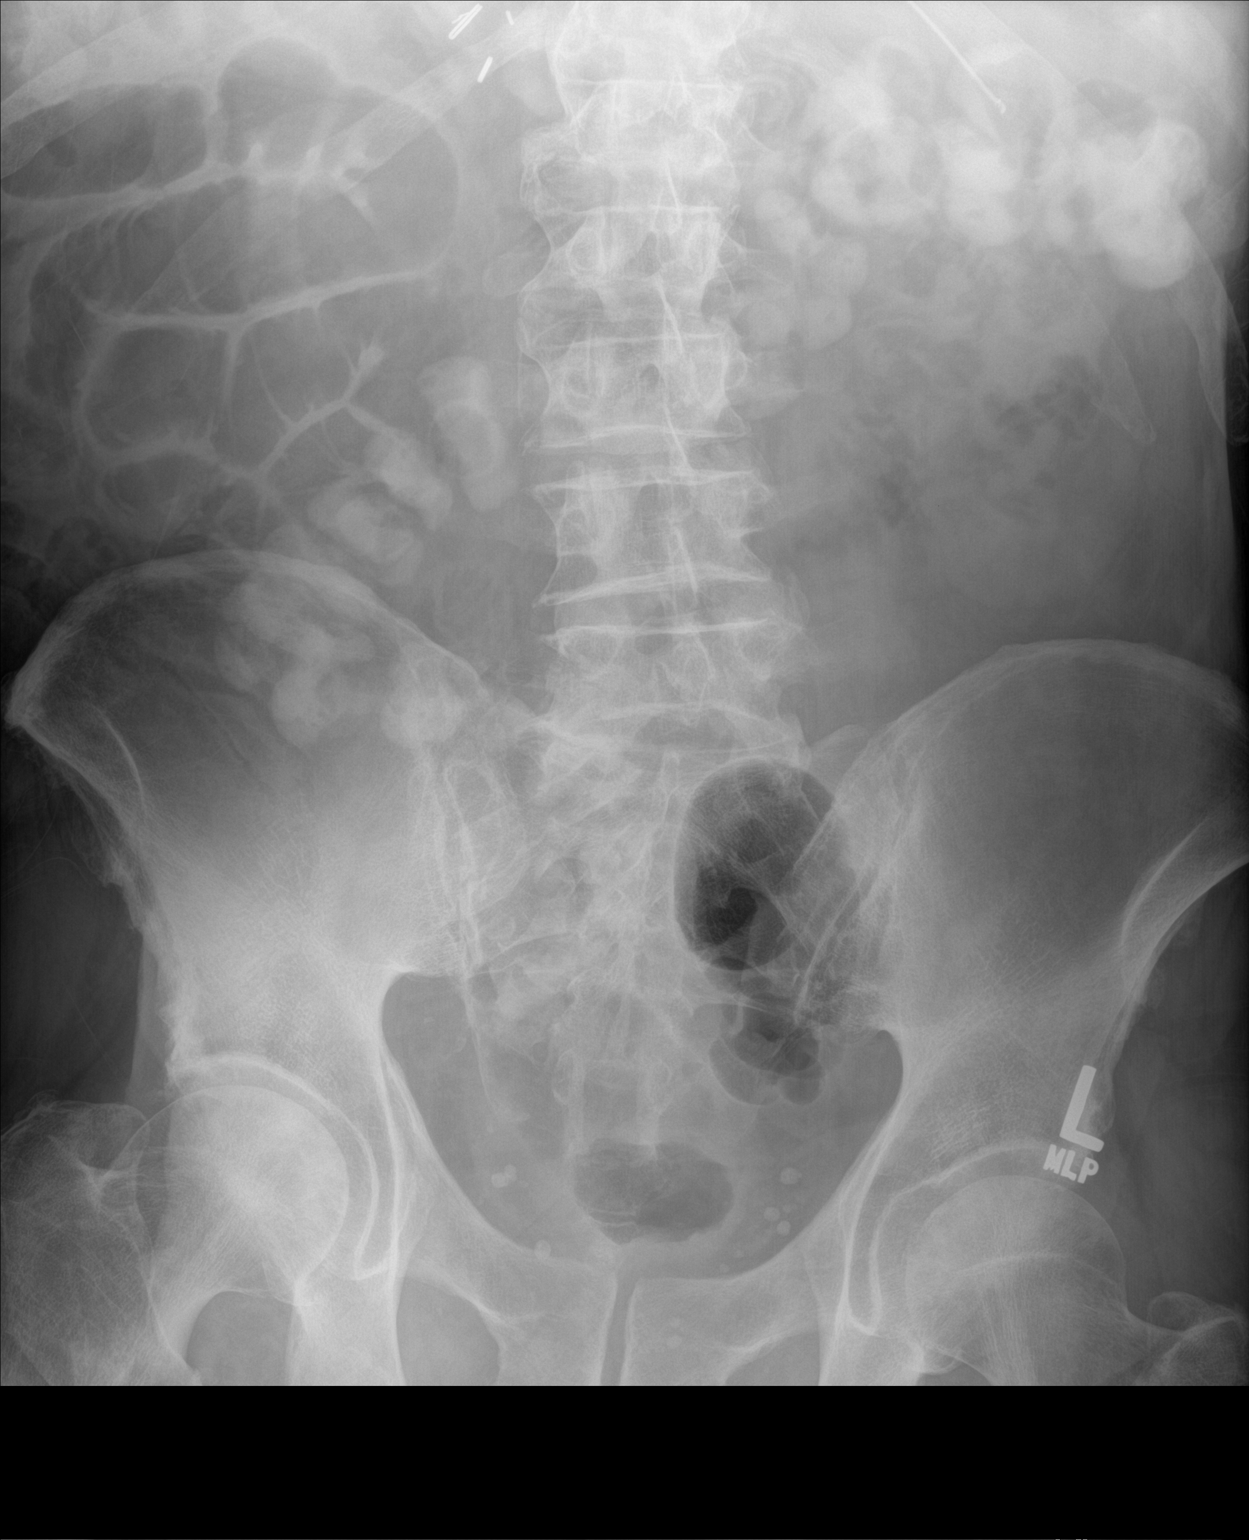

[2 of 2 positions shown; findings below may reference images not displayed]

FINDINGS: Nasogastric tube projects over stomach.

Retained contrast in colon.

Air-filled loops of small bowel in the RIGHT mid abdomen slightly
decreased in caliber since previous study.

No bowel wall thickening or free intraperitoneal air.

Bones demineralized.

Surgical clips RIGHT upper quadrant question cholecystectomy.
IMPRESSION: Decreased small bowel distention since previous study.

## 2017-10-12 ENCOUNTER — Other Ambulatory Visit: Payer: Self-pay | Admitting: Family

## 2017-10-12 ENCOUNTER — Other Ambulatory Visit: Payer: Self-pay | Admitting: Cardiology

## 2017-10-12 ENCOUNTER — Other Ambulatory Visit: Payer: Self-pay | Admitting: Family Medicine

## 2017-10-13 ENCOUNTER — Other Ambulatory Visit: Payer: Self-pay | Admitting: *Deleted

## 2017-10-13 DIAGNOSIS — C4442 Squamous cell carcinoma of skin of scalp and neck: Secondary | ICD-10-CM | POA: Diagnosis not present

## 2017-10-13 DIAGNOSIS — C44329 Squamous cell carcinoma of skin of other parts of face: Secondary | ICD-10-CM | POA: Diagnosis not present

## 2017-10-13 DIAGNOSIS — C44629 Squamous cell carcinoma of skin of left upper limb, including shoulder: Secondary | ICD-10-CM | POA: Diagnosis not present

## 2017-10-13 MED ORDER — ATORVASTATIN CALCIUM 20 MG PO TABS: 20.0000 mg | ORAL_TABLET | Freq: Every day | ORAL | 2 refills | Status: DC

## 2017-10-13 MED ORDER — GLIMEPIRIDE 2 MG PO TABS: ORAL_TABLET | ORAL | 0 refills | Status: DC

## 2017-10-13 MED ORDER — ATORVASTATIN CALCIUM 20 MG PO TABS: 20.0000 mg | ORAL_TABLET | Freq: Every day | ORAL | 0 refills | Status: DC

## 2017-10-13 MED ORDER — GLIMEPIRIDE 2 MG PO TABS: ORAL_TABLET | ORAL | 2 refills | Status: DC

## 2017-10-13 MED ORDER — QUINAPRIL HCL 20 MG PO TABS: 20.0000 mg | ORAL_TABLET | Freq: Every day | ORAL | 0 refills | Status: DC
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
## 2017-11-02 ENCOUNTER — Ambulatory Visit: Payer: Medicare Other | Admitting: Cardiology

## 2017-11-13 ENCOUNTER — Other Ambulatory Visit: Payer: Self-pay | Admitting: Family Medicine

## 2017-11-28 ENCOUNTER — Encounter (HOSPITAL_COMMUNITY): Payer: Self-pay | Admitting: Emergency Medicine

## 2017-11-28 ENCOUNTER — Emergency Department (HOSPITAL_COMMUNITY): Payer: Medicare Other

## 2017-11-28 ENCOUNTER — Emergency Department (HOSPITAL_COMMUNITY)
Admission: EM | Admit: 2017-11-28 | Discharge: 2017-11-28 | Disposition: A | Discharge status: Home / Self Care | Payer: Medicare Other | Attending: Emergency Medicine | Admitting: Emergency Medicine

## 2017-11-28 DIAGNOSIS — I10 Essential (primary) hypertension: Secondary | ICD-10-CM | POA: Insufficient documentation

## 2017-11-28 DIAGNOSIS — K439 Ventral hernia without obstruction or gangrene: Secondary | ICD-10-CM | POA: Diagnosis not present

## 2017-11-28 DIAGNOSIS — F17210 Nicotine dependence, cigarettes, uncomplicated: Secondary | ICD-10-CM | POA: Insufficient documentation

## 2017-11-28 DIAGNOSIS — Z79899 Other long term (current) drug therapy: Secondary | ICD-10-CM | POA: Diagnosis not present

## 2017-11-28 DIAGNOSIS — I2510 Atherosclerotic heart disease of native coronary artery without angina pectoris: Secondary | ICD-10-CM | POA: Diagnosis not present

## 2017-11-28 DIAGNOSIS — E7800 Pure hypercholesterolemia, unspecified: Secondary | ICD-10-CM | POA: Diagnosis not present

## 2017-11-28 DIAGNOSIS — E782 Mixed hyperlipidemia: Secondary | ICD-10-CM | POA: Diagnosis not present

## 2017-11-28 DIAGNOSIS — Z7901 Long term (current) use of anticoagulants: Secondary | ICD-10-CM | POA: Diagnosis not present

## 2017-11-28 DIAGNOSIS — I252 Old myocardial infarction: Secondary | ICD-10-CM | POA: Insufficient documentation

## 2017-11-28 DIAGNOSIS — E119 Type 2 diabetes mellitus without complications: Secondary | ICD-10-CM | POA: Insufficient documentation

## 2017-11-28 DIAGNOSIS — J209 Acute bronchitis, unspecified: Secondary | ICD-10-CM | POA: Diagnosis not present

## 2017-11-28 DIAGNOSIS — R05 Cough: Secondary | ICD-10-CM | POA: Diagnosis not present

## 2017-11-28 MED ORDER — ALBUTEROL SULFATE HFA 108 (90 BASE) MCG/ACT IN AERS
1.0000 | INHALATION_SPRAY | Freq: Four times a day (QID) | RESPIRATORY_TRACT | 0 refills | Status: DC | PRN
Start: 2017-11-28 — End: 2017-11-30

## 2017-11-28 MED ORDER — AZITHROMYCIN 250 MG PO TABS: ORAL_TABLET | ORAL | 0 refills | Status: DC

## 2017-11-28 MED ORDER — BENZONATATE 100 MG PO CAPS: 100.0000 mg | ORAL_CAPSULE | Freq: Three times a day (TID) | ORAL | 0 refills | Status: DC
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
## 2017-11-28 NOTE — Discharge Instructions (Addendum)
Tessalon for cough. Amoxicillin as directed. Take probiotic-available over-the-counter-twice per day to prevent diarrhea Inhaler as needed. Primary care if not improving

## 2017-11-28 NOTE — ED Triage Notes (Addendum)
Patient complaining of cough and congestion x 4 days. States today he had severe cough and felt a pop in the middle of his abdomen during coughing. States he has hernia in same area and is complaining of pain to upper middle abdomen.

## 2017-11-30 ENCOUNTER — Encounter: Payer: Self-pay | Admitting: Family Medicine

## 2017-11-30 ENCOUNTER — Ambulatory Visit (INDEPENDENT_AMBULATORY_CARE_PROVIDER_SITE_OTHER): Payer: Medicare Other | Admitting: Family Medicine

## 2017-11-30 VITALS — BP 97/50 | HR 64 | Temp 98.1°F | Ht 64.0 in | Wt 198.0 lb

## 2017-11-30 DIAGNOSIS — E1140 Type 2 diabetes mellitus with diabetic neuropathy, unspecified: Secondary | ICD-10-CM | POA: Diagnosis not present

## 2017-11-30 DIAGNOSIS — I2510 Atherosclerotic heart disease of native coronary artery without angina pectoris: Secondary | ICD-10-CM

## 2017-11-30 DIAGNOSIS — E1151 Type 2 diabetes mellitus with diabetic peripheral angiopathy without gangrene: Secondary | ICD-10-CM

## 2017-11-30 DIAGNOSIS — E782 Mixed hyperlipidemia: Secondary | ICD-10-CM

## 2017-11-30 DIAGNOSIS — E559 Vitamin D deficiency, unspecified: Secondary | ICD-10-CM | POA: Diagnosis not present

## 2017-11-30 DIAGNOSIS — I25119 Atherosclerotic heart disease of native coronary artery with unspecified angina pectoris: Secondary | ICD-10-CM | POA: Diagnosis not present

## 2017-11-30 DIAGNOSIS — Z8639 Personal history of other endocrine, nutritional and metabolic disease: Secondary | ICD-10-CM | POA: Insufficient documentation

## 2017-11-30 DIAGNOSIS — J440 Chronic obstructive pulmonary disease with acute lower respiratory infection: Secondary | ICD-10-CM

## 2017-11-30 DIAGNOSIS — R0602 Shortness of breath: Secondary | ICD-10-CM

## 2017-11-30 DIAGNOSIS — J209 Acute bronchitis, unspecified: Secondary | ICD-10-CM

## 2017-11-30 DIAGNOSIS — I10 Essential (primary) hypertension: Secondary | ICD-10-CM | POA: Diagnosis not present

## 2017-11-30 DIAGNOSIS — K219 Gastro-esophageal reflux disease without esophagitis: Secondary | ICD-10-CM | POA: Diagnosis not present

## 2017-11-30 DIAGNOSIS — I739 Peripheral vascular disease, unspecified: Secondary | ICD-10-CM

## 2017-11-30 DIAGNOSIS — I700 Atherosclerosis of aorta: Secondary | ICD-10-CM

## 2017-11-30 LAB — BAYER DCA HB A1C WAIVED: HB A1C: 4.8 % (ref <7.0)

## 2017-11-30 MED ORDER — ALBUTEROL SULFATE HFA 108 (90 BASE) MCG/ACT IN AERS: 1.0000 | INHALATION_SPRAY | Freq: Four times a day (QID) | RESPIRATORY_TRACT | 3 refills | Status: DC | PRN

## 2017-11-30 NOTE — Progress Notes (Signed)
Subjective:    Patient ID: Dustin Dominguez, male    DOB: [birth year], 77 y.o.   MRN: [id number]  HPI Pt here for follow up and management of chronic medical problems which includes diabetes, hypertension and hyperlipidemia. He is taking medication regularly.  This patient was just seen in the emergency room for cough and bronchitis.  The visit was on May 13 and today is the 15th.  He still has the cough and he has no fever.  Vital signs are otherwise stable.  The patient is currently taking a Z-Pak after a visit to the emergency room a couple days ago.  He has had yellow sputum and was having some abdominal pain from all the coughing he is been doing and it was thought that maybe he stretched some of the scar tissue in his abdomen from the coughing and this is why he was having the pain.  He is feeling some better.  Actually though a couple months before developing this he is been having more shortness of breath and general at night and even with minimal activity during the day.  He says that he saw the cardiologist and it was felt that his heart was stable.  He denies any chest pain pressure or tightness.  He does have ongoing shortness of breath coupled with this most recent case of bronchitis.  He has ongoing constipation because of the pain medicine that he takes but has not noticed any other problems with his intestinal tract including nausea vomiting or blood in the stool.  He is passing his water well and he attributes this to taking the fluid pill.  He does not check his blood sugars very often at home.  We may give him an Anoro inhaler to try before he sees the pulmonologist and make sure he has a rescue inhaler at home when the Anoro is not working.    Patient Active Problem List   Diagnosis Date Noted  . Abdominal aortic atherosclerosis ([place]) 08/04/2016  . Protein-calorie malnutrition, severe 07/09/2016  . SBO (small bowel obstruction) ([place])   . Malnutrition of moderate degree 07/05/2016    . Abdominal pain 07/04/2016  . Scrotal abscess 07/03/2016  . Cellulitis of scrotum 07/03/2016  . Coronary artery disease involving native coronary artery of native heart with angina pectoris ([place])   . Leukocytosis 07/06/2015  . GERD (gastroesophageal reflux disease) 06/13/2013  . Vitamin D deficiency 06/13/2013  . Degenerative disc disease, cervical 06/13/2013  . Degenerative disc disease, lumbar 06/13/2013  . Multiple thyroid nodules 04/07/2012  . Mixed hyperlipidemia 01/27/2011  . Coronary atherosclerosis of native coronary artery   . Sleep apnea   . Tobacco use disorder   . Essential hypertension, benign   . 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 albuterol (PROVENTIL HFA;VENTOLIN HFA) 108 (90 Base) MCG/ACT inhaler Inhale 1-2 puffs into the lungs every 6 (six) hours as needed for wheezing.  Marland Kitchen amLODipine (NORVASC) 10 MG tablet TAKE ONE TABLET BY MOUTH EVERY DAY  . atorvastatin (LIPITOR) 20 MG tablet Take 1 tablet (20 mg total) by mouth daily.  Marland Kitchen azithromycin (ZITHROMAX Z-PAK) 250 MG tablet 6 pills over 5 days as directed.  250 mg tabs  . benzonatate (TESSALON) 100 MG capsule Take 1 capsule (100 mg total) by mouth every 8 (eight) hours.  . diazepam (VALIUM) 10 MG tablet Take 1 tablet (10 mg total) by mouth  2 (two) times daily as needed.  Marland Kitchen ELIQUIS 5 MG TABS tablet TAKE 1 TABLET BY MOUTH TWICE DAILY  . fenofibrate 160 MG tablet TAKE ONE TABLET BY MOUTH every DAY  . fish oil-omega-3 fatty acids 1000 MG capsule Take 1 g by mouth daily.   . furosemide (LASIX) 20 MG tablet TAKE ONE TABLET BY MOUTH DAILY  . glimepiride (AMARYL) 2 MG tablet TAKE 1 TABLET BY MOUTH EVERY DAY DO not fill UNTIL asked  . hydrochlorothiazide (MICROZIDE) 12.5 MG capsule TAKE 1 CAPSULE BY MOUTH EVERY DAY  . isosorbide mononitrate (IMDUR) 30 MG 24 hr tablet TAKE 1 TABLET BY MOUTH EVERY DAY  . lactulose  (CHRONULAC) 10 GM/15ML solution Take 15 mLs (10 g total) by mouth daily as needed for mild constipation, moderate constipation or severe constipation. Reported on 11/11/2015  . metoprolol tartrate (LOPRESSOR) 50 MG tablet TAKE 1/2 TABLET BY MOUTH TWICE DAILY  . morphine (MS CONTIN) 30 MG 12 hr tablet Take 30 mg by mouth 3 (three) times daily.  . naproxen (NAPROSYN) 500 MG tablet Take 1 tablet (500 mg total) by mouth 2 (two) times daily with a meal.  . oxyCODONE-acetaminophen (PERCOCET) 10-325 MG per tablet Take 1 tablet by mouth 6 (six) times daily.   . pantoprazole (PROTONIX) 40 MG tablet TAKE 1 TABLET BY MOUTH TWICE DAILY  . potassium chloride (K-DUR,KLOR-CON) 10 MEQ tablet TAKE ONE TABLET BY MOUTH DAILY  . quinapril (ACCUPRIL) 20 MG tablet Take 1 tablet (20 mg total) by mouth daily.  Marland Kitchen senna-docusate (SENOKOT S) 8.6-50 MG tablet Take 1 tablet by mouth as needed for mild constipation.  . sucralfate (CARAFATE) 1 g tablet Take 1 tablet (1 g total) by mouth 4 (four) times daily -  with meals and at bedtime.  . Vitamin D, Ergocalciferol, (DRISDOL) 50000 units CAPS capsule TAKE ONE CAPSULE BY MOUTH ONCE WEEKLY  . [DISCONTINUED] sulfamethoxazole-trimethoprim (BACTRIM DS,SEPTRA DS) 800-160 MG tablet TAKE 1 TABLET BY MOUTH TWICE DAILY  . [DISCONTINUED] cefdinir (OMNICEF) 300 MG capsule Take 1 capsule (300 mg total) by mouth 2 (two) times daily. 1 po BID   No facility-administered encounter medications on file as of 11/30/2017.      Review of Systems  Constitutional: Negative.   HENT: Negative.   Eyes: Negative.   Respiratory: Positive for cough (bronchitis).   Cardiovascular: Negative.   Gastrointestinal: Negative.   Endocrine: Negative.   Genitourinary: Negative.   Musculoskeletal: Negative.   Skin: Negative.   Allergic/Immunologic: Negative.   Neurological: Negative.   Hematological: Negative.   Psychiatric/Behavioral: Negative.        Objective:   Physical Exam  Constitutional: He is  oriented to person, place, and time. He appears well-developed and well-nourished. No distress.  The patient is pleasant and alert and says he is getting better from this most recent bout of bronchitis  HENT:  Head: Normocephalic and atraumatic.  Right Ear: External ear normal.  Left Ear: External ear normal.  Nose: Nose normal.  Mouth/Throat: Oropharynx is clear and moist. No oropharyngeal exudate.  Eyes: Pupils are equal, round, and reactive to light. Conjunctivae and EOM are normal. Right eye exhibits no discharge. Left eye exhibits no discharge.  Neck: Normal range of motion. Neck supple. No thyromegaly present.  Cardiovascular: Normal rate, regular rhythm, normal heart sounds and intact distal pulses.  No murmur heard. Heart is 72/min with a regular rate and rhythm  Pulmonary/Chest: Effort normal and breath sounds normal. He has no wheezes. He has no rales. He  exhibits no tenderness.  Congested cough with diminished breath sounds  Abdominal: Soft. Bowel sounds are normal. He exhibits no mass. There is tenderness. There is no rebound and no guarding. A hernia is present.  Multiple scars on abdomen with incisional hernia and pain to the left side of the abdomen that has just occurred recently with all the coughing.  There is no bulging at this site.  Musculoskeletal: He exhibits tenderness. He exhibits no edema.  Range of motion in this patient is limited due to back pain and now some abdominal pain in the scar tissue on his abdomen secondary to all the coughing that has been occurring recently.  Lymphadenopathy:    He has no cervical adenopathy.  Neurological: He is alert and oriented to person, place, and time. He has normal reflexes. No cranial nerve deficit.  Skin: Skin is warm and dry. No rash noted.  Psychiatric: He has a normal mood and affect. His behavior is normal. Judgment and thought content normal.  All normal  Nursing note and vitals reviewed.  BP (!) 97/50 (BP Location:  Left Arm)   Pulse 64   Temp 98.1 F (36.7 C) (Oral)   Ht '5\' 4"'$  (9.323 m)   Wt 198 lb (89.8 kg)   SpO2 98%   BMI 33.99 kg/m         Assessment & Plan:  1. Type 2 diabetes mellitus with diabetic neuropathy, without long-term current use of insulin ([place]) -New with current treatment pending results of lab work - BMP8+EGFR - CBC with Differential/Platelet - [facility] [place] Hb A1c Waived  2. ASCVD (arteriosclerotic cardiovascular disease) -Continue follow-up with cardiology as planned - CBC with Differential/Platelet - Lipid panel  3. Essential hypertension, benign -Blood pressure remains good and he will stay on current treatment - BMP8+EGFR - CBC with Differential/Platelet - Hepatic function panel  4. Mixed hyperlipidemia -Continue  fenofibrate and atorvastatin - CBC with Differential/Platelet - Lipid panel  5. Vitamin D deficiency -Continue vitamin D replacement pending results of lab work - CBC with Differential/Platelet - VITAMIN D 25 Hydroxy (Vit-D Deficiency, Fractures)  6. Gastroesophageal reflux disease, esophagitis presence not specified -No complaints with this today and he will continue with pantoprazole - CBC with Differential/Platelet - Hepatic function panel  7. Abdominal aortic atherosclerosis ([place]) -Continue with aggressive therapeutic lifestyle changes and current statin drug and fenofibrate- CBC with Differential/Platelet  8. Atherosclerosis of native coronary artery of native heart with angina pectoris [facility]) -Follow-up with cardiology as planned  9. Claudication in peripheral vascular disease ([place])  10. Diabetes mellitus with peripheral vascular disease ([place]) -We need to check blood sugars regularly and stay on current treatment pending results of lab work  11. Acute bronchitis with COPD ([place]) -Finish Z-Pak and add Mucinex maximum strength 1 twice daily with a large glass of water -Also stop the use of the overhead fan -Use inhalers as directed -  Ambulatory referral to Pulmonology  12. Shortness of breath -This is been an ongoing problem for the past 2 to 3 months prior to his most recent bout of bronchitis. - Ambulatory referral to Pulmonology  Meds ordered this encounter  Medications  . albuterol (PROVENTIL HFA;VENTOLIN HFA) 108 (90 Base) MCG/ACT inhaler    Sig: Inhale 1-2 puffs into the lungs every 6 (six) hours as needed for wheezing.    Dispense:  1 Inhaler    Refill:  3   Patient Instructions  Medicare Annual Wellness Visit  [facility] and the medical providers at [facility] strive to bring you the best medical care.  In doing so we not only want to address your current medical conditions and concerns but also to detect new conditions early and prevent illness, disease and health-related problems.    Medicare offers a yearly Wellness Visit which allows our clinical staff to assess your need for preventative services including immunizations, lifestyle education, counseling to decrease risk of preventable diseases and screening for fall risk and other medical concerns.    This visit is provided free of charge (no copay) for all Medicare recipients. The clinical pharmacists at [facility] have begun to conduct these Wellness Visits which will also include a thorough review of all your medications.    As you primary medical provider recommend that you make an appointment for your Annual Wellness Visit if you have not done so already this year.  You may set up this appointment before you leave today or you may call back ([phone]) and schedule an appointment.  Please make sure when you call that you mention that you are scheduling your Annual Wellness Visit with the clinical pharmacist so that the appointment may be made for the proper length of time.     Continue current medications. Continue good therapeutic lifestyle changes which include good diet and  exercise. Fall precautions discussed with patient. If an FOBT was given today- please return it to our front desk. If you are over 62 years old - you may need Prevnar 71 or the adult Pneumonia vaccine.  **Flu shots are available--- please call and schedule a FLU-CLINIC appointment**  After your visit with [place] today you will receive a survey in the mail or online from [employer] regarding your care with [place]. Please take a moment to fill this out. Your feedback is very important to [place] as you can help [place] better understand your patient needs as well as improve your experience and satisfaction. WE CARE ABOUT YOU!!!   Continue to take and complete the Z-Pak that was prescribed in the emergency room. Also use the Mucinex and take 1 twice daily with a large glass of water to loosen congestion and help cough We will give you a sample of an Anoro inhaler to use 1 puff once daily along with a rescue inhaler, albuterol to use if needed in addition to the Anoro inhaler. We will arrange for you to have a visit with the pulmonologist to further evaluate your breathing in a couple weeks after you have had time to get over this most recent bout of bronchitis.  Arrie Senate MD

## 2017-11-30 NOTE — Patient Instructions (Addendum)
Medicare Annual Wellness Visit  [facility] and the medical providers at [facility] strive to bring you the best medical care.  In doing so we not only want to address your current medical conditions and concerns but also to detect new conditions early and prevent illness, disease and health-related problems.    Medicare offers a yearly Wellness Visit which allows our clinical staff to assess your need for preventative services including immunizations, lifestyle education, counseling to decrease risk of preventable diseases and screening for fall risk and other medical concerns.    This visit is provided free of charge (no copay) for all Medicare recipients. The clinical pharmacists at [facility] have begun to conduct these Wellness Visits which will also include a thorough review of all your medications.    As you primary medical provider recommend that you make an appointment for your Annual Wellness Visit if you have not done so already this year.  You may set up this appointment before you leave today or you may call back ([phone]) and schedule an appointment.  Please make sure when you call that you mention that you are scheduling your Annual Wellness Visit with the clinical pharmacist so that the appointment may be made for the proper length of time.     Continue current medications. Continue good therapeutic lifestyle changes which include good diet and exercise. Fall precautions discussed with patient. If an FOBT was given today- please return it to our front desk. If you are over 32 years old - you may need Prevnar 48 or the adult Pneumonia vaccine.  **Flu shots are available--- please call and schedule a FLU-CLINIC appointment**  After your visit with [place] today you will receive a survey in the mail or online from [employer] regarding your care with [place]. Please take a moment to fill this out. Your feedback is very  important to [place] as you can help [place] better understand your patient needs as well as improve your experience and satisfaction. WE CARE ABOUT YOU!!!   Continue to take and complete the Z-Pak that was prescribed in the emergency room. Also use the Mucinex and take 1 twice daily with a large glass of water to loosen congestion and help cough We will give you a sample of an Anoro inhaler to use 1 puff once daily along with a rescue inhaler, albuterol to use if needed in addition to the Anoro inhaler. We will arrange for you to have a visit with the pulmonologist to further evaluate your breathing in a couple weeks after you have had time to get over this most recent bout of bronchitis.

## 2017-12-01 ENCOUNTER — Other Ambulatory Visit: Payer: Self-pay | Admitting: *Deleted

## 2017-12-01 DIAGNOSIS — D72829 Elevated white blood cell count, unspecified: Secondary | ICD-10-CM

## 2017-12-01 DIAGNOSIS — R710 Precipitous drop in hematocrit: Secondary | ICD-10-CM

## 2017-12-01 LAB — BMP8+EGFR
BUN / CREAT RATIO: 17 (ref 10–24)
BUN: 15 mg/dL (ref 8–27)
CO2: 25 mmol/L (ref 20–29)
Calcium: 8.4 mg/dL — ABNORMAL LOW (ref 8.6–10.2)
Chloride: 105 mmol/L (ref 96–106)
Creatinine, Ser: 0.86 mg/dL (ref 0.76–1.27)
GFR, EST AFRICAN AMERICAN: 98 mL/min/{1.73_m2} (ref >59)
GFR, EST NON AFRICAN AMERICAN: 85 mL/min/{1.73_m2} (ref >59)
Glucose: 98 mg/dL (ref 65–99)
Potassium: 4.4 mmol/L (ref 3.5–5.2)
SODIUM: 145 mmol/L — AB (ref 134–144)

## 2017-12-01 LAB — CBC WITH DIFFERENTIAL/PLATELET
BASOS: 1 %
Basophils Absolute: 0.1 10*3/uL (ref 0.0–0.2)
EOS (ABSOLUTE): 0.8 10*3/uL — AB (ref 0.0–0.4)
EOS: 5 %
HEMATOCRIT: 35.1 % — AB (ref 37.5–51.0)
Hemoglobin: 11.7 g/dL — ABNORMAL LOW (ref 13.0–17.7)
IMMATURE GRANULOCYTES: 0 %
Immature Grans (Abs): 0 10*3/uL (ref 0.0–0.1)
Lymphocytes Absolute: 4.2 10*3/uL — ABNORMAL HIGH (ref 0.7–3.1)
Lymphs: 26 %
MCH: 30.5 pg (ref 26.6–33.0)
MCHC: 33.3 g/dL (ref 31.5–35.7)
MCV: 92 fL (ref 79–97)
MONOS ABS: 1.6 10*3/uL — AB (ref 0.1–0.9)
Monocytes: 10 %
NEUTROS ABS: 9.5 10*3/uL — AB (ref 1.4–7.0)
Neutrophils: 58 %
PLATELETS: 501 10*3/uL — AB (ref 150–379)
RBC: 3.83 x10E6/uL — ABNORMAL LOW (ref 4.14–5.80)
RDW: 13.6 % (ref 12.3–15.4)
WBC: 16.3 10*3/uL — ABNORMAL HIGH (ref 3.4–10.8)

## 2017-12-01 LAB — LIPID PANEL
CHOL/HDL RATIO: 5.5 ratio — AB (ref 0.0–5.0)
Cholesterol, Total: 104 mg/dL (ref 100–199)
HDL: 19 mg/dL — AB (ref >39)
LDL Calculated: 52 mg/dL (ref 0–99)
Triglycerides: 163 mg/dL — ABNORMAL HIGH (ref 0–149)
VLDL Cholesterol Cal: 33 mg/dL (ref 5–40)

## 2017-12-01 LAB — HEPATIC FUNCTION PANEL
ALT: 13 IU/L (ref 0–44)
AST: 15 IU/L (ref 0–40)
Albumin: 3.5 g/dL (ref 3.5–4.8)
Alkaline Phosphatase: 61 IU/L (ref 39–117)
BILIRUBIN, DIRECT: 0.26 mg/dL (ref 0.00–0.40)
Bilirubin Total: 0.5 mg/dL (ref 0.0–1.2)
TOTAL PROTEIN: 6.4 g/dL (ref 6.0–8.5)

## 2017-12-01 LAB — VITAMIN D 25 HYDROXY (VIT D DEFICIENCY, FRACTURES): VIT D 25 HYDROXY: 51.5 ng/mL (ref 30.0–100.0)

## 2017-12-13 DIAGNOSIS — C44622 Squamous cell carcinoma of skin of right upper limb, including shoulder: Secondary | ICD-10-CM | POA: Diagnosis not present

## 2017-12-13 DIAGNOSIS — L570 Actinic keratosis: Secondary | ICD-10-CM | POA: Diagnosis not present

## 2017-12-22 ENCOUNTER — Other Ambulatory Visit: Payer: Medicare Other

## 2017-12-28 ENCOUNTER — Other Ambulatory Visit: Payer: Self-pay | Admitting: Family Medicine

## 2017-12-28 DIAGNOSIS — I4891 Unspecified atrial fibrillation: Secondary | ICD-10-CM

## 2017-12-28 DIAGNOSIS — G459 Transient cerebral ischemic attack, unspecified: Secondary | ICD-10-CM

## 2018-01-08 ENCOUNTER — Other Ambulatory Visit: Payer: Self-pay

## 2018-01-08 ENCOUNTER — Emergency Department (HOSPITAL_COMMUNITY): Payer: Medicare Other

## 2018-01-08 ENCOUNTER — Emergency Department (HOSPITAL_COMMUNITY)
Admission: EM | Admit: 2018-01-08 | Discharge: 2018-01-09 | Disposition: A | Discharge status: Home / Self Care | Payer: Medicare Other | Attending: Emergency Medicine | Admitting: Emergency Medicine

## 2018-01-08 ENCOUNTER — Encounter (HOSPITAL_COMMUNITY): Payer: Self-pay | Admitting: Emergency Medicine

## 2018-01-08 DIAGNOSIS — W320XXA Accidental handgun discharge, initial encounter: Secondary | ICD-10-CM | POA: Diagnosis not present

## 2018-01-08 DIAGNOSIS — I10 Essential (primary) hypertension: Secondary | ICD-10-CM | POA: Diagnosis not present

## 2018-01-08 DIAGNOSIS — Z79899 Other long term (current) drug therapy: Secondary | ICD-10-CM | POA: Diagnosis not present

## 2018-01-08 DIAGNOSIS — F17210 Nicotine dependence, cigarettes, uncomplicated: Secondary | ICD-10-CM | POA: Insufficient documentation

## 2018-01-08 DIAGNOSIS — S61412A Laceration without foreign body of left hand, initial encounter: Secondary | ICD-10-CM | POA: Insufficient documentation

## 2018-01-08 DIAGNOSIS — Y999 Unspecified external cause status: Secondary | ICD-10-CM | POA: Diagnosis not present

## 2018-01-08 DIAGNOSIS — Z7984 Long term (current) use of oral hypoglycemic drugs: Secondary | ICD-10-CM | POA: Insufficient documentation

## 2018-01-08 DIAGNOSIS — Z23 Encounter for immunization: Secondary | ICD-10-CM | POA: Insufficient documentation

## 2018-01-08 DIAGNOSIS — Z7901 Long term (current) use of anticoagulants: Secondary | ICD-10-CM | POA: Diagnosis not present

## 2018-01-08 DIAGNOSIS — Y9389 Activity, other specified: Secondary | ICD-10-CM | POA: Insufficient documentation

## 2018-01-08 DIAGNOSIS — S61012A Laceration without foreign body of left thumb without damage to nail, initial encounter: Secondary | ICD-10-CM | POA: Diagnosis not present

## 2018-01-08 DIAGNOSIS — E1151 Type 2 diabetes mellitus with diabetic peripheral angiopathy without gangrene: Secondary | ICD-10-CM | POA: Insufficient documentation

## 2018-01-08 DIAGNOSIS — Y9239 Other specified sports and athletic area as the place of occurrence of the external cause: Secondary | ICD-10-CM | POA: Insufficient documentation

## 2018-01-08 MED ORDER — DOXYCYCLINE HYCLATE 100 MG PO CAPS: 100.0000 mg | ORAL_CAPSULE | Freq: Two times a day (BID) | ORAL | 0 refills | Status: DC

## 2018-01-08 MED ORDER — TETANUS-DIPHTH-ACELL PERTUSSIS 5-2.5-18.5 LF-MCG/0.5 IM SUSP
0.5000 mL | Freq: Once | INTRAMUSCULAR | Status: AC
  Administered 2018-01-08: 0.5 mL via INTRAMUSCULAR
  Filled 2018-01-08: qty 0.5

## 2018-01-08 MED ORDER — LIDOCAINE-EPINEPHRINE (PF) 2 %-1:200000 IJ SOLN
20.0000 mL | Freq: Once | INTRAMUSCULAR | Status: AC
  Administered 2018-01-08: 20 mL
  Filled 2018-01-08: qty 20

## 2018-01-08 MED ORDER — BACITRACIN ZINC 500 UNIT/GM EX OINT: 1.0000 "application " | TOPICAL_OINTMENT | Freq: Two times a day (BID) | CUTANEOUS | 0 refills | Status: DC

## 2018-01-08 MED ORDER — IBUPROFEN 400 MG PO TABS: 400.0000 mg | ORAL_TABLET | Freq: Four times a day (QID) | ORAL | 0 refills | Status: DC | PRN

## 2018-01-08 MED ORDER — BACITRACIN ZINC 500 UNIT/GM EX OINT
TOPICAL_OINTMENT | CUTANEOUS | Status: DC
Start: 2018-01-08 — End: 2018-01-09
  Filled 2018-01-08: qty 0.9

## 2018-01-08 MED ORDER — BACITRACIN ZINC 500 UNIT/GM EX OINT
1.0000 | TOPICAL_OINTMENT | Freq: Two times a day (BID) | CUTANEOUS | Status: DC
Start: 2018-01-08 — End: 2018-01-09
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
## 2018-01-08 NOTE — Discharge Instructions (Signed)
You may keep the dressing on your hand for 24 hours but it should be changed every 12 hours with fresh antibiotic ointment and a fresh sterile dressing.  You should keep the splint on as needed during the day, you should keep this on as much as possible to prevent too much movement of the skin.  Take doxycycline twice a day to prevent infection in this wound  Ibuprofen as needed for pain, keep the wound elevated  Return to the emergency department for severe or worsening pain fever swelling redness or drainage from the wound, expected to have some bloody drainage for the next couple of days.
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
## 2018-01-09 ENCOUNTER — Ambulatory Visit: Payer: Medicare Other | Admitting: Cardiology

## 2018-01-09 MED ORDER — BACITRACIN ZINC 500 UNIT/GM EX OINT
TOPICAL_OINTMENT | CUTANEOUS | Status: AC
  Filled 2018-01-09: qty 0.9

## 2018-01-10 ENCOUNTER — Other Ambulatory Visit: Payer: Self-pay | Admitting: Family Medicine

## 2018-01-11 DIAGNOSIS — Z79891 Long term (current) use of opiate analgesic: Secondary | ICD-10-CM | POA: Diagnosis not present

## 2018-01-11 DIAGNOSIS — G894 Chronic pain syndrome: Secondary | ICD-10-CM | POA: Diagnosis not present

## 2018-01-11 DIAGNOSIS — M545 Low back pain: Secondary | ICD-10-CM | POA: Diagnosis not present

## 2018-01-18 ENCOUNTER — Encounter: Payer: Self-pay | Admitting: Physician Assistant

## 2018-01-18 ENCOUNTER — Ambulatory Visit (INDEPENDENT_AMBULATORY_CARE_PROVIDER_SITE_OTHER): Payer: Medicare Other | Admitting: Physician Assistant

## 2018-01-18 VITALS — BP 138/57 | HR 55 | Temp 97.3°F | Ht 71.0 in | Wt 195.5 lb

## 2018-01-18 DIAGNOSIS — Z4802 Encounter for removal of sutures: Secondary | ICD-10-CM

## 2018-01-18 DIAGNOSIS — S60922A Unspecified superficial injury of left hand, initial encounter: Secondary | ICD-10-CM | POA: Diagnosis not present

## 2018-01-18 NOTE — Patient Instructions (Signed)
Suture Removal, Care After Refer to this sheet in the next few weeks. These instructions provide you with information on caring for yourself after your procedure. Your health care provider may also give you more specific instructions. Your treatment has been planned according to current medical practices, but problems sometimes occur. Call your health care provider if you have any problems or questions after your procedure. What can I expect after the procedure? After your stitches (sutures) are removed, it is typical to have the following:  Some discomfort and swelling in the wound area.  Slight redness in the area.  Follow these instructions at home:  If you have skin adhesive strips over the wound area, do not take the strips off. They will fall off on their own in a few days. If the strips remain in place after 14 days, you may remove them.  Change any bandages (dressings) at least once a day or as directed by your health care provider. If the bandage sticks, soak it off with warm, soapy water.  Apply cream or ointment only as directed by your health care provider. If using cream or ointment, wash the area with soap and water 2 times a day to remove all the cream or ointment. Rinse off the soap and pat the area dry with a clean towel.  Keep the wound area dry and clean. If the bandage becomes wet or dirty, or if it develops a bad smell, change it as soon as possible.  Continue to protect the wound from injury.  Use sunscreen when out in the sun. New scars become sunburned easily. Contact a health care provider if:  You have increasing redness, swelling, or pain in the wound.  You see pus coming from the wound.  You have a fever.  You notice a bad smell coming from the wound or dressing.  Your wound breaks open (edges not staying together). This information is not intended to replace advice given to you by your health care provider. Make sure you discuss any questions you have  with your health care provider. Document Released: 03/30/2001 Document Revised: 12/11/2015 Document Reviewed: 02/14/2013 Elsevier Interactive Patient Education  2017 [employer]  
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
## 2018-01-29 IMAGING — US US SCROTUM
1 series · 13 of 25 positions shown · non-contrast
Comparison: 07/03/2016

CLINICAL DATA: Left scrotal abscess, post drainage and packing [REDACTED]

EXAM:
ULTRASOUND OF SCROTUM
TECHNIQUE: Complete ultrasound examination of the testicles, epididymis, and
other scrotal structures was performed.

[Series 1: us scrotum · 0.06mm/px · 13 of 50 slices shown]
[im 1/50]
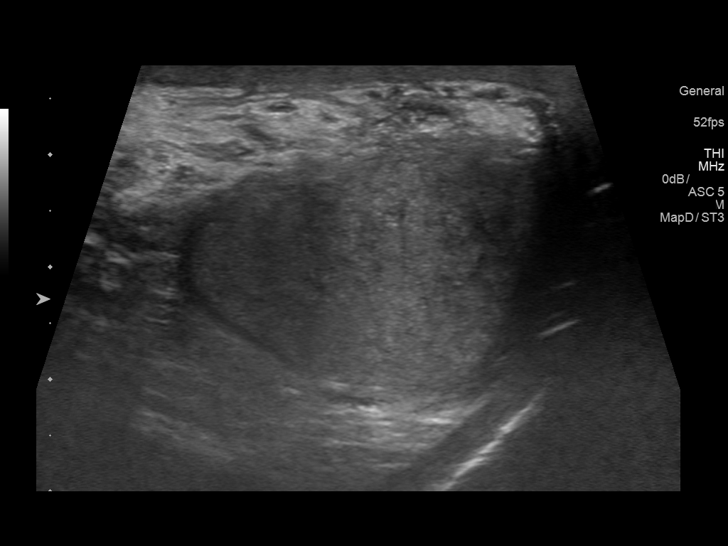
[im 5/50]
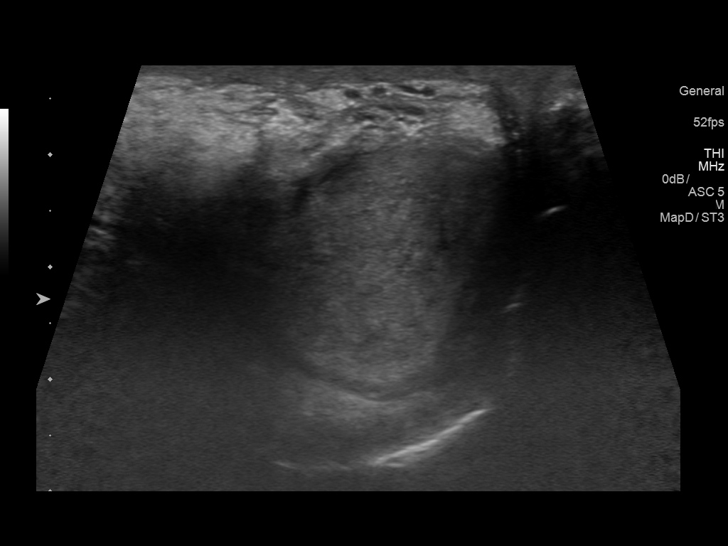
[im 9/50]
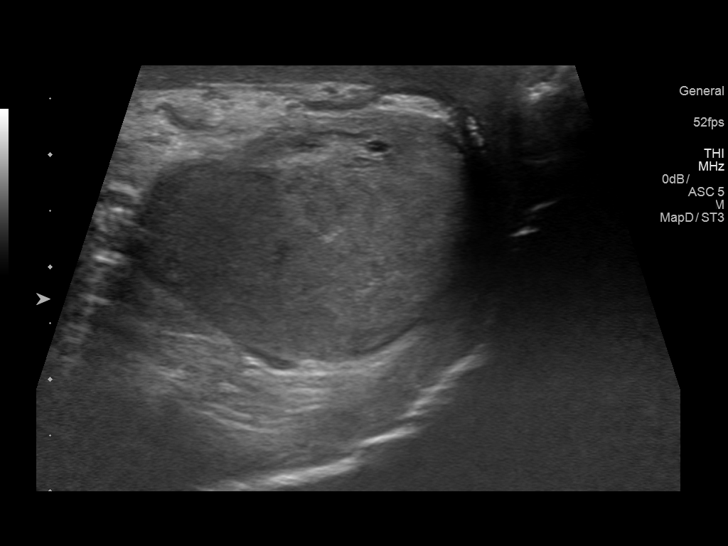
[im 13/50]
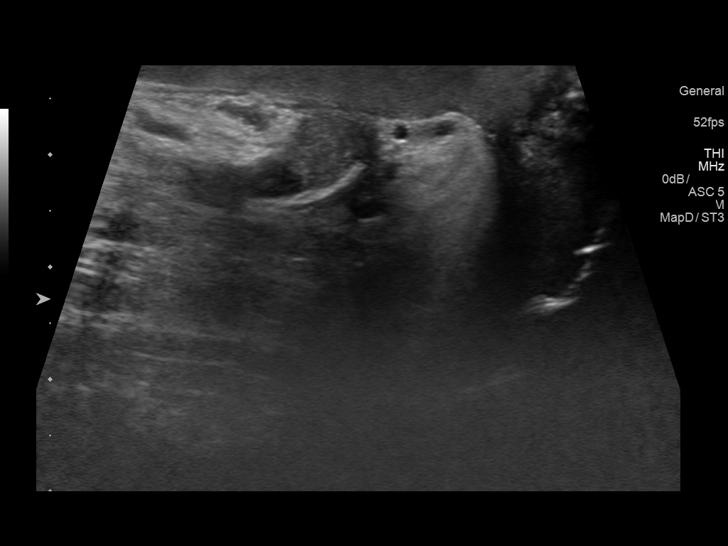
[im 17/50]
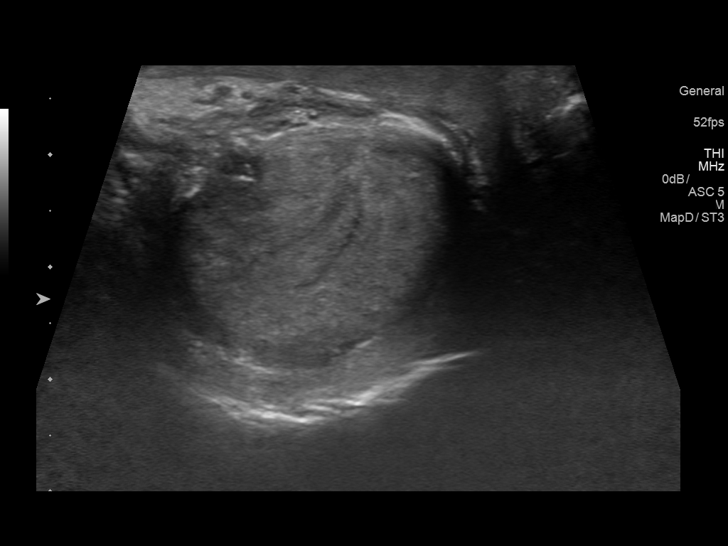
[im 21/50]
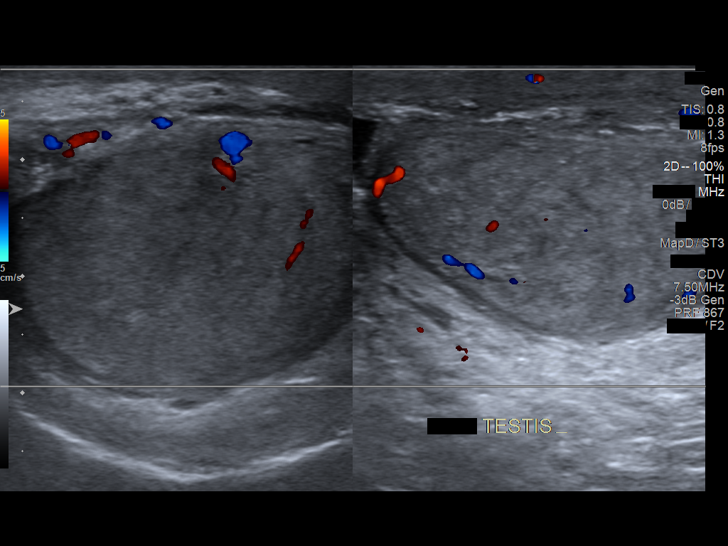
[im 25/50]
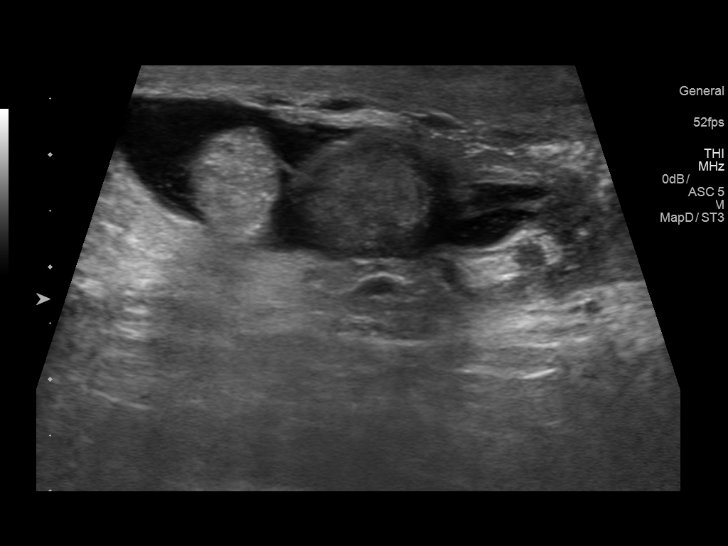
[im 29/50]
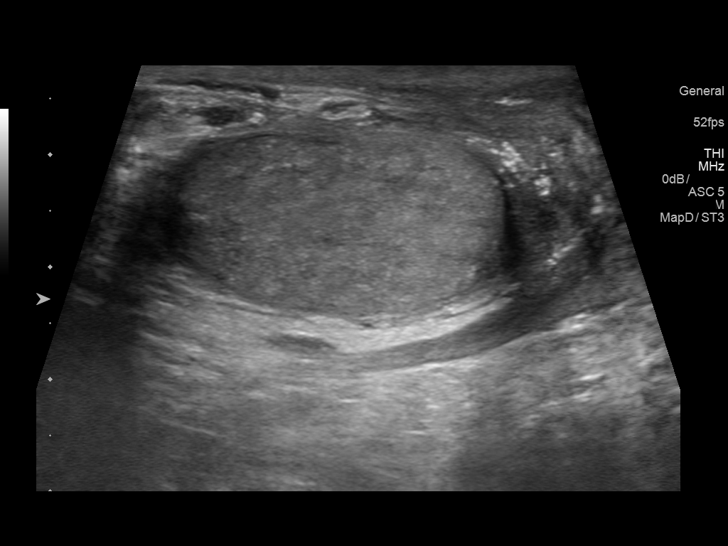
[im 33/50]
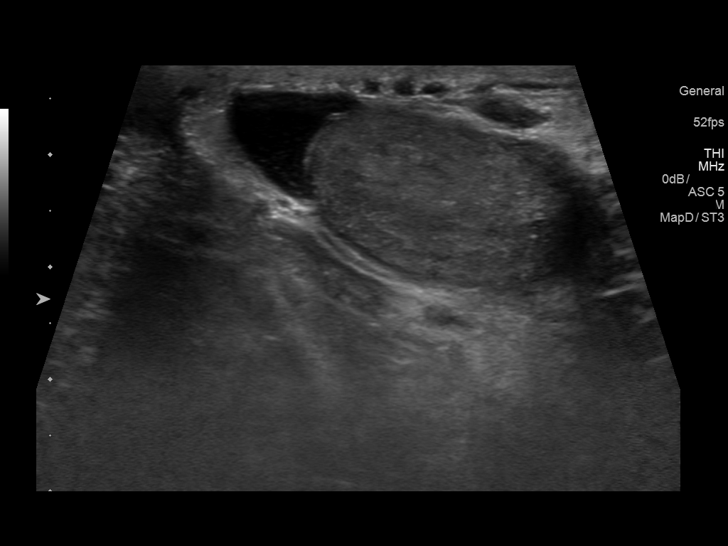
[im 37/50]
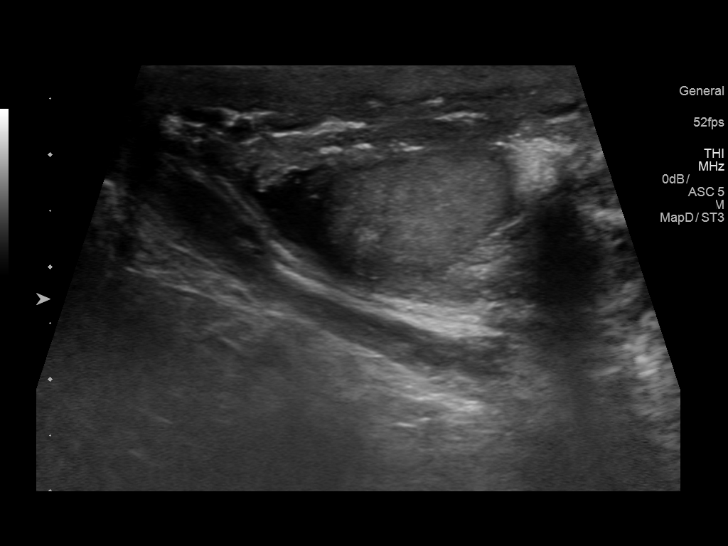
[im 41/50]
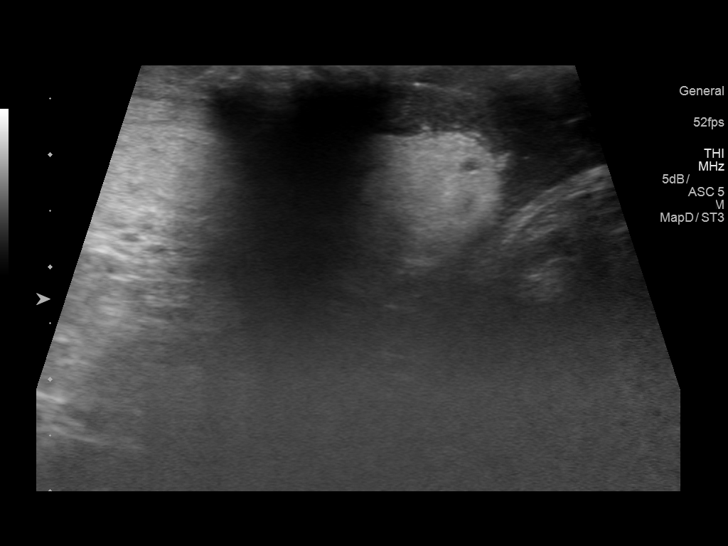
[im 45/50]
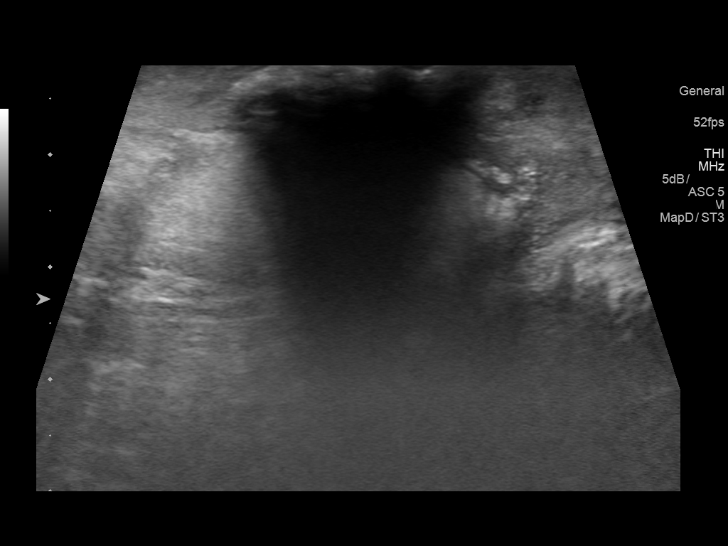
[im 50/50]
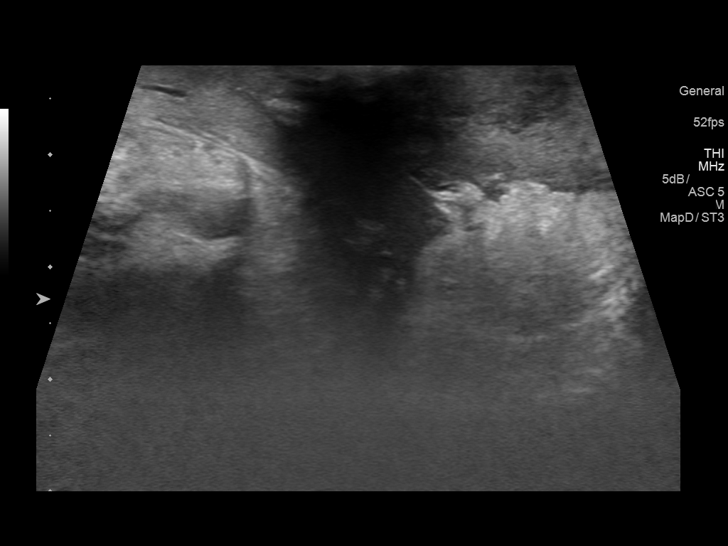

[13 of 25 positions shown; findings below may reference images not displayed]

FINDINGS: Right testicle

Measurements: 3.1 x 2.1 x 2.9 cm. No mass or microlithiasis
visualized.

Left testicle

Measurements: 3.2 x 1.6 x 3 cm. No mass or microlithiasis
visualized.

Right epididymis:  Normal in size and appearance.

Left epididymis:  Normal in size and appearance.

Hydrocele:  Small left hydrocele

Varicocele:  None visualized.

There is improvement in left scrotal wall inflammation with less
thickening and without hyperemia. Some packing material is noted at
the site of previous abscess. No definite confluent residual abscess
or new fluid collection. There is residual mild thickening of left
scrotal wall.
IMPRESSION: There is improvement in the inferior aspect of left scrotal wall
with less thickening and without hyperemia. Some packing material is
noted at the site of previous abscess. No definite confluent
residual abscess or new fluid collection. There is residual mild
thickening of left scrotal wall.

Bilateral testicle shows normal echogenicity and color Doppler flow
without testicular mass. Normal bilateral epididymis. Small left
hydrocele.

## 2018-02-02 ENCOUNTER — Ambulatory Visit (INDEPENDENT_AMBULATORY_CARE_PROVIDER_SITE_OTHER): Payer: Medicare Other | Admitting: *Deleted

## 2018-02-02 VITALS — BP 123/58 | HR 58 | Temp 97.6°F | Ht 71.0 in | Wt 198.0 lb

## 2018-02-02 DIAGNOSIS — Z0000 Encounter for general adult medical examination without abnormal findings: Secondary | ICD-10-CM | POA: Diagnosis not present

## 2018-02-02 NOTE — Patient Instructions (Signed)
  Dustin Dominguez , Thank you for taking time to come for your Medicare Wellness Visit. I appreciate your ongoing commitment to your health goals. Please review the following plan we discussed and let me know if I can assist you in the future.   These are the goals we discussed: Goals    . Prevent falls     Stay active     . Quit Smoking       This is a list of the screening recommended for you and due dates:  Health Maintenance  Topic Date Due  . Eye exam for diabetics  04/05/2016  . Stool Blood Test  01/06/2018  . Flu Shot  02/16/2018  . Hemoglobin A1C  06/02/2018  . Complete foot exam   08/02/2018  . Colon Cancer Screening  12/07/2021  . Tetanus Vaccine  01/09/2028  . Pneumonia vaccines  Completed    Keep follow up with Dr Laurance Flatten and other specialist  Stay active and try not to put yourself at risk for falls
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
## 2018-02-06 ENCOUNTER — Other Ambulatory Visit: Payer: Self-pay | Admitting: Family Medicine
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
## 2018-02-10 ENCOUNTER — Ambulatory Visit: Payer: Medicare Other | Admitting: Cardiology

## 2018-02-10 ENCOUNTER — Encounter: Payer: Self-pay | Admitting: Cardiology

## 2018-02-10 DIAGNOSIS — R0989 Other specified symptoms and signs involving the circulatory and respiratory systems: Secondary | ICD-10-CM

## 2018-03-13 ENCOUNTER — Other Ambulatory Visit: Payer: Self-pay | Admitting: Family Medicine

## 2018-03-13 NOTE — Telephone Encounter (Signed)
Last Vit D 11/30/17  51.5

## 2018-04-05 ENCOUNTER — Ambulatory Visit (INDEPENDENT_AMBULATORY_CARE_PROVIDER_SITE_OTHER): Payer: Medicare Other | Admitting: Family Medicine

## 2018-04-05 ENCOUNTER — Encounter: Payer: Self-pay | Admitting: Family Medicine

## 2018-04-05 VITALS — BP 127/56 | HR 50 | Temp 97.7°F | Ht 71.0 in | Wt 198.0 lb

## 2018-04-05 DIAGNOSIS — E782 Mixed hyperlipidemia: Secondary | ICD-10-CM | POA: Diagnosis not present

## 2018-04-05 DIAGNOSIS — E1140 Type 2 diabetes mellitus with diabetic neuropathy, unspecified: Secondary | ICD-10-CM | POA: Diagnosis not present

## 2018-04-05 DIAGNOSIS — I700 Atherosclerosis of aorta: Secondary | ICD-10-CM

## 2018-04-05 DIAGNOSIS — D692 Other nonthrombocytopenic purpura: Secondary | ICD-10-CM

## 2018-04-05 DIAGNOSIS — I10 Essential (primary) hypertension: Secondary | ICD-10-CM | POA: Diagnosis not present

## 2018-04-05 DIAGNOSIS — I2510 Atherosclerotic heart disease of native coronary artery without angina pectoris: Secondary | ICD-10-CM | POA: Diagnosis not present

## 2018-04-05 DIAGNOSIS — E1151 Type 2 diabetes mellitus with diabetic peripheral angiopathy without gangrene: Secondary | ICD-10-CM

## 2018-04-05 DIAGNOSIS — E559 Vitamin D deficiency, unspecified: Secondary | ICD-10-CM

## 2018-04-05 DIAGNOSIS — N400 Enlarged prostate without lower urinary tract symptoms: Secondary | ICD-10-CM

## 2018-04-05 DIAGNOSIS — K219 Gastro-esophageal reflux disease without esophagitis: Secondary | ICD-10-CM

## 2018-04-05 LAB — URINALYSIS, COMPLETE
BILIRUBIN UA: NEGATIVE
GLUCOSE, UA: NEGATIVE
Ketones, UA: NEGATIVE
Nitrite, UA: NEGATIVE
RBC UA: NEGATIVE
SPEC GRAV UA: 1.02 (ref 1.005–1.030)
UUROB: 0.2 mg/dL (ref 0.2–1.0)
pH, UA: 5.5 (ref 5.0–7.5)

## 2018-04-05 LAB — MICROSCOPIC EXAMINATION: Renal Epithel, UA: NONE SEEN /hpf

## 2018-04-05 LAB — BAYER DCA HB A1C WAIVED: HB A1C: 5.2 % (ref <7.0)

## 2018-04-05 MED ORDER — DIAZEPAM 10 MG PO TABS: 10.0000 mg | ORAL_TABLET | Freq: Two times a day (BID) | ORAL | 5 refills | Status: DC | PRN

## 2018-04-05 NOTE — Progress Notes (Signed)
Subjective:    Patient ID: Dustin Dominguez, male    DOB: December 28, 1940, 77 y.o.   MRN: [id number]  HPI  Pt here for follow up and management of chronic medical problems which includes diabetes, hypertension and hyperlipidemia. He is taking medication regularly.  This patient is doing well overall.  He has chronic neck and back pain and sees Dr. Herma Mering for periodic injections and medication control.  He is due to have a rectal exam today he will be given an FOBT to return and will get lab work today.  We will also get a urine today.  His weight is stable with a BMI 27.63.  His vital signs are good.  His pulse rate is 50.  The patient has had angioplasty.  He has multiple diagnoses and multiple problems.  He has had a CABG.  He has had a scrotal abscess that has been difficult to clear.  He is been followed by the urologist regularly.  His biggest issues are his chronic low back pain hyperlipidemia hypertension coronary artery disease anxiety skin cancer.  She is pleasant today.  He has not seen the cardiologist in a good while because things seem to come up when he has the visit scheduled he has not made a return visit.  He does have some shortness of breath at nighttime but usually not as much during the day when he is moving around.  He denies any more chest pain and usual.  His biggest issues with his intestinal tract are constipation and he attributes this to the pain medicine that he is taking for his back pain.  He has not seen any blood in the stool or had any black tarry bowel movements.  He is passing his water frequently but no burning or pain.  He does not check his blood sugars regularly because they run so well.   Patient Active Problem List   Diagnosis Date Noted  . Diabetes mellitus with peripheral vascular disease ([place]) 11/30/2017  . Abdominal aortic atherosclerosis ([place]) 08/04/2016  . Protein-calorie malnutrition, severe 07/09/2016  . SBO (small bowel obstruction) ([place])   .  Malnutrition of moderate degree 07/05/2016  . Abdominal pain 07/04/2016  . Scrotal abscess 07/03/2016  . Cellulitis of scrotum 07/03/2016  . Atherosclerosis of native coronary artery of native heart with angina pectoris ([place])   . Leukocytosis 07/06/2015  . GERD (gastroesophageal reflux disease) 06/13/2013  . Vitamin D deficiency 06/13/2013  . Degenerative disc disease, cervical 06/13/2013  . Degenerative disc disease, lumbar 06/13/2013  . Multiple thyroid nodules 04/07/2012  . Mixed hyperlipidemia 01/27/2011  . Coronary atherosclerosis of native coronary artery   . Sleep apnea   . Tobacco use disorder   . Essential hypertension, benign   . 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 Claudication in peripheral vascular disease [facility])    Outpatient Encounter Medications as of 04/05/2018  Medication Sig  . albuterol (PROVENTIL HFA;VENTOLIN HFA) 108 (90 Base) MCG/ACT inhaler Inhale 1-2 puffs into the lungs every 6 (six) hours as needed for wheezing.  Marland Kitchen amLODipine (NORVASC) 10 MG tablet TAKE ONE TABLET BY MOUTH EVERY DAY  . atorvastatin (LIPITOR) 20 MG tablet Take 1 tablet (20 mg total) by mouth daily.  . bacitracin ointment Apply 1 application topically 2 (two) times daily.  . bisacodyl (DULCOLAX) 10 MG suppository Place 10 mg rectally as needed for mild constipation or moderate constipation.  . diazepam (VALIUM) 10 MG tablet TAKE 1 TABLET  BY MOUTH TWICE DAILY AS NEEDED  . ELIQUIS 5 MG TABS tablet TAKE 1 TABLET BY MOUTH TWICE DAILY  . fenofibrate 160 MG tablet TAKE ONE TABLET BY MOUTH EVERY DAY  . fish oil-omega-3 fatty acids 1000 MG capsule Take 1 g by mouth daily.   . furosemide (LASIX) 20 MG tablet TAKE ONE TABLET BY MOUTH DAILY (Patient taking differently: TAKE ONE-HALF TABLET BY MOUTH DAILY)  . glimepiride (AMARYL) 2 MG tablet TAKE 1 TABLET BY MOUTH EVERY DAY DO not fill UNTIL asked  . ibuprofen (ADVIL,MOTRIN) 400 MG tablet Take 1 tablet (400 mg  total) by mouth every 6 (six) hours as needed.  . isosorbide mononitrate (IMDUR) 30 MG 24 hr tablet TAKE 1 TABLET BY MOUTH EVERY DAY  . lactulose (CHRONULAC) 10 GM/15ML solution Take 15 mLs (10 g total) by mouth daily as needed for mild constipation, moderate constipation or severe constipation. Reported on 11/11/2015  . metoprolol tartrate (LOPRESSOR) 50 MG tablet TAKE 1/2 TABLET BY MOUTH TWICE DAILY  . morphine (MS CONTIN) 30 MG 12 hr tablet Take 30 mg by mouth 3 (three) times daily.  Marland Kitchen oxyCODONE-acetaminophen (PERCOCET) 10-325 MG per tablet Take 1 tablet by mouth 6 (six) times daily.   . pantoprazole (PROTONIX) 40 MG tablet TAKE 1 TABLET BY MOUTH TWICE DAILY  . potassium chloride (K-DUR,KLOR-CON) 10 MEQ tablet TAKE ONE TABLET BY MOUTH DAILY  . quinapril (ACCUPRIL) 20 MG tablet Take 1 tablet (20 mg total) by mouth daily.  . Vitamin D, Ergocalciferol, (DRISDOL) 50000 units CAPS capsule TAKE ONE CAPSULE BY MOUTH ONCE A WEEK   No facility-administered encounter medications on file as of 04/05/2018.      Review of Systems  Constitutional: Negative.   HENT: Negative.   Eyes: Negative.   Respiratory: Negative.   Cardiovascular: Negative.   Gastrointestinal: Negative.   Endocrine: Negative.   Genitourinary: Negative.   Musculoskeletal: Negative.   Skin: Negative.   Allergic/Immunologic: Negative.   Neurological: Negative.   Hematological: Negative.   Psychiatric/Behavioral: Negative.        Objective:   Physical Exam  Constitutional: He is oriented to person, place, and time. He appears well-developed and well-nourished.  The patient is pleasant and has his usual complaints which he seems to be learning to live with.  HENT:  Head: Normocephalic and atraumatic.  Right Ear: External ear normal.  Left Ear: External ear normal.  Nose: Nose normal.  Mouth/Throat: Oropharynx is clear and moist. No oropharyngeal exudate.  Eyes: Pupils are equal, round, and reactive to light. Conjunctivae  and EOM are normal. Right eye exhibits no discharge. Left eye exhibits no discharge. No scleral icterus.  Patient is in need of eye exam he says he will get this done.  Neck: Normal range of motion. Neck supple. No tracheal deviation present. No thyromegaly present.  No bruits thyromegaly or anterior cervical adenopathy or masses.  Cardiovascular: Normal rate, regular rhythm, normal heart sounds and intact distal pulses.  No murmur heard. The heart is regular today at 60/min.  Distal pulses were present but weaker on the left than the right.  Pulmonary/Chest: Effort normal and breath sounds normal. He has no wheezes. He has no rales. He exhibits no tenderness.  Clear anteriorly and posteriorly with a dry cough  Abdominal: Soft. Bowel sounds are normal. He exhibits no mass. There is no tenderness. There is no guarding.  Generalized abdominal tenderness and asymmetry of abdomen with more protrusion on the right side.  No masses organ enlargement or  bruits.  Good inguinal pulses.  No inguinal adenopathy.  Genitourinary: Rectum normal and penis normal.  Genitourinary Comments: The left scrotum and testicle area were larger and more sensitive to palpation.  No inguinal hernias were palpated.  The prostate was only slightly enlarged without lumps or masses and there were no rectal masses.  Musculoskeletal: He exhibits tenderness and deformity. He exhibits no edema.  Has kyphoscoliosis and has difficulty with laying flat on the table because of pain in the back and neck.  Continues to follow-up regularly with Dr. Herma Mering  Lymphadenopathy:    He has no cervical adenopathy.  Neurological: He is alert and oriented to person, place, and time. He has normal reflexes. No cranial nerve deficit.  Skin: Skin is warm and dry. No rash noted.  Senile purpura especially noted on both arms.  Psychiatric: He has a normal mood and affect. His behavior is normal. Judgment and thought content normal.  The patient's mood  affect and behavior were all normal for him.  Nursing note and vitals reviewed.   BP (!) 127/56 (BP Location: Left Arm)   Pulse (!) 50   Temp 97.7 F (36.5 C) (Oral)   Ht '5\' 11"'$  (1.803 m)   Wt 198 lb (89.8 kg)   BMI 27.62 kg/m        Assessment & Plan:  1. Type 2 diabetes mellitus with diabetic neuropathy, without long-term current use of insulin ([place]) -Unfortunately, the patient does not check any blood sugars on a regular basis.  Have encouraged him to check these periodically.  He says when he has checked them they are always good. - BMP8+EGFR - CBC with Differential/Platelet - Bayer DCA Hb A1c Waived  2. ASCVD (arteriosclerotic cardiovascular disease) -Patient has a history of coronary artery disease and has had coronary bypass grafting.  He is supposed to be following up with a cardiologist but cannot keep up with these appointments on a regular basis. - BMP8+EGFR - CBC with Differential/Platelet  3. Essential hypertension, benign -The blood pressure is good today and he will stay on current treatment - BMP8+EGFR - CBC with Differential/Platelet - Hepatic function panel  4. Mixed hyperlipidemia -Continue current treatment pending results of lab work - CBC with Differential/Platelet - Lipid panel  5. Vitamin D deficiency -Continue with vitamin D replacement pending results of lab work - CBC with Differential/Platelet - VITAMIN D 25 Hydroxy (Vit-D Deficiency, Fractures)  6. Gastroesophageal reflux disease, esophagitis presence not specified -No complaints today with reflux.  He will try to limit the use of ibuprofen as much as possible and watch his diet to keep from irritating his stomach. - CBC with Differential/Platelet  7. Benign prostatic hyperplasia, unspecified whether lower urinary tract symptoms present -The patient's biggest problem have been the left scrotal abscess.  He is currently not having any problems with that.  He denies any trouble with voiding.   The prostate is slightly enlarged without any lumps or masses. - Urinalysis, Complete  8. Abdominal aortic atherosclerosis ([place]) -Continue with aggressive therapeutic lifestyle changes including atorvastatin and fenofibrate.  9. Diabetes mellitus with peripheral vascular disease ([place]) -Continue with good diabetes management trying to achieve weight loss through exercise and diet  10. Senile purpura ([place]) -Patient is on Eliquis.  Meds ordered this encounter  Medications  . diazepam (VALIUM) 10 MG tablet    Sig: Take 1 tablet (10 mg total) by mouth 2 (two) times daily as needed.    Dispense:  60 tablet    Refill:  5   Patient Instructions                       Medicare Annual Wellness Visit  [facility] and the medical providers at [facility] strive to bring you the best medical care.  In doing so we not only want to address your current medical conditions and concerns but also to detect new conditions early and prevent illness, disease and health-related problems.    Medicare offers a yearly Wellness Visit which allows our clinical staff to assess your need for preventative services including immunizations, lifestyle education, counseling to decrease risk of preventable diseases and screening for fall risk and other medical concerns.    This visit is provided free of charge (no copay) for all Medicare recipients. The clinical pharmacists at [facility] have begun to conduct these Wellness Visits which will also include a thorough review of all your medications.    As you primary medical provider recommend that you make an appointment for your Annual Wellness Visit if you have not done so already this year.  You may set up this appointment before you leave today or you may call back ([phone]) and schedule an appointment.  Please make sure when you call that you mention that you are scheduling your Annual Wellness Visit with the clinical  pharmacist so that the appointment may be made for the proper length of time.     Continue current medications. Continue good therapeutic lifestyle changes which include good diet and exercise. Fall precautions discussed with patient. If an FOBT was given today- please return it to our front desk. If you are over 52 years old - you may need Prevnar 57 or the adult Pneumonia vaccine.  **Flu shots are available--- please call and schedule a FLU-CLINIC appointment**  After your visit with [place] today you will receive a survey in the mail or online from [employer] regarding your care with [place]. Please take a moment to fill this out. Your feedback is very important to [place] as you can help [place] better understand your patient needs as well as improve your experience and satisfaction. WE CARE ABOUT YOU!!!   Be sure and schedule visit with ophthalmologist Call and reschedule visit with cardiology Continue to be careful and do not put yourself at risk for falling Drink more water We will call with lab work results as soon as these results become available Even though your blood sugars run well still check them occasionally even if it is not but once or twice a month. Always watch for bleeding issues  Arrie Senate MD

## 2018-04-05 NOTE — Patient Instructions (Addendum)
Medicare Annual Wellness Visit  [facility] and the medical providers at [facility] strive to bring you the best medical care.  In doing so we not only want to address your current medical conditions and concerns but also to detect new conditions early and prevent illness, disease and health-related problems.    Medicare offers a yearly Wellness Visit which allows our clinical staff to assess your need for preventative services including immunizations, lifestyle education, counseling to decrease risk of preventable diseases and screening for fall risk and other medical concerns.    This visit is provided free of charge (no copay) for all Medicare recipients. The clinical pharmacists at [facility] have begun to conduct these Wellness Visits which will also include a thorough review of all your medications.    As you primary medical provider recommend that you make an appointment for your Annual Wellness Visit if you have not done so already this year.  You may set up this appointment before you leave today or you may call back ([phone]) and schedule an appointment.  Please make sure when you call that you mention that you are scheduling your Annual Wellness Visit with the clinical pharmacist so that the appointment may be made for the proper length of time.     Continue current medications. Continue good therapeutic lifestyle changes which include good diet and exercise. Fall precautions discussed with patient. If an FOBT was given today- please return it to our front desk. If you are over 48 years old - you may need Prevnar 48 or the adult Pneumonia vaccine.  **Flu shots are available--- please call and schedule a FLU-CLINIC appointment**  After your visit with [place] today you will receive a survey in the mail or online from [employer] regarding your care with [place]. Please take a moment to fill this out. Your feedback is very  important to [place] as you can help [place] better understand your patient needs as well as improve your experience and satisfaction. WE CARE ABOUT YOU!!!   Be sure and schedule visit with ophthalmologist Call and reschedule visit with cardiology Continue to be careful and do not put yourself at risk for falling Drink more water We will call with lab work results as soon as these results become available Even though your blood sugars run well still check them occasionally even if it is not but once or twice a month. Always watch for bleeding issues

## 2018-04-06 LAB — BMP8+EGFR
BUN/Creatinine Ratio: 17 (ref 10–24)
BUN: 16 mg/dL (ref 8–27)
CALCIUM: 9 mg/dL (ref 8.6–10.2)
CO2: 25 mmol/L (ref 20–29)
Chloride: 104 mmol/L (ref 96–106)
Creatinine, Ser: 0.94 mg/dL (ref 0.76–1.27)
GFR calc Af Amer: 91 mL/min/{1.73_m2} (ref >59)
GFR calc non Af Amer: 79 mL/min/{1.73_m2} (ref >59)
GLUCOSE: 40 mg/dL — AB (ref 65–99)
POTASSIUM: 4.1 mmol/L (ref 3.5–5.2)
SODIUM: 145 mmol/L — AB (ref 134–144)

## 2018-04-06 LAB — CBC WITH DIFFERENTIAL/PLATELET
BASOS ABS: 0.2 10*3/uL (ref 0.0–0.2)
BASOS: 1 %
EOS (ABSOLUTE): 0.9 10*3/uL — AB (ref 0.0–0.4)
Eos: 6 %
Hematocrit: 38.4 % (ref 37.5–51.0)
Hemoglobin: 12.6 g/dL — ABNORMAL LOW (ref 13.0–17.7)
IMMATURE GRANS (ABS): 0 10*3/uL (ref 0.0–0.1)
IMMATURE GRANULOCYTES: 0 %
LYMPHS: 46 %
Lymphocytes Absolute: 6.8 10*3/uL — ABNORMAL HIGH (ref 0.7–3.1)
MCH: 30.2 pg (ref 26.6–33.0)
MCHC: 32.8 g/dL (ref 31.5–35.7)
MCV: 92 fL (ref 79–97)
MONOS ABS: 1.1 10*3/uL — AB (ref 0.1–0.9)
Monocytes: 8 %
NEUTROS PCT: 39 %
Neutrophils Absolute: 5.7 10*3/uL (ref 1.4–7.0)
PLATELETS: 324 10*3/uL (ref 150–450)
RBC: 4.17 x10E6/uL (ref 4.14–5.80)
RDW: 12.8 % (ref 12.3–15.4)
WBC: 14.7 10*3/uL — ABNORMAL HIGH (ref 3.4–10.8)

## 2018-04-06 LAB — LIPID PANEL
CHOL/HDL RATIO: 5.2 ratio — AB (ref 0.0–5.0)
CHOLESTEROL TOTAL: 130 mg/dL (ref 100–199)
HDL: 25 mg/dL — ABNORMAL LOW (ref >39)
LDL CALC: 68 mg/dL (ref 0–99)
Triglycerides: 186 mg/dL — ABNORMAL HIGH (ref 0–149)
VLDL Cholesterol Cal: 37 mg/dL (ref 5–40)

## 2018-04-06 LAB — VITAMIN D 25 HYDROXY (VIT D DEFICIENCY, FRACTURES): Vit D, 25-Hydroxy: 42.7 ng/mL (ref 30.0–100.0)

## 2018-04-06 LAB — HEPATIC FUNCTION PANEL
ALBUMIN: 4 g/dL (ref 3.5–4.8)
ALT: 16 IU/L (ref 0–44)
AST: 20 IU/L (ref 0–40)
Alkaline Phosphatase: 34 IU/L — ABNORMAL LOW (ref 39–117)
Bilirubin Total: 0.5 mg/dL (ref 0.0–1.2)
Bilirubin, Direct: 0.18 mg/dL (ref 0.00–0.40)
Total Protein: 6.2 g/dL (ref 6.0–8.5)

## 2018-04-13 ENCOUNTER — Other Ambulatory Visit: Payer: Self-pay | Admitting: Family Medicine

## 2018-05-10 DIAGNOSIS — M545 Low back pain: Secondary | ICD-10-CM | POA: Diagnosis not present

## 2018-05-10 DIAGNOSIS — Z79891 Long term (current) use of opiate analgesic: Secondary | ICD-10-CM | POA: Diagnosis not present

## 2018-05-10 DIAGNOSIS — G894 Chronic pain syndrome: Secondary | ICD-10-CM | POA: Diagnosis not present

## 2018-05-11 ENCOUNTER — Other Ambulatory Visit: Payer: Self-pay | Admitting: Family Medicine

## 2018-05-16 DIAGNOSIS — C44629 Squamous cell carcinoma of skin of left upper limb, including shoulder: Secondary | ICD-10-CM | POA: Diagnosis not present

## 2018-05-16 DIAGNOSIS — L570 Actinic keratosis: Secondary | ICD-10-CM | POA: Diagnosis not present

## 2018-05-16 DIAGNOSIS — D0422 Carcinoma in situ of skin of left ear and external auricular canal: Secondary | ICD-10-CM | POA: Diagnosis not present

## 2018-05-16 DIAGNOSIS — D485 Neoplasm of uncertain behavior of skin: Secondary | ICD-10-CM | POA: Diagnosis not present

## 2018-05-16 DIAGNOSIS — D0462 Carcinoma in situ of skin of left upper limb, including shoulder: Secondary | ICD-10-CM | POA: Diagnosis not present

## 2018-05-16 DIAGNOSIS — C4442 Squamous cell carcinoma of skin of scalp and neck: Secondary | ICD-10-CM | POA: Diagnosis not present

## 2018-05-16 DIAGNOSIS — C44229 Squamous cell carcinoma of skin of left ear and external auricular canal: Secondary | ICD-10-CM | POA: Diagnosis not present

## 2018-05-19 DIAGNOSIS — Z23 Encounter for immunization: Secondary | ICD-10-CM | POA: Diagnosis not present

## 2018-05-25 DIAGNOSIS — E119 Type 2 diabetes mellitus without complications: Secondary | ICD-10-CM | POA: Diagnosis not present

## 2018-05-25 DIAGNOSIS — Z961 Presence of intraocular lens: Secondary | ICD-10-CM | POA: Diagnosis not present

## 2018-05-25 DIAGNOSIS — Z7984 Long term (current) use of oral hypoglycemic drugs: Secondary | ICD-10-CM | POA: Diagnosis not present

## 2018-05-25 DIAGNOSIS — H26493 Other secondary cataract, bilateral: Secondary | ICD-10-CM | POA: Diagnosis not present

## 2018-06-08 ENCOUNTER — Other Ambulatory Visit: Payer: Self-pay | Admitting: Family Medicine

## 2018-06-08 NOTE — Telephone Encounter (Signed)
Last lipid and Vit D 04/05/18  42.7

## 2018-06-19 DIAGNOSIS — H26492 Other secondary cataract, left eye: Secondary | ICD-10-CM | POA: Diagnosis not present

## 2018-06-26 DIAGNOSIS — L0100 Impetigo, unspecified: Secondary | ICD-10-CM | POA: Diagnosis not present

## 2018-06-28 ENCOUNTER — Other Ambulatory Visit: Payer: Self-pay | Admitting: Family Medicine

## 2018-06-28 DIAGNOSIS — I4891 Unspecified atrial fibrillation: Secondary | ICD-10-CM

## 2018-06-28 DIAGNOSIS — G459 Transient cerebral ischemic attack, unspecified: Secondary | ICD-10-CM

## 2018-06-29 ENCOUNTER — Emergency Department (HOSPITAL_COMMUNITY): Payer: Medicare Other

## 2018-06-29 ENCOUNTER — Other Ambulatory Visit: Payer: Self-pay

## 2018-06-29 ENCOUNTER — Observation Stay (HOSPITAL_COMMUNITY)
Admission: EM | Admit: 2018-06-29 | Discharge: 2018-06-30 | Disposition: A | Discharge status: Home / Self Care | Payer: Medicare Other | Attending: Family Medicine | Admitting: Family Medicine

## 2018-06-29 ENCOUNTER — Encounter (HOSPITAL_COMMUNITY): Payer: Self-pay | Admitting: Emergency Medicine

## 2018-06-29 DIAGNOSIS — R1084 Generalized abdominal pain: Secondary | ICD-10-CM | POA: Diagnosis present

## 2018-06-29 DIAGNOSIS — N390 Urinary tract infection, site not specified: Secondary | ICD-10-CM | POA: Diagnosis not present

## 2018-06-29 DIAGNOSIS — E785 Hyperlipidemia, unspecified: Secondary | ICD-10-CM

## 2018-06-29 DIAGNOSIS — R270 Ataxia, unspecified: Secondary | ICD-10-CM | POA: Diagnosis not present

## 2018-06-29 DIAGNOSIS — N50819 Testicular pain, unspecified: Secondary | ICD-10-CM | POA: Diagnosis not present

## 2018-06-29 DIAGNOSIS — I10 Essential (primary) hypertension: Secondary | ICD-10-CM

## 2018-06-29 DIAGNOSIS — N5082 Scrotal pain: Secondary | ICD-10-CM

## 2018-06-29 DIAGNOSIS — F419 Anxiety disorder, unspecified: Secondary | ICD-10-CM | POA: Diagnosis not present

## 2018-06-29 DIAGNOSIS — K5909 Other constipation: Secondary | ICD-10-CM

## 2018-06-29 DIAGNOSIS — K219 Gastro-esophageal reflux disease without esophagitis: Secondary | ICD-10-CM | POA: Diagnosis not present

## 2018-06-29 DIAGNOSIS — J322 Chronic ethmoidal sinusitis: Secondary | ICD-10-CM

## 2018-06-29 DIAGNOSIS — E1169 Type 2 diabetes mellitus with other specified complication: Secondary | ICD-10-CM | POA: Diagnosis not present

## 2018-06-29 DIAGNOSIS — K5900 Constipation, unspecified: Secondary | ICD-10-CM | POA: Diagnosis not present

## 2018-06-29 DIAGNOSIS — I70209 Unspecified atherosclerosis of native arteries of extremities, unspecified extremity: Secondary | ICD-10-CM

## 2018-06-29 DIAGNOSIS — D649 Anemia, unspecified: Secondary | ICD-10-CM

## 2018-06-29 DIAGNOSIS — R234 Changes in skin texture: Secondary | ICD-10-CM

## 2018-06-29 DIAGNOSIS — D599 Acquired hemolytic anemia, unspecified: Secondary | ICD-10-CM | POA: Diagnosis not present

## 2018-06-29 DIAGNOSIS — J0120 Acute ethmoidal sinusitis, unspecified: Secondary | ICD-10-CM | POA: Diagnosis not present

## 2018-06-29 DIAGNOSIS — E119 Type 2 diabetes mellitus without complications: Secondary | ICD-10-CM | POA: Insufficient documentation

## 2018-06-29 DIAGNOSIS — E1151 Type 2 diabetes mellitus with diabetic peripheral angiopathy without gangrene: Secondary | ICD-10-CM | POA: Diagnosis present

## 2018-06-29 DIAGNOSIS — R109 Unspecified abdominal pain: Secondary | ICD-10-CM | POA: Insufficient documentation

## 2018-06-29 DIAGNOSIS — R42 Dizziness and giddiness: Secondary | ICD-10-CM | POA: Diagnosis not present

## 2018-06-29 DIAGNOSIS — I480 Paroxysmal atrial fibrillation: Secondary | ICD-10-CM

## 2018-06-29 DIAGNOSIS — I2510 Atherosclerotic heart disease of native coronary artery without angina pectoris: Secondary | ICD-10-CM | POA: Diagnosis present

## 2018-06-29 DIAGNOSIS — G8929 Other chronic pain: Secondary | ICD-10-CM

## 2018-06-29 DIAGNOSIS — I70203 Unspecified atherosclerosis of native arteries of extremities, bilateral legs: Secondary | ICD-10-CM | POA: Diagnosis not present

## 2018-06-29 LAB — BASIC METABOLIC PANEL
Anion gap: 5 (ref 5–15)
BUN: 16 mg/dL (ref 8–23)
CO2: 28 mmol/L (ref 22–32)
CREATININE: 0.82 mg/dL (ref 0.61–1.24)
Calcium: 7.9 mg/dL — ABNORMAL LOW (ref 8.9–10.3)
Chloride: 109 mmol/L (ref 98–111)
GFR calc Af Amer: >60 mL/min (ref >60)
GFR calc non Af Amer: >60 mL/min (ref >60)
GLUCOSE: 122 mg/dL — AB (ref 70–99)
Potassium: 3.8 mmol/L (ref 3.5–5.1)
Sodium: 142 mmol/L (ref 135–145)

## 2018-06-29 LAB — CBC
HCT: 36.6 % — ABNORMAL LOW (ref 39.0–52.0)
Hemoglobin: 11.9 g/dL — ABNORMAL LOW (ref 13.0–17.0)
MCH: 30.2 pg (ref 26.0–34.0)
MCHC: 32.5 g/dL (ref 30.0–36.0)
MCV: 92.9 fL (ref 80.0–100.0)
Platelets: 315 10*3/uL (ref 150–400)
RBC: 3.94 MIL/uL — ABNORMAL LOW (ref 4.22–5.81)
RDW: 13.9 % (ref 11.5–15.5)
WBC: 14 10*3/uL — ABNORMAL HIGH (ref 4.0–10.5)
nRBC: 0 % (ref 0.0–0.2)

## 2018-06-29 LAB — URINALYSIS, ROUTINE W REFLEX MICROSCOPIC
Bilirubin Urine: NEGATIVE
Glucose, UA: NEGATIVE mg/dL
Hgb urine dipstick: NEGATIVE
KETONES UR: NEGATIVE mg/dL
Nitrite: NEGATIVE
Protein, ur: 30 mg/dL — AB
Specific Gravity, Urine: 1.018 (ref 1.005–1.030)
WBC, UA: >50 WBC/hpf — ABNORMAL HIGH (ref 0–5)
pH: 6 (ref 5.0–8.0)

## 2018-06-29 LAB — TROPONIN I: Troponin I: <0.03 ng/mL (ref <0.03)

## 2018-06-29 LAB — CBG MONITORING, ED: GLUCOSE-CAPILLARY: 116 mg/dL — AB (ref 70–99)

## 2018-06-29 MED ORDER — SODIUM CHLORIDE 0.9 % IV BOLUS
500.0000 mL | Freq: Once | INTRAVENOUS | Status: AC
  Administered 2018-06-29: 500 mL via INTRAVENOUS

## 2018-06-29 MED ORDER — ONDANSETRON HCL 4 MG/2ML IJ SOLN: 4.0000 mg | Freq: Four times a day (QID) | INTRAMUSCULAR | Status: DC | PRN

## 2018-06-29 MED ORDER — ENOXAPARIN SODIUM 40 MG/0.4ML ~~LOC~~ SOLN: 40.0000 mg | SUBCUTANEOUS | Status: DC

## 2018-06-29 MED ORDER — MECLIZINE HCL 12.5 MG PO TABS
25.0000 mg | ORAL_TABLET | Freq: Once | ORAL | Status: AC
  Administered 2018-06-29: 25 mg via ORAL
  Filled 2018-06-29: qty 2
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
## 2018-06-29 NOTE — ED Triage Notes (Signed)
Pt C/O dizziness and light headedness that began 1 week ago. Pt states he has thrown up from "being so swimmy headed." Pt states he has been "unsteady on his feet."
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
## 2018-06-30 ENCOUNTER — Observation Stay (HOSPITAL_COMMUNITY): Payer: Medicare Other

## 2018-06-30 DIAGNOSIS — R42 Dizziness and giddiness: Secondary | ICD-10-CM

## 2018-06-30 DIAGNOSIS — K5730 Diverticulosis of large intestine without perforation or abscess without bleeding: Secondary | ICD-10-CM | POA: Diagnosis not present

## 2018-06-30 DIAGNOSIS — K5909 Other constipation: Secondary | ICD-10-CM

## 2018-06-30 DIAGNOSIS — I10 Essential (primary) hypertension: Secondary | ICD-10-CM

## 2018-06-30 DIAGNOSIS — D649 Anemia, unspecified: Secondary | ICD-10-CM

## 2018-06-30 DIAGNOSIS — J0120 Acute ethmoidal sinusitis, unspecified: Secondary | ICD-10-CM

## 2018-06-30 DIAGNOSIS — I25118 Atherosclerotic heart disease of native coronary artery with other forms of angina pectoris: Secondary | ICD-10-CM

## 2018-06-30 DIAGNOSIS — R1084 Generalized abdominal pain: Secondary | ICD-10-CM | POA: Diagnosis not present

## 2018-06-30 DIAGNOSIS — J322 Chronic ethmoidal sinusitis: Secondary | ICD-10-CM

## 2018-06-30 DIAGNOSIS — I70209 Unspecified atherosclerosis of native arteries of extremities, unspecified extremity: Secondary | ICD-10-CM

## 2018-06-30 DIAGNOSIS — F419 Anxiety disorder, unspecified: Secondary | ICD-10-CM | POA: Diagnosis not present

## 2018-06-30 DIAGNOSIS — N390 Urinary tract infection, site not specified: Secondary | ICD-10-CM

## 2018-06-30 DIAGNOSIS — I480 Paroxysmal atrial fibrillation: Secondary | ICD-10-CM | POA: Diagnosis not present

## 2018-06-30 DIAGNOSIS — N5082 Scrotal pain: Secondary | ICD-10-CM

## 2018-06-30 DIAGNOSIS — N433 Hydrocele, unspecified: Secondary | ICD-10-CM | POA: Diagnosis not present

## 2018-06-30 DIAGNOSIS — G8929 Other chronic pain: Secondary | ICD-10-CM

## 2018-06-30 DIAGNOSIS — E1151 Type 2 diabetes mellitus with diabetic peripheral angiopathy without gangrene: Secondary | ICD-10-CM

## 2018-06-30 LAB — GLUCOSE, CAPILLARY
Glucose-Capillary: 160 mg/dL — ABNORMAL HIGH (ref 70–99)
Glucose-Capillary: 78 mg/dL (ref 70–99)
Glucose-Capillary: 85 mg/dL (ref 70–99)

## 2018-06-30 LAB — CBC
HCT: 34.6 % — ABNORMAL LOW (ref 39.0–52.0)
Hemoglobin: 11.4 g/dL — ABNORMAL LOW (ref 13.0–17.0)
MCH: 31 pg (ref 26.0–34.0)
MCHC: 32.9 g/dL (ref 30.0–36.0)
MCV: 94 fL (ref 80.0–100.0)
Platelets: 311 10*3/uL (ref 150–400)
RBC: 3.68 MIL/uL — ABNORMAL LOW (ref 4.22–5.81)
RDW: 13.7 % (ref 11.5–15.5)
WBC: 15.3 10*3/uL — ABNORMAL HIGH (ref 4.0–10.5)
nRBC: 0 % (ref 0.0–0.2)

## 2018-06-30 LAB — VITAMIN B12: Vitamin B-12: 137 pg/mL — ABNORMAL LOW (ref 180–914)

## 2018-06-30 MED ORDER — FENOFIBRATE 160 MG PO TABS
160.0000 mg | ORAL_TABLET | Freq: Every day | ORAL | Status: DC
  Administered 2018-06-30: 160 mg via ORAL
  Filled 2018-06-30: qty 1

## 2018-06-30 MED ORDER — IOPAMIDOL (ISOVUE-300) INJECTION 61%
100.0000 mL | Freq: Once | INTRAVENOUS | Status: AC | PRN
  Administered 2018-06-30: 100 mL via INTRAVENOUS

## 2018-06-30 MED ORDER — APIXABAN 5 MG PO TABS
5.0000 mg | ORAL_TABLET | Freq: Two times a day (BID) | ORAL | Status: DC
  Administered 2018-06-30: 5 mg via ORAL
  Filled 2018-06-30: qty 1

## 2018-06-30 MED ORDER — IOPAMIDOL (ISOVUE-300) INJECTION 61%
30.0000 mL | Freq: Once | INTRAVENOUS | Status: AC | PRN
  Administered 2018-06-30: 30 mL via ORAL

## 2018-06-30 MED ORDER — CEPHALEXIN 500 MG PO CAPS: 500.0000 mg | ORAL_CAPSULE | Freq: Three times a day (TID) | ORAL | 0 refills | Status: DC

## 2018-06-30 MED ORDER — PANTOPRAZOLE SODIUM 40 MG PO TBEC
40.0000 mg | DELAYED_RELEASE_TABLET | Freq: Two times a day (BID) | ORAL | Status: DC
  Administered 2018-06-30: 40 mg via ORAL
  Filled 2018-06-30: qty 1

## 2018-06-30 MED ORDER — PIPERACILLIN-TAZOBACTAM 3.375 G IVPB
3.3750 g | Freq: Once | INTRAVENOUS | Status: AC
  Administered 2018-06-30: 3.375 g via INTRAVENOUS

## 2018-06-30 MED ORDER — ACETAMINOPHEN 325 MG PO TABS: 650.0000 mg | ORAL_TABLET | Freq: Four times a day (QID) | ORAL | Status: DC | PRN

## 2018-06-30 MED ORDER — PIPERACILLIN-TAZOBACTAM 3.375 G IVPB
3.3750 g | Freq: Three times a day (TID) | INTRAVENOUS | Status: DC
  Administered 2018-06-30: 3.375 g via INTRAVENOUS
  Filled 2018-06-30: qty 50

## 2018-06-30 MED ORDER — MORPHINE SULFATE ER 30 MG PO TBCR
30.0000 mg | EXTENDED_RELEASE_TABLET | Freq: Two times a day (BID) | ORAL | Status: DC
  Administered 2018-06-30 (×2): 30 mg via ORAL
  Filled 2018-06-30 (×2): qty 1

## 2018-06-30 MED ORDER — POLYETHYLENE GLYCOL 3350 17 G PO PACK: 17.0000 g | PACK | Freq: Every day | ORAL | Status: DC | PRN

## 2018-06-30 MED ORDER — OXYCODONE-ACETAMINOPHEN 5-325 MG PO TABS
1.0000 | ORAL_TABLET | ORAL | Status: DC
  Administered 2018-06-30 (×2): 1 via ORAL
  Filled 2018-06-30 (×2): qty 1

## 2018-06-30 MED ORDER — DOXYCYCLINE HYCLATE 100 MG PO TABS
100.0000 mg | ORAL_TABLET | Freq: Two times a day (BID) | ORAL | Status: DC
  Administered 2018-06-30: 100 mg via ORAL
  Filled 2018-06-30: qty 1

## 2018-06-30 MED ORDER — ATORVASTATIN CALCIUM 20 MG PO TABS
20.0000 mg | ORAL_TABLET | Freq: Every day | ORAL | Status: DC
  Administered 2018-06-30: 20 mg via ORAL
  Filled 2018-06-30: qty 1

## 2018-06-30 MED ORDER — ALBUTEROL SULFATE (2.5 MG/3ML) 0.083% IN NEBU: 3.0000 mL | INHALATION_SOLUTION | Freq: Four times a day (QID) | RESPIRATORY_TRACT | Status: DC | PRN

## 2018-06-30 MED ORDER — CYANOCOBALAMIN 1000 MCG/ML IJ SOLN
1000.0000 ug | Freq: Every day | INTRAMUSCULAR | Status: DC
  Administered 2018-06-30: 1000 ug via INTRAMUSCULAR
  Filled 2018-06-30: qty 1

## 2018-06-30 MED ORDER — ISOSORBIDE MONONITRATE ER 60 MG PO TB24
30.0000 mg | ORAL_TABLET | Freq: Every day | ORAL | Status: DC
  Administered 2018-06-30: 30 mg via ORAL
  Filled 2018-06-30: qty 1

## 2018-06-30 MED ORDER — VITAMIN B-12 1000 MCG PO TABS: 1000.0000 ug | ORAL_TABLET | Freq: Every day | ORAL | 0 refills | Status: DC

## 2018-06-30 MED ORDER — SODIUM CHLORIDE 0.9 % IV SOLN
INTRAVENOUS | Status: AC
  Administered 2018-06-30: 03:00:00 via INTRAVENOUS

## 2018-06-30 MED ORDER — AMLODIPINE BESYLATE 5 MG PO TABS
10.0000 mg | ORAL_TABLET | Freq: Every day | ORAL | Status: DC
  Administered 2018-06-30: 10 mg via ORAL
  Filled 2018-06-30: qty 2

## 2018-06-30 MED ORDER — OXYCODONE HCL 5 MG PO TABS
5.0000 mg | ORAL_TABLET | ORAL | Status: DC
  Administered 2018-06-30 (×2): 5 mg via ORAL
  Filled 2018-06-30 (×2): qty 1

## 2018-06-30 MED ORDER — OXYCODONE-ACETAMINOPHEN 10-325 MG PO TABS: 1.0000 | ORAL_TABLET | Freq: Every day | ORAL | Status: DC

## 2018-06-30 MED ORDER — BISACODYL 10 MG RE SUPP: 10.0000 mg | RECTAL | Status: DC | PRN

## 2018-06-30 MED ORDER — CEPHALEXIN 500 MG PO CAPS
500.0000 mg | ORAL_CAPSULE | Freq: Three times a day (TID) | ORAL | Status: DC
  Administered 2018-06-30: 500 mg via ORAL
  Filled 2018-06-30: qty 1

## 2018-06-30 MED ORDER — DOXYCYCLINE HYCLATE 100 MG PO TABS: 100.0000 mg | ORAL_TABLET | Freq: Two times a day (BID) | ORAL | 0 refills | Status: DC

## 2018-06-30 MED ORDER — METOPROLOL TARTRATE 25 MG PO TABS
25.0000 mg | ORAL_TABLET | Freq: Two times a day (BID) | ORAL | Status: DC
  Administered 2018-06-30: 25 mg via ORAL
  Filled 2018-06-30: qty 1

## 2018-06-30 MED ORDER — DIAZEPAM 5 MG PO TABS: 10.0000 mg | ORAL_TABLET | Freq: Two times a day (BID) | ORAL | Status: DC | PRN

## 2018-06-30 NOTE — Progress Notes (Addendum)
Initial Nutrition Assessment  DOCUMENTATION CODES:      INTERVENTION:  Ensure Enlive po BID, each supplement provides 350 kcal and 20 grams of protein   Consider liberalized diet   NUTRITION DIAGNOSIS:   Inadequate oral intake related to decreased appetite(persisting lack of desire to eat (3-4 months) which coincides with unplanned wt loss pattern) as evidenced by percent weight loss, moderate fat depletion, mild fat depletion, mild muscle depletion.   GOAL:  Patient will meet greater than or equal to 90% of their needs   MONITOR:   PO intake, Supplement acceptance  REASON FOR ASSESSMENT:   Malnutrition Screening Tool    ASSESSMENT:  Patient is a 77 yo male with hx of CAD, MI, DM-2, GERD, Esophageal dysmotility, HTN and vitamin D deficiency.  History of malnutrition diagnosed from 2017.   Patient endorses poor appetite the past 3-4 months. A self -proclaimed picky eater at baseline. Feeds himself. Patient ate fairly well this morning and at lunch 50-75% of each meal. His granddaughter also brought in a tuna sub and chips for him this afternoon while RD was present.   His weight history shows loss ~ 6 kg (7%) the past 3 months which is trending toward significant. Patient is at increased risk for worsening  nutrition status/ dehydration given his unplanned wt loss and persistent lack of desire for food.  Medications reviewed and include: Vitamin B-12, Lipitor, lopressor, percocet, protonix and zosyn (sinusitis/UTI)     Labs:   B-12 was 137 (low), Albumin (low) BMP Latest Ref Rng & Units 06/29/2018 04/05/2018 11/30/2017  Glucose 70 - 99 mg/dL 122(H) 40(L) 98  BUN 8 - 23 mg/dL 16 16 15   Creatinine 0.61 - 1.24 mg/dL 0.82 0.94 0.86  BUN/Creat Ratio 10 - 24 - 17 17  Sodium 135 - 145 mmol/L 142 145(H) 145(H)  Potassium 3.5 - 5.1 mmol/L 3.8 4.1 4.4  Chloride 98 - 111 mmol/L 109 104 105  CO2 22 - 32 mmol/L 28 25 25   Calcium 8.9 - 10.3 mg/dL 7.9(L) 9.0 8.4(L)     NUTRITION -  FOCUSED PHYSICAL EXAM:    Most Recent Value  Orbital Region  Mild depletion  Upper Arm Region  Moderate depletion  Thoracic and Lumbar Region  Moderate depletion  Clavicle Bone Region  Mild depletion  Edema (RD Assessment)  None  Hair  Unable to assess [bald]  Eyes  Reviewed  Skin  Reviewed       Diet Order:   Diet Order            Diet heart healthy/carb modified Room service appropriate? Yes; Fluid consistency: Thin  Diet effective now              EDUCATION NEEDS:   Education needs have been addressed Skin:  Skin Assessment: Reviewed RN Assessment  Last BM:  unknown  Height:   Ht Readings from Last 1 Encounters:  06/30/18 5\' 11"  (1.803 m)    Weight:   Wt Readings from Last 1 Encounters:  06/30/18 84 kg    Ideal Body Weight:  78 kg  BMI:  Body mass index is 25.83 kg/m.  Estimated Nutritional Needs:   Kcal:  1900-2100   Protein:  92-99 gr  Fluid:  1.9-2.0 liters daily   Colman Cater MS,RD,CSG,LDN Office: [phone] Pager: [phone]

## 2018-06-30 NOTE — Progress Notes (Signed)
Spoke to Dr. Melburn Popper.  Stated it was okay for pharmacy to start Zosyn on patient, as patient had tolerated zosyn previously.  Dr. Marlowe Sax also stated she had spoke to pharmacist about medication dosage previously, and both had decided it was fine for patient to be given zosyn.

## 2018-06-30 NOTE — Care Management Obs Status (Signed)
[place] NOTIFICATION   Patient Details  Name: Dustin Dominguez MRN: [id number] Date of Birth: 29-Mar-1941   Medicare Observation Status Notification Given:  Yes    [name] 06/30/2018, 12:57 PM
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
## 2018-06-30 NOTE — Progress Notes (Signed)
Pt's IV catheter removed and intact. Pt's IV site clean dry and intact. Discharge instructions including medications and follow up appointments were reviewed and discussed with patient. All questions were answered and no further questions at this time. Pt in stable condition and in no acute distress at time of discharge. Pt will be escorted by nurse tech.  

## 2018-06-30 NOTE — Progress Notes (Signed)
Pt has refused CBG test at this time.

## 2018-06-30 NOTE — Evaluation (Signed)
Physical Therapy Evaluation Patient Details Name: Dustin Dominguez MRN: [id number] DOB: 21-May-1941 Today's Date: 06/30/2018   History of Present Illness  Dustin Dominguez is a 77 y.o. male with medical history significant of anxiety, BPH, chronic back pain, CAD, hypertension, GERD, peptic ulcer disease, hyperlipidemia, type 2 diabetes presenting to the hospital for evaluation of dizziness and gait instability.  Patient reports feeling dizzy when going from sitting to standing position for the past 1 week. Reports having a "staggering" gait.  Reports having a room spinning sensation and nausea.  Symptoms are worse when he moves his head.  States he has vomited a few times.  Denies noticing any change in his hearing or vision change.  He is not sure if he has been having fevers.  States the right side of his abdomen has been hurting and he has a big bulge in that area.  Reports having decreased p.o. intake.  Reports having chronic constipation; states his bowel last bowel movement was a week ago.  Denies having any chest pain.  Reports having a lot of clear nasal secretions.  Reports having pain in his scrotum for the past 4 to 5 days.  Denies having any dysuria or urinary frequency.  Reports having chronic urinary urgency.  No further history could be obtained from the patient.    Clinical Impression  Patient functioning near baseline for functional mobility and gait.  Plan:  Patient discharged from physical therapy to care of nursing for ambulation daily as tolerated for length of stay.    Follow Up Recommendations No PT follow up    Equipment Recommendations  None recommended by PT    Recommendations for Other Services       Precautions / Restrictions Precautions Precautions: None Restrictions Weight Bearing Restrictions: No      Mobility  Bed Mobility Overal bed mobility: Independent                Transfers Overall transfer level: Modified independent                General transfer comment: increased time  Ambulation/Gait Ambulation/Gait assistance: Modified independent (Device/Increase time) Gait Distance (Feet): 150 Feet Assistive device: None Gait Pattern/deviations: WFL(Within Functional Limits) Gait velocity: decreased   General Gait Details: grossly WFL, demonstrates good return for ambulation on level, inclined, and declined surfaces without loss of balance, had 1 stumble when steeping down steep decline and able to self correct without assistance  Stairs            Wheelchair Mobility    Modified Rankin (Stroke Patients Only)       Balance Overall balance assessment: No apparent balance deficits (not formally assessed)                                           Pertinent Vitals/Pain Pain Assessment: 0-10 Pain Score: 9  Pain Location: c/o severe low back pain when lying in bed, improved after walking, sitting up in chair Pain Descriptors / Indicators: Aching;Discomfort Pain Intervention(s): Limited activity within patient's tolerance;Monitored during session    Home Living Family/patient expects to be discharged to:: Private residence Living Arrangements: Spouse/significant other Available Help at Discharge: Family Type of Home: Mobile home Home Access: Stairs to enter Entrance Stairs-Rails: Right;Left;Can reach both Entrance Stairs-Number of Steps: 2 Home Layout: One level Home Equipment: Environmental consultant - 2 wheels;Shower seat;Wheelchair - Press photographer;Bedside  commode;Cane - single point      Prior Function Level of Independence: Independent with assistive device(s)         Comments: Household and short distanced community ambulator without AD, uses electric scooter for longer distances     Hand Dominance        Extremity/Trunk Assessment   Upper Extremity Assessment Upper Extremity Assessment: Overall WFL for tasks assessed    Lower Extremity Assessment Lower Extremity  Assessment: Overall WFL for tasks assessed    Cervical / Trunk Assessment Cervical / Trunk Assessment: Kyphotic  Communication   Communication: No difficulties  Cognition Arousal/Alertness: Awake/alert Behavior During Therapy: WFL for tasks assessed/performed Overall Cognitive Status: Within Functional Limits for tasks assessed                                        General Comments      Exercises     Assessment/Plan    PT Assessment Patent does not need any further PT services  PT Problem List         PT Treatment Interventions      PT Goals (Current goals can be found in the Care Plan section)  Acute Rehab PT Goals Patient Stated Goal: return home PT Goal Formulation: With patient Time For Goal Achievement: 06/30/18 Potential to Achieve Goals: Good    Frequency     Barriers to discharge        Co-evaluation               AM-PAC PT "6 Clicks" Mobility  Outcome Measure Help needed turning from your back to your side while in a flat bed without using bedrails?: None Help needed moving from lying on your back to sitting on the side of a flat bed without using bedrails?: None Help needed moving to and from a bed to a chair (including a wheelchair)?: None Help needed standing up from a chair using your arms (e.g., wheelchair or bedside chair)?: None Help needed to walk in hospital room?: None Help needed climbing 3-5 steps with a railing? : None 6 Click Score: 24    End of Session   Activity Tolerance: Patient tolerated treatment well Patient left: in chair;with call bell/phone within reach Nurse Communication: Mobility status PT Visit Diagnosis: Unsteadiness on feet (R26.81);Other abnormalities of gait and mobility (R26.89);Muscle weakness (generalized) (M62.81)    Time: 3329-5188 PT Time Calculation (min) (ACUTE ONLY): 22 min   Charges:   PT Evaluation $PT Eval Moderate Complexity: 1 Mod PT Treatments $Therapeutic Activity: 23-37  mins        3:05 PM, 06/30/18 Lonell Grandchild, MPT Physical Therapist with [facility] 336 [phone] office [phone] mobile phone

## 2018-06-30 NOTE — Progress Notes (Signed)
Pharmacy Antibiotic Note  Dustin Dominguez is a 77 y.o. male admitted on 06/29/2018 with UTI and sinusitis.  Pharmacy has been consulted for Zosyn dosing.  Plan: Zosyn 3.375g IV q8h (4 hour infusion).  Monitor labs, c/s, and patient improvement.  Height: 5\' 11"  (180.3 cm) Weight: 185 lb 3 oz (84 kg) IBW/kg (Calculated) : 75.3  Temp (24hrs), Avg:98.4 F (36.9 C), Min:98.3 F (36.8 C), Max:98.6 F (37 C)  Recent Labs  Lab 06/29/18 2026 06/30/18 0419  WBC 14.0* 15.3*  CREATININE 0.82  --     Estimated Creatinine Clearance: 82.9 mL/min (by C-G formula based on SCr of 0.82 mg/dL).    Allergies  Allergen Reactions  . Amitriptyline Other (See Comments)    sleepy  . Bextra [Valdecoxib] Other (See Comments)    unknown  . Codeine Nausea And Vomiting  . Cymbalta [Duloxetine Hcl] Swelling and Other (See Comments)    dizzy  . Esomeprazole Magnesium Diarrhea  . Niacin-Lovastatin Er Other (See Comments)    headache  . Niaspan [Niacin Er] Other (See Comments)    Increased headache  . Penicillins Rash    Has patient had a PCN reaction causing immediate rash, facial/tongue/throat swelling, SOB or lightheadedness with hypotension: Yes Has patient had a PCN reaction causing severe rash involving mucus membranes or skin necrosis: No Has patient had a PCN reaction that required hospitalization No Has patient had a PCN reaction occurring within the last 10 years: No Tolerated Zosyn 06/2016     Antimicrobials this admission: Zosyn 12/13 >>      Dose adjustments this admission: N/A  Microbiology results: 12/13 UCx: pending        Thank you for allowing pharmacy to be a part of this patient's care.  Ramond Craver 06/30/2018 7:44 AM

## 2018-06-30 NOTE — Discharge Summary (Signed)
Physician Discharge Summary  Dustin Dominguez QIH:[id number] DOB: 1940-10-30 DOA: 06/29/2018  PCP: Chipper Herb, MD  Admit date: 06/29/2018 Discharge date: 06/30/2018  Admitted From: Home Disposition: Home   Recommendations for Outpatient Follow-up:  1. Follow up with PCP in 1-2 weeks 2. Follow up with urology in the next week for continued surveillance of scrotal cellulitis due to his history of scrotal abscess.  3. Please obtain BMP/CBC in one week  Home Health: None recommended by PT Equipment/Devices: None new recommended Discharge Condition: Stable CODE STATUS: Full Diet recommendation: Heart healthy, carb-modified    Brief/Interim Summary: Dustin Dominguez is a 77 y.o. male with medical history significant of anxiety, BPH, chronic back pain, CAD, hypertension, GERD, peptic ulcer disease, hyperlipidemia, type 2 diabetes, scrotal abscess, vertigo, and right abdominal hernia who presented for evaluation of dizziness and gait instability. He reported about 1 week of dizziness worse with standing and moving the head described as room spinning, associated with nausea and occasional vomiting. He also ultimately mentioned that he has had 4-5 days of left scrotal pain and a spot on the scrotum that is tender. He reports a history of requiring hospitalization for scrotal abscess requiring drainage and is concerned of a recurrence.  Initial evaluation, vital signs stable, afebrile, nontoxic, with WBC 14, pyuria and bacteriuria on UA. CT head showed ethmoid sinusitis but no acute intracranial pathology. He continued to have an unsteady gait, so was brought in for observation, started on zosyn for treatment of UTI and sinusitis. Vitamin B12 level returned low at 137, so supplementation was started. The following day he was evaluated by physical therapy and reported complete resolution of vertiginous symptoms. MRI initially planned was not possible due to pt's neck pain and felt to be no longer  indicated with rapid resolution of symptoms. He's tolerating >75% of diet with no nausea. He denies feeling ill, has no fever and denies any acute urinary symptoms. Urine culture has not returned at time of discharge. Exam demonstrates a small area of induration superficially on the left side of the scrotum without associated fluctuance or discharge.   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 No evidence of central lesion on exam and symptoms resolved. More chronically may be due to b12 deficiency.  - PT evaluated the patient who had a normal gait, no PT follow up recommended.   B12 deficiency:  - Dietary consulted, supplement B12 1g daily.   Pyuria, bacteriuria: Without acute urinary symptoms, but with leukocytosis.  - Continue keflex as below pending urine culture  Scrotal cellulitis: In patient with abscess requiring surgery 2017. Note [place] 2018 demonstrated moderate complex left hydrocele which is noted on exam now. Repeat US demonstrated scrotal thickening, heterogenous echogenicity of bilateral testes consistent with prior inflammation/infection and hydrocele on left.  - Due to leukocytosis and high risk history, will cover for MRSA and strep with doxycycline and keflex, plan 10 day course.  - Emphasized the need for prompt follow up  with urology and/or PCP within the next 7 days to inform decisions regarding further management/antibiotic choice/duration.   Ethmoid sinusitis: No  significant symptoms from this noted on CT head, though would be treated with doxycycline as above.   Right abdominal wall laxity: Chronic, no incarcerated hernia on imaging, benign exam.   Chronic normocytic anemia, not otherwise specified: Hemoglobin 11.9, stable.  Baseline 11-12. Not megaloblastic as would be expected from B12 deficiency in isolation. ?if concomitant iron deficiency.  - Recommend iron studies as follow up.   Paroxysmal A. Fib: CHA2DS2VASc 7.  Currently in sinus rhythm.   - Continue home Eliquis, metoprolol.  Anxiety - Continue home valium as needed  Chronic pain - Continue home MS Contin 30 mg every 12 hours, scheduled percocet. Note these are significant risks when combined with benzodiazepine, so consider deescalation if able as outpatient.  GERD - Continue PPI  Chronic constipation - Continue bisacodyl suppository - MiraLAX as needed  Hypertension - Continue home metoprolol, imdur, amlodipine  Hyperlipidemia - Continue home lipitor, fenofibrate  Well-controlled type 2 diabetes: Not likely to be a risk factor for poor clearing of infection(s) with that level of control. Last A1c 5.2 in September 2019.  CAD: Stable.  No anginal symptoms.  Stat troponin negative and EKG not suggestive of ACS.   - Continue home metoprolol, Imdur.  Discharge Instructions Discharge Instructions    Diet - low sodium heart healthy   Complete by:  As directed    Diet Carb Modified   Complete by:  As directed    Discharge instructions   Complete by:  As directed    You were evaluated for vertigo which has resolved. You are walking normally and are stable for discharge. You were treated for a UTI, cellulitis of the scrotum, and possible sinusitis with antibiotics. It was also noted that you are very deficient in vitamin B12 which can cause neuropathy, trouble walking. You will need to take the following medications:  - Start taking B12 1016mcg every day and follow  up with your primary care doctor for continued management.  - Take a 10 day course of keflex 500mg  three times daily and doxycycline 100mg  twice daily to treat these infections.  - It is very important that you follow up with your urologist and/or PCP in the next week for reevaluation given your history with scrotal infections. If your symptoms worsen or return, seek medical attention right away.   Increase activity slowly   Complete by:  As directed      Allergies as of 06/30/2018      Reactions   Amitriptyline Other (See Comments)   sleepy   Bextra [valdecoxib] Other (See Comments)   unknown   Codeine Nausea And Vomiting   Cymbalta [duloxetine Hcl] Swelling, Other (See Comments)   dizzy   Esomeprazole Magnesium Diarrhea   Niacin-lovastatin Er Other (See Comments)   headache   Niaspan [niacin Er] Other (See Comments)   Increased headache   Penicillins Rash   Has patient had a PCN reaction causing immediate rash, facial/tongue/throat swelling, SOB or lightheadedness with hypotension: Yes Has patient had a PCN reaction causing severe rash involving mucus membranes or skin necrosis: No Has patient had a PCN reaction that required hospitalization No Has patient had a PCN reaction occurring within the last 10 years: No Tolerated Zosyn 06/2016      Medication List    TAKE these medications   albuterol 108 (90 Base) MCG/ACT inhaler Commonly known as:  PROVENTIL HFA;VENTOLIN  HFA Inhale 1-2 puffs into the lungs every 6 (six) hours as needed for wheezing.   amLODipine 10 MG tablet Commonly known as:  NORVASC TAKE ONE TABLET BY MOUTH EVERY DAY   atorvastatin 20 MG tablet Commonly known as:  LIPITOR Take 1 tablet (20 mg total) by mouth daily.   bacitracin ointment Apply 1 application topically 2 (two) times daily.   bisacodyl 10 MG suppository Commonly known as:  DULCOLAX Place 10 mg rectally as needed for mild constipation or moderate constipation.   cephALEXin 500 MG  capsule Commonly known as:  KEFLEX Take 1 capsule (500 mg total) by mouth every 8 (eight) hours.   diazepam 10 MG tablet Commonly known as:  VALIUM Take 1 tablet (10 mg total) by mouth 2 (two) times daily as needed.   doxycycline 100 MG tablet Commonly known as:  VIBRA-TABS Take 1 tablet (100 mg total) by mouth every 12 (twelve) hours.   ELIQUIS 5 MG Tabs tablet Generic drug:  apixaban TAKE 1 TABLET BY MOUTH TWICE DAILY   fenofibrate 160 MG tablet TAKE 1 TABLET BY MOUTH EVERY DAY   fish oil-omega-3 fatty acids 1000 MG capsule Take 1 g by mouth daily.   furosemide 20 MG tablet Commonly known as:  LASIX TAKE ONE TABLET BY MOUTH DAILY What changed:    how much to take  how to take this  when to take this   glimepiride 2 MG tablet Commonly known as:  AMARYL TAKE 1 TABLET BY MOUTH EVERY DAY DO not fill UNTIL asked   ibuprofen 400 MG tablet Commonly known as:  ADVIL,MOTRIN Take 1 tablet (400 mg total) by mouth every 6 (six) hours as needed.   isosorbide mononitrate 30 MG 24 hr tablet Commonly known as:  IMDUR TAKE ONE TABLET BY MOUTH EVERY DAY   lactulose 10 GM/15ML solution Commonly known as:  CHRONULAC Take 15 mLs (10 g total) by mouth daily as needed for mild constipation, moderate constipation or severe constipation. Reported on 11/11/2015   metoprolol tartrate 50 MG tablet Commonly known as:  LOPRESSOR TAKE 1/2 TABLET BY MOUTH TWICE DAILY   morphine 30 MG 12 hr tablet Commonly known as:  MS CONTIN Take 30 mg by mouth 3 (three) times daily.   pantoprazole 40 MG tablet Commonly known as:  PROTONIX TAKE ONE TABLET BY MOUTH TWICE DAILY   PERCOCET 10-325 MG tablet Generic drug:  oxyCODONE-acetaminophen Take 1 tablet by mouth 6 (six) times daily.   potassium chloride 10 MEQ tablet Commonly known as:  K-DUR,KLOR-CON TAKE ONE TABLET BY MOUTH DAILY   quinapril 20 MG tablet Commonly known as:  ACCUPRIL TAKE 1 TABLET BY MOUTH DAILY   vitamin B-12 1000 MCG  tablet Commonly known as:  CYANOCOBALAMIN Take 1 tablet (1,000 mcg total) by mouth daily.   Vitamin D (Ergocalciferol) 1.25 MG (50000 UT) Caps capsule Commonly known as:  DRISDOL TAKE ONE CAPSULE BY MOUTH ONCE A WEEK      Follow-up Information    Chipper Herb, MD. Schedule an appointment as soon as possible for a visit in 1 week(s).   Specialty:  Family Medicine Contact information: [place] [address] [phone]        Kathie Rhodes, MD. Schedule an appointment as soon as possible for a visit in 1 week(s).   Specialty:  Urology Contact information: [address] [place]  [id number] [phone]          Allergies  Allergen Reactions  .  Amitriptyline Other (See Comments)    sleepy  . Bextra [Valdecoxib] Other (See Comments)    unknown  . Codeine Nausea And Vomiting  . Cymbalta [Duloxetine Hcl] Swelling and Other (See Comments)    dizzy  . Esomeprazole Magnesium Diarrhea  . Niacin-Lovastatin Er Other (See Comments)    headache  . Niaspan [Niacin Er] Other (See Comments)    Increased headache  . Penicillins Rash    Has patient had a PCN reaction causing immediate rash, facial/tongue/throat swelling, SOB or lightheadedness with hypotension: Yes Has patient had a PCN reaction causing severe rash involving mucus membranes or skin necrosis: No Has patient had a PCN reaction that required hospitalization No Has patient had a PCN reaction occurring within the last 10 years: No Tolerated Zosyn 06/2016     Consultations:  None  Procedures/Studies: Ct Head Wo Contrast  Result Date: 06/29/2018 CLINICAL DATA:  Dizziness. EXAM: CT HEAD WITHOUT CONTRAST TECHNIQUE: Contiguous axial images were obtained from the base of the skull through the vertex without intravenous contrast. COMPARISON:  MRI of July 17, 2016. FINDINGS: Brain: No evidence of acute infarction, hemorrhage, hydrocephalus, extra-axial collection or mass lesion/mass effect.  Vascular: No hyperdense vessel or unexpected calcification. Skull: Normal. Negative for fracture or focal lesion. Sinuses/Orbits: Bilateral ethmoid sinusitis is noted. Other: None. IMPRESSION: Bilateral ethmoid sinusitis. No acute intracranial abnormality seen. Electronically Signed   By: Marijo Conception, M.D.   On: 06/29/2018 21:05   US Scrotum  Result Date: 06/30/2018 CLINICAL DATA:  Scrotal pain for 5 days. Prior history of scrotal abscess in 2017. EXAM: ULTRASOUND OF SCROTUM TECHNIQUE: Complete ultrasound examination of the testicles, epididymis, and other scrotal structures was performed. COMPARISON:  03/26/2017 and 07/15/2016 FINDINGS: Right testicle Measurements: 3.7 x 2.0 x 2.6 cm. Somewhat heterogeneous echogenicity but unchanged since prior exams. No testicular mass. Testicular microlithiasis again noted. Findings likely related to prior infection or inflammation. Patent intra testicular blood flow. Left testicle Measurements: 3.6 x 2.0 x 3.0 cm. Heterogeneous echogenicity and microlithiasis but no focal testicular lesion. Patent blood flow. Right epididymis:  Poor visualization of the right epididymis. Left epididymis:  Normal in size and appearance. Hydrocele:  Small left hydrocele. Varicocele:  None visualized. Other: Scrotal skin thickening. IMPRESSION: 1. 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 No mass. 2. Small left hydrocele. 3. Scrotal skin thickening. Electronically Signed   By: Marijo Sanes M.D.   On: 06/30/2018 15:18   Ct Abdomen Pelvis W Contrast  Result Date: 06/30/2018 CLINICAL DATA:  77 year old male with chronic back pain and scrotal pain. Abdominal distension. EXAM: CT ABDOMEN AND PELVIS WITH CONTRAST TECHNIQUE: Multidetector CT imaging of the abdomen and pelvis was performed using the standard protocol following bolus administration of intravenous contrast. CONTRAST:  95mL ISOVUE-300 IOPAMIDOL  (ISOVUE-300) INJECTION 61%, 157mL ISOVUE-300 IOPAMIDOL (ISOVUE-300) INJECTION 61% COMPARISON:  CT of the abdomen pelvis dated 07/07/2016 FINDINGS: Lower chest: The visualized lung bases are clear. No intra-abdominal free air or free fluid. Hepatobiliary: Linear coarse calcification through the right lobe of the liver similar to prior studies likely related to prior surgery or sequela of prior insult. Additional scattered small calcified granuloma. The liver is otherwise unremarkable. No intrahepatic biliary ductal dilatation. Cholecystectomy. Pancreas: Unremarkable. No pancreatic ductal dilatation or surrounding inflammatory changes. Spleen: Splenectomy. Adrenals/Urinary Tract: The left adrenal gland is unremarkable. The right adrenal gland is not visualized. There is a 4.5 cm left renal upper pole cyst. There is symmetric enhancement and excretion of  contrast by both kidneys. There is no hydronephrosis on either side. The visualized ureters appear unremarkable. The urinary bladder is only partially distended and grossly unremarkable. Stomach/Bowel: There is diffuse thickened appearance of the gastric rugal folds most likely related to underdistention. There are small scattered sigmoid diverticula without active inflammatory changes. There is moderate amount of stool throughout the colon. There is no bowel obstruction or active inflammation. Normal appendix. Vascular/Lymphatic: Moderate aortoiliac atherosclerotic disease. No portal venous gas. There is no adenopathy. Reproductive: The prostate and seminal vesicles are grossly unremarkable. Other: Small fat containing bilateral inguinal hernias, left greater right. No inflammatory changes or fluid collection. Small bilateral hydroceles noted. There is apparent laxity of the right anterolateral abdominal wall. Musculoskeletal: There is osteopenia with mild scoliosis and multilevel degenerative changes of the spine. No acute osseous pathology. IMPRESSION: 1. No acute  intra-abdominal or pelvic pathology. 2. Moderate colonic stool burden. No bowel obstruction or active inflammation. Normal appendix. 3. Scattered sigmoid diverticula without active inflammatory changes. Electronically Signed   By: Anner Crete M.D.   On: 06/30/2018 02:58   Subjective: Feels well, wants to go home today. Having no vertigo at rest or when walking with PT. Feels better. No fever.   Discharge Exam: Vitals:   06/30/18 0820 06/30/18 1332  BP:  128/68  Pulse: 67 (!) 55  Resp:  18  Temp:  99 F (37.2 C)  SpO2:  98%   General: Pt is alert, awake, not in acute distress Cardiovascular: RRR, S1/S2 +, no rubs, no gallops Respiratory: CTA bilaterally, no wheezing, no rhonchi Abdominal: Soft, NT, ND, bowel sounds + Extremities: No edema, no cyanosis Neuro: Alert, oriented, no CN deficits. No motor or sensory deficits in extremities. No nystagmus. GU: Normal uncircumcised penis. Hydrocele on left without tenderness. left inferior scrotum with superficial induration with mild tenderness, no discharge or fluctuance, and no significant erythema.  Labs: BNP (last 3 results) Recent Labs    08/02/17 1551  BNP 595.6*   Basic Metabolic Panel: Recent Labs  Lab 06/29/18 2026  NA 142  K 3.8  CL 109  CO2 28  GLUCOSE 122*  BUN 16  CREATININE 0.82  CALCIUM 7.9*   Liver Function Tests: No results for input(s): AST, ALT, ALKPHOS, BILITOT, PROT, ALBUMIN in the last 168 hours. No results for input(s): LIPASE, AMYLASE in the last 168 hours. No results for input(s): AMMONIA in the last 168 hours. CBC: Recent Labs  Lab 06/29/18 2026 06/30/18 0419  WBC 14.0* 15.3*  HGB 11.9* 11.4*  HCT 36.6* 34.6*  MCV 92.9 94.0  PLT 315 311   Cardiac Enzymes: Recent Labs  Lab 06/29/18 2026  TROPONINI <0.03   BNP: Invalid input(s): POCBNP CBG: Recent Labs  Lab 06/29/18 2016 06/30/18 0001 06/30/18 0732 06/30/18 1103  GLUCAP 116* 85 78 160*   D-Dimer No results for input(s):  DDIMER in the last 72 hours. Hgb A1c No results for input(s): HGBA1C in the last 72 hours. Lipid Profile No results for input(s): CHOL, HDL, LDLCALC, TRIG, CHOLHDL, LDLDIRECT in the last 72 hours. Thyroid function studies No results for input(s): TSH, T4TOTAL, T3FREE, THYROIDAB in the last 72 hours.  Invalid input(s): FREET3 Anemia work up Recent Labs    06/29/18 2026  VITAMINB12 137*   Urinalysis    Component Value Date/Time   COLORURINE YELLOW 06/29/2018 2140   APPEARANCEUR CLOUDY (A) 06/29/2018 2140   APPEARANCEUR Cloudy (A) 04/05/2018 1513   LABSPEC 1.018 06/29/2018 2140   PHURINE 6.0 06/29/2018 2140  GLUCOSEU NEGATIVE 06/29/2018 2140   HGBUR NEGATIVE 06/29/2018 2140   BILIRUBINUR NEGATIVE 06/29/2018 2140   BILIRUBINUR Negative 04/05/2018 [place] 06/29/2018 2140   PROTEINUR 30 (A) 06/29/2018 2140   NITRITE NEGATIVE 06/29/2018 2140   LEUKOCYTESUR LARGE (A) 06/29/2018 2140   LEUKOCYTESUR 1+ (A) 04/05/2018 1513    Microbiology No results found for this or any previous visit (from the past 240 hour(s)).  Time coordinating discharge: Approximately 40 minutes  Patrecia Pour, MD  [facility] 06/30/2018, 3:47 PM Pager [phone]

## 2018-07-02 LAB — URINE CULTURE: Culture: >=100000 — AB

## 2018-07-04 ENCOUNTER — Other Ambulatory Visit: Payer: Self-pay | Admitting: *Deleted

## 2018-07-04 ENCOUNTER — Encounter: Payer: Self-pay | Admitting: *Deleted

## 2018-07-04 MED ORDER — CEFDINIR 300 MG PO CAPS: 300.0000 mg | ORAL_CAPSULE | Freq: Two times a day (BID) | ORAL | 1 refills | Status: DC

## 2018-07-05 ENCOUNTER — Encounter (HOSPITAL_COMMUNITY): Payer: Self-pay

## 2018-07-05 ENCOUNTER — Emergency Department (HOSPITAL_COMMUNITY)
Admission: EM | Admit: 2018-07-05 | Discharge: 2018-07-06 | Disposition: A | Discharge status: Home / Self Care | Payer: Medicare Other | Source: Home / Self Care | Attending: Emergency Medicine | Admitting: Emergency Medicine

## 2018-07-05 DIAGNOSIS — I4891 Unspecified atrial fibrillation: Secondary | ICD-10-CM

## 2018-07-05 DIAGNOSIS — F17210 Nicotine dependence, cigarettes, uncomplicated: Secondary | ICD-10-CM | POA: Insufficient documentation

## 2018-07-05 DIAGNOSIS — E876 Hypokalemia: Secondary | ICD-10-CM | POA: Diagnosis not present

## 2018-07-05 DIAGNOSIS — I10 Essential (primary) hypertension: Secondary | ICD-10-CM | POA: Insufficient documentation

## 2018-07-05 DIAGNOSIS — N498 Inflammatory disorders of other specified male genital organs: Secondary | ICD-10-CM | POA: Diagnosis not present

## 2018-07-05 DIAGNOSIS — R42 Dizziness and giddiness: Secondary | ICD-10-CM

## 2018-07-05 DIAGNOSIS — E119 Type 2 diabetes mellitus without complications: Secondary | ICD-10-CM

## 2018-07-05 DIAGNOSIS — E785 Hyperlipidemia, unspecified: Secondary | ICD-10-CM | POA: Diagnosis not present

## 2018-07-05 DIAGNOSIS — I4892 Unspecified atrial flutter: Secondary | ICD-10-CM | POA: Diagnosis not present

## 2018-07-05 DIAGNOSIS — Z79899 Other long term (current) drug therapy: Secondary | ICD-10-CM

## 2018-07-05 DIAGNOSIS — R112 Nausea with vomiting, unspecified: Secondary | ICD-10-CM

## 2018-07-05 DIAGNOSIS — Z7901 Long term (current) use of anticoagulants: Secondary | ICD-10-CM

## 2018-07-05 DIAGNOSIS — I2510 Atherosclerotic heart disease of native coronary artery without angina pectoris: Secondary | ICD-10-CM | POA: Diagnosis not present

## 2018-07-05 DIAGNOSIS — G894 Chronic pain syndrome: Secondary | ICD-10-CM | POA: Diagnosis not present

## 2018-07-05 LAB — CBC WITH DIFFERENTIAL/PLATELET
Abs Immature Granulocytes: 0.05 10*3/uL (ref 0.00–0.07)
Basophils Absolute: 0.2 10*3/uL — ABNORMAL HIGH (ref 0.0–0.1)
Basophils Relative: 1 %
Eosinophils Absolute: 0.4 10*3/uL (ref 0.0–0.5)
Eosinophils Relative: 2 %
HCT: 38.4 % — ABNORMAL LOW (ref 39.0–52.0)
Hemoglobin: 12.7 g/dL — ABNORMAL LOW (ref 13.0–17.0)
Immature Granulocytes: 0 %
Lymphocytes Relative: 30 %
Lymphs Abs: 4.7 10*3/uL — ABNORMAL HIGH (ref 0.7–4.0)
MCH: 30.5 pg (ref 26.0–34.0)
MCHC: 33.1 g/dL (ref 30.0–36.0)
MCV: 92.1 fL (ref 80.0–100.0)
Monocytes Absolute: 1.1 10*3/uL — ABNORMAL HIGH (ref 0.1–1.0)
Monocytes Relative: 7 %
Neutro Abs: 9.2 10*3/uL — ABNORMAL HIGH (ref 1.7–7.7)
Neutrophils Relative %: 60 %
PLATELETS: 314 10*3/uL (ref 150–400)
RBC: 4.17 MIL/uL — ABNORMAL LOW (ref 4.22–5.81)
RDW: 13.5 % (ref 11.5–15.5)
WBC: 15.6 10*3/uL — ABNORMAL HIGH (ref 4.0–10.5)
nRBC: 0 % (ref 0.0–0.2)

## 2018-07-05 LAB — COMPREHENSIVE METABOLIC PANEL
ALBUMIN: 3.1 g/dL — AB (ref 3.5–5.0)
ALT: 18 U/L (ref 0–44)
AST: 27 U/L (ref 15–41)
Alkaline Phosphatase: 26 U/L — ABNORMAL LOW (ref 38–126)
Anion gap: 9 (ref 5–15)
BILIRUBIN TOTAL: 1.3 mg/dL — AB (ref 0.3–1.2)
BUN: 11 mg/dL (ref 8–23)
CO2: 24 mmol/L (ref 22–32)
Calcium: 7.6 mg/dL — ABNORMAL LOW (ref 8.9–10.3)
Chloride: 110 mmol/L (ref 98–111)
Creatinine, Ser: 0.78 mg/dL (ref 0.61–1.24)
GFR calc Af Amer: >60 mL/min (ref >60)
GFR calc non Af Amer: >60 mL/min (ref >60)
GLUCOSE: 87 mg/dL (ref 70–99)
Potassium: 3.4 mmol/L — ABNORMAL LOW (ref 3.5–5.1)
Sodium: 143 mmol/L (ref 135–145)
Total Protein: 6 g/dL — ABNORMAL LOW (ref 6.5–8.1)

## 2018-07-05 MED ORDER — MECLIZINE HCL 25 MG PO TABS
25.0000 mg | ORAL_TABLET | Freq: Once | ORAL | Status: AC
  Administered 2018-07-05: 25 mg via ORAL
  Filled 2018-07-05: qty 1

## 2018-07-05 MED ORDER — POTASSIUM CHLORIDE CRYS ER 20 MEQ PO TBCR
40.0000 meq | EXTENDED_RELEASE_TABLET | Freq: Once | ORAL | Status: AC
  Administered 2018-07-05: 40 meq via ORAL
  Filled 2018-07-05: qty 2

## 2018-07-05 MED ORDER — SODIUM CHLORIDE 0.9 % IV BOLUS
1000.0000 mL | Freq: Once | INTRAVENOUS | Status: AC
  Administered 2018-07-05: 1000 mL via INTRAVENOUS

## 2018-07-05 NOTE — ED Notes (Signed)
Patient ambulated to bathroom with standby assistance. Patient only c/o "slight dizziness".

## 2018-07-05 NOTE — ED Triage Notes (Addendum)
Pt c/o dizziness x 2 weeks. States he was seen at [facility] last week for the same symptoms. States when he got home the "dizzniess just got worse". Reports dizziness only when moving. Also c/o nausea, vomiting, and weakness. Denies chest pain, abdominal pain, fever, or dysuria.   Patient currently taking antibiotics for UTI.
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
## 2018-07-06 ENCOUNTER — Other Ambulatory Visit: Payer: Self-pay

## 2018-07-06 ENCOUNTER — Encounter (HOSPITAL_COMMUNITY): Payer: Self-pay

## 2018-07-06 ENCOUNTER — Emergency Department (HOSPITAL_COMMUNITY): Payer: Medicare Other

## 2018-07-06 ENCOUNTER — Observation Stay (HOSPITAL_COMMUNITY)
Admission: EM | Admit: 2018-07-06 | Discharge: 2018-07-08 | Disposition: A | Discharge status: Home / Self Care | Payer: Medicare Other | Attending: Internal Medicine | Admitting: Internal Medicine

## 2018-07-06 DIAGNOSIS — I4892 Unspecified atrial flutter: Secondary | ICD-10-CM | POA: Insufficient documentation

## 2018-07-06 DIAGNOSIS — D649 Anemia, unspecified: Secondary | ICD-10-CM | POA: Diagnosis present

## 2018-07-06 DIAGNOSIS — G8929 Other chronic pain: Secondary | ICD-10-CM | POA: Diagnosis present

## 2018-07-06 DIAGNOSIS — E785 Hyperlipidemia, unspecified: Secondary | ICD-10-CM | POA: Insufficient documentation

## 2018-07-06 DIAGNOSIS — G894 Chronic pain syndrome: Secondary | ICD-10-CM | POA: Insufficient documentation

## 2018-07-06 DIAGNOSIS — F17210 Nicotine dependence, cigarettes, uncomplicated: Secondary | ICD-10-CM | POA: Insufficient documentation

## 2018-07-06 DIAGNOSIS — F419 Anxiety disorder, unspecified: Secondary | ICD-10-CM | POA: Diagnosis present

## 2018-07-06 DIAGNOSIS — Z79899 Other long term (current) drug therapy: Secondary | ICD-10-CM | POA: Insufficient documentation

## 2018-07-06 DIAGNOSIS — R112 Nausea with vomiting, unspecified: Secondary | ICD-10-CM | POA: Diagnosis not present

## 2018-07-06 DIAGNOSIS — N498 Inflammatory disorders of other specified male genital organs: Secondary | ICD-10-CM | POA: Insufficient documentation

## 2018-07-06 DIAGNOSIS — I483 Typical atrial flutter: Secondary | ICD-10-CM

## 2018-07-06 DIAGNOSIS — E119 Type 2 diabetes mellitus without complications: Secondary | ICD-10-CM | POA: Insufficient documentation

## 2018-07-06 DIAGNOSIS — I480 Paroxysmal atrial fibrillation: Secondary | ICD-10-CM | POA: Diagnosis present

## 2018-07-06 DIAGNOSIS — I2510 Atherosclerotic heart disease of native coronary artery without angina pectoris: Secondary | ICD-10-CM | POA: Insufficient documentation

## 2018-07-06 DIAGNOSIS — I10 Essential (primary) hypertension: Secondary | ICD-10-CM | POA: Diagnosis not present

## 2018-07-06 DIAGNOSIS — R42 Dizziness and giddiness: Secondary | ICD-10-CM | POA: Diagnosis not present

## 2018-07-06 DIAGNOSIS — H8120 Vestibular neuronitis, unspecified ear: Secondary | ICD-10-CM

## 2018-07-06 DIAGNOSIS — R0689 Other abnormalities of breathing: Secondary | ICD-10-CM | POA: Diagnosis not present

## 2018-07-06 MED ORDER — MECLIZINE HCL 12.5 MG PO TABS: 12.5000 mg | ORAL_TABLET | Freq: Three times a day (TID) | ORAL | 0 refills | Status: DC | PRN

## 2018-07-06 MED ORDER — ONDANSETRON 4 MG PO TBDP: 4.0000 mg | ORAL_TABLET | Freq: Three times a day (TID) | ORAL | 0 refills | Status: DC | PRN

## 2018-07-06 NOTE — ED Triage Notes (Signed)
Pt has had vertigo/dizziness since last week.  Pt states he continues to be dizzy and nauseated and has not been able to keep his meds for vertigo down.

## 2018-07-06 NOTE — ED Notes (Signed)
Patient verbalizes understanding of medications and discharge instructions. No further questions at this time. VSS and patient ambulatory at discharge.   

## 2018-07-06 NOTE — ED Provider Notes (Signed)
Care assumed from Dr. Winfred Leeds.  Patient pending MRI to evaluate vertigo for the past week that is positional and associated with nausea and vomiting.  Recent admission for same.  MRI is negative for acute infarcts.  Cranial nerves II to XII intact, 5/5 strength throughout, no ataxia on finger-to-nose, no nystagmus.  Patient is able to walk without assistance.  Patient will be treated supportively for peripheral vertigo with meclizine and antiemetics.  Follow-up with neurology.  Return precautions discussed.  BP (!) 166/82 (BP Location: Left Arm)   Pulse 63   Temp 98.6 F (37 C) (Oral)   Resp 16   Ht 5\' 11"  (1.803 m)   Wt 90.7 kg   SpO2 97%   BMI 27.89 kg/m     Ezequiel Essex, MD 07/06/18 [id number]

## 2018-07-06 NOTE — ED Notes (Signed)
Patient ambulated around nurses station with steady gait (per his normal). No dizziness noted at this time.

## 2018-07-06 NOTE — Discharge Instructions (Addendum)
Your testing is negative for stroke.  Take the dizziness medicine as needed.  It may make you sleepy so do not take it if you are working or driving.  Follow-up with your doctor as well as the neurologist.  Return to the ED if you develop new or worsening symptoms.

## 2018-07-07 DIAGNOSIS — I10 Essential (primary) hypertension: Secondary | ICD-10-CM | POA: Diagnosis not present

## 2018-07-07 DIAGNOSIS — R42 Dizziness and giddiness: Secondary | ICD-10-CM

## 2018-07-07 DIAGNOSIS — G894 Chronic pain syndrome: Secondary | ICD-10-CM

## 2018-07-07 DIAGNOSIS — E782 Mixed hyperlipidemia: Secondary | ICD-10-CM

## 2018-07-07 DIAGNOSIS — I483 Typical atrial flutter: Secondary | ICD-10-CM

## 2018-07-07 LAB — CBC WITH DIFFERENTIAL/PLATELET
ABS IMMATURE GRANULOCYTES: 0.07 10*3/uL (ref 0.00–0.07)
BASOS PCT: 1 %
Basophils Absolute: 0.1 10*3/uL (ref 0.0–0.1)
Eosinophils Absolute: 0.2 10*3/uL (ref 0.0–0.5)
Eosinophils Relative: 1 %
HCT: 37.4 % — ABNORMAL LOW (ref 39.0–52.0)
Hemoglobin: 12.5 g/dL — ABNORMAL LOW (ref 13.0–17.0)
Immature Granulocytes: 0 %
Lymphocytes Relative: 30 %
Lymphs Abs: 5.2 10*3/uL — ABNORMAL HIGH (ref 0.7–4.0)
MCH: 30.6 pg (ref 26.0–34.0)
MCHC: 33.4 g/dL (ref 30.0–36.0)
MCV: 91.7 fL (ref 80.0–100.0)
Monocytes Absolute: 1.4 10*3/uL — ABNORMAL HIGH (ref 0.1–1.0)
Monocytes Relative: 8 %
Neutro Abs: 10.4 10*3/uL — ABNORMAL HIGH (ref 1.7–7.7)
Neutrophils Relative %: 60 %
PLATELETS: 294 10*3/uL (ref 150–400)
RBC: 4.08 MIL/uL — ABNORMAL LOW (ref 4.22–5.81)
RDW: 13.5 % (ref 11.5–15.5)
WBC: 17.4 10*3/uL — ABNORMAL HIGH (ref 4.0–10.5)
nRBC: 0 % (ref 0.0–0.2)

## 2018-07-07 LAB — T4, FREE: Free T4: 1.19 ng/dL (ref 0.82–1.77)

## 2018-07-07 LAB — RAPID URINE DRUG SCREEN, HOSP PERFORMED
Amphetamines: NOT DETECTED
Barbiturates: NOT DETECTED
Benzodiazepines: NOT DETECTED
Cocaine: NOT DETECTED
OPIATES: POSITIVE — AB
Tetrahydrocannabinol: NOT DETECTED

## 2018-07-07 LAB — URINALYSIS, COMPLETE (UACMP) WITH MICROSCOPIC
BACTERIA UA: NONE SEEN
Bilirubin Urine: NEGATIVE
Glucose, UA: NEGATIVE mg/dL
Hgb urine dipstick: NEGATIVE
Ketones, ur: NEGATIVE mg/dL
Leukocytes, UA: NEGATIVE
Nitrite: NEGATIVE
PROTEIN: 100 mg/dL — AB
Specific Gravity, Urine: 1.013 (ref 1.005–1.030)
pH: 6 (ref 5.0–8.0)

## 2018-07-07 LAB — COMPREHENSIVE METABOLIC PANEL
ALT: 20 U/L (ref 0–44)
AST: 27 U/L (ref 15–41)
Albumin: 3.1 g/dL — ABNORMAL LOW (ref 3.5–5.0)
Alkaline Phosphatase: 25 U/L — ABNORMAL LOW (ref 38–126)
Anion gap: 6 (ref 5–15)
BUN: 12 mg/dL (ref 8–23)
CO2: 24 mmol/L (ref 22–32)
Calcium: 7.1 mg/dL — ABNORMAL LOW (ref 8.9–10.3)
Chloride: 108 mmol/L (ref 98–111)
Creatinine, Ser: 0.74 mg/dL (ref 0.61–1.24)
GFR calc Af Amer: >60 mL/min (ref >60)
GFR calc non Af Amer: >60 mL/min (ref >60)
Glucose, Bld: 104 mg/dL — ABNORMAL HIGH (ref 70–99)
POTASSIUM: 3.6 mmol/L (ref 3.5–5.1)
Sodium: 138 mmol/L (ref 135–145)
Total Bilirubin: 1 mg/dL (ref 0.3–1.2)
Total Protein: 5.9 g/dL — ABNORMAL LOW (ref 6.5–8.1)

## 2018-07-07 LAB — VITAMIN B12: VITAMIN B 12: 363 pg/mL (ref 180–914)

## 2018-07-07 LAB — TSH: TSH: 0.604 u[IU]/mL (ref 0.350–4.500)

## 2018-07-07 MED ORDER — OXYCODONE-ACETAMINOPHEN 10-325 MG PO TABS: 1.0000 | ORAL_TABLET | Freq: Every day | ORAL | Status: DC

## 2018-07-07 MED ORDER — APIXABAN 5 MG PO TABS
5.0000 mg | ORAL_TABLET | Freq: Two times a day (BID) | ORAL | Status: DC
  Administered 2018-07-07 – 2018-07-08 (×3): 5 mg via ORAL
  Filled 2018-07-07 (×3): qty 1

## 2018-07-07 MED ORDER — LORAZEPAM 2 MG/ML IJ SOLN
0.5000 mg | Freq: Once | INTRAMUSCULAR | Status: AC
  Administered 2018-07-07: 0.5 mg via INTRAVENOUS
  Filled 2018-07-07: qty 1

## 2018-07-07 MED ORDER — MECLIZINE HCL 12.5 MG PO TABS
25.0000 mg | ORAL_TABLET | Freq: Once | ORAL | Status: AC
  Administered 2018-07-07: 25 mg via ORAL
  Filled 2018-07-07: qty 2

## 2018-07-07 MED ORDER — ISOSORBIDE MONONITRATE ER 30 MG PO TB24
30.0000 mg | ORAL_TABLET | Freq: Every day | ORAL | Status: DC
  Administered 2018-07-07 – 2018-07-08 (×2): 30 mg via ORAL
  Filled 2018-07-07 (×2): qty 1

## 2018-07-07 MED ORDER — ONDANSETRON 4 MG PO TBDP
4.0000 mg | ORAL_TABLET | Freq: Three times a day (TID) | ORAL | Status: DC | PRN
  Administered 2018-07-07 – 2018-07-08 (×2): 4 mg via ORAL
  Filled 2018-07-07 (×2): qty 1

## 2018-07-07 MED ORDER — ALBUTEROL SULFATE (2.5 MG/3ML) 0.083% IN NEBU: 3.0000 mL | INHALATION_SOLUTION | Freq: Four times a day (QID) | RESPIRATORY_TRACT | Status: DC | PRN

## 2018-07-07 MED ORDER — PREDNISONE 20 MG PO TABS
60.0000 mg | ORAL_TABLET | Freq: Every day | ORAL | Status: DC
  Administered 2018-07-07 – 2018-07-08 (×2): 60 mg via ORAL
  Filled 2018-07-07: qty 3

## 2018-07-07 MED ORDER — DIAZEPAM 5 MG PO TABS: 10.0000 mg | ORAL_TABLET | Freq: Two times a day (BID) | ORAL | Status: DC | PRN

## 2018-07-07 MED ORDER — MORPHINE SULFATE ER 30 MG PO TBCR
30.0000 mg | EXTENDED_RELEASE_TABLET | Freq: Three times a day (TID) | ORAL | Status: DC
  Administered 2018-07-07 – 2018-07-08 (×4): 30 mg via ORAL
  Filled 2018-07-07 (×4): qty 1

## 2018-07-07 MED ORDER — OMEGA-3 FATTY ACIDS 1000 MG PO CAPS: 1.0000 g | ORAL_CAPSULE | Freq: Every day | ORAL | Status: DC

## 2018-07-07 MED ORDER — AMLODIPINE BESYLATE 5 MG PO TABS
10.0000 mg | ORAL_TABLET | Freq: Every day | ORAL | Status: DC
  Administered 2018-07-07 – 2018-07-08 (×2): 10 mg via ORAL
  Filled 2018-07-07 (×2): qty 2

## 2018-07-07 MED ORDER — ATORVASTATIN CALCIUM 20 MG PO TABS
20.0000 mg | ORAL_TABLET | Freq: Every day | ORAL | Status: DC
  Filled 2018-07-07: qty 1

## 2018-07-07 MED ORDER — METOPROLOL TARTRATE 25 MG PO TABS
25.0000 mg | ORAL_TABLET | Freq: Two times a day (BID) | ORAL | Status: DC
  Administered 2018-07-07 – 2018-07-08 (×2): 25 mg via ORAL
  Filled 2018-07-07 (×3): qty 1

## 2018-07-07 MED ORDER — LACTULOSE 10 GM/15ML PO SOLN: 10.0000 g | Freq: Every day | ORAL | Status: DC | PRN

## 2018-07-07 MED ORDER — MECLIZINE HCL 12.5 MG PO TABS
50.0000 mg | ORAL_TABLET | Freq: Three times a day (TID) | ORAL | Status: DC | PRN
  Administered 2018-07-08: 50 mg via ORAL
  Filled 2018-07-07: qty 4

## 2018-07-07 MED ORDER — SODIUM CHLORIDE 0.9 % IV SOLN
INTRAVENOUS | Status: DC
  Administered 2018-07-07: 04:00:00 via INTRAVENOUS

## 2018-07-07 MED ORDER — AMLODIPINE BESYLATE 5 MG PO TABS: 10.0000 mg | ORAL_TABLET | Freq: Every day | ORAL | Status: DC

## 2018-07-07 MED ORDER — ATORVASTATIN CALCIUM 20 MG PO TABS
20.0000 mg | ORAL_TABLET | Freq: Every day | ORAL | Status: DC
  Administered 2018-07-07: 20 mg via ORAL
  Filled 2018-07-07 (×2): qty 1

## 2018-07-07 MED ORDER — ONDANSETRON 4 MG PO TBDP
4.0000 mg | ORAL_TABLET | Freq: Once | ORAL | Status: AC
  Administered 2018-07-07: 4 mg via ORAL
  Filled 2018-07-07: qty 1

## 2018-07-07 MED ORDER — OXYCODONE HCL 5 MG PO TABS
5.0000 mg | ORAL_TABLET | ORAL | Status: DC
  Administered 2018-07-07 – 2018-07-08 (×8): 5 mg via ORAL
  Filled 2018-07-07 (×8): qty 1

## 2018-07-07 MED ORDER — OXYCODONE-ACETAMINOPHEN 5-325 MG PO TABS
1.0000 | ORAL_TABLET | ORAL | Status: DC
  Administered 2018-07-07 – 2018-07-08 (×8): 1 via ORAL
  Filled 2018-07-07 (×8): qty 1

## 2018-07-07 MED ORDER — OMEGA-3-ACID ETHYL ESTERS 1 G PO CAPS
1.0000 g | ORAL_CAPSULE | Freq: Every day | ORAL | Status: DC
  Administered 2018-07-07 – 2018-07-08 (×2): 1 g via ORAL
  Filled 2018-07-07: qty 1
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
## 2018-07-07 NOTE — Care Management Note (Signed)
Case Management Note  Patient Details  Name: Dustin Dominguez MRN: [id number] Date of Birth: 1940-12-07  Subjective/Objective:      Vertigo. From home with wife. Has RW, BSC, WC, cane, electric scooter, shower seat at home. Independent.   Has PCP. No issues with transportation or obtaining medications.  Recommended for Home health PT for vestibular rehab. Patient elects [facility] vestibular rehab program.     Action/Plan: DC home with home health. [facility] notified and will obtain orders via Epic. Patient will need orders for home health with vestibular rehab noted in comments.   Expected Discharge Date:    07/08/2018              Expected Discharge Plan:  [place]  In-House Referral:     Discharge planning Services  CM Consult  Post Acute Care Choice:  Home Health Choice offered to:  Patient  DME Arranged:    DME Agency:     HH Arranged:  PT(vestibular rehab) [place]:  [place]  Status of Service:  Completed, signed off  If discussed at [place] of Stay Meetings, dates discussed:    Additional Comments:  Wm Sahagun, Chauncey Reading, RN 07/07/2018, 2:25 PM
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
## 2018-07-07 NOTE — Evaluation (Signed)
Physical Therapy Evaluation Patient Details Name: Dustin Dominguez MRN: [id number] DOB: 08/01/1940 Today's Date: 07/07/2018   History of Present Illness  Dustin Dominguez is a 77 y.o. male with medical history significant of chronic back pain, anxiety, vertigo comes in with 10 days of on and off vertiginous symptoms that are somewhat improved with meclizine 12 and have milligrams p.o. at home.  He is taking the meclizine only twice a day sometimes.  Patient is also had a MRI last week to make sure he is not had a posterior stroke which is been normal.  Patient comes in again tonight for vertigo again.  He was in the process of being discharged home when he insisted on being admitted.  He is been given meclizine in the emergency department.  He was able to ambulate to the bathroom but still got dizzy.  Patient is being referred for admission for vertigo.    Clinical Impression  Patient demonstrates slow slightly labored movement sitting up at side of gurney, c/o generalized weakness, but able to ambulate in hallway without loss of balance or need of AD.  Patient put back in gurney after therapy.  Patient will benefit from continued physical therapy in hospital and recommended venue below to increase strength, balance, endurance for safe ADLs and gait.    Follow Up Recommendations Home health PT;Supervision - Intermittent    Equipment Recommendations  None recommended by PT    Recommendations for Other Services       Precautions / Restrictions Precautions Precautions: None Restrictions Weight Bearing Restrictions: No      Mobility  Bed Mobility Overal bed mobility: Modified Independent             General bed mobility comments: increased time  Transfers Overall transfer level: Modified independent               General transfer comment: increased time  Ambulation/Gait Ambulation/Gait assistance: Supervision Gait Distance (Feet): 100 Feet Assistive device:  None Gait Pattern/deviations: Decreased step length - right;Decreased step length - left;Decreased stride length     General Gait Details: slightly labored cadence without loss of balance, limited secondary to c/o fatigue, dizziness and low back pain  Stairs            Wheelchair Mobility    Modified Rankin (Stroke Patients Only)       Balance Overall balance assessment: Mild deficits observed, not formally tested                                           Pertinent Vitals/Pain Pain Assessment: 0-10 Pain Score: 9  Pain Location: chronic low back Pain Descriptors / Indicators: Aching;Discomfort Pain Intervention(s): Limited activity within patient's tolerance;Monitored during session    Home Living Family/patient expects to be discharged to:: Private residence Living Arrangements: Spouse/significant other Available Help at Discharge: Family Type of Home: Mobile home Home Access: Stairs to enter Entrance Stairs-Rails: Right;Left;Can reach both Entrance Stairs-Number of Steps: 2 Home Layout: One level Home Equipment: Environmental consultant - 2 wheels;Shower seat;Wheelchair - Press photographer;Bedside commode;Cane - single point      Prior Function           Comments: Household and short distanced community ambulator without AD, uses electric scooter for longer distances     Hand Dominance        Extremity/Trunk Assessment   Upper Extremity Assessment Upper Extremity  Assessment: Generalized weakness    Lower Extremity Assessment Lower Extremity Assessment: Generalized weakness    Cervical / Trunk Assessment Cervical / Trunk Assessment: Kyphotic  Communication   Communication: No difficulties  Cognition Arousal/Alertness: Awake/alert Behavior During Therapy: WFL for tasks assessed/performed Overall Cognitive Status: Within Functional Limits for tasks assessed                                        General Comments       Exercises     Assessment/Plan    PT Assessment Patient needs continued PT services  PT Problem List Decreased strength;Decreased activity tolerance;Decreased balance;Decreased mobility       PT Treatment Interventions Gait training;Stair training;Functional mobility training;Therapeutic activities;Therapeutic exercise;Patient/family education    PT Goals (Current goals can be found in the Care Plan section)  Acute Rehab PT Goals Patient Stated Goal: return home without dizziness PT Goal Formulation: With patient Time For Goal Achievement: 07/14/18 Potential to Achieve Goals: Fair    Frequency Min 3X/week   Barriers to discharge        Co-evaluation               AM-PAC PT "6 Clicks" Mobility  Outcome Measure Help needed turning from your back to your side while in a flat bed without using bedrails?: None Help needed moving from lying on your back to sitting on the side of a flat bed without using bedrails?: None Help needed moving to and from a bed to a chair (including a wheelchair)?: A Little Help needed standing up from a chair using your arms (e.g., wheelchair or bedside chair)?: A Little Help needed to walk in hospital room?: A Little Help needed climbing 3-5 steps with a railing? : A Little 6 Click Score: 20    End of Session Equipment Utilized During Treatment: Gait belt Activity Tolerance: Patient tolerated treatment well;Patient limited by fatigue(Patient limited by dizziness) Patient left: in bed;with call bell/phone within reach Nurse Communication: Mobility status PT Visit Diagnosis: Unsteadiness on feet (R26.81);Other abnormalities of gait and mobility (R26.89);Muscle weakness (generalized) (M62.81)    Time: 0388-8280 PT Time Calculation (min) (ACUTE ONLY): 28 min   Charges:   PT Evaluation $PT Eval Moderate Complexity: 1 Mod PT Treatments $Therapeutic Activity: 23-37 mins        1:52 PM, 07/07/18 Lonell Grandchild, MPT Physical Therapist  with [facility] 336 [phone] office [phone] mobile phone

## 2018-07-07 NOTE — Plan of Care (Signed)
  Problem: Acute Rehab PT Goals(only PT should resolve) Goal: Pt Will Go Supine/Side To Sit Outcome: Progressing Flowsheets (Taken 07/07/2018 1354) Pt will go Supine/Side to Sit: Independently Goal: Patient Will Transfer Sit To/From Stand Outcome: Progressing Flowsheets (Taken 07/07/2018 1354) Patient will transfer sit to/from stand: Independently Goal: Pt Will Transfer Bed To Chair/Chair To Bed Outcome: Progressing Flowsheets (Taken 07/07/2018 1354) Pt will Transfer Bed to Chair/Chair to Bed: Independently Goal: Pt Will Ambulate Outcome: Progressing Flowsheets (Taken 07/07/2018 1354) Pt will Ambulate: > 125 feet; with modified independence   1:55 PM, 07/07/18 Lonell Grandchild, MPT Physical Therapist with [facility] [phone] office [phone] mobile phone

## 2018-07-07 NOTE — Progress Notes (Addendum)
PROGRESS NOTE  Dustin Dominguez AUQ:[id number] DOB: October 11, 1940 DOA: 07/06/2018 PCP: Chipper Herb, MD  Brief History:  77 year old male with a history of anxiety, chronic pain syndrome, BPH, pulmonary disease, hypertension, GERD, diabetes mellitus, atrial fibrillation on apixaban presenting with 10-day history of dizziness, gait instability, and vertigo.  The patient was recently mated to the hospital on 06/29/2018 through 06/30/2018 during which time the patient was treated for scrotal cellulitis.  The patient represented back to the emergency department 07/05/2018 because of dizziness.  MRI of the brain at that time was negative for any acute findings.  The patient was prescribed meclizine.  He stated that it did not help very much, but he only sporadically took it.  He denies any other new medications other than the antibiotics he was recently placed on.  He denies any focal deficits, visual disturbance, or syncope.  There is no dysuria, hematuria, fevers, chills, chest pain, worsening shortness of breath, nausea, vomiting, diarrhea.  Because of his vertigo, the patient was admitted for further evaluation.  Assessment/Plan: Vertigo -Likely vestibular neuritis which may have been partly exacerbated by the patient's recent doxycycline and dehydration both of which may have worsened his dizziness. 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 as there is a lot of scarring on examination -Physical therapy eval -Continue meclizine -Check orthostatics -Continue IV fluids -Start empiric steroids -07/05/2018 MRI brain negative for acute findings -Repeat serum B12 level -TSH -Urinalysis -Urine drug screen -Patient has positive Dix-Hallpike maneuver  Diabetes mellitus type 2 -Holding Amaryl -04/05/2018 hemoglobin A1c 5.2  Essential hypertension -Restart quinapril, amlodipine  Atrial flutter, atypical -Rate controlled -Restart  metoprolol tartrate -Restart apixaban  Chronic pain syndrome -Continue home doses of Valium, MS Contin, Percocet  Coronary artery disease -No chest pain presently -Personally reviewed EKG--sinus rhythm, first-degree AV block -Continue Imdur  Scrotal cellulitis -The patient was discharged home with doxycycline and on 06/30/2018 -He has finished 1 week of antibiotics -No signs of persistent infection on examination -Hold off starting antibiotics at this time  Mixed hyperlipidemia -Continue Lipitor   Disposition Plan:   Home 12/21 if stable Family Communication:  No Family at bedside  Consultants:  none  Code Status:  FULL   DVT Prophylaxis:  apixaban   Procedures: As Listed in Progress Note Above  Antibiotics: None  Total time spent 40 minutes.  Greater than 50% spent face to face counseling and coordinating care. 0720 to 0800     Subjective: Patient still feels dizzy when he gets up or turns his head.  He denies any chest pain, shortness breath, nausea, vomiting, diarrhea, abdominal pain.  There is no dysuria, hematuria.  There is no visual disturbance.  There is no focal extremity weakness.  Objective: Vitals:   07/07/18 0400 07/07/18 0430 07/07/18 0500 07/07/18 0630  BP: (!) 164/66 (!) 156/65 (!) 148/60 (!) 136/59  Pulse: (!) 56 (!) 56 (!) 54 (!) 49  Resp: (!) 22 (!) 25 (!) 21 20  Temp:      TempSrc:      SpO2: 97% 96% 96% 97%  Weight:      Height:       No intake or output data in the 24 hours ending 07/07/18 0755 Weight change:  Exam:   General:  Pt is alert, follows commands appropriately, not in acute distress  HEENT: No icterus, No thrush, No neck mass, [place]/AT  Cardiovascular: RRR,  S1/S2, no rubs, no gallops  Respiratory: CTA bilaterally, no wheezing, no crackles, no rhonchi  Abdomen: Soft/+BS, non tender, non distended, no guarding  Extremities: No edema, No lymphangitis, No petechiae, No rashes, no synovitis  Neuro:  CN II-XII intact,  strength 4/5 in RUE, RLE, strength 4/5 LUE, LLE; sensation intact bilateral; no dysmetria; babinski equivocal     Data Reviewed: I have personally reviewed following labs and imaging studies Basic Metabolic Panel: Recent Labs  Lab 07/05/18 2114 07/07/18 0045  NA 143 138  K 3.4* 3.6  CL 110 108  CO2 24 24  GLUCOSE 87 104*  BUN 11 12  CREATININE 0.78 0.74  CALCIUM 7.6* 7.1*   Liver Function Tests: Recent Labs  Lab 07/05/18 2114 07/07/18 0045  AST 27 27  ALT 18 20  ALKPHOS 26* 25*  BILITOT 1.3* 1.0  PROT 6.0* 5.9*  ALBUMIN 3.1* 3.1*   No results for input(s): LIPASE, AMYLASE in the last 168 hours. No results for input(s): AMMONIA in the last 168 hours. Coagulation Profile: No results for input(s): INR, PROTIME in the last 168 hours. CBC: Recent Labs  Lab 07/05/18 2114 07/07/18 0045  WBC 15.6* 17.4*  NEUTROABS 9.2* 10.4*  HGB 12.7* 12.5*  HCT 38.4* 37.4*  MCV 92.1 91.7  PLT 314 294   Cardiac Enzymes: No results for input(s): CKTOTAL, CKMB, CKMBINDEX, TROPONINI in the last 168 hours. BNP: Invalid input(s): POCBNP CBG: Recent Labs  Lab 06/30/18 1103  GLUCAP 160*   HbA1C: No results for input(s): HGBA1C in the last 72 hours. Urine analysis:    Component Value Date/Time   COLORURINE YELLOW 06/29/2018 2140   APPEARANCEUR CLOUDY (A) 06/29/2018 2140   APPEARANCEUR Cloudy (A) 04/05/2018 1513   LABSPEC 1.018 06/29/2018 2140   PHURINE 6.0 06/29/2018 2140   GLUCOSEU NEGATIVE 06/29/2018 2140   HGBUR NEGATIVE 06/29/2018 2140   BILIRUBINUR NEGATIVE 06/29/2018 2140   BILIRUBINUR Negative 04/05/2018 1513   KETONESUR NEGATIVE 06/29/2018 2140   PROTEINUR 30 (A) 06/29/2018 2140   NITRITE NEGATIVE 06/29/2018 2140   LEUKOCYTESUR LARGE (A) 06/29/2018 2140   LEUKOCYTESUR 1+ (A) 04/05/2018 1513   Sepsis Labs: @LABRCNTIP (procalcitonin:4,lacticidven:4) ) Recent Results (from the past 240 hour(s))  Culture, Urine     Status: Abnormal   Collection Time: 06/30/18  12:12 AM  Result Value Ref Range Status   Specimen Description   Final    URINE, CLEAN CATCH Performed at [facility], [address], [place], [place] [address]    Special Requests   Final    NONE Performed at [facility], [address] [place], [place] [address]    Culture >=100,000 COLONIES/mL ESCHERICHIA COLI (A)  Final   Report Status 07/02/2018 FINAL  Final   Organism ID, Bacteria ESCHERICHIA COLI (A)  Final      Susceptibility   Escherichia coli - MIC*    AMPICILLIN >=32 RESISTANT Resistant     CEFAZOLIN 8 SENSITIVE Sensitive     CEFTRIAXONE <=1 SENSITIVE Sensitive     CIPROFLOXACIN >=4 RESISTANT Resistant     GENTAMICIN <=1 SENSITIVE Sensitive     IMIPENEM <=0.25 SENSITIVE Sensitive     NITROFURANTOIN >=512 RESISTANT Resistant     TRIMETH/SULFA >=320 RESISTANT Resistant     AMPICILLIN/SULBACTAM >=32 RESISTANT Resistant     PIP/TAZO <=4 SENSITIVE Sensitive     Extended ESBL NEGATIVE Sensitive     * >=100,000 COLONIES/mL ESCHERICHIA COLI     Scheduled Meds: . amLODipine  10 mg Oral Daily  . atorvastatin  20 mg Oral Daily  . fish oil-omega-3 fatty acids  1 g Oral Daily  . morphine  30 mg Oral Q8H  . oxyCODONE-acetaminophen  1 tablet Oral Q4H   And  . oxyCODONE  5 mg Oral Q4H   Continuous Infusions: . sodium chloride 75 mL/hr at 07/07/18 0341    Procedures/Studies: Ct Head Wo Contrast  Result Date: 06/29/2018 CLINICAL DATA:  Dizziness. EXAM: CT HEAD WITHOUT CONTRAST TECHNIQUE: Contiguous axial images were obtained from the base of the skull through the vertex without intravenous contrast. COMPARISON:  MRI of July 17, 2016. FINDINGS: Brain: No evidence of acute infarction, hemorrhage, hydrocephalus, extra-axial collection or mass lesion/mass effect. Vascular: No hyperdense vessel or unexpected calcification. Skull: Normal. Negative for fracture or focal lesion. Sinuses/Orbits: Bilateral ethmoid sinusitis is noted. Other: None. IMPRESSION: Bilateral ethmoid  sinusitis. No acute intracranial abnormality seen. Electronically Signed   By: Marijo Conception, M.D.   On: 06/29/2018 21:05   Mr Brain Wo Contrast  Result Date: 07/06/2018 CLINICAL DATA:  Dizziness EXAM: MRI HEAD WITHOUT CONTRAST TECHNIQUE: Multiplanar, multiecho pulse sequences of the brain and surrounding structures were obtained without intravenous contrast. COMPARISON:  None. FINDINGS: BRAIN: There is no acute infarct, acute hemorrhage, hydrocephalus or extra-axial collection. The midline structures are normal. No midline shift or other mass effect. There are no old infarcts. Multifocal white matter hyperintensity, most commonly due to chronic ischemic microangiopathy. The cerebral and cerebellar volume are age-appropriate. Susceptibility-sensitive sequences show no chronic microhemorrhage or superficial siderosis. VASCULAR: Major intracranial arterial and venous sinus flow voids are normal. SKULL AND UPPER CERVICAL SPINE: Calvarial bone marrow signal is normal. There is no skull base mass. Visualized upper cervical spine and soft tissues are normal. SINUSES/ORBITS: No fluid levels or advanced mucosal thickening. No mastoid or middle ear effusion. The orbits are normal. IMPRESSION: 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 Electronically Signed   By: Ulyses Jarred M.D.   On: 07/06/2018 02:01   US Scrotum  Result Date: 06/30/2018 CLINICAL DATA:  Scrotal pain for 5 days. Prior history of scrotal abscess in 2017. EXAM: ULTRASOUND OF SCROTUM TECHNIQUE: Complete ultrasound examination of the testicles, epididymis, and other scrotal structures was performed. COMPARISON:  03/26/2017 and 07/15/2016 FINDINGS: Right testicle Measurements: 3.7 x 2.0 x 2.6 cm. Somewhat heterogeneous echogenicity but unchanged since prior exams. No testicular mass. Testicular microlithiasis again noted. Findings likely related to prior infection or inflammation. Patent intra testicular blood flow.  Left testicle Measurements: 3.6 x 2.0 x 3.0 cm. Heterogeneous echogenicity and microlithiasis but no focal testicular lesion. Patent blood flow. Right epididymis:  Poor visualization of the right epididymis. Left epididymis:  Normal in size and appearance. Hydrocele:  Small left hydrocele. Varicocele:  None visualized. Other: Scrotal skin thickening. IMPRESSION: 1. 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 No mass. 2. Small left hydrocele. 3. Scrotal skin thickening. Electronically Signed   By: Marijo Sanes M.D.   On: 06/30/2018 15:18   Ct Abdomen Pelvis W Contrast  Result Date: 06/30/2018 CLINICAL DATA:  77 year old male with chronic back pain and scrotal pain. Abdominal distension. EXAM: CT ABDOMEN AND PELVIS WITH CONTRAST TECHNIQUE: Multidetector CT imaging of the abdomen and pelvis was performed using the standard protocol following bolus administration of intravenous contrast. CONTRAST:  29mL ISOVUE-300 IOPAMIDOL (ISOVUE-300) INJECTION 61%, 171mL ISOVUE-300 IOPAMIDOL (ISOVUE-300) INJECTION 61% COMPARISON:  CT of the abdomen pelvis dated 07/07/2016 FINDINGS: Lower chest: The visualized lung bases are clear. No intra-abdominal free  air or free fluid. Hepatobiliary: Linear coarse calcification through the right lobe of the liver similar to prior studies likely related to prior surgery or sequela of prior insult. Additional scattered small calcified granuloma. The liver is otherwise unremarkable. No intrahepatic biliary ductal dilatation. Cholecystectomy. Pancreas: Unremarkable. No pancreatic ductal dilatation or surrounding inflammatory changes. Spleen: Splenectomy. Adrenals/Urinary Tract: The left adrenal gland is unremarkable. The right adrenal gland is not visualized. There is a 4.5 cm left renal upper pole cyst. There is symmetric enhancement and excretion of contrast by both kidneys. There is no hydronephrosis on either  side. The visualized ureters appear unremarkable. The urinary bladder is only partially distended and grossly unremarkable. Stomach/Bowel: There is diffuse thickened appearance of the gastric rugal folds most likely related to underdistention. There are small scattered sigmoid diverticula without active inflammatory changes. There is moderate amount of stool throughout the colon. There is no bowel obstruction or active inflammation. Normal appendix. Vascular/Lymphatic: Moderate aortoiliac atherosclerotic disease. No portal venous gas. There is no adenopathy. Reproductive: The prostate and seminal vesicles are grossly unremarkable. Other: Small fat containing bilateral inguinal hernias, left greater right. No inflammatory changes or fluid collection. Small bilateral hydroceles noted. There is apparent laxity of the right anterolateral abdominal wall. Musculoskeletal: There is osteopenia with mild scoliosis and multilevel degenerative changes of the spine. No acute osseous pathology. IMPRESSION: 1. No acute intra-abdominal or pelvic pathology. 2. Moderate colonic stool burden. No bowel obstruction or active inflammation. Normal appendix. 3. Scattered sigmoid diverticula without active inflammatory changes. Electronically Signed   By: Anner Crete M.D.   On: 06/30/2018 02:58    Orson Eva, DO  [facility] Pager [phone]  If 7PM-7AM, please contact night-coverage [email or web address] Password [email or web address] 07/07/2018, 7:55 AM   LOS: 0 days

## 2018-07-07 NOTE — Care Management Obs Status (Signed)
[place] NOTIFICATION   Patient Details  Name: Dustin Dominguez MRN: [id number] Date of Birth: March 26, 1941   Medicare Observation Status Notification Given:  Yes    [name] 07/07/2018, 12:21 PM

## 2018-07-08 DIAGNOSIS — I10 Essential (primary) hypertension: Secondary | ICD-10-CM | POA: Diagnosis not present

## 2018-07-08 DIAGNOSIS — I483 Typical atrial flutter: Secondary | ICD-10-CM | POA: Diagnosis not present

## 2018-07-08 DIAGNOSIS — H8120 Vestibular neuronitis, unspecified ear: Secondary | ICD-10-CM | POA: Diagnosis not present

## 2018-07-08 DIAGNOSIS — R42 Dizziness and giddiness: Secondary | ICD-10-CM | POA: Diagnosis not present

## 2018-07-08 DIAGNOSIS — I480 Paroxysmal atrial fibrillation: Secondary | ICD-10-CM

## 2018-07-08 LAB — CBC
HCT: 34.9 % — ABNORMAL LOW (ref 39.0–52.0)
Hemoglobin: 11.4 g/dL — ABNORMAL LOW (ref 13.0–17.0)
MCH: 30.2 pg (ref 26.0–34.0)
MCHC: 32.7 g/dL (ref 30.0–36.0)
MCV: 92.6 fL (ref 80.0–100.0)
NRBC: 0 % (ref 0.0–0.2)
Platelets: 276 10*3/uL (ref 150–400)
RBC: 3.77 MIL/uL — ABNORMAL LOW (ref 4.22–5.81)
RDW: 13.6 % (ref 11.5–15.5)
WBC: 15.8 10*3/uL — ABNORMAL HIGH (ref 4.0–10.5)

## 2018-07-08 LAB — BASIC METABOLIC PANEL
Anion gap: 4 — ABNORMAL LOW (ref 5–15)
BUN: 17 mg/dL (ref 8–23)
CO2: 26 mmol/L (ref 22–32)
Calcium: 6.8 mg/dL — ABNORMAL LOW (ref 8.9–10.3)
Chloride: 111 mmol/L (ref 98–111)
Creatinine, Ser: 0.79 mg/dL (ref 0.61–1.24)
GFR calc Af Amer: >60 mL/min (ref >60)
GFR calc non Af Amer: >60 mL/min (ref >60)
Glucose, Bld: 112 mg/dL — ABNORMAL HIGH (ref 70–99)
POTASSIUM: 3.4 mmol/L — AB (ref 3.5–5.1)
Sodium: 141 mmol/L (ref 135–145)

## 2018-07-08 LAB — URINE CULTURE: Culture: NO GROWTH

## 2018-07-08 MED ORDER — PREDNISONE 10 MG PO TABS: 60.0000 mg | ORAL_TABLET | Freq: Every day | ORAL | 0 refills | Status: DC

## 2018-07-08 MED ORDER — FUROSEMIDE 20 MG PO TABS: 20.0000 mg | ORAL_TABLET | Freq: Every day | ORAL | 3 refills | Status: DC

## 2018-07-08 MED ORDER — MECLIZINE HCL 25 MG PO TABS: 50.0000 mg | ORAL_TABLET | Freq: Three times a day (TID) | ORAL | 0 refills | Status: DC | PRN

## 2018-07-08 MED ORDER — POTASSIUM CHLORIDE CRYS ER 20 MEQ PO TBCR
20.0000 meq | EXTENDED_RELEASE_TABLET | Freq: Once | ORAL | Status: AC
  Administered 2018-07-08: 20 meq via ORAL
  Filled 2018-07-08: qty 1

## 2018-07-08 NOTE — Care Management Note (Signed)
Case Management Note  Patient Details  Name: Dustin Dominguez MRN: [id number] Date of Birth: [birth year]  Subjective/Objective:    Previous RNCM set up home health with [facility]. Referral confirmed with jermaine from [facility] of discharge. No further needs                 Action/Plan:   Expected Discharge Date:  07/08/18               Expected Discharge Plan:  [place]  In-House Referral:     Discharge planning Services  CM Consult  Post Acute Care Choice:  Home Health Choice offered to:  Patient  DME Arranged:    DME Agency:     HH Arranged:  PT(vestibular rehab) [place]:  [place]  Status of Service:  Completed, signed off  If discussed at [place] of Stay Meetings, dates discussed:    Additional Comments:  Latanya Maudlin, RN 07/08/2018, 10:56 AM

## 2018-07-08 NOTE — Discharge Summary (Signed)
Physician Discharge Summary  Dustin Dominguez FTD:[id number] DOB: 1940-09-26 DOA: 07/06/2018  PCP: Chipper Herb, MD  Admit date: 07/06/2018 Discharge date: 07/08/2018  Admitted From: Home Disposition:  Home   Recommendations for Outpatient Follow-up:  1. Follow up with PCP in 1-2 weeks 2. Please obtain BMP/CBC in one week   Home Health: YES Equipment/Devices:   Discharge Condition: Stable CODE STATUS: FULL Diet recommendation: Heart Healthy    Brief/Interim Summary: 77 year old male with a history of anxiety, chronic pain syndrome, BPH, pulmonary disease, hypertension, GERD, diabetes mellitus, atrial fibrillation on apixaban presenting with 10-day history of dizziness, gait instability, and vertigo.  The patient was recently mated to the hospital on 06/29/2018 through 06/30/2018 during which time the patient was treated for scrotal cellulitis.  The patient represented back to the emergency department 07/05/2018 because of dizziness.  MRI of the brain at that time was negative for any acute findings.  The patient was prescribed meclizine.  He stated that it did not help very much, but he only sporadically took it.  He denies any other new medications other than the antibiotics he was recently placed on.  He denies any focal deficits, visual disturbance, or syncope.  There is no dysuria, hematuria, fevers, chills, chest pain, worsening shortness of breath, nausea, vomiting, diarrhea.  Because of his vertigo, the patient was admitted for further evaluation. Orthostatics were positive.  The patient was started on IV fluids.  He was treated symptomatically with meclizine, Zofran, Valium.  The patient was started on prednisone for vestibular neuritis. Repeat orthostatic vital signs still showed some mild drop in his blood pressure, but not as precipitous as at the time of admission.  The patient was intent on being discharged.  The patient was instructed to increase his fluid intake.  The  patient will likely need to tolerate higher baseline BP due to his orthostasis contributing to his dizziness.  As result, his Accupril was discontinued.  He was instructed to follow-up with his PCP.  He may ultimately require referral to ENT.  Home health physical therapy was set up for the patient.  Discharge Diagnoses:  Vertigo -Likely vestibular neuritis which may have been partly exacerbated by the patient's recent doxycycline and orthostatic hypotension both of which may have worsened his dizziness. 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 as there is a lot of scarring on examination -Physical therapy eval -Continue meclizine -Check orthostatics--positive -Repeat orthostatics were still mildly positive, but did not have as precipitous of blood pressure drop -Continued IV fluids -Started empiric steroids--> DC home prednisone 60 mg daily, then decrease by 10mg  daily starting day #6 -07/05/2018 MRI brain negative for acute findings -Repeat serum B12 level--363 -TSH--0.604 -Urinalysis--neg for pyuria -Urine drug screen--positive opioids -Patient has positive Dix-Hallpike maneuver  Diabetes mellitus type 2 -Holding Amaryl -04/05/2018 hemoglobin A1c 5.2  Essential hypertension -Restart metoprolol, amlodipine -May need to tolerate higher baseline blood pressure in the setting of orthostasis -Holding ACE inhibitor--follow-up with PCP  Atrial flutter, atypical -Rate controlled -Restart metoprolol tartrate -Restart apixaban  Chronic pain syndrome -Continue home doses of Valium, MS Contin, Percocet  Coronary artery disease -No chest pain presently -Personally reviewed EKG--sinus rhythm, first-degree AV block -Continue Imdur  Scrotal cellulitis -The patient was discharged home with doxycycline and on 06/30/2018 -He has finished 1 week of antibiotics -No signs of persistent infection on examination -Hold off starting  antibiotics at this time  Mixed hyperlipidemia -Continue Lipitor   Discharge  Instructions   Allergies as of 07/08/2018      Reactions   Amitriptyline Other (See Comments)   sleepy   Bextra [valdecoxib] Other (See Comments)   unknown   Codeine Nausea And Vomiting   Cymbalta [duloxetine Hcl] Swelling, Other (See Comments)   dizzy   Esomeprazole Magnesium Diarrhea   Niacin-lovastatin Er Other (See Comments)   headache   Niaspan [niacin Er] Other (See Comments)   Increased headache   Penicillins Rash   Has patient had a PCN reaction causing immediate rash, facial/tongue/throat swelling, SOB or lightheadedness with hypotension: Yes Has patient had a PCN reaction causing severe rash involving mucus membranes or skin necrosis: No Has patient had a PCN reaction that required hospitalization No Has patient had a PCN reaction occurring within the last 10 years: No Tolerated Zosyn 06/2016      Medication List    STOP taking these medications   amLODipine 10 MG tablet Commonly known as:  NORVASC   cefdinir 300 MG capsule Commonly known as:  OMNICEF   cephALEXin 500 MG capsule Commonly known as:  KEFLEX   doxycycline 100 MG tablet Commonly known as:  VIBRA-TABS   quinapril 20 MG tablet Commonly known as:  ACCUPRIL     TAKE these medications   albuterol 108 (90 Base) MCG/ACT inhaler Commonly known as:  PROVENTIL HFA;VENTOLIN HFA Inhale 1-2 puffs into the lungs every 6 (six) hours as needed for wheezing.   atorvastatin 20 MG tablet Commonly known as:  LIPITOR Take 1 tablet (20 mg total) by mouth daily.   diazepam 10 MG tablet Commonly known as:  VALIUM Take 1 tablet (10 mg total) by mouth 2 (two) times daily as needed.   ELIQUIS 5 MG Tabs tablet Generic drug:  apixaban TAKE 1 TABLET BY MOUTH TWICE DAILY What changed:  how much to take   fenofibrate 160 MG tablet TAKE 1 TABLET BY MOUTH EVERY DAY   fish oil-omega-3 fatty acids 1000 MG capsule Take 1 g by mouth  daily.   furosemide 20 MG tablet Commonly known as:  LASIX Take 1 tablet (20 mg total) by mouth daily. Restart 07/10/18 Start taking on:  July 10, 2018 What changed:    additional instructions  These instructions start on July 10, 2018. If you are unsure what to do until then, ask your doctor or other care provider.   glimepiride 2 MG tablet Commonly known as:  AMARYL TAKE 1 TABLET BY MOUTH EVERY DAY DO not fill UNTIL asked What changed:    how much to take  how to take this  when to take this  additional instructions   isosorbide mononitrate 30 MG 24 hr tablet Commonly known as:  IMDUR TAKE ONE TABLET BY MOUTH EVERY DAY   lactulose 10 GM/15ML solution Commonly known as:  CHRONULAC Take 15 mLs (10 g total) by mouth daily as needed for mild constipation, moderate constipation or severe constipation. Reported on 11/11/2015   meclizine 25 MG tablet Commonly known as:  ANTIVERT Take 2 tablets (50 mg total) by mouth 3 (three) times daily as needed for dizziness. What changed:    medication strength  how much to take   metoprolol tartrate 50 MG tablet Commonly known as:  LOPRESSOR TAKE 1/2 TABLET BY MOUTH TWICE DAILY   morphine 30 MG 12 hr tablet Commonly known as:  MS CONTIN Take 30 mg by mouth 3 (three) times daily.   ondansetron 4 MG disintegrating tablet Commonly known as:  ZOFRAN ODT Take  1 tablet (4 mg total) by mouth every 8 (eight) hours as needed for nausea or vomiting.   pantoprazole 40 MG tablet Commonly known as:  PROTONIX TAKE ONE TABLET BY MOUTH TWICE DAILY   PERCOCET 10-325 MG tablet Generic drug:  oxyCODONE-acetaminophen Take 1 tablet by mouth 6 (six) times daily.   potassium chloride 10 MEQ tablet Commonly known as:  K-DUR,KLOR-CON TAKE ONE TABLET BY MOUTH DAILY   predniSONE 10 MG tablet Commonly known as:  DELTASONE Take 6 tablets (60 mg total) by mouth daily with breakfast. Decrease by one tablet daily starting 12/27   vitamin  B-12 1000 MCG tablet Commonly known as:  CYANOCOBALAMIN Take 1 tablet (1,000 mcg total) by mouth daily.   Vitamin D (Ergocalciferol) 1.25 MG (50000 UT) Caps capsule Commonly known as:  DRISDOL TAKE ONE CAPSULE BY MOUTH ONCE A WEEK What changed:  when to take this       Allergies  Allergen Reactions  . Amitriptyline Other (See Comments)    sleepy  . Bextra [Valdecoxib] Other (See Comments)    unknown  . Codeine Nausea And Vomiting  . Cymbalta [Duloxetine Hcl] Swelling and Other (See Comments)    dizzy  . Esomeprazole Magnesium Diarrhea  . Niacin-Lovastatin Er Other (See Comments)    headache  . Niaspan [Niacin Er] Other (See Comments)    Increased headache  . Penicillins Rash    Has patient had a PCN reaction causing immediate rash, facial/tongue/throat swelling, SOB or lightheadedness with hypotension: Yes Has patient had a PCN reaction causing severe rash involving mucus membranes or skin necrosis: No Has patient had a PCN reaction that required hospitalization No Has patient had a PCN reaction occurring within the last 10 years: No Tolerated Zosyn 06/2016     Consultations:  none   Procedures/Studies: Ct Head Wo Contrast  Result Date: 06/29/2018 CLINICAL DATA:  Dizziness. EXAM: CT HEAD WITHOUT CONTRAST TECHNIQUE: Contiguous axial images were obtained from the base of the skull through the vertex without intravenous contrast. COMPARISON:  MRI of July 17, 2016. FINDINGS: Brain: No evidence of acute infarction, hemorrhage, hydrocephalus, extra-axial collection or mass lesion/mass effect. Vascular: No hyperdense vessel or unexpected calcification. Skull: Normal. Negative for fracture or focal lesion. Sinuses/Orbits: Bilateral ethmoid sinusitis is noted. Other: None. IMPRESSION: Bilateral ethmoid sinusitis. No acute intracranial abnormality seen. Electronically Signed   By: Marijo Conception, M.D.   On: 06/29/2018 21:05   Mr Brain Wo Contrast  Result Date:  07/06/2018 CLINICAL DATA:  Dizziness EXAM: MRI HEAD WITHOUT CONTRAST TECHNIQUE: Multiplanar, multiecho pulse sequences of the brain and surrounding structures were obtained without intravenous contrast. COMPARISON:  None. FINDINGS: BRAIN: There is no acute infarct, acute hemorrhage, hydrocephalus or extra-axial collection. The midline structures are normal. No midline shift or other mass effect. There are no old infarcts. Multifocal white matter hyperintensity, most commonly due to chronic ischemic microangiopathy. The cerebral and cerebellar volume are age-appropriate. Susceptibility-sensitive sequences show no chronic microhemorrhage or superficial siderosis. VASCULAR: Major intracranial arterial and venous sinus flow voids are normal. SKULL AND UPPER CERVICAL SPINE: Calvarial bone marrow signal is normal. There is no skull base mass. Visualized upper cervical spine and soft tissues are normal. SINUSES/ORBITS: No fluid levels or advanced mucosal thickening. No mastoid or middle ear effusion. The orbits are normal. IMPRESSION: 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 Electronically Signed   By: Ulyses Jarred M.D.   On: 07/06/2018 02:01   US Scrotum  Result Date: 06/30/2018 CLINICAL DATA:  Scrotal pain for 5 days. Prior history of scrotal abscess in 2017. EXAM: ULTRASOUND OF SCROTUM TECHNIQUE: Complete ultrasound examination of the testicles, epididymis, and other scrotal structures was performed. COMPARISON:  03/26/2017 and 07/15/2016 FINDINGS: Right testicle Measurements: 3.7 x 2.0 x 2.6 cm. Somewhat heterogeneous echogenicity but unchanged since prior exams. No testicular mass. Testicular microlithiasis again noted. Findings likely related to prior infection or inflammation. Patent intra testicular blood flow. Left testicle Measurements: 3.6 x 2.0 x 3.0 cm. Heterogeneous echogenicity and microlithiasis but no focal testicular lesion. Patent blood flow. Right epididymis:   Poor visualization of the right epididymis. Left epididymis:  Normal in size and appearance. Hydrocele:  Small left hydrocele. Varicocele:  None visualized. Other: Scrotal skin thickening. IMPRESSION: 1. 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 No mass. 2. Small left hydrocele. 3. Scrotal skin thickening. Electronically Signed   By: Marijo Sanes M.D.   On: 06/30/2018 15:18   Ct Abdomen Pelvis W Contrast  Result Date: 06/30/2018 CLINICAL DATA:  77 year old male with chronic back pain and scrotal pain. Abdominal distension. EXAM: CT ABDOMEN AND PELVIS WITH CONTRAST TECHNIQUE: Multidetector CT imaging of the abdomen and pelvis was performed using the standard protocol following bolus administration of intravenous contrast. CONTRAST:  67mL ISOVUE-300 IOPAMIDOL (ISOVUE-300) INJECTION 61%, 17mL ISOVUE-300 IOPAMIDOL (ISOVUE-300) INJECTION 61% COMPARISON:  CT of the abdomen pelvis dated 07/07/2016 FINDINGS: Lower chest: The visualized lung bases are clear. No intra-abdominal free air or free fluid. Hepatobiliary: Linear coarse calcification through the right lobe of the liver similar to prior studies likely related to prior surgery or sequela of prior insult. Additional scattered small calcified granuloma. The liver is otherwise unremarkable. No intrahepatic biliary ductal dilatation. Cholecystectomy. Pancreas: Unremarkable. No pancreatic ductal dilatation or surrounding inflammatory changes. Spleen: Splenectomy. Adrenals/Urinary Tract: The left adrenal gland is unremarkable. The right adrenal gland is not visualized. There is a 4.5 cm left renal upper pole cyst. There is symmetric enhancement and excretion of contrast by both kidneys. There is no hydronephrosis on either side. The visualized ureters appear unremarkable. The urinary bladder is only partially distended and grossly unremarkable. Stomach/Bowel: There is diffuse  thickened appearance of the gastric rugal folds most likely related to underdistention. There are small scattered sigmoid diverticula without active inflammatory changes. There is moderate amount of stool throughout the colon. There is no bowel obstruction or active inflammation. Normal appendix. Vascular/Lymphatic: Moderate aortoiliac atherosclerotic disease. No portal venous gas. There is no adenopathy. Reproductive: The prostate and seminal vesicles are grossly unremarkable. Other: Small fat containing bilateral inguinal hernias, left greater right. No inflammatory changes or fluid collection. Small bilateral hydroceles noted. There is apparent laxity of the right anterolateral abdominal wall. Musculoskeletal: There is osteopenia with mild scoliosis and multilevel degenerative changes of the spine. No acute osseous pathology. IMPRESSION: 1. No acute intra-abdominal or pelvic pathology. 2. Moderate colonic stool burden. No bowel obstruction or active inflammation. Normal appendix. 3. Scattered sigmoid diverticula without active inflammatory changes. Electronically Signed   By: Anner Crete M.D.   On: 06/30/2018 02:58        Discharge Exam: Vitals:   07/07/18 2107 07/08/18 0625  BP: 130/60 128/72  Pulse: (!) 53 (!) 52  Resp: 18 18  Temp: 98.3 F (36.8 C) 98.3 F (36.8 C)  SpO2: 96% 98%   Vitals:   07/07/18 1137 07/07/18 1302 07/07/18 2107 07/08/18 0625  BP: (!) 145/55 (!) 137/55 130/60 128/72  Pulse: 69 (!) 57 (!) 53 (!) 52  Resp:  18 18 18   Temp:  98.5 F (36.9 C) 98.3 F (36.8 C) 98.3 F (36.8 C)  TempSrc:   Oral Oral  SpO2:  97% 96% 98%  Weight:      Height:        General: Pt is alert, awake, not in acute distress Cardiovascular: RRR, S1/S2 +, no rubs, no gallops Respiratory: CTA bilaterally, no wheezing, no rhonchi Abdominal: Soft, NT, ND, bowel sounds + Extremities: no edema, no cyanosis   The results of significant diagnostics from this hospitalization (including  imaging, microbiology, ancillary and laboratory) are listed below for reference.    Significant Diagnostic Studies: Ct Head Wo Contrast  Result Date: 06/29/2018 CLINICAL DATA:  Dizziness. EXAM: CT HEAD WITHOUT CONTRAST TECHNIQUE: Contiguous axial images were obtained from the base of the skull through the vertex without intravenous contrast. COMPARISON:  MRI of July 17, 2016. FINDINGS: Brain: No evidence of acute infarction, hemorrhage, hydrocephalus, extra-axial collection or mass lesion/mass effect. Vascular: No hyperdense vessel or unexpected calcification. Skull: Normal. Negative for fracture or focal lesion. Sinuses/Orbits: Bilateral ethmoid sinusitis is noted. Other: None. IMPRESSION: Bilateral ethmoid sinusitis. No acute intracranial abnormality seen. Electronically Signed   By: Marijo Conception, M.D.   On: 06/29/2018 21:05   Mr Brain Wo Contrast  Result Date: 07/06/2018 CLINICAL DATA:  Dizziness EXAM: MRI HEAD WITHOUT CONTRAST TECHNIQUE: Multiplanar, multiecho pulse sequences of the brain and surrounding structures were obtained without intravenous contrast. COMPARISON:  None. FINDINGS: BRAIN: There is no acute infarct, acute hemorrhage, hydrocephalus or extra-axial collection. The midline structures are normal. No midline shift or other mass effect. There are no old infarcts. Multifocal white matter hyperintensity, most commonly due to chronic ischemic microangiopathy. The cerebral and cerebellar volume are age-appropriate. Susceptibility-sensitive sequences show no chronic microhemorrhage or superficial siderosis. VASCULAR: Major intracranial arterial and venous sinus flow voids are normal. SKULL AND UPPER CERVICAL SPINE: Calvarial bone marrow signal is normal. There is no skull base mass. Visualized upper cervical spine and soft tissues are normal. SINUSES/ORBITS: No fluid levels or advanced mucosal thickening. No mastoid or middle ear effusion. The orbits are normal. IMPRESSION: Mild  chronic small vessel disease without acute intracranial abnormality. Electronically Signed   By: Ulyses Jarred M.D.   On: 07/06/2018 02:01   US Scrotum  Result Date: 06/30/2018 CLINICAL DATA:  Scrotal pain for 5 days. Prior history of scrotal abscess in 2017. EXAM: ULTRASOUND OF SCROTUM TECHNIQUE: Complete ultrasound examination of the testicles, epididymis, and other scrotal structures was performed. COMPARISON:  03/26/2017 and 07/15/2016 FINDINGS: Right testicle Measurements: 3.7 x 2.0 x 2.6 cm. Somewhat heterogeneous echogenicity but unchanged since prior exams. No testicular mass. Testicular microlithiasis again noted. Findings likely related to prior infection or inflammation. Patent intra testicular blood flow. Left testicle Measurements: 3.6 x 2.0 x 3.0 cm. Heterogeneous echogenicity and microlithiasis but no focal testicular lesion. Patent blood flow. Right epididymis:  Poor visualization of the right epididymis. Left epididymis:  Normal in size and appearance. Hydrocele:  Small left hydrocele. Varicocele:  None visualized. Other: Scrotal skin thickening. IMPRESSION: 1. 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 No mass. 2. Small left hydrocele. 3. Scrotal skin thickening. Electronically Signed   By: Marijo Sanes M.D.   On: 06/30/2018 15:18   Ct Abdomen Pelvis W Contrast  Result Date: 06/30/2018 CLINICAL DATA:  77 year old male with chronic back pain and scrotal pain. Abdominal distension. EXAM: CT ABDOMEN AND PELVIS WITH CONTRAST TECHNIQUE: Multidetector CT imaging  of the abdomen and pelvis was performed using the standard protocol following bolus administration of intravenous contrast. CONTRAST:  66mL ISOVUE-300 IOPAMIDOL (ISOVUE-300) INJECTION 61%, 174mL ISOVUE-300 IOPAMIDOL (ISOVUE-300) INJECTION 61% COMPARISON:  CT of the abdomen pelvis dated 07/07/2016 FINDINGS: Lower chest: The visualized lung bases are  clear. No intra-abdominal free air or free fluid. Hepatobiliary: Linear coarse calcification through the right lobe of the liver similar to prior studies likely related to prior surgery or sequela of prior insult. Additional scattered small calcified granuloma. The liver is otherwise unremarkable. No intrahepatic biliary ductal dilatation. Cholecystectomy. Pancreas: Unremarkable. No pancreatic ductal dilatation or surrounding inflammatory changes. Spleen: Splenectomy. Adrenals/Urinary Tract: The left adrenal gland is unremarkable. The right adrenal gland is not visualized. There is a 4.5 cm left renal upper pole cyst. There is symmetric enhancement and excretion of contrast by both kidneys. There is no hydronephrosis on either side. The visualized ureters appear unremarkable. The urinary bladder is only partially distended and grossly unremarkable. Stomach/Bowel: There is diffuse thickened appearance of the gastric rugal folds most likely related to underdistention. There are small scattered sigmoid diverticula without active inflammatory changes. There is moderate amount of stool throughout the colon. There is no bowel obstruction or active inflammation. Normal appendix. Vascular/Lymphatic: Moderate aortoiliac atherosclerotic disease. No portal venous gas. There is no adenopathy. Reproductive: The prostate and seminal vesicles are grossly unremarkable. Other: Small fat containing bilateral inguinal hernias, left greater right. No inflammatory changes or fluid collection. Small bilateral hydroceles noted. There is apparent laxity of the right anterolateral abdominal wall. Musculoskeletal: There is osteopenia with mild scoliosis and multilevel degenerative changes of the spine. No acute osseous pathology. IMPRESSION: 1. No acute intra-abdominal or pelvic pathology. 2. Moderate colonic stool burden. No bowel obstruction or active inflammation. Normal appendix. 3. Scattered sigmoid diverticula without active  inflammatory changes. Electronically Signed   By: Anner Crete M.D.   On: 06/30/2018 02:58     Microbiology: Recent Results (from the past 240 hour(s))  Culture, Urine     Status: Abnormal   Collection Time: 06/30/18 12:12 AM  Result Value Ref Range Status   Specimen Description   Final    URINE, CLEAN CATCH Performed at [facility], [address], [place], [place] [address]    Special Requests   Final    NONE Performed at [facility], [address] [place], [place] [address]    Culture >=100,000 COLONIES/mL ESCHERICHIA COLI (A)  Final   Report Status 07/02/2018 FINAL  Final   Organism ID, Bacteria ESCHERICHIA COLI (A)  Final      Susceptibility   Escherichia coli - MIC*    AMPICILLIN >=32 RESISTANT Resistant     CEFAZOLIN 8 SENSITIVE Sensitive     CEFTRIAXONE <=1 SENSITIVE Sensitive     CIPROFLOXACIN >=4 RESISTANT Resistant     GENTAMICIN <=1 SENSITIVE Sensitive     IMIPENEM <=0.25 SENSITIVE Sensitive     NITROFURANTOIN >=512 RESISTANT Resistant     TRIMETH/SULFA >=320 RESISTANT Resistant     AMPICILLIN/SULBACTAM >=32 RESISTANT Resistant     PIP/TAZO <=4 SENSITIVE Sensitive     Extended ESBL NEGATIVE Sensitive     * >=100,000 COLONIES/mL ESCHERICHIA COLI     Labs: Basic Metabolic Panel: Recent Labs  Lab 07/05/18 2114 07/07/18 0045 07/08/18 0617  NA 143 138 141  K 3.4* 3.6 3.4*  CL 110 108 111  CO2 24 24 26   GLUCOSE 87 104* 112*  BUN 11 12 17   CREATININE 0.78 0.74 0.79  CALCIUM  7.6* 7.1* 6.8*   Liver Function Tests: Recent Labs  Lab 07/05/18 2114 07/07/18 0045  AST 27 27  ALT 18 20  ALKPHOS 26* 25*  BILITOT 1.3* 1.0  PROT 6.0* 5.9*  ALBUMIN 3.1* 3.1*   No results for input(s): LIPASE, AMYLASE in the last 168 hours. No results for input(s): AMMONIA in the last 168 hours. CBC: Recent Labs  Lab 07/05/18 2114 07/07/18 0045 07/08/18 0617  WBC 15.6* 17.4* 15.8*  NEUTROABS 9.2* 10.4*  --   HGB 12.7* 12.5* 11.4*  HCT 38.4* 37.4* 34.9*  MCV  92.1 91.7 92.6  PLT 314 294 276   Cardiac Enzymes: No results for input(s): CKTOTAL, CKMB, CKMBINDEX, TROPONINI in the last 168 hours. BNP: Invalid input(s): POCBNP CBG: No results for input(s): GLUCAP in the last 168 hours.  Time coordinating discharge:  36 minutes  Signed:  Orson Eva, DO [facility] Pager: [phone] 07/08/2018, 9:26 AM

## 2018-07-08 NOTE — Progress Notes (Signed)
Patient's IV catheter removed and intact. Pt's IV site clean dry and intact. Discharge instructions including medications and follow up appointments were reviewed and discussed with patient. All questions were answered and no further questions at this time. Pt in stable condition and in no acute distress at time of discharge. Pt escorted by nurse tech 

## 2018-07-10 ENCOUNTER — Telehealth: Payer: Self-pay | Admitting: *Deleted

## 2018-07-10 ENCOUNTER — Other Ambulatory Visit: Payer: Self-pay | Admitting: Family Medicine

## 2018-07-10 NOTE — Telephone Encounter (Signed)
Incomplete TCM call

## 2018-07-13 ENCOUNTER — Telehealth: Payer: Self-pay | Admitting: Family Medicine

## 2018-07-14 MED ORDER — MECLIZINE HCL 25 MG PO TABS: 50.0000 mg | ORAL_TABLET | Freq: Three times a day (TID) | ORAL | 0 refills | Status: DC | PRN

## 2018-07-14 NOTE — Telephone Encounter (Signed)
Meclizine rx sent to pharmacy

## 2018-07-17 ENCOUNTER — Encounter: Payer: Self-pay | Admitting: Family Medicine

## 2018-07-17 ENCOUNTER — Ambulatory Visit (INDEPENDENT_AMBULATORY_CARE_PROVIDER_SITE_OTHER): Payer: Medicare Other | Admitting: Family Medicine

## 2018-07-17 VITALS — BP 135/72 | HR 63 | Temp 98.0°F | Ht 71.0 in | Wt 195.0 lb

## 2018-07-17 DIAGNOSIS — N3000 Acute cystitis without hematuria: Secondary | ICD-10-CM | POA: Diagnosis not present

## 2018-07-17 DIAGNOSIS — Z9189 Other specified personal risk factors, not elsewhere classified: Secondary | ICD-10-CM

## 2018-07-17 DIAGNOSIS — Z09 Encounter for follow-up examination after completed treatment for conditions other than malignant neoplasm: Secondary | ICD-10-CM | POA: Diagnosis not present

## 2018-07-17 DIAGNOSIS — E876 Hypokalemia: Secondary | ICD-10-CM

## 2018-07-17 DIAGNOSIS — R42 Dizziness and giddiness: Secondary | ICD-10-CM

## 2018-07-17 DIAGNOSIS — R350 Frequency of micturition: Secondary | ICD-10-CM | POA: Diagnosis not present

## 2018-07-17 DIAGNOSIS — IMO0001 Reserved for inherently not codable concepts without codable children: Secondary | ICD-10-CM

## 2018-07-17 LAB — URINALYSIS, ROUTINE W REFLEX MICROSCOPIC
Bilirubin, UA: NEGATIVE
GLUCOSE, UA: NEGATIVE
NITRITE UA: POSITIVE — AB
RBC, UA: NEGATIVE
Specific Gravity, UA: 1.025 (ref 1.005–1.030)
Urobilinogen, Ur: 1 mg/dL (ref 0.2–1.0)
pH, UA: 5.5 (ref 5.0–7.5)

## 2018-07-17 LAB — MICROSCOPIC EXAMINATION
Renal Epithel, UA: NONE SEEN /hpf
WBC, UA: >30 /hpf — AB (ref 0–5)

## 2018-07-17 MED ORDER — CEPHALEXIN 500 MG PO CAPS: 500.0000 mg | ORAL_CAPSULE | Freq: Two times a day (BID) | ORAL | 0 refills | Status: AC
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
## 2018-07-17 NOTE — Patient Instructions (Signed)
Vertigo    Vertigo means that you feel like you are moving when you are not. Vertigo can also make you feel like things around you are moving when they are not. This feeling can come and go at any time. Vertigo often goes away on its own.  Follow these instructions at home:  · Avoid making fast movements.  · Avoid driving.  · Avoid using heavy machinery.  · Avoid doing any task or activity that might cause danger to you or other people if you would have a vertigo attack while you are doing it.  · Sit down right away if you feel dizzy or have trouble with your balance.  · Take over-the-counter and prescription medicines only as told by your doctor.  · Follow instructions from your doctor about which positions or movements you should avoid.  · Drink enough fluid to keep your pee (urine) clear or pale yellow.  · Keep all follow-up visits as told by your doctor. This is important.  Contact a doctor if:  · Medicine does not help your vertigo.  · You have a fever.  · Your problems get worse or you have new symptoms.  · Your family or friends see changes in your behavior.  · You feel sick to your stomach (nauseous) or you throw up (vomit).  · You have a “pins and needles” feeling or you are numb in part of your body.  Get help right away if:  · You have trouble moving or talking.  · You are always dizzy.  · You pass out (faint).  · You get very bad headaches.  · You feel weak or have trouble using your hands, arms, or legs.  · You have changes in your hearing.  · You have changes in your seeing (vision).  · You get a stiff neck.  · Bright light starts to bother you.  This information is not intended to replace advice given to you by your health care provider. Make sure you discuss any questions you have with your health care provider.  Document Released: 04/13/2008 Document Revised: 12/11/2015 Document Reviewed: 10/28/2014  Elsevier Interactive Patient Education © 2019 [employer]

## 2018-07-20 ENCOUNTER — Telehealth: Payer: Self-pay | Admitting: *Deleted

## 2018-07-20 LAB — URINE CULTURE

## 2018-07-20 NOTE — Telephone Encounter (Signed)
-----   Message from Chipper Herb, MD sent at 07/20/2018 12:31 PM EST ----- Regarding: FW: [facility] Referral - [facility] Please get virtual behavioral health services to address this patient's symptoms ----- Message ----- From: Marjorie Smolder, LCAS-A Sent: 07/20/2018  11:18 AM EST To: Chipper Herb, MD, Ava Delmer Islam, LCAS-A Subject: [facility] Referral - [facility]                             Dear: Dr. Laurance Flatten,   Your patient has an elevated PHQ-9 score of 16 documented in their chart.    If you want your patient to have [facility] services to address their depressive symptoms, please use the [facility] message option in Epic/CHL on your visit navigator to request our services.  If you have any questions, please call [phone].  The [facility] Team will be contacting the patient in order to introduce [facility] services.     Thank you for your time, [facility]

## 2018-07-20 NOTE — Telephone Encounter (Signed)
Referral placed through the [facility].

## 2018-07-21 ENCOUNTER — Telehealth: Payer: Self-pay

## 2018-07-21 DIAGNOSIS — H26491 Other secondary cataract, right eye: Secondary | ICD-10-CM | POA: Diagnosis not present

## 2018-07-21 NOTE — Telephone Encounter (Signed)
[facility] - Left Message in order to complete an intake assessment.

## 2018-07-25 LAB — HM DIABETES EYE EXAM

## 2018-07-27 ENCOUNTER — Ambulatory Visit (INDEPENDENT_AMBULATORY_CARE_PROVIDER_SITE_OTHER): Payer: Medicare Other

## 2018-07-27 DIAGNOSIS — H8123 Vestibular neuronitis, bilateral: Secondary | ICD-10-CM

## 2018-07-27 DIAGNOSIS — I10 Essential (primary) hypertension: Secondary | ICD-10-CM

## 2018-07-27 DIAGNOSIS — M549 Dorsalgia, unspecified: Secondary | ICD-10-CM | POA: Diagnosis not present

## 2018-07-27 DIAGNOSIS — M542 Cervicalgia: Secondary | ICD-10-CM

## 2018-07-27 DIAGNOSIS — G8929 Other chronic pain: Secondary | ICD-10-CM

## 2018-07-27 DIAGNOSIS — I951 Orthostatic hypotension: Secondary | ICD-10-CM | POA: Diagnosis not present

## 2018-07-27 DIAGNOSIS — I739 Peripheral vascular disease, unspecified: Secondary | ICD-10-CM

## 2018-07-27 DIAGNOSIS — E782 Mixed hyperlipidemia: Secondary | ICD-10-CM

## 2018-07-27 DIAGNOSIS — E119 Type 2 diabetes mellitus without complications: Secondary | ICD-10-CM

## 2018-07-27 DIAGNOSIS — I480 Paroxysmal atrial fibrillation: Secondary | ICD-10-CM

## 2018-07-27 DIAGNOSIS — F419 Anxiety disorder, unspecified: Secondary | ICD-10-CM

## 2018-07-27 DIAGNOSIS — Z7901 Long term (current) use of anticoagulants: Secondary | ICD-10-CM

## 2018-07-27 DIAGNOSIS — K279 Peptic ulcer, site unspecified, unspecified as acute or chronic, without hemorrhage or perforation: Secondary | ICD-10-CM

## 2018-07-27 DIAGNOSIS — Z794 Long term (current) use of insulin: Secondary | ICD-10-CM

## 2018-07-27 DIAGNOSIS — K5790 Diverticulosis of intestine, part unspecified, without perforation or abscess without bleeding: Secondary | ICD-10-CM

## 2018-07-27 DIAGNOSIS — N492 Inflammatory disorders of scrotum: Secondary | ICD-10-CM

## 2018-07-27 DIAGNOSIS — I252 Old myocardial infarction: Secondary | ICD-10-CM

## 2018-07-27 DIAGNOSIS — K219 Gastro-esophageal reflux disease without esophagitis: Secondary | ICD-10-CM

## 2018-07-27 DIAGNOSIS — N400 Enlarged prostate without lower urinary tract symptoms: Secondary | ICD-10-CM

## 2018-08-03 ENCOUNTER — Telehealth: Payer: Self-pay

## 2018-08-03 NOTE — Telephone Encounter (Signed)
2nd attempt - [facility]   

## 2018-08-09 ENCOUNTER — Telehealth: Payer: Self-pay

## 2018-08-09 NOTE — Telephone Encounter (Signed)
3rd attempt - [facility]  

## 2018-08-10 ENCOUNTER — Ambulatory Visit (INDEPENDENT_AMBULATORY_CARE_PROVIDER_SITE_OTHER): Payer: Medicare Other | Admitting: Family Medicine

## 2018-08-10 ENCOUNTER — Encounter: Payer: Self-pay | Admitting: Family Medicine

## 2018-08-10 VITALS — BP 112/59 | HR 75 | Temp 97.3°F | Ht 71.0 in | Wt 185.0 lb

## 2018-08-10 DIAGNOSIS — E559 Vitamin D deficiency, unspecified: Secondary | ICD-10-CM

## 2018-08-10 DIAGNOSIS — E1140 Type 2 diabetes mellitus with diabetic neuropathy, unspecified: Secondary | ICD-10-CM | POA: Diagnosis not present

## 2018-08-10 DIAGNOSIS — L84 Corns and callosities: Secondary | ICD-10-CM

## 2018-08-10 DIAGNOSIS — J440 Chronic obstructive pulmonary disease with acute lower respiratory infection: Secondary | ICD-10-CM

## 2018-08-10 DIAGNOSIS — K219 Gastro-esophageal reflux disease without esophagitis: Secondary | ICD-10-CM

## 2018-08-10 DIAGNOSIS — E1151 Type 2 diabetes mellitus with diabetic peripheral angiopathy without gangrene: Secondary | ICD-10-CM

## 2018-08-10 DIAGNOSIS — I2510 Atherosclerotic heart disease of native coronary artery without angina pectoris: Secondary | ICD-10-CM

## 2018-08-10 DIAGNOSIS — J209 Acute bronchitis, unspecified: Secondary | ICD-10-CM

## 2018-08-10 DIAGNOSIS — M79671 Pain in right foot: Secondary | ICD-10-CM

## 2018-08-10 DIAGNOSIS — E782 Mixed hyperlipidemia: Secondary | ICD-10-CM

## 2018-08-10 DIAGNOSIS — I10 Essential (primary) hypertension: Secondary | ICD-10-CM

## 2018-08-10 DIAGNOSIS — I700 Atherosclerosis of aorta: Secondary | ICD-10-CM

## 2018-08-10 LAB — BAYER DCA HB A1C WAIVED: HB A1C (BAYER DCA - WAIVED): 5.1 % (ref <7.0)

## 2018-08-10 MED ORDER — AZITHROMYCIN 250 MG PO TABS: ORAL_TABLET | ORAL | 0 refills | Status: DC

## 2018-08-10 MED ORDER — BENZONATATE 100 MG PO CAPS: 100.0000 mg | ORAL_CAPSULE | Freq: Two times a day (BID) | ORAL | 0 refills | Status: DC | PRN

## 2018-08-10 MED ORDER — DIAZEPAM 10 MG PO TABS: 10.0000 mg | ORAL_TABLET | Freq: Two times a day (BID) | ORAL | 5 refills | Status: DC | PRN

## 2018-08-10 MED ORDER — MECLIZINE HCL 25 MG PO TABS: 50.0000 mg | ORAL_TABLET | Freq: Three times a day (TID) | ORAL | 3 refills | Status: DC | PRN

## 2018-08-10 NOTE — Progress Notes (Signed)
Subjective:    Patient ID: Dustin Dominguez, male    DOB: 1940-12-10, 78 y.o.   MRN: [id number]  HPI Pt here for follow up and management of chronic medical problems which includes diabetes, hypertension and hyperlipidemia. He is taking medication regularly.  The patient is doing well overall and is requesting refill on his diazepam meclizine and Tessalon Perles for cough.  He continues to have ongoing issues with his growing infection and cellulitis and does see the urologist regularly.  He is given an FOBT to return today and will get lab work today.  He did receive his flu shot at [facility].  His vital signs are stable and the good news is that his weight is down 10 pounds from the previous visit his BMI is now 27.21.  Blood pressure readings are good.  The patient has anxiety diabetes GERD sleep apnea hyperlipidemia and heart attack in the past.  He has chronic neck and back pain and does see the orthopedic surgeon for his chronic pain issues.  He is followed periodically by Dr. Domenic Polite his cardiologist.  Patient today looks good.  He does have to call and schedule his visit with the cardiologist.  He did have a recent eye exam and has had some laser surgery on his eye and had this done at my eye doctor in [place].  He denies any more chest pain than usual and no more shortness of breath than usual other than having this cough and congestion and yellow sputum for a couple of weeks.  He is doing well with his stomach with no heartburn indigestion nausea vomiting diarrhea or blood in the stool or change in bowel habits.  He is passing his water okay right now.  We did review the hospitalization that occurred back in December for severe dizziness this is better now but he still has bouts with dizziness.  He does not check his blood sugars that often because they run so well.     Review of Systems  Constitutional: Negative.   HENT: Positive for congestion.   Eyes: Negative.   Respiratory: Positive for  cough.   Cardiovascular: Negative.   Gastrointestinal: Negative.   Endocrine: Negative.   Genitourinary: Negative.   Musculoskeletal: Negative.   Skin: Negative.   Allergic/Immunologic: Negative.   Neurological: Negative.   Hematological: Negative.   Psychiatric/Behavioral: Negative.        Objective:   Physical Exam Vitals signs and nursing note reviewed.  Constitutional:      Appearance: Normal appearance. He is well-developed. He is obese. He is not ill-appearing.     Comments: Patient is pleasant and alert and just saw his ophthalmologist recently.  He has new glasses.  He continues to be followed by the orthopedist for his pain management.  HENT:     Head: Normocephalic and atraumatic.     Right Ear: Tympanic membrane, ear canal and external ear normal. There is no impacted cerumen.     Left Ear: Tympanic membrane, ear canal and external ear normal. There is no impacted cerumen.     Nose: Nose normal. No congestion or rhinorrhea.     Mouth/Throat:     Mouth: Mucous membranes are moist.     Pharynx: Oropharynx is clear. No oropharyngeal exudate or posterior oropharyngeal erythema.  Eyes:     General: No scleral icterus.       Right eye: No discharge.        Left eye: No discharge.  Extraocular Movements: Extraocular movements intact.     Conjunctiva/sclera: Conjunctivae normal.     Pupils: Pupils are equal, round, and reactive to light.     Comments: Recent eye visit with new glasses  Neck:     Musculoskeletal: Normal range of motion and neck supple.     Thyroid: No thyromegaly.     Vascular: No carotid bruit.     Trachea: No tracheal deviation.     Comments: No bruits thyromegaly or anterior cervical adenopathy Cardiovascular:     Rate and Rhythm: Normal rate and regular rhythm.     Heart sounds: Normal heart sounds. No murmur.     Comments: Heart is regular at 72/min with pulses that were palpable today in his feet. Pulmonary:     Effort: Pulmonary effort is  normal.     Breath sounds: Rhonchi present. No wheezing or rales.     Comments: Clear anteriorly and posteriorly but increased bronchial congestion with coughing.  No chest wall masses or axillary adenopathy Chest:     Chest wall: No tenderness.  Abdominal:     General: Abdomen is flat. Bowel sounds are normal.     Palpations: Abdomen is soft. There is no mass.     Tenderness: There is no abdominal tenderness.     Hernia: A hernia is present.     Comments: Generalized abdominal tenderness and swelling to the right.  Genitourinary:    Comments: Sees urologist regularly Musculoskeletal: Normal range of motion.        General: Deformity present. No tenderness.     Right lower leg: No edema.     Left lower leg: No edema.     Comments: Patient has a thoracic kyphosis and nuchal rigidity restricting range of motion.  Lymphadenopathy:     Cervical: No cervical adenopathy.  Skin:    General: Skin is warm and dry.     Findings: Lesion present. No rash.     Comments: Callus on the plantar surface of the right foot that is painful and tender and he has diabetes.  Neurological:     General: No focal deficit present.     Mental Status: He is alert and oriented to person, place, and time. Mental status is at baseline.     Cranial Nerves: No cranial nerve deficit.     Gait: Gait normal.     Deep Tendon Reflexes: Reflexes are normal and symmetric. Reflexes normal.  Psychiatric:        Mood and Affect: Mood normal.        Behavior: Behavior normal.        Thought Content: Thought content normal.        Judgment: Judgment normal.     Comments: Mood affect and behavior for this patient are normal for him.    BP (!) 112/59 (BP Location: Left Arm)   Pulse 75   Temp (!) 97.3 F (36.3 C) (Oral)   Ht '5\' 11"'$  (1.803 m)   Wt 185 lb (83.9 kg)   BMI 25.80 kg/m        Assessment & Plan:  1. Type 2 diabetes mellitus with diabetic neuropathy, without long-term current use of insulin  ([place]) -Continue with current medicine pending results of lab work.  Patient does not check blood sugars regularly at home because they have been doing so well. - CBC with Differential/Platelet - Bayer DCA Hb A1c Waived  2. ASCVD (arteriosclerotic cardiovascular disease) -Continue follow-up with Dr. Domenic Polite - CBC with Differential/Platelet -  Lipid panel  3. Essential hypertension, benign -Blood pressure is good today and he will continue with current treatment - BMP8+EGFR - CBC with Differential/Platelet - Hepatic function panel  4. Mixed hyperlipidemia -Continue with current treatment pending results of lab work - CBC with Differential/Platelet - Lipid panel  5. Vitamin D deficiency -Continue with vitamin D replacement pending results of lab work - CBC with Differential/Platelet - VITAMIN D 25 Hydroxy (Vit-D Deficiency, Fractures)  6. Gastroesophageal reflux disease, esophagitis presence not specified -Patient will continue with pantoprazole - CBC with Differential/Platelet - Hepatic function panel  7. Callus of foot -Referral to orthopedic foot specialist  8. Diabetes mellitus with peripheral vascular disease ([place]) -Walk and exercise regularly and stay on statin therapy  9. Abdominal aortic atherosclerosis ([facility]) -Continue statin therapy and therapeutic lifestyle changes  10. Acute bronchitis with COPD ([place]) -Z-Pak, Tessalon Perle, drink plenty of fluids and stay well-hydrated  Meds ordered this encounter  Medications  . meclizine (ANTIVERT) 25 MG tablet    Sig: Take 2 tablets (50 mg total) by mouth 3 (three) times daily as needed for dizziness.    Dispense:  60 tablet    Refill:  3  . diazepam (VALIUM) 10 MG tablet    Sig: Take 1 tablet (10 mg total) by mouth 2 (two) times daily as needed.    Dispense:  60 tablet    Refill:  5  . benzonatate (TESSALON) 100 MG capsule    Sig: Take 1 capsule (100 mg total) by mouth 2 (two) times daily as needed for cough.     Dispense:  30 capsule    Refill:  0  . azithromycin (ZITHROMAX) 250 MG tablet    Sig: As directed    Dispense:  6 tablet    Refill:  0   Patient Instructions                       Medicare Annual Wellness Visit  [facility] and the medical providers at [facility] strive to bring you the best medical care.  In doing so we not only want to address your current medical conditions and concerns but also to detect new conditions early and prevent illness, disease and health-related problems.    Medicare offers a yearly Wellness Visit which allows our clinical staff to assess your need for preventative services including immunizations, lifestyle education, counseling to decrease risk of preventable diseases and screening for fall risk and other medical concerns.    This visit is provided free of charge (no copay) for all Medicare recipients. The clinical pharmacists at [facility] have begun to conduct these Wellness Visits which will also include a thorough review of all your medications.    As you primary medical provider recommend that you make an appointment for your Annual Wellness Visit if you have not done so already this year.  You may set up this appointment before you leave today or you may call back [phone]) and schedule an appointment.  Please make sure when you call that you mention that you are scheduling your Annual Wellness Visit with the clinical pharmacist so that the appointment may be made for the proper length of time.     Continue current medications. Continue good therapeutic lifestyle changes which include good diet and exercise. Fall precautions discussed with patient. If an FOBT was given today- please return it to our front desk. If you are over 34 years old - you  may need Prevnar 13 or the adult Pneumonia vaccine.  **Flu shots are available--- please call and schedule a FLU-CLINIC appointment**  After your visit with [place]  today you will receive a survey in the mail or online from [employer] regarding your care with [place]. Please take a moment to fill this out. Your feedback is very important to [place] as you can help [place] better understand your patient needs as well as improve your experience and satisfaction. WE CARE ABOUT YOU!!!   Do not forget to call and schedule your visit with the cardiologist Continue to follow-up as needed with the urologist Continue to follow-up for pain management with Dr. Herma Mering This winter drink plenty of water and fluids and stay well-hydrated.  This is very important and helps keep you from getting infections. We will refill your diazepam meclizine and Tessalon Perles and if we prescribe an antibiotic you should take this regularly until completed We will call with lab work results as soon as they become available We will arrange for you to have an appointment to further evaluate the callus on the plantar surface of the right foot at the base of the fifth toe. We will refill the meclizine and Tessalon Perls.  Arrie Senate MD

## 2018-08-10 NOTE — Patient Instructions (Addendum)
Medicare Annual Wellness Visit  [facility] and the medical providers at [facility] strive to bring you the best medical care.  In doing so we not only want to address your current medical conditions and concerns but also to detect new conditions early and prevent illness, disease and health-related problems.    Medicare offers a yearly Wellness Visit which allows our clinical staff to assess your need for preventative services including immunizations, lifestyle education, counseling to decrease risk of preventable diseases and screening for fall risk and other medical concerns.    This visit is provided free of charge (no copay) for all Medicare recipients. The clinical pharmacists at [facility] have begun to conduct these Wellness Visits which will also include a thorough review of all your medications.    As you primary medical provider recommend that you make an appointment for your Annual Wellness Visit if you have not done so already this year.  You may set up this appointment before you leave today or you may call back ([phone]) and schedule an appointment.  Please make sure when you call that you mention that you are scheduling your Annual Wellness Visit with the clinical pharmacist so that the appointment may be made for the proper length of time.     Continue current medications. Continue good therapeutic lifestyle changes which include good diet and exercise. Fall precautions discussed with patient. If an FOBT was given today- please return it to our front desk. If you are over 76 years old - you may need Prevnar 69 or the adult Pneumonia vaccine.  **Flu shots are available--- please call and schedule a FLU-CLINIC appointment**  After your visit with [place] today you will receive a survey in the mail or online from [employer] regarding your care with [place]. Please take a moment to fill this out. Your feedback is very  important to [place] as you can help [place] better understand your patient needs as well as improve your experience and satisfaction. WE CARE ABOUT YOU!!!   Do not forget to call and schedule your visit with the cardiologist Continue to follow-up as needed with the urologist Continue to follow-up for pain management with Dr. Herma Mering This winter drink plenty of water and fluids and stay well-hydrated.  This is very important and helps keep you from getting infections. We will refill your diazepam meclizine and Tessalon Perles and if we prescribe an antibiotic you should take this regularly until completed We will call with lab work results as soon as they become available We will arrange for you to have an appointment to further evaluate the callus on the plantar surface of the right foot at the base of the fifth toe. We will refill the meclizine and Tessalon Perls.

## 2018-08-11 ENCOUNTER — Other Ambulatory Visit: Payer: Self-pay | Admitting: *Deleted

## 2018-08-11 ENCOUNTER — Telehealth: Payer: Self-pay

## 2018-08-11 DIAGNOSIS — R7989 Other specified abnormal findings of blood chemistry: Secondary | ICD-10-CM

## 2018-08-11 LAB — CBC WITH DIFFERENTIAL/PLATELET
Basophils Absolute: 0.2 10*3/uL (ref 0.0–0.2)
Basos: 1 %
EOS (ABSOLUTE): 0.5 10*3/uL — ABNORMAL HIGH (ref 0.0–0.4)
Eos: 3 %
Hematocrit: 36.6 % — ABNORMAL LOW (ref 37.5–51.0)
Hemoglobin: 12.5 g/dL — ABNORMAL LOW (ref 13.0–17.7)
Immature Grans (Abs): 0.1 10*3/uL (ref 0.0–0.1)
Immature Granulocytes: 1 %
Lymphocytes Absolute: 4.4 10*3/uL — ABNORMAL HIGH (ref 0.7–3.1)
Lymphs: 26 %
MCH: 31.5 pg (ref 26.6–33.0)
MCHC: 34.2 g/dL (ref 31.5–35.7)
MCV: 92 fL (ref 79–97)
Monocytes Absolute: 1.8 10*3/uL — ABNORMAL HIGH (ref 0.1–0.9)
Monocytes: 10 %
Neutrophils Absolute: 10.4 10*3/uL — ABNORMAL HIGH (ref 1.4–7.0)
Neutrophils: 59 %
PLATELETS: 503 10*3/uL — AB (ref 150–450)
RBC: 3.97 x10E6/uL — AB (ref 4.14–5.80)
RDW: 12.6 % (ref 11.6–15.4)
WBC: 17.3 10*3/uL — AB (ref 3.4–10.8)

## 2018-08-11 LAB — HEPATIC FUNCTION PANEL
ALT: 13 IU/L (ref 0–44)
AST: 16 IU/L (ref 0–40)
Albumin: 3.4 g/dL — ABNORMAL LOW (ref 3.7–4.7)
Alkaline Phosphatase: 67 IU/L (ref 39–117)
Bilirubin Total: 0.7 mg/dL (ref 0.0–1.2)
Bilirubin, Direct: 0.36 mg/dL (ref 0.00–0.40)
Total Protein: 6.2 g/dL (ref 6.0–8.5)

## 2018-08-11 LAB — BMP8+EGFR
BUN/Creatinine Ratio: 17 (ref 10–24)
BUN: 16 mg/dL (ref 8–27)
CHLORIDE: 100 mmol/L (ref 96–106)
CO2: 22 mmol/L (ref 20–29)
Calcium: 8.2 mg/dL — ABNORMAL LOW (ref 8.6–10.2)
Creatinine, Ser: 0.94 mg/dL (ref 0.76–1.27)
GFR calc Af Amer: 91 mL/min/{1.73_m2} (ref >59)
GFR calc non Af Amer: 79 mL/min/{1.73_m2} (ref >59)
GLUCOSE: 68 mg/dL (ref 65–99)
Potassium: 4.4 mmol/L (ref 3.5–5.2)
Sodium: 139 mmol/L (ref 134–144)

## 2018-08-11 LAB — LIPID PANEL
Chol/HDL Ratio: 4.7 ratio (ref 0.0–5.0)
Cholesterol, Total: 121 mg/dL (ref 100–199)
HDL: 26 mg/dL — ABNORMAL LOW (ref >39)
LDL Calculated: 67 mg/dL (ref 0–99)
Triglycerides: 142 mg/dL (ref 0–149)
VLDL Cholesterol Cal: 28 mg/dL (ref 5–40)

## 2018-08-11 LAB — VITAMIN D 25 HYDROXY (VIT D DEFICIENCY, FRACTURES): Vit D, 25-Hydroxy: 45.2 ng/mL (ref 30.0–100.0)

## 2018-08-11 NOTE — Telephone Encounter (Signed)
Several attempts have been made to contact patient without success. Patient will be placed on the inactive list.  If services are needed again.  Please contact [facility] at [phone].   Patient now has a PHQ score of 0 on his last appt with his PCP on 08-10-2018.    Information will be routed to the PCP and Dr. Modesta Messing

## 2018-08-16 ENCOUNTER — Ambulatory Visit (HOSPITAL_COMMUNITY): Payer: Medicare Other | Admitting: Internal Medicine

## 2018-08-21 ENCOUNTER — Other Ambulatory Visit: Payer: Self-pay

## 2018-08-21 ENCOUNTER — Observation Stay (HOSPITAL_COMMUNITY)
Admission: EM | Admit: 2018-08-21 | Discharge: 2018-08-23 | Disposition: A | Discharge status: Home / Self Care | Payer: Medicare Other | Attending: Internal Medicine | Admitting: Internal Medicine

## 2018-08-21 ENCOUNTER — Encounter (HOSPITAL_COMMUNITY): Payer: Self-pay | Admitting: Emergency Medicine

## 2018-08-21 DIAGNOSIS — E119 Type 2 diabetes mellitus without complications: Secondary | ICD-10-CM | POA: Insufficient documentation

## 2018-08-21 DIAGNOSIS — F419 Anxiety disorder, unspecified: Secondary | ICD-10-CM | POA: Diagnosis present

## 2018-08-21 DIAGNOSIS — K219 Gastro-esophageal reflux disease without esophagitis: Secondary | ICD-10-CM | POA: Diagnosis not present

## 2018-08-21 DIAGNOSIS — N390 Urinary tract infection, site not specified: Secondary | ICD-10-CM | POA: Diagnosis not present

## 2018-08-21 DIAGNOSIS — Z79899 Other long term (current) drug therapy: Secondary | ICD-10-CM | POA: Diagnosis not present

## 2018-08-21 DIAGNOSIS — Z7901 Long term (current) use of anticoagulants: Secondary | ICD-10-CM | POA: Insufficient documentation

## 2018-08-21 DIAGNOSIS — I70209 Unspecified atherosclerosis of native arteries of extremities, unspecified extremity: Secondary | ICD-10-CM | POA: Diagnosis present

## 2018-08-21 DIAGNOSIS — R531 Weakness: Secondary | ICD-10-CM | POA: Diagnosis not present

## 2018-08-21 DIAGNOSIS — I252 Old myocardial infarction: Secondary | ICD-10-CM | POA: Insufficient documentation

## 2018-08-21 DIAGNOSIS — R251 Tremor, unspecified: Secondary | ICD-10-CM | POA: Diagnosis not present

## 2018-08-21 DIAGNOSIS — I480 Paroxysmal atrial fibrillation: Secondary | ICD-10-CM | POA: Diagnosis present

## 2018-08-21 DIAGNOSIS — F17200 Nicotine dependence, unspecified, uncomplicated: Secondary | ICD-10-CM | POA: Diagnosis present

## 2018-08-21 DIAGNOSIS — Z951 Presence of aortocoronary bypass graft: Secondary | ICD-10-CM | POA: Diagnosis not present

## 2018-08-21 DIAGNOSIS — I10 Essential (primary) hypertension: Secondary | ICD-10-CM | POA: Diagnosis not present

## 2018-08-21 DIAGNOSIS — I2510 Atherosclerotic heart disease of native coronary artery without angina pectoris: Secondary | ICD-10-CM | POA: Diagnosis present

## 2018-08-21 DIAGNOSIS — E785 Hyperlipidemia, unspecified: Secondary | ICD-10-CM | POA: Diagnosis present

## 2018-08-21 DIAGNOSIS — R42 Dizziness and giddiness: Secondary | ICD-10-CM | POA: Diagnosis not present

## 2018-08-21 DIAGNOSIS — E860 Dehydration: Secondary | ICD-10-CM | POA: Insufficient documentation

## 2018-08-21 DIAGNOSIS — F17210 Nicotine dependence, cigarettes, uncomplicated: Secondary | ICD-10-CM | POA: Diagnosis not present

## 2018-08-21 DIAGNOSIS — E1151 Type 2 diabetes mellitus with diabetic peripheral angiopathy without gangrene: Secondary | ICD-10-CM | POA: Diagnosis present

## 2018-08-21 DIAGNOSIS — D72829 Elevated white blood cell count, unspecified: Secondary | ICD-10-CM | POA: Diagnosis present

## 2018-08-21 LAB — COMPREHENSIVE METABOLIC PANEL
ALBUMIN: 3.2 g/dL — AB (ref 3.5–5.0)
ALT: 14 U/L (ref 0–44)
AST: 21 U/L (ref 15–41)
Alkaline Phosphatase: 42 U/L (ref 38–126)
Anion gap: 10 (ref 5–15)
BUN: 15 mg/dL (ref 8–23)
CO2: 24 mmol/L (ref 22–32)
Calcium: 7.7 mg/dL — ABNORMAL LOW (ref 8.9–10.3)
Chloride: 103 mmol/L (ref 98–111)
Creatinine, Ser: 0.87 mg/dL (ref 0.61–1.24)
GFR calc Af Amer: >60 mL/min (ref >60)
GFR calc non Af Amer: >60 mL/min (ref >60)
Glucose, Bld: 102 mg/dL — ABNORMAL HIGH (ref 70–99)
Potassium: 3.8 mmol/L (ref 3.5–5.1)
Sodium: 137 mmol/L (ref 135–145)
Total Bilirubin: 0.7 mg/dL (ref 0.3–1.2)
Total Protein: 7.1 g/dL (ref 6.5–8.1)

## 2018-08-21 LAB — CBC WITH DIFFERENTIAL/PLATELET
Abs Immature Granulocytes: 0.06 10*3/uL (ref 0.00–0.07)
BASOS ABS: 0.1 10*3/uL (ref 0.0–0.1)
BASOS PCT: 1 %
Eosinophils Absolute: 0.2 10*3/uL (ref 0.0–0.5)
Eosinophils Relative: 1 %
HCT: 41.2 % (ref 39.0–52.0)
Hemoglobin: 13.4 g/dL (ref 13.0–17.0)
Immature Granulocytes: 0 %
Lymphocytes Relative: 31 %
Lymphs Abs: 5.8 10*3/uL — ABNORMAL HIGH (ref 0.7–4.0)
MCH: 29.4 pg (ref 26.0–34.0)
MCHC: 32.5 g/dL (ref 30.0–36.0)
MCV: 90.4 fL (ref 80.0–100.0)
Monocytes Absolute: 1.4 10*3/uL — ABNORMAL HIGH (ref 0.1–1.0)
Monocytes Relative: 7 %
NRBC: 0 % (ref 0.0–0.2)
Neutro Abs: 11 10*3/uL — ABNORMAL HIGH (ref 1.7–7.7)
Neutrophils Relative %: 60 %
Platelets: 533 10*3/uL — ABNORMAL HIGH (ref 150–400)
RBC: 4.56 MIL/uL (ref 4.22–5.81)
RDW: 13.8 % (ref 11.5–15.5)
WBC: 18.6 10*3/uL — ABNORMAL HIGH (ref 4.0–10.5)

## 2018-08-21 LAB — MAGNESIUM: Magnesium: 0.3 mg/dL — CL (ref 1.7–2.4)

## 2018-08-21 LAB — TROPONIN I: Troponin I: <0.03 ng/mL (ref <0.03)

## 2018-08-21 MED ORDER — MAGNESIUM SULFATE 4 GM/100ML IV SOLN
4.0000 g | Freq: Once | INTRAVENOUS | Status: AC
  Administered 2018-08-22: 4 g via INTRAVENOUS
  Filled 2018-08-21: qty 100

## 2018-08-21 MED ORDER — SODIUM CHLORIDE 0.9 % IV BOLUS
500.0000 mL | Freq: Once | INTRAVENOUS | Status: AC
  Administered 2018-08-21: 500 mL via INTRAVENOUS

## 2018-08-21 MED ORDER — LORAZEPAM 2 MG/ML IJ SOLN
0.5000 mg | Freq: Once | INTRAMUSCULAR | Status: AC
  Administered 2018-08-21: 0.5 mg via INTRAVENOUS
  Filled 2018-08-21: qty 1

## 2018-08-21 NOTE — ED Triage Notes (Signed)
Pt c/o generalized weakness x 3 weeks that has gotten progressively worse last couple of days. Pt also states when he stands he gets swimmy headed.

## 2018-08-21 NOTE — ED Notes (Signed)
ED Provider at bedside. 
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
## 2018-08-21 NOTE — ED Notes (Signed)
Date and time results received: 08/21/18 2326  Test: Magnesium Critical Value: 0.3  Name of Provider Notified: Lita Mains, MD

## 2018-08-22 ENCOUNTER — Encounter (HOSPITAL_COMMUNITY): Payer: Self-pay | Admitting: Internal Medicine

## 2018-08-22 DIAGNOSIS — E1151 Type 2 diabetes mellitus with diabetic peripheral angiopathy without gangrene: Secondary | ICD-10-CM | POA: Diagnosis not present

## 2018-08-22 DIAGNOSIS — I480 Paroxysmal atrial fibrillation: Secondary | ICD-10-CM | POA: Diagnosis not present

## 2018-08-22 DIAGNOSIS — D72829 Elevated white blood cell count, unspecified: Secondary | ICD-10-CM

## 2018-08-22 DIAGNOSIS — I25118 Atherosclerotic heart disease of native coronary artery with other forms of angina pectoris: Secondary | ICD-10-CM

## 2018-08-22 LAB — CBC WITH DIFFERENTIAL/PLATELET
Abs Immature Granulocytes: 0.09 10*3/uL — ABNORMAL HIGH (ref 0.00–0.07)
Basophils Absolute: 0.2 10*3/uL — ABNORMAL HIGH (ref 0.0–0.1)
Basophils Relative: 1 %
Eosinophils Absolute: 0.7 10*3/uL — ABNORMAL HIGH (ref 0.0–0.5)
Eosinophils Relative: 3 %
HCT: 38.1 % — ABNORMAL LOW (ref 39.0–52.0)
Hemoglobin: 12.7 g/dL — ABNORMAL LOW (ref 13.0–17.0)
Immature Granulocytes: 0 %
Lymphocytes Relative: 30 %
Lymphs Abs: 6.1 10*3/uL — ABNORMAL HIGH (ref 0.7–4.0)
MCH: 30.2 pg (ref 26.0–34.0)
MCHC: 33.3 g/dL (ref 30.0–36.0)
MCV: 90.7 fL (ref 80.0–100.0)
MONO ABS: 1.8 10*3/uL — AB (ref 0.1–1.0)
MONOS PCT: 9 %
Neutro Abs: 11.7 10*3/uL — ABNORMAL HIGH (ref 1.7–7.7)
Neutrophils Relative %: 57 %
Platelets: 456 10*3/uL — ABNORMAL HIGH (ref 150–400)
RBC: 4.2 MIL/uL — ABNORMAL LOW (ref 4.22–5.81)
RDW: 13.8 % (ref 11.5–15.5)
WBC: 20.5 10*3/uL — ABNORMAL HIGH (ref 4.0–10.5)
nRBC: 0 % (ref 0.0–0.2)

## 2018-08-22 LAB — MAGNESIUM: Magnesium: 1.5 mg/dL — ABNORMAL LOW (ref 1.7–2.4)

## 2018-08-22 LAB — GLUCOSE, CAPILLARY
GLUCOSE-CAPILLARY: 65 mg/dL — AB (ref 70–99)
Glucose-Capillary: 36 mg/dL — CL (ref 70–99)
Glucose-Capillary: 176 mg/dL — ABNORMAL HIGH (ref 70–99)
Glucose-Capillary: 54 mg/dL — ABNORMAL LOW (ref 70–99)
Glucose-Capillary: 110 mg/dL — ABNORMAL HIGH (ref 70–99)

## 2018-08-22 LAB — URINALYSIS, ROUTINE W REFLEX MICROSCOPIC
Bilirubin Urine: NEGATIVE
Glucose, UA: NEGATIVE mg/dL
Hgb urine dipstick: NEGATIVE
Ketones, ur: NEGATIVE mg/dL
Nitrite: NEGATIVE
Protein, ur: 100 mg/dL — AB
Specific Gravity, Urine: 1.015 (ref 1.005–1.030)
WBC, UA: >50 WBC/hpf — ABNORMAL HIGH (ref 0–5)
pH: 6 (ref 5.0–8.0)

## 2018-08-22 LAB — BASIC METABOLIC PANEL
Anion gap: 8 (ref 5–15)
BUN: 15 mg/dL (ref 8–23)
CO2: 24 mmol/L (ref 22–32)
Calcium: 7.5 mg/dL — ABNORMAL LOW (ref 8.9–10.3)
Chloride: 108 mmol/L (ref 98–111)
Creatinine, Ser: 0.89 mg/dL (ref 0.61–1.24)
GFR calc Af Amer: >60 mL/min (ref >60)
GFR calc non Af Amer: >60 mL/min (ref >60)
Glucose, Bld: 83 mg/dL (ref 70–99)
Potassium: 3.6 mmol/L (ref 3.5–5.1)
Sodium: 140 mmol/L (ref 135–145)

## 2018-08-22 LAB — PHOSPHORUS: Phosphorus: 3.4 mg/dL (ref 2.5–4.6)

## 2018-08-22 MED ORDER — ACETAMINOPHEN 650 MG RE SUPP: 650.0000 mg | Freq: Four times a day (QID) | RECTAL | Status: DC | PRN

## 2018-08-22 MED ORDER — APIXABAN 5 MG PO TABS
5.0000 mg | ORAL_TABLET | Freq: Two times a day (BID) | ORAL | Status: DC
  Administered 2018-08-22 – 2018-08-23 (×3): 5 mg via ORAL
  Filled 2018-08-22 (×3): qty 1

## 2018-08-22 MED ORDER — BENZONATATE 100 MG PO CAPS: 100.0000 mg | ORAL_CAPSULE | Freq: Two times a day (BID) | ORAL | Status: DC | PRN

## 2018-08-22 MED ORDER — POTASSIUM CHLORIDE CRYS ER 10 MEQ PO TBCR
10.0000 meq | EXTENDED_RELEASE_TABLET | Freq: Every day | ORAL | Status: DC
  Administered 2018-08-22 – 2018-08-23 (×2): 10 meq via ORAL
  Filled 2018-08-22 (×2): qty 1

## 2018-08-22 MED ORDER — OXYCODONE HCL 5 MG PO TABS: 5.0000 mg | ORAL_TABLET | ORAL | Status: DC | PRN

## 2018-08-22 MED ORDER — ADULT MULTIVITAMIN W/MINERALS CH
1.0000 | ORAL_TABLET | Freq: Every day | ORAL | Status: DC
  Administered 2018-08-22 – 2018-08-23 (×2): 1 via ORAL
  Filled 2018-08-22 (×2): qty 1

## 2018-08-22 MED ORDER — INSULIN ASPART 100 UNIT/ML ~~LOC~~ SOLN: 0.0000 [IU] | Freq: Three times a day (TID) | SUBCUTANEOUS | Status: DC

## 2018-08-22 MED ORDER — POTASSIUM CHLORIDE IN NACL 20-0.45 MEQ/L-% IV SOLN
INTRAVENOUS | Status: DC
  Administered 2018-08-22: 04:00:00 via INTRAVENOUS
  Filled 2018-08-22 (×3): qty 1000

## 2018-08-22 MED ORDER — ACETAMINOPHEN 325 MG PO TABS: 650.0000 mg | ORAL_TABLET | Freq: Four times a day (QID) | ORAL | Status: DC | PRN

## 2018-08-22 MED ORDER — GLUCOSE 40 % PO GEL: 2.0000 | ORAL | Status: DC

## 2018-08-22 MED ORDER — NICOTINE 21 MG/24HR TD PT24: 21.0000 mg | MEDICATED_PATCH | Freq: Every day | TRANSDERMAL | Status: DC | PRN

## 2018-08-22 MED ORDER — FUROSEMIDE 20 MG PO TABS
20.0000 mg | ORAL_TABLET | Freq: Every day | ORAL | Status: DC
  Filled 2018-08-22: qty 1

## 2018-08-22 MED ORDER — PANTOPRAZOLE SODIUM 40 MG PO TBEC
40.0000 mg | DELAYED_RELEASE_TABLET | Freq: Two times a day (BID) | ORAL | Status: DC
  Administered 2018-08-22 – 2018-08-23 (×3): 40 mg via ORAL
  Filled 2018-08-22 (×3): qty 1

## 2018-08-22 MED ORDER — OXYCODONE-ACETAMINOPHEN 5-325 MG PO TABS
1.0000 | ORAL_TABLET | ORAL | Status: DC | PRN
  Administered 2018-08-22 – 2018-08-23 (×4): 1 via ORAL
  Filled 2018-08-22 (×4): qty 1

## 2018-08-22 MED ORDER — DEXTROSE 10 % IV SOLN
INTRAVENOUS | Status: DC
  Administered 2018-08-22: 14:00:00 via INTRAVENOUS
  Filled 2018-08-22: qty 1000

## 2018-08-22 MED ORDER — FENOFIBRATE 160 MG PO TABS
160.0000 mg | ORAL_TABLET | Freq: Every day | ORAL | Status: DC
  Administered 2018-08-22 – 2018-08-23 (×2): 160 mg via ORAL
  Filled 2018-08-22 (×2): qty 1

## 2018-08-22 MED ORDER — MAGNESIUM SULFATE 4 GM/100ML IV SOLN
4.0000 g | Freq: Once | INTRAVENOUS | Status: AC
  Administered 2018-08-22: 4 g via INTRAVENOUS
  Filled 2018-08-22: qty 100

## 2018-08-22 MED ORDER — METOPROLOL TARTRATE 25 MG PO TABS
25.0000 mg | ORAL_TABLET | Freq: Two times a day (BID) | ORAL | Status: DC
  Administered 2018-08-22 – 2018-08-23 (×3): 25 mg via ORAL
  Filled 2018-08-22 (×3): qty 1

## 2018-08-22 MED ORDER — MECLIZINE HCL 12.5 MG PO TABS
50.0000 mg | ORAL_TABLET | Freq: Three times a day (TID) | ORAL | Status: DC | PRN
  Administered 2018-08-22: 50 mg via ORAL
  Filled 2018-08-22: qty 4

## 2018-08-22 MED ORDER — ATORVASTATIN CALCIUM 20 MG PO TABS
20.0000 mg | ORAL_TABLET | Freq: Every morning | ORAL | Status: DC
  Administered 2018-08-22 – 2018-08-23 (×2): 20 mg via ORAL
  Filled 2018-08-22 (×2): qty 1

## 2018-08-22 MED ORDER — ENSURE ENLIVE PO LIQD
237.0000 mL | Freq: Two times a day (BID) | ORAL | Status: DC
  Administered 2018-08-22 – 2018-08-23 (×2): 237 mL via ORAL

## 2018-08-22 MED ORDER — SODIUM CHLORIDE 0.9 % IV SOLN
1.0000 g | INTRAVENOUS | Status: DC
  Administered 2018-08-22 – 2018-08-23 (×2): 1 g via INTRAVENOUS
  Filled 2018-08-22 (×2): qty 10
  Filled 2018-08-22: qty 1
  Filled 2018-08-22: qty 10

## 2018-08-22 MED ORDER — GLIMEPIRIDE 2 MG PO TABS
2.0000 mg | ORAL_TABLET | Freq: Every day | ORAL | Status: DC
  Administered 2018-08-22: 2 mg via ORAL
  Filled 2018-08-22: qty 1

## 2018-08-22 MED ORDER — SODIUM CHLORIDE 0.9 % IV SOLN
INTRAVENOUS | Status: DC
  Administered 2018-08-22: 09:00:00 via INTRAVENOUS

## 2018-08-22 MED ORDER — ISOSORBIDE MONONITRATE ER 60 MG PO TB24
30.0000 mg | ORAL_TABLET | Freq: Every day | ORAL | Status: DC
  Administered 2018-08-22 – 2018-08-23 (×2): 30 mg via ORAL
  Filled 2018-08-22 (×2): qty 1

## 2018-08-22 MED ORDER — DEXTROSE 50 % IV SOLN
INTRAVENOUS | Status: AC
  Administered 2018-08-22: 13:00:00
  Filled 2018-08-22: qty 50

## 2018-08-22 MED ORDER — LACTULOSE 10 GM/15ML PO SOLN: 10.0000 g | Freq: Every day | ORAL | Status: DC | PRN

## 2018-08-22 MED ORDER — OMEGA-3-ACID ETHYL ESTERS 1 G PO CAPS
1.0000 g | ORAL_CAPSULE | Freq: Every day | ORAL | Status: DC
  Administered 2018-08-22 – 2018-08-23 (×2): 1 g via ORAL
  Filled 2018-08-22 (×2): qty 1

## 2018-08-22 MED ORDER — POLYETHYLENE GLYCOL 3350 17 G PO PACK
17.0000 g | PACK | Freq: Every day | ORAL | Status: DC
  Filled 2018-08-22: qty 1

## 2018-08-22 MED ORDER — DIAZEPAM 5 MG PO TABS: 10.0000 mg | ORAL_TABLET | Freq: Two times a day (BID) | ORAL | Status: DC | PRN

## 2018-08-22 MED ORDER — MORPHINE SULFATE ER 30 MG PO TBCR
30.0000 mg | EXTENDED_RELEASE_TABLET | Freq: Three times a day (TID) | ORAL | Status: DC
  Administered 2018-08-22 – 2018-08-23 (×4): 30 mg via ORAL
  Filled 2018-08-22 (×4): qty 1

## 2018-08-22 NOTE — Progress Notes (Addendum)
Initial Nutrition Assessment  DOCUMENTATION CODES:   Severe malnutrition in context of acute illness   INTERVENTION:  General nutrition education provided  Regular diet   Ensure Enlive po BID, each supplement provides 350 kcal and 20 grams of protein -Strawberry  MVI due to severe wt loss and increased risk for micronutrient deficiency associated with poor oral intake  Recommend daily vitamin C- 90 mg daily (due to ongoing tobacco smoking)    NUTRITION DIAGNOSIS:   Severe Malnutrition related to poor appetite, acute illness(UTI, hypomagnesemia) as evidenced by 15 percent weight loss, per patient/family report, meal completion < 50%, energy intake >/= 5 days, moderate fat depletion, moderate muscle depletion.   GOAL:   Patient will meet greater than or equal to 90% of their needs   MONITOR:   PO intake, Supplement acceptance, Weight trends, Labs  REASON FOR ASSESSMENT:   Malnutrition Screening Tool    ASSESSMENT: Patient is a 78 yo male from home-lives with spouse. History of CAD, MI, DM-2, HTN, GERD, Chronic pain and non-compliance. He presents complaining of weakness. Lab workup: hypomagnesemia, UTI and leukocytosis. Smokes tobacco.  Patient reports poor appetite for > 1 month and says he sometimes forgets to eat because he doesn't get hungry. Discussed with patient importance of appropriate nutrition (food and fluid) intake to maintain muscle mass, meet metabolic demands and prevent dehydration.  Meal observation today 25% spaghetti, 75% bread and 100% jello and 8 oz regular cola. Able to feed himself. We talked about ways to consume nutrition on a schedule regardless of hunger signalling and the option of liquid nutrition which may be easier for him due to the poor appetite.   Patient self-reports 30 lb wt loss since mid December. According to hospital records he weighed 200 lb (07/05/18) -currently 170 lb based on nursing report. Severe loss of 15% <60 days. Physical exam  findings- moderate muscle and fat mass depletions.   Medications reviewed and include: Lipitor, SSI, Lopressor, Lovaza, Miralax, KCL, Rocephin and Nicoderm.   Labs: BMP Latest Ref Rng & Units 08/22/2018 08/21/2018 08/10/2018  Glucose 70 - 99 mg/dL 83 102(H) 68  BUN 8 - 23 mg/dL 15 15 16   Creatinine 0.61 - 1.24 mg/dL 0.89 0.87 0.94  BUN/Creat Ratio 10 - 24 - - 17  Sodium 135 - 145 mmol/L 140 137 139  Potassium 3.5 - 5.1 mmol/L 3.6 3.8 4.4  Chloride 98 - 111 mmol/L 108 103 100  CO2 22 - 32 mmol/L 24 24 22   Calcium 8.9 - 10.3 mg/dL 7.5(L) 7.7(L) 8.2(L)   NUTRITION - FOCUSED PHYSICAL EXAM:    Most Recent Value  Orbital Region  Moderate depletion  Upper Arm Region  Severe depletion  Thoracic and Lumbar Region  Mild depletion  Buccal Region  Moderate depletion  Temple Region  Moderate depletion  Clavicle Bone Region  Severe depletion  Clavicle and Acromion Bone Region  Severe depletion  Scapular Bone Region  Moderate depletion  Dorsal Hand  Moderate depletion      Diet Order:   Diet Order            Diet regular Room service appropriate? Yes; Fluid consistency: Thin  Diet effective now              EDUCATION NEEDS:   Skin:  Skin Assessment: Reviewed RN Assessment  Last BM:  2/3   Height:   Ht Readings from Last 1 Encounters:  08/22/18 5\' 11"  (1.803 m)    Weight:   Wt Readings from  Last 1 Encounters:  08/22/18 77.4 kg    Ideal Body Weight:  78 kg  BMI:  Body mass index is 23.8 kg/m.  Estimated Nutritional Needs:   Kcal:  2156-2300 (28-30 kcal/kg/bw)  Protein:  92-100 (1.2-1.3 gr/kg/bw)  Fluid:  1.9 liters daily  Colman Cater MS,RD,CSG,LDN Office: [phone] Pager: [phone]
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
## 2018-08-22 NOTE — Plan of Care (Signed)
  Problem: Acute Rehab PT Goals(only PT should resolve) Goal: Patient Will Transfer Sit To/From Stand Outcome: Progressing Flowsheets (Taken 08/22/2018 1600) Patient will transfer sit to/from stand: with modified independence Goal: Pt Will Transfer Bed To Chair/Chair To Bed Outcome: Progressing Flowsheets (Taken 08/22/2018 1600) Pt will Transfer Bed to Chair/Chair to Bed: with modified independence Goal: Pt Will Ambulate Outcome: Progressing Flowsheets (Taken 08/22/2018 1600) Pt will Ambulate: with modified independence; > 125 feet; with cane   4:00 PM, 08/22/18 Lonell Grandchild, MPT Physical Therapist with [facility] 336 [phone] office [phone] mobile phone

## 2018-08-22 NOTE — Care Management Obs Status (Signed)
[place] NOTIFICATION   Patient Details  Name: MARSALIS BEAULIEU MRN: [id number] Date of Birth: 1941/01/30   Medicare Observation Status Notification Given:  Yes    Sherald Barge, RN 08/22/2018, 10:17 AM

## 2018-08-22 NOTE — Plan of Care (Signed)

## 2018-08-22 NOTE — Progress Notes (Addendum)
PROGRESS NOTE    Dustin Dominguez  ZDG:[id number]  DOB: 09-10-1940  DOA: 08/21/2018 PCP: Chipper Herb, MD   Brief Admission Hx: 78 year old gentleman with coronary artery disease, type 2 diabetes mellitus, GERD, hyperlipidemia who presented to the emergency department with lysed weakness.  He was noted to have dehydration and a UTI.  He is being admitted for further management.  MDM/Assessment & Plan:   1. Generalized weakness-suspect this is multifactorial but will work on correcting his metabolic abnormalities and treating infection.  PT evaluation.  Correcting electrolytes. 2. Chronic leukocytosis-exacerbated by UTI which is being treated. 3. E. coli UTI- multidrug-resistant organism but has been sensitive to ceftriaxone in the recent past.  Continue ceftriaxone now pending culture and sensitivity results. 4. Severe hypoglycemia- patient had received glimepiride early this morning.  This has been discontinued.  I have started him on a dextrose infusion and serial frequent CBG testing.  I would not restart him on glimepiride at discharge as he is at high risk for recurrent hypoglycemia.  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  Continue to monitor CBG closely. 5. Tobacco use disorder-he was counseled on tobacco cessation but at this time he is not interested in quitting.  We have offered a nicotine replacement patch to use in the hospital to curb nicotine cravings. 6. Coronary artery disease-she has no current symptoms of chest pain or shortness of breath and we have resumed his anticoagulation statin and lipid-lowering agents.  Continue his home beta-blocker therapy. 7. Hypomagnesemia-severe deficit which is being replaced IV and a follow-up magnesium has been ordered today and tomorrow morning.  Continue to replace as needed. 8. Essential hypertension-blood pressures controlled with metoprolol 25 mg twice daily. 9. Type 2 diabetes mellitus with vascular  complications- his home glimepiride was restarted at admission and is subsequently been discontinued due to severe hypoglycemia. 10. Paroxysmal atrial fibrillation-he is fully anticoagulated with apixaban and his rate is controlled with metoprolol. 11. Generalized anxiety disorder-this is treated with diazepam twice daily as needed. 12. Vertigo-he has meclizine ordered as needed. 13. GERD- Protonix ordered for GI protection. 14. Chronic pain- his home pain medications were resumed, I added scheduled laxatives.  DVT prophylaxis: Apixaban Code Status: Full Family Communication: Patient Disposition Plan: Inpatient   Consultants:  N/A  Procedures:  N/A  Antimicrobials:  Ceftriaxone 2/3 >  Subjective:  The patient reports that he is generally feeling weak.  He was eating breakfast with no difficulty.  He says that his dizziness has mostly resolved.  Objective: Vitals:   08/21/18 2330 08/22/18 0000 08/22/18 0041 08/22/18 0628  BP: 129/71 132/69 133/72 122/66  Pulse: 66 71 72 69  Resp: (!) 21 (!) 24  20  Temp:   98.4 F (36.9 C) 97.6 F (36.4 C)  TempSrc:   Oral Oral  SpO2: 96% 97% 98% 99%  Weight:   77.4 kg   Height:   5\' 11"  (1.803 m)     Intake/Output Summary (Last 24 hours) at 08/22/2018 1229 Last data filed at 08/22/2018 0900 Gross per 24 hour  Intake 1124.2 ml  Output 300 ml  Net 824.2 ml   Filed Weights   08/21/18 2124 08/22/18 0041  Weight: 83.9 kg 77.4 kg     REVIEW OF SYSTEMS  As per history otherwise all reviewed and reported negative  Exam:  General exam: Chronically ill-appearing male he is awake and alert in no apparent distress. Respiratory system: Clear. No increased work of breathing. Cardiovascular system: S1 &  S2 heard.  Irregularly irregular.  No JVD, murmurs, gallops. Gastrointestinal system: Abdomen is nondistended, soft and nontender. Normal bowel sounds heard. Central nervous system: Alert and oriented. No focal neurological  deficits. Extremities: no cyanosis or clubbing.  Data Reviewed: Basic Metabolic Panel: Recent Labs  Lab 08/21/18 2208 08/21/18 2234 08/22/18 0543  NA 137  --  140  K 3.8  --  3.6  CL 103  --  108  CO2 24  --  24  GLUCOSE 102*  --  83  BUN 15  --  15  CREATININE 0.87  --  0.89  CALCIUM 7.7*  --  7.5*  MG 0.3*  --   --   PHOS  --  3.4  --    Liver Function Tests: Recent Labs  Lab 08/21/18 2208  AST 21  ALT 14  ALKPHOS 42  BILITOT 0.7  PROT 7.1  ALBUMIN 3.2*   No results for input(s): LIPASE, AMYLASE in the last 168 hours. No results for input(s): AMMONIA in the last 168 hours. CBC: Recent Labs  Lab 08/21/18 2208 08/22/18 0543  WBC 18.6* 20.5*  NEUTROABS 11.0* 11.7*  HGB 13.4 12.7*  HCT 41.2 38.1*  MCV 90.4 90.7  PLT 533* 456*   Cardiac Enzymes: Recent Labs  Lab 08/21/18 2208  TROPONINI <0.03   CBG (last 3)  Recent Labs    08/22/18 1208 08/22/18 1211  GLUCAP 27* 36*   No results found for this or any previous visit (from the past 240 hour(s)).   Studies: No results found.   Scheduled Meds: . apixaban  5 mg Oral BID  . atorvastatin  20 mg Oral q morning - 10a  . dextrose      . fenofibrate  160 mg Oral Daily  . isosorbide mononitrate  30 mg Oral Daily  . metoprolol tartrate  25 mg Oral BID  . morphine  30 mg Oral TID  . omega-3 acid ethyl esters  1 g Oral Daily  . pantoprazole  40 mg Oral BID  . polyethylene glycol  17 g Oral Daily  . potassium chloride  10 mEq Oral Daily   Continuous Infusions: . sodium chloride    . cefTRIAXone (ROCEPHIN)  IV 1 g (08/22/18 0341)  . dextrose      Principal Problem:   Hypomagnesemia Active Problems:   Coronary atherosclerosis of native coronary artery   Tobacco use disorder   Essential hypertension, benign   Diabetes mellitus type 2 with atherosclerosis of arteries of extremities (HCC)   HLD (hyperlipidemia)   GERD (gastroesophageal reflux disease)   Leukocytosis   UTI (urinary tract  infection)   AF (paroxysmal atrial fibrillation) ([place])   Anxiety  Time spent:   Irwin Brakeman, MD [facility] 08/22/2018, 12:29 PM    LOS: 0 days  How to contact the [facility] Attending or Consulting provider [place] or covering provider during after hours [place], for this patient?  1. Check the care team in [facility] and look for a) attending/consulting [facility] provider listed and b) the [facility] team listed 2. Log into [email or web address] and use [place]'s universal password to access. If you do not have the password, please contact the hospital operator. 3. Locate the [facility] provider you are looking for under [facility] and page to a number that you can be directly reached. 4. If you still have difficulty reaching the provider, please page the [facility] (Director on Call) for the Hospitalists listed on amion for assistance.

## 2018-08-22 NOTE — Care Management Note (Signed)
Case Management Note  Patient Details  Name: Dustin Dominguez MRN: [id number] Date of Birth: 28-May-1941  Subjective/Objective:    Admitted with hypomagnesem. Pt from home, lives with wife. Pt has insurance and PCP. Pt was here in Dec and DC'd with [facility] referral through [facility] for vestibular rehab. Pt was DC'd from [facility] end of Dec. Pt has no DME pta but could use cane. Pt needs PT eval prior to DC. Reports his vertigo has no improved. Anticipate pt will need referral for [place] at DC.               Action/Plan: PT eval pending. CM will cont to follow for DC planning needs.   Expected Discharge Date:     08/24/2018             Expected Discharge Plan:  [place]  In-House Referral:  NA  Discharge planning Services  CM Consult  Post Acute Care Choice:  Home Health Choice offered to:  Patient  Status of Service:  In process, will continue to follow  Sherald Barge, RN 08/22/2018, 3:05 PM

## 2018-08-23 DIAGNOSIS — I480 Paroxysmal atrial fibrillation: Secondary | ICD-10-CM

## 2018-08-23 DIAGNOSIS — F419 Anxiety disorder, unspecified: Secondary | ICD-10-CM

## 2018-08-23 DIAGNOSIS — F17200 Nicotine dependence, unspecified, uncomplicated: Secondary | ICD-10-CM

## 2018-08-23 DIAGNOSIS — N390 Urinary tract infection, site not specified: Secondary | ICD-10-CM

## 2018-08-23 DIAGNOSIS — R531 Weakness: Secondary | ICD-10-CM

## 2018-08-23 DIAGNOSIS — I70209 Unspecified atherosclerosis of native arteries of extremities, unspecified extremity: Secondary | ICD-10-CM

## 2018-08-23 DIAGNOSIS — I10 Essential (primary) hypertension: Secondary | ICD-10-CM

## 2018-08-23 DIAGNOSIS — E1151 Type 2 diabetes mellitus with diabetic peripheral angiopathy without gangrene: Secondary | ICD-10-CM

## 2018-08-23 DIAGNOSIS — K219 Gastro-esophageal reflux disease without esophagitis: Secondary | ICD-10-CM

## 2018-08-23 LAB — CBC WITH DIFFERENTIAL/PLATELET
Abs Immature Granulocytes: 0.07 10*3/uL (ref 0.00–0.07)
Basophils Absolute: 0.2 10*3/uL — ABNORMAL HIGH (ref 0.0–0.1)
Basophils Relative: 1 %
Eosinophils Absolute: 1.1 10*3/uL — ABNORMAL HIGH (ref 0.0–0.5)
Eosinophils Relative: 6 %
HCT: 41.5 % (ref 39.0–52.0)
Hemoglobin: 13.1 g/dL (ref 13.0–17.0)
Immature Granulocytes: 0 %
Lymphocytes Relative: 21 %
Lymphs Abs: 3.5 10*3/uL (ref 0.7–4.0)
MCH: 30.6 pg (ref 26.0–34.0)
MCHC: 31.6 g/dL (ref 30.0–36.0)
MCV: 97 fL (ref 80.0–100.0)
Monocytes Absolute: 1.5 10*3/uL — ABNORMAL HIGH (ref 0.1–1.0)
Monocytes Relative: 9 %
Neutro Abs: 10.6 10*3/uL — ABNORMAL HIGH (ref 1.7–7.7)
Neutrophils Relative %: 63 %
Platelets: 458 10*3/uL — ABNORMAL HIGH (ref 150–400)
RBC: 4.28 MIL/uL (ref 4.22–5.81)
RDW: 14.2 % (ref 11.5–15.5)
WBC: 16.9 10*3/uL — ABNORMAL HIGH (ref 4.0–10.5)
nRBC: 0 % (ref 0.0–0.2)

## 2018-08-23 LAB — GLUCOSE, CAPILLARY
Glucose-Capillary: 115 mg/dL — ABNORMAL HIGH (ref 70–99)
Glucose-Capillary: 138 mg/dL — ABNORMAL HIGH (ref 70–99)
Glucose-Capillary: 140 mg/dL — ABNORMAL HIGH (ref 70–99)
Glucose-Capillary: 187 mg/dL — ABNORMAL HIGH (ref 70–99)

## 2018-08-23 LAB — COMPREHENSIVE METABOLIC PANEL
ALT: 11 U/L (ref 0–44)
AST: 20 U/L (ref 15–41)
Albumin: 2.8 g/dL — ABNORMAL LOW (ref 3.5–5.0)
Alkaline Phosphatase: 39 U/L (ref 38–126)
Anion gap: 6 (ref 5–15)
BUN: 16 mg/dL (ref 8–23)
CO2: 25 mmol/L (ref 22–32)
Calcium: 7.3 mg/dL — ABNORMAL LOW (ref 8.9–10.3)
Chloride: 106 mmol/L (ref 98–111)
Creatinine, Ser: 0.8 mg/dL (ref 0.61–1.24)
GFR calc Af Amer: >60 mL/min (ref >60)
GFR calc non Af Amer: >60 mL/min (ref >60)
Glucose, Bld: 123 mg/dL — ABNORMAL HIGH (ref 70–99)
Potassium: 4 mmol/L (ref 3.5–5.1)
Sodium: 137 mmol/L (ref 135–145)
Total Bilirubin: 0.4 mg/dL (ref 0.3–1.2)
Total Protein: 6.1 g/dL — ABNORMAL LOW (ref 6.5–8.1)

## 2018-08-23 LAB — VITAMIN D 25 HYDROXY (VIT D DEFICIENCY, FRACTURES): Vit D, 25-Hydroxy: 43 ng/mL (ref 30.0–100.0)

## 2018-08-23 LAB — MAGNESIUM: Magnesium: 2.2 mg/dL (ref 1.7–2.4)

## 2018-08-23 MED ORDER — AMLODIPINE BESYLATE 10 MG PO TABS: 5.0000 mg | ORAL_TABLET | Freq: Every day | ORAL | Status: DC

## 2018-08-23 MED ORDER — ENSURE ENLIVE PO LIQD: 237.0000 mL | Freq: Two times a day (BID) | ORAL | Status: DC

## 2018-08-23 MED ORDER — CEFDINIR 300 MG PO CAPS: 300.0000 mg | ORAL_CAPSULE | Freq: Two times a day (BID) | ORAL | 0 refills | Status: AC

## 2018-08-23 MED ORDER — NICOTINE 21 MG/24HR TD PT24: 21.0000 mg | MEDICATED_PATCH | Freq: Every day | TRANSDERMAL | 0 refills | Status: DC | PRN

## 2018-08-23 MED ORDER — DIAZEPAM 10 MG PO TABS: 5.0000 mg | ORAL_TABLET | Freq: Two times a day (BID) | ORAL | Status: DC | PRN

## 2018-08-23 NOTE — Plan of Care (Signed)
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
## 2018-08-23 NOTE — Plan of Care (Signed)
  Problem: Education: Goal: Knowledge of General Education information will improve Description Including pain rating scale, medication(s)/side effects and non-pharmacologic comfort measures 08/23/2018 1517 by Rance Muir, RN Outcome: Adequate for Discharge 08/23/2018 1015 by Rance Muir, RN Outcome: Progressing   Problem: Health Behavior/Discharge Planning: Goal: Ability to manage health-related needs will improve 08/23/2018 1517 by Rance Muir, RN Outcome: Adequate for Discharge 08/23/2018 1015 by Rance Muir, RN Outcome: Progressing   Problem: Clinical Measurements: Goal: Ability to maintain clinical measurements within normal limits will improve 08/23/2018 1517 by Rance Muir, RN Outcome: Adequate for Discharge 08/23/2018 1015 by Rance Muir, RN Outcome: Progressing Goal: Will remain free from infection 08/23/2018 1517 by Rance Muir, RN Outcome: Adequate for Discharge 08/23/2018 1015 by Rance Muir, RN Outcome: Progressing Goal: Diagnostic test results will improve 08/23/2018 1517 by Rance Muir, RN Outcome: Adequate for Discharge 08/23/2018 1015 by Rance Muir, RN Outcome: Progressing Goal: Respiratory complications will improve 08/23/2018 1517 by Rance Muir, RN Outcome: Adequate for Discharge 08/23/2018 1015 by Rance Muir, RN Outcome: Progressing Goal: Cardiovascular complication will be avoided 08/23/2018 1517 by Rance Muir, RN Outcome: Adequate for Discharge 08/23/2018 1015 by Rance Muir, RN Outcome: Progressing   Problem: Activity: Goal: Risk for activity intolerance will decrease 08/23/2018 1517 by Rance Muir, RN Outcome: Adequate for Discharge 08/23/2018 1015 by Rance Muir, RN Outcome: Progressing   Problem: Nutrition: Goal: Adequate nutrition will be maintained 08/23/2018 1517 by Rance Muir, RN Outcome: Adequate for Discharge 08/23/2018 1015 by Rance Muir, RN Outcome: Progressing   Problem: Coping: Goal: Level of anxiety will decrease 08/23/2018 1517 by Rance Muir, RN Outcome: Adequate for Discharge 08/23/2018  1015 by Rance Muir, RN Outcome: Progressing   Problem: Elimination: Goal: Will not experience complications related to bowel motility 08/23/2018 1517 by Rance Muir, RN Outcome: Adequate for Discharge 08/23/2018 1015 by Rance Muir, RN Outcome: Progressing Goal: Will not experience complications related to urinary retention 08/23/2018 1517 by Rance Muir, RN Outcome: Adequate for Discharge 08/23/2018 1015 by Rance Muir, RN Outcome: Progressing   Problem: Pain Managment: Goal: General experience of comfort will improve 08/23/2018 1517 by Rance Muir, RN Outcome: Adequate for Discharge 08/23/2018 1015 by Rance Muir, RN Outcome: Progressing   Problem: Safety: Goal: Ability to remain free from injury will improve 08/23/2018 1517 by Rance Muir, RN Outcome: Adequate for Discharge 08/23/2018 1015 by Rance Muir, RN Outcome: Progressing   Problem: Skin Integrity: Goal: Risk for impaired skin integrity will decrease 08/23/2018 1517 by Rance Muir, RN Outcome: Adequate for Discharge 08/23/2018 1015 by Rance Muir, RN Outcome: Progressing

## 2018-08-23 NOTE — Care Management (Signed)
PT recommends no HH and cane for mobility.  Pt has no preference of DME providers. Vaughan Basta, [facility] rep, given DME referral and will deliver to pt room prior to DC.

## 2018-08-24 ENCOUNTER — Telehealth: Payer: Self-pay | Admitting: *Deleted

## 2018-08-24 LAB — GLUCOSE, CAPILLARY: Glucose-Capillary: 27 mg/dL — CL (ref 70–99)

## 2018-08-24 NOTE — Telephone Encounter (Signed)
[id number]

## 2018-08-24 NOTE — Telephone Encounter (Signed)
-----   Message from Chipper Herb, MD sent at 08/24/2018  3:55 PM EST ----- Make sure that patient has stopped all medicines for diabetes.  I believe this was done in the hospital but please confirm that with this hospital report.

## 2018-08-28 ENCOUNTER — Telehealth: Payer: Self-pay | Admitting: Family Medicine

## 2018-08-28 NOTE — Telephone Encounter (Signed)
Patient hast stopped medication

## 2018-08-29 ENCOUNTER — Telehealth: Payer: Self-pay | Admitting: Family Medicine

## 2018-08-29 MED ORDER — BENZONATATE 100 MG PO CAPS: 100.0000 mg | ORAL_CAPSULE | Freq: Two times a day (BID) | ORAL | 0 refills | Status: DC | PRN

## 2018-08-29 NOTE — Telephone Encounter (Signed)
Pt states he is getting better but is wanting to know if Dr Laurance Flatten will refill his benzonatate (TESSALON) 100 MG capsule states it helps a lot with his cough   [facility]

## 2018-08-29 NOTE — Telephone Encounter (Signed)
Pt aware.

## 2018-09-06 DIAGNOSIS — Z79891 Long term (current) use of opiate analgesic: Secondary | ICD-10-CM | POA: Diagnosis not present

## 2018-09-09 ENCOUNTER — Other Ambulatory Visit: Payer: Self-pay | Admitting: Family Medicine

## 2018-09-09 ENCOUNTER — Other Ambulatory Visit: Payer: Self-pay | Admitting: Nurse Practitioner

## 2018-09-09 DIAGNOSIS — G459 Transient cerebral ischemic attack, unspecified: Secondary | ICD-10-CM

## 2018-09-09 DIAGNOSIS — I4891 Unspecified atrial fibrillation: Secondary | ICD-10-CM

## 2018-09-11 DIAGNOSIS — D485 Neoplasm of uncertain behavior of skin: Secondary | ICD-10-CM | POA: Diagnosis not present

## 2018-09-11 DIAGNOSIS — D044 Carcinoma in situ of skin of scalp and neck: Secondary | ICD-10-CM | POA: Diagnosis not present

## 2018-09-11 DIAGNOSIS — C4442 Squamous cell carcinoma of skin of scalp and neck: Secondary | ICD-10-CM | POA: Diagnosis not present

## 2018-09-11 DIAGNOSIS — L570 Actinic keratosis: Secondary | ICD-10-CM | POA: Diagnosis not present

## 2018-09-11 DIAGNOSIS — Z85828 Personal history of other malignant neoplasm of skin: Secondary | ICD-10-CM | POA: Diagnosis not present

## 2018-09-13 DIAGNOSIS — C4442 Squamous cell carcinoma of skin of scalp and neck: Secondary | ICD-10-CM | POA: Diagnosis not present

## 2018-10-08 ENCOUNTER — Other Ambulatory Visit: Payer: Self-pay | Admitting: Family Medicine

## 2018-10-08 ENCOUNTER — Other Ambulatory Visit: Payer: Self-pay | Admitting: Cardiology

## 2018-11-02 ENCOUNTER — Other Ambulatory Visit: Payer: Self-pay | Admitting: *Deleted

## 2018-11-02 MED ORDER — ATORVASTATIN CALCIUM 20 MG PO TABS: 20.0000 mg | ORAL_TABLET | Freq: Every day | ORAL | 0 refills | Status: DC

## 2018-11-05 ENCOUNTER — Other Ambulatory Visit: Payer: Self-pay | Admitting: Family Medicine

## 2018-11-07 ENCOUNTER — Other Ambulatory Visit: Payer: Self-pay | Admitting: *Deleted

## 2018-11-07 MED ORDER — QUINAPRIL HCL 20 MG PO TABS: 20.0000 mg | ORAL_TABLET | Freq: Every day | ORAL | 0 refills | Status: DC

## 2018-11-22 ENCOUNTER — Other Ambulatory Visit: Payer: Self-pay | Admitting: *Deleted

## 2018-11-22 MED ORDER — AMLODIPINE BESYLATE 10 MG PO TABS: 5.0000 mg | ORAL_TABLET | Freq: Every day | ORAL | 3 refills | Status: DC

## 2018-11-24 ENCOUNTER — Other Ambulatory Visit: Payer: Self-pay

## 2018-11-24 MED ORDER — FUROSEMIDE 20 MG PO TABS: 20.0000 mg | ORAL_TABLET | Freq: Every day | ORAL | 3 refills | Status: DC

## 2018-11-24 NOTE — Telephone Encounter (Signed)
Refilled lasix per fax request from [facility]

## 2018-12-03 ENCOUNTER — Other Ambulatory Visit: Payer: Self-pay | Admitting: Family Medicine

## 2018-12-04 ENCOUNTER — Other Ambulatory Visit: Payer: Self-pay | Admitting: Family Medicine

## 2018-12-04 ENCOUNTER — Other Ambulatory Visit: Payer: Self-pay | Admitting: Nurse Practitioner

## 2018-12-04 DIAGNOSIS — G459 Transient cerebral ischemic attack, unspecified: Secondary | ICD-10-CM

## 2018-12-04 DIAGNOSIS — I4891 Unspecified atrial fibrillation: Secondary | ICD-10-CM

## 2018-12-05 ENCOUNTER — Other Ambulatory Visit: Payer: Self-pay | Admitting: *Deleted

## 2018-12-15 ENCOUNTER — Encounter: Payer: Self-pay | Admitting: Family Medicine

## 2018-12-15 ENCOUNTER — Other Ambulatory Visit: Payer: Self-pay

## 2018-12-15 ENCOUNTER — Ambulatory Visit (INDEPENDENT_AMBULATORY_CARE_PROVIDER_SITE_OTHER): Payer: Medicare Other | Admitting: Family Medicine

## 2018-12-15 DIAGNOSIS — I700 Atherosclerosis of aorta: Secondary | ICD-10-CM

## 2018-12-15 DIAGNOSIS — I739 Peripheral vascular disease, unspecified: Secondary | ICD-10-CM

## 2018-12-15 DIAGNOSIS — K219 Gastro-esophageal reflux disease without esophagitis: Secondary | ICD-10-CM

## 2018-12-15 DIAGNOSIS — E782 Mixed hyperlipidemia: Secondary | ICD-10-CM | POA: Diagnosis not present

## 2018-12-15 DIAGNOSIS — I2510 Atherosclerotic heart disease of native coronary artery without angina pectoris: Secondary | ICD-10-CM | POA: Diagnosis not present

## 2018-12-15 DIAGNOSIS — G894 Chronic pain syndrome: Secondary | ICD-10-CM

## 2018-12-15 DIAGNOSIS — I25119 Atherosclerotic heart disease of native coronary artery with unspecified angina pectoris: Secondary | ICD-10-CM

## 2018-12-15 DIAGNOSIS — I70209 Unspecified atherosclerosis of native arteries of extremities, unspecified extremity: Secondary | ICD-10-CM

## 2018-12-15 DIAGNOSIS — E1140 Type 2 diabetes mellitus with diabetic neuropathy, unspecified: Secondary | ICD-10-CM

## 2018-12-15 DIAGNOSIS — R1310 Dysphagia, unspecified: Secondary | ICD-10-CM

## 2018-12-15 DIAGNOSIS — E1151 Type 2 diabetes mellitus with diabetic peripheral angiopathy without gangrene: Secondary | ICD-10-CM

## 2018-12-15 DIAGNOSIS — L84 Corns and callosities: Secondary | ICD-10-CM

## 2018-12-15 DIAGNOSIS — Z0000 Encounter for general adult medical examination without abnormal findings: Secondary | ICD-10-CM

## 2018-12-15 DIAGNOSIS — E559 Vitamin D deficiency, unspecified: Secondary | ICD-10-CM

## 2018-12-15 DIAGNOSIS — I480 Paroxysmal atrial fibrillation: Secondary | ICD-10-CM

## 2018-12-15 DIAGNOSIS — I10 Essential (primary) hypertension: Secondary | ICD-10-CM
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
## 2018-12-15 NOTE — Patient Instructions (Addendum)
Continue follow-up with pain management and Dr. Herma Mering Patient also needs an appointment with 1 of the foot specialist in [facility] orthopedics because of the callus issue and this is been a problem for 2 or 3 months as 1 of his initial visits was canceled.  He will call back and reschedule this visit. Continue to follow-up with urology as planned Continue to follow-up with cardiology and Dr. Domenic Polite as planned.  This is somewhat more urgent as he is having some chest discomfort and shortness of breath especially some PND at nighttime.  I urged him to call and set up this appointment with Dr. Domenic Polite sooner.  He is also not taking therapeutic dosing of Eliquis at this time. Continue to be careful and not put self at risk for falling Continue to practice good hand and respiratory hygiene We will arrange for the patient to see a gastroenterologist because of worsening issues with swallowing even swallowing water and feeling like he is getting choked.

## 2018-12-15 NOTE — Addendum Note (Signed)
Addended by: Zannie Cove on: 12/15/2018 01:30 PM   Modules accepted: Orders

## 2018-12-30 ENCOUNTER — Other Ambulatory Visit: Payer: Self-pay | Admitting: Family Medicine

## 2018-12-30 DIAGNOSIS — I4891 Unspecified atrial fibrillation: Secondary | ICD-10-CM

## 2018-12-30 DIAGNOSIS — G459 Transient cerebral ischemic attack, unspecified: Secondary | ICD-10-CM

## 2019-01-03 DIAGNOSIS — Z79899 Other long term (current) drug therapy: Secondary | ICD-10-CM | POA: Diagnosis not present

## 2019-01-04 ENCOUNTER — Other Ambulatory Visit: Payer: Self-pay | Admitting: Cardiology

## 2019-01-05 ENCOUNTER — Other Ambulatory Visit: Payer: Self-pay | Admitting: *Deleted

## 2019-01-05 MED ORDER — ATORVASTATIN CALCIUM 20 MG PO TABS: 20.0000 mg | ORAL_TABLET | Freq: Every day | ORAL | 1 refills | Status: DC

## 2019-01-09 DIAGNOSIS — L570 Actinic keratosis: Secondary | ICD-10-CM | POA: Diagnosis not present

## 2019-01-09 DIAGNOSIS — D485 Neoplasm of uncertain behavior of skin: Secondary | ICD-10-CM | POA: Diagnosis not present

## 2019-01-09 DIAGNOSIS — C44629 Squamous cell carcinoma of skin of left upper limb, including shoulder: Secondary | ICD-10-CM | POA: Diagnosis not present

## 2019-01-09 DIAGNOSIS — D044 Carcinoma in situ of skin of scalp and neck: Secondary | ICD-10-CM | POA: Diagnosis not present

## 2019-01-25 DIAGNOSIS — C44629 Squamous cell carcinoma of skin of left upper limb, including shoulder: Secondary | ICD-10-CM | POA: Diagnosis not present

## 2019-01-25 DIAGNOSIS — C4442 Squamous cell carcinoma of skin of scalp and neck: Secondary | ICD-10-CM | POA: Diagnosis not present

## 2019-02-07 ENCOUNTER — Other Ambulatory Visit: Payer: Self-pay | Admitting: Family Medicine

## 2019-02-22 DIAGNOSIS — B070 Plantar wart: Secondary | ICD-10-CM | POA: Diagnosis not present

## 2019-02-23 ENCOUNTER — Other Ambulatory Visit: Payer: Self-pay | Admitting: *Deleted

## 2019-02-23 MED ORDER — ATORVASTATIN CALCIUM 20 MG PO TABS: 20.0000 mg | ORAL_TABLET | Freq: Every day | ORAL | 0 refills | Status: DC

## 2019-03-06 ENCOUNTER — Other Ambulatory Visit: Payer: Self-pay | Admitting: Family Medicine

## 2019-03-08 ENCOUNTER — Other Ambulatory Visit: Payer: Self-pay | Admitting: Cardiology

## 2019-03-08 ENCOUNTER — Other Ambulatory Visit: Payer: Self-pay | Admitting: *Deleted

## 2019-03-08 MED ORDER — ATORVASTATIN CALCIUM 20 MG PO TABS: 20.0000 mg | ORAL_TABLET | Freq: Every day | ORAL | 0 refills | Status: DC

## 2019-03-30 ENCOUNTER — Other Ambulatory Visit: Payer: Self-pay | Admitting: Family Medicine

## 2019-03-30 DIAGNOSIS — G459 Transient cerebral ischemic attack, unspecified: Secondary | ICD-10-CM

## 2019-03-30 DIAGNOSIS — I4891 Unspecified atrial fibrillation: Secondary | ICD-10-CM

## 2019-04-26 DIAGNOSIS — M545 Low back pain: Secondary | ICD-10-CM | POA: Diagnosis not present

## 2019-04-26 DIAGNOSIS — Z79891 Long term (current) use of opiate analgesic: Secondary | ICD-10-CM | POA: Diagnosis not present

## 2019-04-26 DIAGNOSIS — G894 Chronic pain syndrome: Secondary | ICD-10-CM | POA: Diagnosis not present

## 2019-04-26 DIAGNOSIS — K5903 Drug induced constipation: Secondary | ICD-10-CM | POA: Diagnosis not present

## 2019-05-14 DIAGNOSIS — L570 Actinic keratosis: Secondary | ICD-10-CM | POA: Diagnosis not present

## 2019-05-14 DIAGNOSIS — D485 Neoplasm of uncertain behavior of skin: Secondary | ICD-10-CM | POA: Diagnosis not present

## 2019-05-24 DIAGNOSIS — C4442 Squamous cell carcinoma of skin of scalp and neck: Secondary | ICD-10-CM | POA: Diagnosis not present

## 2019-06-06 ENCOUNTER — Other Ambulatory Visit: Payer: Self-pay | Admitting: Nurse Practitioner

## 2019-06-21 ENCOUNTER — Telehealth: Payer: Self-pay | Admitting: Family Medicine

## 2019-06-21 NOTE — Telephone Encounter (Signed)
Please let patient know his letter to excuse him from jury duty is up front.

## 2019-06-22 NOTE — Telephone Encounter (Signed)
Pt aware.

## 2019-06-28 ENCOUNTER — Other Ambulatory Visit: Payer: Self-pay
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
## 2019-06-29 ENCOUNTER — Ambulatory Visit: Payer: Medicare Other | Admitting: Family Medicine

## 2019-07-04 ENCOUNTER — Other Ambulatory Visit: Payer: Self-pay

## 2019-07-05 ENCOUNTER — Encounter: Payer: Self-pay | Admitting: Family Medicine

## 2019-07-05 ENCOUNTER — Ambulatory Visit (INDEPENDENT_AMBULATORY_CARE_PROVIDER_SITE_OTHER): Payer: Medicare Other | Admitting: Family Medicine

## 2019-07-05 VITALS — BP 115/62 | HR 61 | Temp 97.6°F | Ht 71.0 in | Wt 190.0 lb

## 2019-07-05 DIAGNOSIS — D649 Anemia, unspecified: Secondary | ICD-10-CM

## 2019-07-05 DIAGNOSIS — I10 Essential (primary) hypertension: Secondary | ICD-10-CM

## 2019-07-05 DIAGNOSIS — K5909 Other constipation: Secondary | ICD-10-CM

## 2019-07-05 DIAGNOSIS — I700 Atherosclerosis of aorta: Secondary | ICD-10-CM

## 2019-07-05 DIAGNOSIS — E1151 Type 2 diabetes mellitus with diabetic peripheral angiopathy without gangrene: Secondary | ICD-10-CM | POA: Diagnosis not present

## 2019-07-05 DIAGNOSIS — E782 Mixed hyperlipidemia: Secondary | ICD-10-CM

## 2019-07-05 DIAGNOSIS — M5030 Other cervical disc degeneration, unspecified cervical region: Secondary | ICD-10-CM

## 2019-07-05 DIAGNOSIS — Z7901 Long term (current) use of anticoagulants: Secondary | ICD-10-CM | POA: Diagnosis not present

## 2019-07-05 DIAGNOSIS — K219 Gastro-esophageal reflux disease without esophagitis: Secondary | ICD-10-CM

## 2019-07-05 DIAGNOSIS — G4730 Sleep apnea, unspecified: Secondary | ICD-10-CM

## 2019-07-05 DIAGNOSIS — E559 Vitamin D deficiency, unspecified: Secondary | ICD-10-CM

## 2019-07-05 DIAGNOSIS — I480 Paroxysmal atrial fibrillation: Secondary | ICD-10-CM | POA: Diagnosis not present

## 2019-07-05 DIAGNOSIS — M5136 Other intervertebral disc degeneration, lumbar region: Secondary | ICD-10-CM

## 2019-07-05 DIAGNOSIS — Z23 Encounter for immunization: Secondary | ICD-10-CM

## 2019-07-05 DIAGNOSIS — I25119 Atherosclerotic heart disease of native coronary artery with unspecified angina pectoris: Secondary | ICD-10-CM

## 2019-07-05 LAB — BAYER DCA HB A1C WAIVED: HB A1C (BAYER DCA - WAIVED): 5.8 % (ref <7.0)

## 2019-07-05 MED ORDER — FUROSEMIDE 20 MG PO TABS: 20.0000 mg | ORAL_TABLET | Freq: Every day | ORAL | 1 refills | Status: DC

## 2019-07-05 MED ORDER — ISOSORBIDE MONONITRATE ER 30 MG PO TB24: 30.0000 mg | ORAL_TABLET | Freq: Every day | ORAL | 1 refills | Status: DC

## 2019-07-05 MED ORDER — SHINGRIX 50 MCG/0.5ML IM SUSR: 0.5000 mL | Freq: Once | INTRAMUSCULAR | 0 refills | Status: AC

## 2019-07-05 MED ORDER — FENOFIBRATE 160 MG PO TABS: 160.0000 mg | ORAL_TABLET | Freq: Every day | ORAL | 1 refills | Status: DC

## 2019-07-05 MED ORDER — LACTULOSE 10 GM/15ML PO SOLN: 10.0000 g | Freq: Every day | ORAL | 2 refills | Status: DC | PRN

## 2019-07-05 MED ORDER — AMLODIPINE BESYLATE 5 MG PO TABS: 5.0000 mg | ORAL_TABLET | Freq: Every day | ORAL | 1 refills | Status: DC

## 2019-07-05 MED ORDER — APIXABAN 5 MG PO TABS: 5.0000 mg | ORAL_TABLET | Freq: Two times a day (BID) | ORAL | 1 refills | Status: DC

## 2019-07-05 MED ORDER — ALBUTEROL SULFATE HFA 108 (90 BASE) MCG/ACT IN AERS: 2.0000 | INHALATION_SPRAY | Freq: Four times a day (QID) | RESPIRATORY_TRACT | 2 refills | Status: DC | PRN

## 2019-07-05 MED ORDER — PANTOPRAZOLE SODIUM 40 MG PO TBEC: 40.0000 mg | DELAYED_RELEASE_TABLET | Freq: Two times a day (BID) | ORAL | 1 refills | Status: DC

## 2019-07-05 MED ORDER — POTASSIUM CHLORIDE CRYS ER 10 MEQ PO TBCR: 10.0000 meq | EXTENDED_RELEASE_TABLET | Freq: Every day | ORAL | 1 refills | Status: DC

## 2019-07-05 MED ORDER — ATORVASTATIN CALCIUM 20 MG PO TABS: 20.0000 mg | ORAL_TABLET | Freq: Every day | ORAL | 1 refills | Status: DC

## 2019-07-05 MED ORDER — METOPROLOL TARTRATE 25 MG PO TABS: 25.0000 mg | ORAL_TABLET | Freq: Two times a day (BID) | ORAL | 1 refills | Status: DC
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
## 2019-07-06 ENCOUNTER — Other Ambulatory Visit: Payer: Self-pay | Admitting: Family Medicine

## 2019-07-06 LAB — CMP14+EGFR
ALT: 14 IU/L (ref 0–44)
AST: 21 IU/L (ref 0–40)
Albumin/Globulin Ratio: 1.5 (ref 1.2–2.2)
Albumin: 3.4 g/dL — ABNORMAL LOW (ref 3.7–4.7)
Alkaline Phosphatase: 54 IU/L (ref 39–117)
BUN/Creatinine Ratio: 17 (ref 10–24)
BUN: 16 mg/dL (ref 8–27)
Bilirubin Total: 0.5 mg/dL (ref 0.0–1.2)
CO2: 22 mmol/L (ref 20–29)
Calcium: 7.8 mg/dL — ABNORMAL LOW (ref 8.6–10.2)
Chloride: 106 mmol/L (ref 96–106)
Creatinine, Ser: 0.92 mg/dL (ref 0.76–1.27)
GFR calc Af Amer: 93 mL/min/{1.73_m2} (ref >59)
GFR calc non Af Amer: 81 mL/min/{1.73_m2} (ref >59)
Globulin, Total: 2.3 g/dL (ref 1.5–4.5)
Glucose: 127 mg/dL — ABNORMAL HIGH (ref 65–99)
Potassium: 3.8 mmol/L (ref 3.5–5.2)
Sodium: 142 mmol/L (ref 134–144)
Total Protein: 5.7 g/dL — ABNORMAL LOW (ref 6.0–8.5)

## 2019-07-06 LAB — CBC WITH DIFFERENTIAL/PLATELET
Basophils Absolute: 0.1 10*3/uL (ref 0.0–0.2)
Basos: 0 %
EOS (ABSOLUTE): 0.6 10*3/uL — ABNORMAL HIGH (ref 0.0–0.4)
Eos: 4 %
Hematocrit: 36.1 % — ABNORMAL LOW (ref 37.5–51.0)
Hemoglobin: 12.2 g/dL — ABNORMAL LOW (ref 13.0–17.7)
Immature Grans (Abs): 0 10*3/uL (ref 0.0–0.1)
Immature Granulocytes: 0 %
Lymphocytes Absolute: 4.8 10*3/uL — ABNORMAL HIGH (ref 0.7–3.1)
Lymphs: 35 %
MCH: 30.4 pg (ref 26.6–33.0)
MCHC: 33.8 g/dL (ref 31.5–35.7)
MCV: 90 fL (ref 79–97)
Monocytes Absolute: 0.8 10*3/uL (ref 0.1–0.9)
Monocytes: 6 %
Neutrophils Absolute: 7.3 10*3/uL — ABNORMAL HIGH (ref 1.4–7.0)
Neutrophils: 55 %
Platelets: 394 10*3/uL (ref 150–450)
RBC: 4.01 x10E6/uL — ABNORMAL LOW (ref 4.14–5.80)
RDW: 12.5 % (ref 11.6–15.4)
WBC: 13.5 10*3/uL — ABNORMAL HIGH (ref 3.4–10.8)

## 2019-07-06 LAB — MICROALBUMIN / CREATININE URINE RATIO
Creatinine, Urine: 115.3 mg/dL
Microalb/Creat Ratio: 194 mg/g creat — ABNORMAL HIGH (ref 0–29)
Microalbumin, Urine: 223.2 ug/mL

## 2019-07-06 LAB — LIPID PANEL
Chol/HDL Ratio: 3.5 ratio (ref 0.0–5.0)
Cholesterol, Total: 78 mg/dL — ABNORMAL LOW (ref 100–199)
HDL: 22 mg/dL — ABNORMAL LOW (ref >39)
LDL Chol Calc (NIH): 34 mg/dL (ref 0–99)
Triglycerides: 121 mg/dL (ref 0–149)
VLDL Cholesterol Cal: 22 mg/dL (ref 5–40)

## 2019-07-11 ENCOUNTER — Other Ambulatory Visit: Payer: Self-pay | Admitting: Family Medicine

## 2019-07-11 DIAGNOSIS — E782 Mixed hyperlipidemia: Secondary | ICD-10-CM

## 2019-07-11 MED ORDER — ATORVASTATIN CALCIUM 10 MG PO TABS: 10.0000 mg | ORAL_TABLET | Freq: Every day | ORAL | 1 refills | Status: DC

## 2019-08-29 DIAGNOSIS — G894 Chronic pain syndrome: Secondary | ICD-10-CM | POA: Diagnosis not present

## 2019-10-02 ENCOUNTER — Other Ambulatory Visit: Payer: Self-pay | Admitting: Family Medicine

## 2019-10-04 ENCOUNTER — Encounter: Payer: Self-pay | Admitting: Family Medicine

## 2019-10-04 ENCOUNTER — Other Ambulatory Visit: Payer: Self-pay

## 2019-10-04 ENCOUNTER — Ambulatory Visit: Payer: Medicare Other | Admitting: Family Medicine

## 2019-10-04 VITALS — BP 146/66 | HR 63 | Temp 97.3°F | Ht 71.0 in | Wt 187.4 lb

## 2019-10-04 DIAGNOSIS — D649 Anemia, unspecified: Secondary | ICD-10-CM

## 2019-10-04 DIAGNOSIS — D72829 Elevated white blood cell count, unspecified: Secondary | ICD-10-CM | POA: Diagnosis not present

## 2019-10-04 DIAGNOSIS — R252 Cramp and spasm: Secondary | ICD-10-CM

## 2019-10-04 DIAGNOSIS — I10 Essential (primary) hypertension: Secondary | ICD-10-CM

## 2019-10-04 DIAGNOSIS — I25119 Atherosclerotic heart disease of native coronary artery with unspecified angina pectoris: Secondary | ICD-10-CM | POA: Diagnosis not present
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
## 2019-10-05 ENCOUNTER — Other Ambulatory Visit: Payer: Self-pay | Admitting: Family Medicine

## 2019-10-05 LAB — CBC WITH DIFFERENTIAL/PLATELET
Basophils Absolute: 0.2 10*3/uL (ref 0.0–0.2)
Basos: 1 %
EOS (ABSOLUTE): 0.9 10*3/uL — ABNORMAL HIGH (ref 0.0–0.4)
Eos: 6 %
Hematocrit: 36.6 % — ABNORMAL LOW (ref 37.5–51.0)
Hemoglobin: 12.4 g/dL — ABNORMAL LOW (ref 13.0–17.7)
Immature Grans (Abs): 0 10*3/uL (ref 0.0–0.1)
Immature Granulocytes: 0 %
Lymphocytes Absolute: 5.4 10*3/uL — ABNORMAL HIGH (ref 0.7–3.1)
Lymphs: 32 %
MCH: 31.6 pg (ref 26.6–33.0)
MCHC: 33.9 g/dL (ref 31.5–35.7)
MCV: 93 fL (ref 79–97)
Monocytes Absolute: 1.2 10*3/uL — ABNORMAL HIGH (ref 0.1–0.9)
Monocytes: 7 %
Neutrophils Absolute: 8.9 10*3/uL — ABNORMAL HIGH (ref 1.4–7.0)
Neutrophils: 54 %
Platelets: 357 10*3/uL (ref 150–450)
RBC: 3.92 x10E6/uL — ABNORMAL LOW (ref 4.14–5.80)
RDW: 12.8 % (ref 11.6–15.4)
WBC: 16.7 10*3/uL — ABNORMAL HIGH (ref 3.4–10.8)

## 2019-10-05 LAB — LIPID PANEL
Chol/HDL Ratio: 4.3 ratio (ref 0.0–5.0)
Cholesterol, Total: 120 mg/dL (ref 100–199)
HDL: 28 mg/dL — ABNORMAL LOW (ref >39)
LDL Chol Calc (NIH): 62 mg/dL (ref 0–99)
Triglycerides: 174 mg/dL — ABNORMAL HIGH (ref 0–149)
VLDL Cholesterol Cal: 30 mg/dL (ref 5–40)

## 2019-10-05 LAB — CMP14+EGFR
ALT: 18 IU/L (ref 0–44)
AST: 23 IU/L (ref 0–40)
Albumin/Globulin Ratio: 1.4 (ref 1.2–2.2)
Albumin: 3.6 g/dL — ABNORMAL LOW (ref 3.7–4.7)
Alkaline Phosphatase: 63 IU/L (ref 39–117)
BUN/Creatinine Ratio: 16 (ref 10–24)
BUN: 13 mg/dL (ref 8–27)
Bilirubin Total: 0.7 mg/dL (ref 0.0–1.2)
CO2: 26 mmol/L (ref 20–29)
Calcium: 8 mg/dL — ABNORMAL LOW (ref 8.6–10.2)
Chloride: 103 mmol/L (ref 96–106)
Creatinine, Ser: 0.8 mg/dL (ref 0.76–1.27)
GFR calc Af Amer: 100 mL/min/{1.73_m2} (ref >59)
GFR calc non Af Amer: 86 mL/min/{1.73_m2} (ref >59)
Globulin, Total: 2.5 g/dL (ref 1.5–4.5)
Glucose: 127 mg/dL — ABNORMAL HIGH (ref 65–99)
Potassium: 3.6 mmol/L (ref 3.5–5.2)
Sodium: 143 mmol/L (ref 134–144)
Total Protein: 6.1 g/dL (ref 6.0–8.5)

## 2019-10-05 LAB — MAGNESIUM: Magnesium: 0.7 mg/dL — CL (ref 1.6–2.3)

## 2019-10-05 LAB — TSH: TSH: 0.486 u[IU]/mL (ref 0.450–4.500)

## 2019-10-07 ENCOUNTER — Encounter: Payer: Self-pay | Admitting: Family Medicine

## 2019-10-15 DIAGNOSIS — D0439 Carcinoma in situ of skin of other parts of face: Secondary | ICD-10-CM | POA: Diagnosis not present

## 2019-10-15 DIAGNOSIS — C44329 Squamous cell carcinoma of skin of other parts of face: Secondary | ICD-10-CM | POA: Diagnosis not present

## 2019-10-15 DIAGNOSIS — L570 Actinic keratosis: Secondary | ICD-10-CM | POA: Diagnosis not present

## 2019-10-17 ENCOUNTER — Ambulatory Visit (INDEPENDENT_AMBULATORY_CARE_PROVIDER_SITE_OTHER): Payer: Medicare Other | Admitting: *Deleted

## 2019-10-17 DIAGNOSIS — Z0000 Encounter for general adult medical examination without abnormal findings: Secondary | ICD-10-CM

## 2019-10-17 NOTE — Patient Instructions (Signed)
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
## 2019-11-14 ENCOUNTER — Ambulatory Visit (INDEPENDENT_AMBULATORY_CARE_PROVIDER_SITE_OTHER): Payer: Medicare Other | Admitting: Family Medicine

## 2019-11-14 ENCOUNTER — Encounter: Payer: Self-pay | Admitting: Family Medicine

## 2019-11-14 DIAGNOSIS — F419 Anxiety disorder, unspecified: Secondary | ICD-10-CM | POA: Diagnosis not present

## 2019-11-14 DIAGNOSIS — I10 Essential (primary) hypertension: Secondary | ICD-10-CM | POA: Diagnosis not present

## 2019-11-14 DIAGNOSIS — F17200 Nicotine dependence, unspecified, uncomplicated: Secondary | ICD-10-CM | POA: Diagnosis not present

## 2019-11-14 DIAGNOSIS — Z8679 Personal history of other diseases of the circulatory system: Secondary | ICD-10-CM

## 2019-11-14 DIAGNOSIS — R0602 Shortness of breath: Secondary | ICD-10-CM

## 2019-11-14 MED ORDER — SPIRIVA HANDIHALER 18 MCG IN CAPS: 18.0000 ug | ORAL_CAPSULE | Freq: Every day | RESPIRATORY_TRACT | 2 refills | Status: DC

## 2019-11-14 MED ORDER — FUROSEMIDE 20 MG PO TABS: 10.0000 mg | ORAL_TABLET | Freq: Every day | ORAL | 1 refills | Status: DC | PRN

## 2019-11-14 MED ORDER — POTASSIUM CHLORIDE CRYS ER 10 MEQ PO TBCR: 10.0000 meq | EXTENDED_RELEASE_TABLET | Freq: Every day | ORAL | 1 refills | Status: DC | PRN
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
## 2019-11-15 ENCOUNTER — Other Ambulatory Visit: Payer: Self-pay | Admitting: Family Medicine

## 2019-11-19 DIAGNOSIS — Z8679 Personal history of other diseases of the circulatory system: Secondary | ICD-10-CM | POA: Insufficient documentation

## 2019-11-19 DIAGNOSIS — R0602 Shortness of breath: Secondary | ICD-10-CM | POA: Insufficient documentation

## 2019-11-21 DIAGNOSIS — W57XXXA Bitten or stung by nonvenomous insect and other nonvenomous arthropods, initial encounter: Secondary | ICD-10-CM | POA: Diagnosis not present

## 2019-11-21 DIAGNOSIS — L299 Pruritus, unspecified: Secondary | ICD-10-CM | POA: Diagnosis not present

## 2019-11-21 DIAGNOSIS — R21 Rash and other nonspecific skin eruption: Secondary | ICD-10-CM | POA: Diagnosis not present

## 2019-12-05 ENCOUNTER — Other Ambulatory Visit: Payer: Self-pay | Admitting: Family Medicine

## 2019-12-05 DIAGNOSIS — I10 Essential (primary) hypertension: Secondary | ICD-10-CM

## 2019-12-11 ENCOUNTER — Other Ambulatory Visit: Payer: Self-pay | Admitting: Family Medicine

## 2019-12-26 DIAGNOSIS — G894 Chronic pain syndrome: Secondary | ICD-10-CM | POA: Diagnosis not present

## 2019-12-26 DIAGNOSIS — Z79899 Other long term (current) drug therapy: Secondary | ICD-10-CM | POA: Diagnosis not present

## 2019-12-26 DIAGNOSIS — M545 Low back pain: Secondary | ICD-10-CM | POA: Diagnosis not present

## 2019-12-26 DIAGNOSIS — Z79891 Long term (current) use of opiate analgesic: Secondary | ICD-10-CM | POA: Diagnosis not present

## 2020-01-02 ENCOUNTER — Other Ambulatory Visit: Payer: Self-pay | Admitting: *Deleted

## 2020-01-02 DIAGNOSIS — E782 Mixed hyperlipidemia: Secondary | ICD-10-CM

## 2020-01-02 DIAGNOSIS — I480 Paroxysmal atrial fibrillation: Secondary | ICD-10-CM

## 2020-01-02 DIAGNOSIS — I10 Essential (primary) hypertension: Secondary | ICD-10-CM

## 2020-01-02 MED ORDER — METOPROLOL TARTRATE 25 MG PO TABS: 25.0000 mg | ORAL_TABLET | Freq: Two times a day (BID) | ORAL | 0 refills | Status: DC

## 2020-01-02 MED ORDER — ATORVASTATIN CALCIUM 10 MG PO TABS: 10.0000 mg | ORAL_TABLET | Freq: Every day | ORAL | 0 refills | Status: DC

## 2020-01-02 MED ORDER — APIXABAN 5 MG PO TABS: 5.0000 mg | ORAL_TABLET | Freq: Two times a day (BID) | ORAL | 0 refills | Status: DC

## 2020-01-04 ENCOUNTER — Ambulatory Visit: Payer: Medicare Other | Admitting: Family Medicine

## 2020-01-17 DIAGNOSIS — Z029 Encounter for administrative examinations, unspecified: Secondary | ICD-10-CM

## 2020-01-27 ENCOUNTER — Other Ambulatory Visit: Payer: Self-pay | Admitting: Family Medicine

## 2020-01-27 DIAGNOSIS — E782 Mixed hyperlipidemia: Secondary | ICD-10-CM

## 2020-01-29 ENCOUNTER — Ambulatory Visit: Payer: Medicare Other | Admitting: Family Medicine

## 2020-01-30 ENCOUNTER — Encounter: Payer: Self-pay | Admitting: Family Medicine

## 2020-01-30 ENCOUNTER — Ambulatory Visit (INDEPENDENT_AMBULATORY_CARE_PROVIDER_SITE_OTHER): Payer: Medicare Other | Admitting: Family Medicine

## 2020-01-30 ENCOUNTER — Other Ambulatory Visit: Payer: Self-pay

## 2020-01-30 VITALS — BP 148/65 | HR 52 | Temp 96.7°F | Ht 71.0 in | Wt 184.6 lb

## 2020-01-30 DIAGNOSIS — R7303 Prediabetes: Secondary | ICD-10-CM

## 2020-01-30 DIAGNOSIS — E559 Vitamin D deficiency, unspecified: Secondary | ICD-10-CM | POA: Diagnosis not present

## 2020-01-30 DIAGNOSIS — I480 Paroxysmal atrial fibrillation: Secondary | ICD-10-CM

## 2020-01-30 DIAGNOSIS — R0602 Shortness of breath: Secondary | ICD-10-CM | POA: Diagnosis not present

## 2020-01-30 DIAGNOSIS — I10 Essential (primary) hypertension: Secondary | ICD-10-CM

## 2020-01-30 DIAGNOSIS — E782 Mixed hyperlipidemia: Secondary | ICD-10-CM | POA: Diagnosis not present

## 2020-01-30 DIAGNOSIS — Z23 Encounter for immunization: Secondary | ICD-10-CM

## 2020-01-30 LAB — BAYER DCA HB A1C WAIVED: HB A1C (BAYER DCA - WAIVED): 5.9 % (ref <7.0)

## 2020-01-30 MED ORDER — ATORVASTATIN CALCIUM 10 MG PO TABS: 10.0000 mg | ORAL_TABLET | Freq: Every day | ORAL | 1 refills | Status: DC

## 2020-01-30 MED ORDER — AMLODIPINE BESYLATE 5 MG PO TABS: 5.0000 mg | ORAL_TABLET | Freq: Every day | ORAL | 1 refills | Status: DC

## 2020-01-30 MED ORDER — ISOSORBIDE MONONITRATE ER 30 MG PO TB24: 30.0000 mg | ORAL_TABLET | Freq: Every day | ORAL | 1 refills | Status: DC

## 2020-01-30 MED ORDER — SPIRIVA HANDIHALER 18 MCG IN CAPS: 18.0000 ug | ORAL_CAPSULE | Freq: Every day | RESPIRATORY_TRACT | 2 refills | Status: DC

## 2020-01-30 MED ORDER — APIXABAN 5 MG PO TABS: 5.0000 mg | ORAL_TABLET | Freq: Two times a day (BID) | ORAL | 1 refills | Status: DC

## 2020-01-30 MED ORDER — METOPROLOL TARTRATE 25 MG PO TABS: 25.0000 mg | ORAL_TABLET | Freq: Two times a day (BID) | ORAL | 1 refills | Status: DC
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
## 2020-01-31 LAB — SPECIMEN STATUS

## 2020-02-01 LAB — CMP14+EGFR
ALT: 13 IU/L (ref 0–44)
AST: 20 IU/L (ref 0–40)
Albumin/Globulin Ratio: 1.8 (ref 1.2–2.2)
Albumin: 3.9 g/dL (ref 3.7–4.7)
Alkaline Phosphatase: 62 IU/L (ref 48–121)
BUN/Creatinine Ratio: 15 (ref 10–24)
BUN: 13 mg/dL (ref 8–27)
Bilirubin Total: 0.3 mg/dL (ref 0.0–1.2)
CO2: 26 mmol/L (ref 20–29)
Calcium: 9.1 mg/dL (ref 8.6–10.2)
Chloride: 105 mmol/L (ref 96–106)
Creatinine, Ser: 0.89 mg/dL (ref 0.76–1.27)
GFR calc Af Amer: 95 mL/min/{1.73_m2} (ref >59)
GFR calc non Af Amer: 82 mL/min/{1.73_m2} (ref >59)
Globulin, Total: 2.2 g/dL (ref 1.5–4.5)
Glucose: 115 mg/dL — ABNORMAL HIGH (ref 65–99)
Potassium: 4.8 mmol/L (ref 3.5–5.2)
Sodium: 142 mmol/L (ref 134–144)
Total Protein: 6.1 g/dL (ref 6.0–8.5)

## 2020-02-01 LAB — MAGNESIUM: Magnesium: 1.6 mg/dL (ref 1.6–2.3)

## 2020-02-01 LAB — VITAMIN D 25 HYDROXY (VIT D DEFICIENCY, FRACTURES): Vit D, 25-Hydroxy: 27.3 ng/mL — ABNORMAL LOW (ref 30.0–100.0)

## 2020-02-05 ENCOUNTER — Encounter: Payer: Self-pay | Admitting: Family Medicine

## 2020-02-13 DIAGNOSIS — L570 Actinic keratosis: Secondary | ICD-10-CM | POA: Diagnosis not present

## 2020-02-13 DIAGNOSIS — D485 Neoplasm of uncertain behavior of skin: Secondary | ICD-10-CM | POA: Diagnosis not present

## 2020-02-13 DIAGNOSIS — D044 Carcinoma in situ of skin of scalp and neck: Secondary | ICD-10-CM | POA: Diagnosis not present

## 2020-02-21 DIAGNOSIS — C4442 Squamous cell carcinoma of skin of scalp and neck: Secondary | ICD-10-CM | POA: Diagnosis not present

## 2020-03-19 ENCOUNTER — Encounter: Payer: Self-pay | Admitting: *Deleted

## 2020-04-11 DIAGNOSIS — Z79891 Long term (current) use of opiate analgesic: Secondary | ICD-10-CM | POA: Diagnosis not present

## 2020-04-11 DIAGNOSIS — Z79899 Other long term (current) drug therapy: Secondary | ICD-10-CM | POA: Diagnosis not present

## 2020-04-11 DIAGNOSIS — G894 Chronic pain syndrome: Secondary | ICD-10-CM | POA: Diagnosis not present

## 2020-04-14 ENCOUNTER — Other Ambulatory Visit: Payer: Self-pay | Admitting: Family Medicine

## 2020-04-14 DIAGNOSIS — K219 Gastro-esophageal reflux disease without esophagitis: Secondary | ICD-10-CM

## 2020-04-28 ENCOUNTER — Other Ambulatory Visit: Payer: Self-pay | Admitting: Family Medicine

## 2020-05-02 ENCOUNTER — Ambulatory Visit: Payer: Medicare Other | Admitting: Family Medicine

## 2020-05-14 ENCOUNTER — Ambulatory Visit: Payer: Medicare Other | Admitting: Family Medicine

## 2020-05-15 ENCOUNTER — Other Ambulatory Visit: Payer: Self-pay

## 2020-05-15 ENCOUNTER — Encounter: Payer: Self-pay | Admitting: Family Medicine

## 2020-05-15 ENCOUNTER — Ambulatory Visit (INDEPENDENT_AMBULATORY_CARE_PROVIDER_SITE_OTHER): Payer: Medicare Other | Admitting: Family Medicine

## 2020-05-15 VITALS — BP 136/64 | HR 61 | Temp 98.0°F | Ht 71.0 in | Wt 185.5 lb

## 2020-05-15 DIAGNOSIS — Z23 Encounter for immunization: Secondary | ICD-10-CM | POA: Diagnosis not present

## 2020-05-15 DIAGNOSIS — E782 Mixed hyperlipidemia: Secondary | ICD-10-CM | POA: Diagnosis not present

## 2020-05-15 DIAGNOSIS — R300 Dysuria: Secondary | ICD-10-CM | POA: Diagnosis not present

## 2020-05-15 DIAGNOSIS — R7303 Prediabetes: Secondary | ICD-10-CM

## 2020-05-15 DIAGNOSIS — I10 Essential (primary) hypertension: Secondary | ICD-10-CM

## 2020-05-15 DIAGNOSIS — N3001 Acute cystitis with hematuria: Secondary | ICD-10-CM

## 2020-05-15 LAB — URINALYSIS, COMPLETE
Bilirubin, UA: NEGATIVE
Glucose, UA: NEGATIVE
Ketones, UA: NEGATIVE
Nitrite, UA: POSITIVE — AB
Specific Gravity, UA: 1.025 (ref 1.005–1.030)
Urobilinogen, Ur: 0.2 mg/dL (ref 0.2–1.0)
pH, UA: 5 (ref 5.0–7.5)

## 2020-05-15 LAB — MICROSCOPIC EXAMINATION: WBC, UA: >30 /hpf — AB (ref 0–5)

## 2020-05-15 LAB — BAYER DCA HB A1C WAIVED: HB A1C (BAYER DCA - WAIVED): 6 % (ref <7.0)

## 2020-05-15 MED ORDER — SULFAMETHOXAZOLE-TRIMETHOPRIM 800-160 MG PO TABS: 1.0000 | ORAL_TABLET | Freq: Two times a day (BID) | ORAL | 0 refills | Status: DC

## 2020-05-15 NOTE — Progress Notes (Signed)
Patient ID: Dustin Dominguez, male    DOB: 1940-12-01, 79 y.o.   MRN: [id number]  Chief Complaint:  Medical Management of Chronic Issues   HPI: Dustin Dominguez is a 79 y.o. male presenting on 05/15/2020 for Medical Management of Chronic Issues  1. HTN Complaint with meds - amlodipine 5 mg, lopressor  Checking BP at home: occasionally checks, reports that it is usually good Exercising Regularly - no due to back pain Watching Salt intake - tries to limit but does eat a lot of salt Pertinent ROS:  Headache - No Chest pain - No Dyspnea - no change from his baseline Palpitations - No LE edema - No  They report good compliance with medications. No medication side effects. He was previously seeing a cardiologist Dr. Domenic Polite. He plans to call his office to schedule an appointment.   2. Prediabetes Dustin Dominguez has a history of prediabetes. He is not currently taking medications for this. He does occasionally check his BP at home and reports that it is usually good. He has an eye exam scheduled for December.   3. Hyperlipidemia Taking fenofibrate 160 mg and Lipitor 10 mg daily. Patient tolerates medication well and denies side effects.   4. Dysuria Dustin Dominguez reports dysuria with some bleeding in his urine about 10 days ago. This last for about 6 days. He has not seen any blood in his urine for the last 4-5 days. He has had recurrent UTIs. He usually does best with Bactrim for UTIs. He has not seen a urologist before. He prefers not too now as the is trying to social distance as much as possible.   PMH: Smoking status noted  Review of Systems Per HPI.      BP 136/64   Pulse 61   Temp 98 F (36.7 C) (Temporal)   Ht $R'5\' 11"'CY$  (1.803 m)   Wt 185 lb 8 oz (84.1 kg)   BMI 25.87 kg/m   Gen: NAD, alert, cooperative with exam HEENT: NCAT, EOMI, PERRL CV: RRR, good S1/S2, no murmur Resp: CTABL, no wheezes, non-labored Abd: SNTND, BS present, no guarding or organomegaly Back: No CVA  tenderness Ext: No edema, warm Neuro: Alert and oriented, No gross deficits   Urine dipstick shows positive for RBC's, positive for protein, positive for nitrates and positive for leukocytes.  Micro exam: >30 WBC's per HPF, 0-2 RBC's per HPF and many bacteria.  Assessment and Plan: Dustin Dominguez was seen today for medical management of chronic issues.  Diagnoses and all orders for this visit:  Essential hypertension, benign Well controlled on current regimen. Lab work pending. Patient will let me know to request a referral if he is unable to make an appointment with his cardiologist.  -     CBC with Differential/Platelet -     CMP14+EGFR  Prediabetes A1C is 6.0 today. Continue lifestyle changes.  -     Bayer DCA Hb A1c Waived  Mixed hyperlipidemia Last lipid panel was well controlled.  -     Lipid panel  Dysuria -     Urinalysis, Complete -     Urine Culture  Acute cystitis with hematuria Culture pending. Rx for Bactrim. Return to office for new or worsening symptoms, or if symptoms persist.  -     sulfamethoxazole-trimethoprim (BACTRIM DS) 800-160 MG tablet; Take 1 tablet by mouth 2 (two) times daily for 7 days.  Flu vaccine today in office.   Follow up in 3 months with PCP for chronic illnesses.  The above assessment and management plan was discussed with the patient. The patient verbalized understanding of and has agreed to the management plan. Patient is aware to call the clinic if symptoms persist or worsen. Patient is aware when to return to the clinic for a follow-up visit. Patient educated on when it is appropriate to go to the emergency department.   Marjorie Smolder, FNP-C [facility] [phone]

## 2020-05-15 NOTE — Patient Instructions (Signed)

## 2020-05-16 LAB — CBC WITH DIFFERENTIAL/PLATELET
Basophils Absolute: 0.2 10*3/uL (ref 0.0–0.2)
Basos: 1 %
EOS (ABSOLUTE): 0.7 10*3/uL — ABNORMAL HIGH (ref 0.0–0.4)
Eos: 6 %
Hematocrit: 41.4 % (ref 37.5–51.0)
Hemoglobin: 13.3 g/dL (ref 13.0–17.7)
Immature Grans (Abs): 0 10*3/uL (ref 0.0–0.1)
Immature Granulocytes: 0 %
Lymphocytes Absolute: 4.1 10*3/uL — ABNORMAL HIGH (ref 0.7–3.1)
Lymphs: 36 %
MCH: 29.5 pg (ref 26.6–33.0)
MCHC: 32.1 g/dL (ref 31.5–35.7)
MCV: 92 fL (ref 79–97)
Monocytes Absolute: 1 10*3/uL — ABNORMAL HIGH (ref 0.1–0.9)
Monocytes: 9 %
Neutrophils Absolute: 5.5 10*3/uL (ref 1.4–7.0)
Neutrophils: 48 %
Platelets: 345 10*3/uL (ref 150–450)
RBC: 4.51 x10E6/uL (ref 4.14–5.80)
RDW: 12.9 % (ref 11.6–15.4)
WBC: 11.4 10*3/uL — ABNORMAL HIGH (ref 3.4–10.8)

## 2020-05-16 LAB — CMP14+EGFR
ALT: 12 IU/L (ref 0–44)
AST: 17 IU/L (ref 0–40)
Albumin/Globulin Ratio: 1.5 (ref 1.2–2.2)
Albumin: 3.6 g/dL — ABNORMAL LOW (ref 3.7–4.7)
Alkaline Phosphatase: 66 IU/L (ref 44–121)
BUN/Creatinine Ratio: 16 (ref 10–24)
BUN: 14 mg/dL (ref 8–27)
Bilirubin Total: 0.3 mg/dL (ref 0.0–1.2)
CO2: 25 mmol/L (ref 20–29)
Calcium: 8.7 mg/dL (ref 8.6–10.2)
Chloride: 110 mmol/L — ABNORMAL HIGH (ref 96–106)
Creatinine, Ser: 0.9 mg/dL (ref 0.76–1.27)
GFR calc Af Amer: 95 mL/min/{1.73_m2} (ref >59)
GFR calc non Af Amer: 82 mL/min/{1.73_m2} (ref >59)
Globulin, Total: 2.4 g/dL (ref 1.5–4.5)
Glucose: 96 mg/dL (ref 65–99)
Potassium: 4.5 mmol/L (ref 3.5–5.2)
Sodium: 146 mmol/L — ABNORMAL HIGH (ref 134–144)
Total Protein: 6 g/dL (ref 6.0–8.5)

## 2020-05-16 LAB — LIPID PANEL
Chol/HDL Ratio: 4.4 ratio (ref 0.0–5.0)
Cholesterol, Total: 110 mg/dL (ref 100–199)
HDL: 25 mg/dL — ABNORMAL LOW (ref >39)
LDL Chol Calc (NIH): 60 mg/dL (ref 0–99)
Triglycerides: 142 mg/dL (ref 0–149)
VLDL Cholesterol Cal: 25 mg/dL (ref 5–40)

## 2020-05-19 ENCOUNTER — Other Ambulatory Visit: Payer: Self-pay | Admitting: Family Medicine

## 2020-05-19 DIAGNOSIS — N3001 Acute cystitis with hematuria: Secondary | ICD-10-CM

## 2020-05-19 LAB — URINE CULTURE

## 2020-05-19 MED ORDER — DOXYCYCLINE HYCLATE 100 MG PO TABS: 100.0000 mg | ORAL_TABLET | Freq: Two times a day (BID) | ORAL | 0 refills | Status: AC

## 2020-06-16 DIAGNOSIS — N390 Urinary tract infection, site not specified: Secondary | ICD-10-CM | POA: Diagnosis not present

## 2020-07-09 ENCOUNTER — Other Ambulatory Visit: Payer: Self-pay | Admitting: Family Medicine

## 2020-07-09 DIAGNOSIS — H524 Presbyopia: Secondary | ICD-10-CM | POA: Diagnosis not present

## 2020-07-09 DIAGNOSIS — E119 Type 2 diabetes mellitus without complications: Secondary | ICD-10-CM | POA: Diagnosis not present

## 2020-07-09 DIAGNOSIS — E782 Mixed hyperlipidemia: Secondary | ICD-10-CM

## 2020-07-09 DIAGNOSIS — Z961 Presence of intraocular lens: Secondary | ICD-10-CM | POA: Diagnosis not present

## 2020-07-22 DIAGNOSIS — L570 Actinic keratosis: Secondary | ICD-10-CM | POA: Diagnosis not present

## 2020-07-22 DIAGNOSIS — D485 Neoplasm of uncertain behavior of skin: Secondary | ICD-10-CM | POA: Diagnosis not present

## 2020-07-22 DIAGNOSIS — C44222 Squamous cell carcinoma of skin of right ear and external auricular canal: Secondary | ICD-10-CM | POA: Diagnosis not present

## 2020-07-22 DIAGNOSIS — D0439 Carcinoma in situ of skin of other parts of face: Secondary | ICD-10-CM | POA: Diagnosis not present

## 2020-07-22 DIAGNOSIS — C44629 Squamous cell carcinoma of skin of left upper limb, including shoulder: Secondary | ICD-10-CM | POA: Diagnosis not present

## 2020-07-31 DIAGNOSIS — C44222 Squamous cell carcinoma of skin of right ear and external auricular canal: Secondary | ICD-10-CM | POA: Diagnosis not present

## 2020-07-31 DIAGNOSIS — C44629 Squamous cell carcinoma of skin of left upper limb, including shoulder: Secondary | ICD-10-CM | POA: Diagnosis not present

## 2020-08-05 ENCOUNTER — Other Ambulatory Visit: Payer: Self-pay | Admitting: Family Medicine

## 2020-08-05 DIAGNOSIS — E782 Mixed hyperlipidemia: Secondary | ICD-10-CM

## 2020-08-05 DIAGNOSIS — I480 Paroxysmal atrial fibrillation: Secondary | ICD-10-CM

## 2020-08-05 DIAGNOSIS — I10 Essential (primary) hypertension: Secondary | ICD-10-CM

## 2020-08-07 DIAGNOSIS — N390 Urinary tract infection, site not specified: Secondary | ICD-10-CM | POA: Diagnosis not present

## 2020-08-07 DIAGNOSIS — K029 Dental caries, unspecified: Secondary | ICD-10-CM | POA: Diagnosis not present

## 2020-08-07 DIAGNOSIS — G894 Chronic pain syndrome: Secondary | ICD-10-CM | POA: Diagnosis not present

## 2020-08-07 DIAGNOSIS — Z79891 Long term (current) use of opiate analgesic: Secondary | ICD-10-CM | POA: Diagnosis not present

## 2020-08-08 ENCOUNTER — Telehealth: Payer: Self-pay

## 2020-08-08 NOTE — Telephone Encounter (Signed)
Left message on machine for patient to set up a past due check - per notes from [place]  - patient last seen by Dr Domenic Polite in 2019 - notes @ [facility]

## 2020-08-18 DIAGNOSIS — N401 Enlarged prostate with lower urinary tract symptoms: Secondary | ICD-10-CM | POA: Diagnosis not present

## 2020-08-18 DIAGNOSIS — N452 Orchitis: Secondary | ICD-10-CM | POA: Diagnosis not present

## 2020-08-18 DIAGNOSIS — N50811 Right testicular pain: Secondary | ICD-10-CM | POA: Diagnosis not present

## 2020-08-18 DIAGNOSIS — R3912 Poor urinary stream: Secondary | ICD-10-CM | POA: Diagnosis not present

## 2020-08-25 DIAGNOSIS — N50811 Right testicular pain: Secondary | ICD-10-CM | POA: Diagnosis not present

## 2020-09-01 DIAGNOSIS — N452 Orchitis: Secondary | ICD-10-CM | POA: Diagnosis not present

## 2020-09-15 DIAGNOSIS — N50811 Right testicular pain: Secondary | ICD-10-CM | POA: Diagnosis not present

## 2020-09-27 ENCOUNTER — Other Ambulatory Visit: Payer: Self-pay | Admitting: Family Medicine

## 2020-10-06 ENCOUNTER — Other Ambulatory Visit: Payer: Self-pay | Admitting: Family Medicine

## 2020-10-06 DIAGNOSIS — K219 Gastro-esophageal reflux disease without esophagitis: Secondary | ICD-10-CM

## 2020-10-06 DIAGNOSIS — Z8679 Personal history of other diseases of the circulatory system: Secondary | ICD-10-CM

## 2020-10-21 ENCOUNTER — Other Ambulatory Visit: Payer: Self-pay

## 2020-10-21 ENCOUNTER — Ambulatory Visit: Payer: Medicare Other | Admitting: Cardiology

## 2020-10-21 ENCOUNTER — Encounter: Payer: Self-pay | Admitting: Cardiology

## 2020-10-21 VITALS — BP 112/60 | HR 67 | Ht 71.0 in | Wt 203.0 lb

## 2020-10-21 DIAGNOSIS — I480 Paroxysmal atrial fibrillation: Secondary | ICD-10-CM

## 2020-10-21 DIAGNOSIS — D6869 Other thrombophilia: Secondary | ICD-10-CM | POA: Diagnosis not present

## 2020-10-21 DIAGNOSIS — E782 Mixed hyperlipidemia: Secondary | ICD-10-CM | POA: Diagnosis not present

## 2020-10-21 DIAGNOSIS — I25119 Atherosclerotic heart disease of native coronary artery with unspecified angina pectoris: Secondary | ICD-10-CM

## 2020-10-21 NOTE — Patient Instructions (Signed)
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
## 2020-11-04 ENCOUNTER — Other Ambulatory Visit: Payer: Self-pay | Admitting: Family Medicine

## 2020-11-04 DIAGNOSIS — Z8679 Personal history of other diseases of the circulatory system: Secondary | ICD-10-CM

## 2020-11-04 DIAGNOSIS — I10 Essential (primary) hypertension: Secondary | ICD-10-CM

## 2020-11-04 NOTE — Telephone Encounter (Signed)
Not on patient's current med list Was listed in office visit note of 01/30/20 but then not in the 05/15/20 office visit notes, but it was refilled on 10/21/20??

## 2020-11-14 ENCOUNTER — Other Ambulatory Visit: Payer: Self-pay | Admitting: Family Medicine

## 2020-11-14 DIAGNOSIS — K219 Gastro-esophageal reflux disease without esophagitis: Secondary | ICD-10-CM

## 2020-12-04 ENCOUNTER — Other Ambulatory Visit: Payer: Self-pay | Admitting: Family Medicine

## 2020-12-04 DIAGNOSIS — I10 Essential (primary) hypertension: Secondary | ICD-10-CM

## 2020-12-11 ENCOUNTER — Other Ambulatory Visit: Payer: Self-pay | Admitting: Family Medicine

## 2020-12-11 DIAGNOSIS — I10 Essential (primary) hypertension: Secondary | ICD-10-CM

## 2020-12-16 ENCOUNTER — Other Ambulatory Visit: Payer: Self-pay

## 2020-12-16 DIAGNOSIS — K219 Gastro-esophageal reflux disease without esophagitis: Secondary | ICD-10-CM

## 2020-12-31 ENCOUNTER — Other Ambulatory Visit: Payer: Self-pay | Admitting: Family Medicine

## 2021-01-03 ENCOUNTER — Other Ambulatory Visit: Payer: Self-pay | Admitting: Family Medicine

## 2021-01-03 DIAGNOSIS — I10 Essential (primary) hypertension: Secondary | ICD-10-CM

## 2021-01-04 ENCOUNTER — Other Ambulatory Visit: Payer: Self-pay | Admitting: Family Medicine

## 2021-01-04 DIAGNOSIS — E782 Mixed hyperlipidemia: Secondary | ICD-10-CM

## 2021-01-05 NOTE — Telephone Encounter (Signed)
Appointment scheduled.

## 2021-01-05 NOTE — Telephone Encounter (Signed)
Dustin Dominguez NTBS 30 days given 12/11/20

## 2021-01-06 ENCOUNTER — Other Ambulatory Visit: Payer: Self-pay

## 2021-01-06 ENCOUNTER — Ambulatory Visit (INDEPENDENT_AMBULATORY_CARE_PROVIDER_SITE_OTHER): Payer: Medicare HMO | Admitting: Family Medicine

## 2021-01-06 ENCOUNTER — Encounter: Payer: Self-pay | Admitting: Family Medicine

## 2021-01-06 VITALS — BP 135/64 | HR 58 | Temp 97.7°F | Ht 71.0 in | Wt 183.8 lb

## 2021-01-06 DIAGNOSIS — I10 Essential (primary) hypertension: Secondary | ICD-10-CM | POA: Diagnosis not present

## 2021-01-06 DIAGNOSIS — I700 Atherosclerosis of aorta: Secondary | ICD-10-CM

## 2021-01-06 DIAGNOSIS — K219 Gastro-esophageal reflux disease without esophagitis: Secondary | ICD-10-CM

## 2021-01-06 DIAGNOSIS — E559 Vitamin D deficiency, unspecified: Secondary | ICD-10-CM

## 2021-01-06 DIAGNOSIS — Z23 Encounter for immunization: Secondary | ICD-10-CM

## 2021-01-06 DIAGNOSIS — B370 Candidal stomatitis: Secondary | ICD-10-CM

## 2021-01-06 DIAGNOSIS — E782 Mixed hyperlipidemia: Secondary | ICD-10-CM | POA: Diagnosis not present

## 2021-01-06 DIAGNOSIS — I480 Paroxysmal atrial fibrillation: Secondary | ICD-10-CM | POA: Diagnosis not present

## 2021-01-06 DIAGNOSIS — R7303 Prediabetes: Secondary | ICD-10-CM | POA: Diagnosis not present

## 2021-01-06 DIAGNOSIS — I25119 Atherosclerotic heart disease of native coronary artery with unspecified angina pectoris: Secondary | ICD-10-CM

## 2021-01-06 DIAGNOSIS — E042 Nontoxic multinodular goiter: Secondary | ICD-10-CM

## 2021-01-06 LAB — BAYER DCA HB A1C WAIVED: HB A1C (BAYER DCA - WAIVED): 5.5 % (ref <7.0)

## 2021-01-06 MED ORDER — ISOSORBIDE MONONITRATE ER 30 MG PO TB24: 30.0000 mg | ORAL_TABLET | Freq: Every day | ORAL | 1 refills | Status: DC

## 2021-01-06 MED ORDER — METOPROLOL TARTRATE 25 MG PO TABS: 25.0000 mg | ORAL_TABLET | Freq: Two times a day (BID) | ORAL | 1 refills | Status: DC

## 2021-01-06 MED ORDER — ATORVASTATIN CALCIUM 10 MG PO TABS: 10.0000 mg | ORAL_TABLET | Freq: Every day | ORAL | 1 refills | Status: DC

## 2021-01-06 MED ORDER — PANTOPRAZOLE SODIUM 40 MG PO TBEC
40.0000 mg | DELAYED_RELEASE_TABLET | Freq: Two times a day (BID) | ORAL | 1 refills | Status: DC
Start: 2021-01-06 — End: 2021-06-23

## 2021-01-06 MED ORDER — FLUCONAZOLE 100 MG PO TABS: ORAL_TABLET | ORAL | 0 refills | Status: AC

## 2021-01-06 MED ORDER — ELIQUIS 5 MG PO TABS: 1.0000 | ORAL_TABLET | Freq: Two times a day (BID) | ORAL | 1 refills | Status: DC

## 2021-01-06 MED ORDER — AMLODIPINE BESYLATE 5 MG PO TABS: 1.0000 | ORAL_TABLET | Freq: Every day | ORAL | 1 refills | Status: DC
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
## 2021-01-07 ENCOUNTER — Ambulatory Visit (INDEPENDENT_AMBULATORY_CARE_PROVIDER_SITE_OTHER): Payer: Medicare HMO

## 2021-01-07 DIAGNOSIS — Z0000 Encounter for general adult medical examination without abnormal findings: Secondary | ICD-10-CM

## 2021-01-07 LAB — CMP14+EGFR
ALT: 10 IU/L (ref 0–44)
AST: 18 IU/L (ref 0–40)
Albumin/Globulin Ratio: 1.4 (ref 1.2–2.2)
Albumin: 3.7 g/dL (ref 3.7–4.7)
Alkaline Phosphatase: 49 IU/L (ref 44–121)
BUN/Creatinine Ratio: 18 (ref 10–24)
BUN: 16 mg/dL (ref 8–27)
Bilirubin Total: 0.4 mg/dL (ref 0.0–1.2)
CO2: 26 mmol/L (ref 20–29)
Calcium: 9 mg/dL (ref 8.6–10.2)
Chloride: 108 mmol/L — ABNORMAL HIGH (ref 96–106)
Creatinine, Ser: 0.89 mg/dL (ref 0.76–1.27)
Globulin, Total: 2.7 g/dL (ref 1.5–4.5)
Glucose: 112 mg/dL — ABNORMAL HIGH (ref 65–99)
Potassium: 4.9 mmol/L (ref 3.5–5.2)
Sodium: 145 mmol/L — ABNORMAL HIGH (ref 134–144)
Total Protein: 6.4 g/dL (ref 6.0–8.5)
eGFR: 88 mL/min/{1.73_m2} (ref >59)

## 2021-01-07 LAB — CBC WITH DIFFERENTIAL/PLATELET
Basophils Absolute: 0.1 10*3/uL (ref 0.0–0.2)
Basos: 1 %
EOS (ABSOLUTE): 0.8 10*3/uL — ABNORMAL HIGH (ref 0.0–0.4)
Eos: 6 %
Hematocrit: 41.6 % (ref 37.5–51.0)
Hemoglobin: 13.9 g/dL (ref 13.0–17.7)
Immature Grans (Abs): 0 10*3/uL (ref 0.0–0.1)
Immature Granulocytes: 0 %
Lymphocytes Absolute: 5.4 10*3/uL — ABNORMAL HIGH (ref 0.7–3.1)
Lymphs: 41 %
MCH: 31.2 pg (ref 26.6–33.0)
MCHC: 33.4 g/dL (ref 31.5–35.7)
MCV: 94 fL (ref 79–97)
Monocytes Absolute: 1.1 10*3/uL — ABNORMAL HIGH (ref 0.1–0.9)
Monocytes: 9 %
Neutrophils Absolute: 5.7 10*3/uL (ref 1.4–7.0)
Neutrophils: 43 %
Platelets: 358 10*3/uL (ref 150–450)
RBC: 4.45 x10E6/uL (ref 4.14–5.80)
RDW: 12 % (ref 11.6–15.4)
WBC: 13.2 10*3/uL — ABNORMAL HIGH (ref 3.4–10.8)

## 2021-01-07 LAB — LIPID PANEL
Chol/HDL Ratio: 4.7 ratio (ref 0.0–5.0)
Cholesterol, Total: 156 mg/dL (ref 100–199)
HDL: 33 mg/dL — ABNORMAL LOW (ref >39)
LDL Chol Calc (NIH): 88 mg/dL (ref 0–99)
Triglycerides: 203 mg/dL — ABNORMAL HIGH (ref 0–149)
VLDL Cholesterol Cal: 35 mg/dL (ref 5–40)
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
## 2021-01-07 NOTE — Addendum Note (Signed)
Addended by: Karle Plumber on: 01/07/2021 08:49 AM   Modules accepted: Orders

## 2021-01-07 NOTE — Patient Instructions (Signed)
  Dustin Dominguez , Thank you for taking time to come for your Medicare Wellness Visit. I appreciate your ongoing commitment to your health goals. Please review the following plan we discussed and let me know if I can assist you in the future.   These are the goals we discussed:  Goals      DIET - INCREASE WATER INTAKE     Try to drink 6-8 glasses of water daily.      Exercise 150 min/wk Moderate Activity     Prevent falls     Stay active       Quit Smoking        This is a list of the screening recommended for you and due dates:  Health Maintenance  Topic Date Due   Urine Protein Check  07/04/2020   COVID-19 Vaccine (4 - Booster for Moderna series) 01/22/2021*   Hepatitis C Screening: USPSTF Recommendation to screen - Ages 18-79 yo.  01/29/2021*   Flu Shot  02/16/2021   Tetanus Vaccine  01/09/2028   Pneumonia vaccines  Completed   Zoster (Shingles) Vaccine  Completed   HPV Vaccine  Aged Out  *Topic was postponed. The date shown is not the original due date.

## 2021-01-08 ENCOUNTER — Other Ambulatory Visit: Payer: Medicare HMO

## 2021-01-08 LAB — VITAMIN B12: Vitamin B-12: 210 pg/mL — ABNORMAL LOW (ref 232–1245)

## 2021-01-08 LAB — TSH: TSH: 1.08 u[IU]/mL (ref 0.450–4.500)

## 2021-01-08 LAB — SPECIMEN STATUS REPORT

## 2021-01-08 LAB — VITAMIN D 25 HYDROXY (VIT D DEFICIENCY, FRACTURES): Vit D, 25-Hydroxy: 39.7 ng/mL (ref 30.0–100.0)

## 2021-01-09 ENCOUNTER — Other Ambulatory Visit: Payer: Self-pay | Admitting: Family Medicine

## 2021-01-09 ENCOUNTER — Encounter: Payer: Self-pay | Admitting: Family Medicine

## 2021-01-09 DIAGNOSIS — N289 Disorder of kidney and ureter, unspecified: Secondary | ICD-10-CM

## 2021-01-09 HISTORY — DX: Disorder of kidney and ureter, unspecified: N28.9

## 2021-01-09 LAB — MICROALBUMIN / CREATININE URINE RATIO
Creatinine, Urine: 58.3 mg/dL
Microalb/Creat Ratio: 1711 mg/g{creat} — ABNORMAL HIGH (ref 0–29)
Microalbumin, Urine: 997.8 ug/mL

## 2021-01-09 MED ORDER — LISINOPRIL 2.5 MG PO TABS
2.5000 mg | ORAL_TABLET | Freq: Every day | ORAL | 2 refills | Status: DC
Start: 2021-01-09 — End: 2021-03-03

## 2021-01-21 ENCOUNTER — Telehealth: Payer: Self-pay | Admitting: Family Medicine

## 2021-01-21 NOTE — Telephone Encounter (Signed)
Spoke with patient, appointment scheduled 01/28/21 with Hendricks Limes.  Per note from urgent care, sutures placed on 01/16/21 and should be removed in 10-14 days.

## 2021-01-23 ENCOUNTER — Telehealth: Payer: Self-pay | Admitting: Family Medicine

## 2021-01-23 NOTE — Telephone Encounter (Signed)
He can skip his Eliquis the day before and the day of his procedure.

## 2021-01-23 NOTE — Telephone Encounter (Signed)
Patient has a dental extraction on Tuesday, should he discontinue Eliquis?

## 2021-01-23 NOTE — Telephone Encounter (Signed)
Pt called wanting to know if he should keep taking his Eliquis Rx because he is supposed to have 2 teeth pulled next Tuesday. Please advise and call patient.

## 2021-01-23 NOTE — Telephone Encounter (Signed)
Patient aware of instruction

## 2021-01-28 ENCOUNTER — Other Ambulatory Visit: Payer: Self-pay

## 2021-01-28 ENCOUNTER — Encounter: Payer: Self-pay | Admitting: Family Medicine

## 2021-01-28 ENCOUNTER — Ambulatory Visit (INDEPENDENT_AMBULATORY_CARE_PROVIDER_SITE_OTHER): Payer: Medicare HMO | Admitting: Family Medicine

## 2021-01-28 VITALS — BP 148/63 | HR 64 | Temp 98.1°F | Ht 71.0 in | Wt 186.0 lb

## 2021-01-28 DIAGNOSIS — Z4802 Encounter for removal of sutures: Secondary | ICD-10-CM

## 2021-01-28 DIAGNOSIS — L03116 Cellulitis of left lower limb: Secondary | ICD-10-CM | POA: Diagnosis not present

## 2021-01-28 MED ORDER — SULFAMETHOXAZOLE-TRIMETHOPRIM 800-160 MG PO TABS: 1.0000 | ORAL_TABLET | Freq: Two times a day (BID) | ORAL | 0 refills | Status: AC

## 2021-01-28 NOTE — Progress Notes (Signed)
Assessment & Plan:  1. Visit for suture removal Wound cleansed with normal saline.  3 deep sutures and 8 simple sutures removed.  Wound cleansed again with normal saline and Steri-Strips applied.  Education provided on wound care.  2. Cellulitis of left lower extremity - sulfamethoxazole-trimethoprim (BACTRIM DS) 800-160 MG tablet; Take 1 tablet by mouth 2 (two) times daily for 7 days.  Dispense: 14 tablet; Refill: 0   Follow up plan: Return in about 1 week (around 02/04/2021) for wound re-check.  Hendricks Limes, MSN, APRN, FNP-C [facility]  Subjective:   Patient ID: Dustin Dominguez, male    DOB: October 17, 1940, 81 y.o.   MRN: [id number]  HPI: Dustin Dominguez is a 80 y.o. male presenting on 01/28/2021 for Suture / Staple Removal (Left lower leg )  Patient is here for suture removal.  He had sutures placed in his left lower extremity on 01/16/2021 at urgent care.  He reports today he completed the doxycycline twice daily for 10 days.  He has been covering the wound with Vaseline.  He started having swelling around the laceration within the past couple of days.   ROS: Negative unless specifically indicated above in HPI.   Relevant past medical history reviewed and updated as indicated.   Allergies and medications reviewed and updated.   Current Outpatient Medications:    amLODipine (NORVASC) 5 MG tablet, Take 1 tablet (5 mg total) by mouth daily., Disp: 90 tablet, Rfl: 1   apixaban (ELIQUIS) 5 MG TABS tablet, Take 1 tablet (5 mg total) by mouth 2 (two) times daily., Disp: 180 tablet, Rfl: 1   atorvastatin (LIPITOR) 10 MG tablet, Take 1 tablet (10 mg total) by mouth daily., Disp: 90 tablet, Rfl: 1   feeding supplement, ENSURE ENLIVE, (ENSURE ENLIVE) LIQD, Take 237 mLs by mouth 2 (two) times daily between meals., Disp: , Rfl:    fenofibrate 160 MG tablet, TAKE 1 TABLET BY MOUTH DAILY, Disp: 90 tablet, Rfl: 0   isosorbide mononitrate (IMDUR) 30 MG 24 hr tablet, Take  1 tablet (30 mg total) by mouth daily., Disp: 90 tablet, Rfl: 1   lactulose (CONSTULOSE) 10 GM/15ML solution, TAKE ONE TABLESPOONFUL (15ML) BY MOUTH DAILY AS NEEDED FOR MILD CONSTIPATION, MODERATE CONSTIPATION OR SEVERE CONSTIPATION (NEEDS TO BE SEEN BEFORE NEXT REFILL), Disp: 240 mL, Rfl: 1   lisinopril (ZESTRIL) 2.5 MG tablet, Take 1 tablet (2.5 mg total) by mouth daily., Disp: 30 tablet, Rfl: 2   metoprolol tartrate (LOPRESSOR) 25 MG tablet, Take 1 tablet (25 mg total) by mouth 2 (two) times daily., Disp: 180 tablet, Rfl: 1   morphine (MS CONTIN) 30 MG 12 hr tablet, Take 30 mg by mouth 3 (three) times daily., Disp: , Rfl: 0   oxyCODONE-acetaminophen (PERCOCET) 10-325 MG tablet, Take 1 tablet by mouth 6 (six) times daily., Disp: , Rfl:    pantoprazole (PROTONIX) 40 MG tablet, Take 1 tablet (40 mg total) by mouth 2 (two) times daily., Disp: 180 tablet, Rfl: 1   sulfamethoxazole-trimethoprim (BACTRIM DS) 800-160 MG tablet, Take 1 tablet by mouth 2 (two) times daily for 7 days., Disp: 14 tablet, Rfl: 0   tiotropium (SPIRIVA HANDIHALER) 18 MCG inhalation capsule, Place 1 capsule (18 mcg total) into inhaler and inhale daily., Disp: 30 capsule, Rfl: 2   VENTOLIN HFA 108 (90 Base) MCG/ACT inhaler, INHALE TWO PUFFS INTO THE LUNGS EVERY 6 HOURS AS NEEDED FOR WHEEZING OR SHORTNESS OF BREATH, Disp: 18 g, Rfl: 0  Allergies  Allergen  Reactions   Amitriptyline Other (See Comments)    sleepy   Bextra [Valdecoxib] Other (See Comments)    unknown   Codeine Nausea And Vomiting   Cymbalta [Duloxetine Hcl] Swelling and Other (See Comments)    dizzy   Esomeprazole Magnesium Diarrhea   Niacin-Lovastatin Er Other (See Comments)    headache   Niaspan [Niacin Er] Other (See Comments)    Increased headache   Penicillins Rash    Has patient had a PCN reaction causing immediate rash, facial/tongue/throat swelling, SOB or lightheadedness with hypotension: Yes Has patient had a PCN reaction causing severe rash  involving mucus membranes or skin necrosis: No Has patient had a PCN reaction that required hospitalization No Has patient had a PCN reaction occurring within the last 10 years: No Tolerated Zosyn 06/2016     Objective:   BP (!) 148/63   Pulse 64   Temp 98.1 F (36.7 C) (Temporal)   Ht 5\' 11"  (1.803 m)   Wt 186 lb (84.4 kg)   BMI 25.94 kg/m    Physical Exam Vitals reviewed.  Constitutional:      General: He is not in acute distress.    Appearance: Normal appearance. He is not ill-appearing, toxic-appearing or diaphoretic.  HENT:     Head: Normocephalic and atraumatic.  Eyes:     General: No scleral icterus.       Right eye: No discharge.        Left eye: No discharge.     Conjunctiva/sclera: Conjunctivae normal.  Cardiovascular:     Rate and Rhythm: Normal rate.  Pulmonary:     Effort: Pulmonary effort is normal. No respiratory distress.  Musculoskeletal:        General: Normal range of motion.     Cervical back: Normal range of motion.  Skin:    General: Skin is warm and dry.     Findings: Laceration (LLE with surrounding erythema and warmth. No drainage. Well approximated.) present.  Neurological:     Mental Status: He is alert and oriented to person, place, and time. Mental status is at baseline.  Psychiatric:        Mood and Affect: Mood normal.        Behavior: Behavior normal.        Thought Content: Thought content normal.        Judgment: Judgment normal.

## 2021-02-02 ENCOUNTER — Other Ambulatory Visit: Payer: Self-pay

## 2021-02-02 ENCOUNTER — Emergency Department (HOSPITAL_COMMUNITY)
Admission: EM | Admit: 2021-02-02 | Discharge: 2021-02-02 | Disposition: A | Discharge status: Home / Self Care | Payer: Medicare HMO | Attending: Emergency Medicine | Admitting: Emergency Medicine

## 2021-02-02 ENCOUNTER — Encounter (HOSPITAL_COMMUNITY): Payer: Self-pay | Admitting: *Deleted

## 2021-02-02 DIAGNOSIS — I10 Essential (primary) hypertension: Secondary | ICD-10-CM | POA: Diagnosis not present

## 2021-02-02 DIAGNOSIS — Z951 Presence of aortocoronary bypass graft: Secondary | ICD-10-CM | POA: Insufficient documentation

## 2021-02-02 DIAGNOSIS — I2510 Atherosclerotic heart disease of native coronary artery without angina pectoris: Secondary | ICD-10-CM | POA: Diagnosis not present

## 2021-02-02 DIAGNOSIS — R0602 Shortness of breath: Secondary | ICD-10-CM | POA: Insufficient documentation

## 2021-02-02 DIAGNOSIS — Z4800 Encounter for change or removal of nonsurgical wound dressing: Secondary | ICD-10-CM | POA: Diagnosis not present

## 2021-02-02 DIAGNOSIS — I4891 Unspecified atrial fibrillation: Secondary | ICD-10-CM | POA: Diagnosis not present

## 2021-02-02 DIAGNOSIS — F17210 Nicotine dependence, cigarettes, uncomplicated: Secondary | ICD-10-CM | POA: Diagnosis not present

## 2021-02-02 DIAGNOSIS — Z7901 Long term (current) use of anticoagulants: Secondary | ICD-10-CM | POA: Insufficient documentation

## 2021-02-02 DIAGNOSIS — Z79899 Other long term (current) drug therapy: Secondary | ICD-10-CM | POA: Diagnosis not present

## 2021-02-02 DIAGNOSIS — R600 Localized edema: Secondary | ICD-10-CM | POA: Insufficient documentation

## 2021-02-02 DIAGNOSIS — Z5189 Encounter for other specified aftercare: Secondary | ICD-10-CM

## 2021-02-02 NOTE — Discharge Instructions (Addendum)
Please continue the antibiotics that you were given by urgent care.  Please follow-up with your regular doctor in 2 days for wound check and if you have any new or worsening symptoms in the meantime you will need to return to the emergency department immediately.

## 2021-02-02 NOTE — ED Triage Notes (Signed)
Pt states he had stitches removed from his left lower leg and for the last week he has had redness and drainage coming from the leg
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
## 2021-02-04 ENCOUNTER — Encounter: Payer: Self-pay | Admitting: Family Medicine

## 2021-02-04 ENCOUNTER — Other Ambulatory Visit: Payer: Self-pay

## 2021-02-04 ENCOUNTER — Ambulatory Visit (INDEPENDENT_AMBULATORY_CARE_PROVIDER_SITE_OTHER): Payer: Medicare HMO | Admitting: Family Medicine

## 2021-02-04 VITALS — BP 145/64 | HR 63 | Temp 97.2°F | Ht 71.0 in | Wt 189.0 lb

## 2021-02-04 DIAGNOSIS — S81812D Laceration without foreign body, left lower leg, subsequent encounter: Secondary | ICD-10-CM | POA: Diagnosis not present

## 2021-02-04 NOTE — Progress Notes (Signed)
Assessment & Plan:  1. Laceration of left lower extremity, subsequent encounter Improved. Well approximated. No s/s of infection. Steri-strips removed in office. Covered with Telfa dressing. Encouraged to keep clean, dry, and covered until completely healed.   Follow up plan: Return if symptoms worsen or fail to improve.  Hendricks Limes, MSN, APRN, FNP-C [facility]  Subjective:   Patient ID: Dustin Dominguez, male    DOB: October 19, 1940, 80 y.o.   MRN: [id number]  HPI: Dustin Dominguez is a 80 y.o. male presenting on 02/04/2021 for Wound Check (Left lower extremity )  Patient is here for a wound follow-up. At his last visit on 01/28/21 sutures were removed and he was given an antibiotic due to cellulitis. He has completed the antibiotics. Swelling has gone down.    ROS: Negative unless specifically indicated above in HPI.   Relevant past medical history reviewed and updated as indicated.   Allergies and medications reviewed and updated.   Current Outpatient Medications:    amLODipine (NORVASC) 5 MG tablet, Take 1 tablet (5 mg total) by mouth daily., Disp: 90 tablet, Rfl: 1   apixaban (ELIQUIS) 5 MG TABS tablet, Take 1 tablet (5 mg total) by mouth 2 (two) times daily., Disp: 180 tablet, Rfl: 1   atorvastatin (LIPITOR) 10 MG tablet, Take 1 tablet (10 mg total) by mouth daily., Disp: 90 tablet, Rfl: 1   clindamycin (CLEOCIN) 300 MG capsule, Take 300 mg by mouth 4 (four) times daily., Disp: , Rfl:    feeding supplement, ENSURE ENLIVE, (ENSURE ENLIVE) LIQD, Take 237 mLs by mouth 2 (two) times daily between meals., Disp: , Rfl:    fenofibrate 160 MG tablet, TAKE 1 TABLET BY MOUTH DAILY, Disp: 90 tablet, Rfl: 0   isosorbide mononitrate (IMDUR) 30 MG 24 hr tablet, Take 1 tablet (30 mg total) by mouth daily., Disp: 90 tablet, Rfl: 1   lactulose (CONSTULOSE) 10 GM/15ML solution, TAKE ONE TABLESPOONFUL (15ML) BY MOUTH DAILY AS NEEDED FOR MILD CONSTIPATION, MODERATE  CONSTIPATION OR SEVERE CONSTIPATION (NEEDS TO BE SEEN BEFORE NEXT REFILL), Disp: 240 mL, Rfl: 1   lisinopril (ZESTRIL) 2.5 MG tablet, Take 1 tablet (2.5 mg total) by mouth daily., Disp: 30 tablet, Rfl: 2   metoprolol tartrate (LOPRESSOR) 25 MG tablet, Take 1 tablet (25 mg total) by mouth 2 (two) times daily., Disp: 180 tablet, Rfl: 1   morphine (MS CONTIN) 30 MG 12 hr tablet, Take 30 mg by mouth 3 (three) times daily., Disp: , Rfl: 0   oxyCODONE-acetaminophen (PERCOCET) 10-325 MG tablet, Take 1 tablet by mouth 6 (six) times daily., Disp: , Rfl:    pantoprazole (PROTONIX) 40 MG tablet, Take 1 tablet (40 mg total) by mouth 2 (two) times daily., Disp: 180 tablet, Rfl: 1   sulfamethoxazole-trimethoprim (BACTRIM DS) 800-160 MG tablet, Take 1 tablet by mouth 2 (two) times daily for 7 days., Disp: 14 tablet, Rfl: 0   tiotropium (SPIRIVA HANDIHALER) 18 MCG inhalation capsule, Place 1 capsule (18 mcg total) into inhaler and inhale daily., Disp: 30 capsule, Rfl: 2   VENTOLIN HFA 108 (90 Base) MCG/ACT inhaler, INHALE TWO PUFFS INTO THE LUNGS EVERY 6 HOURS AS NEEDED FOR WHEEZING OR SHORTNESS OF BREATH, Disp: 18 g, Rfl: 0  Allergies  Allergen Reactions   Amitriptyline Other (See Comments)    sleepy   Bextra [Valdecoxib] Other (See Comments)    unknown   Codeine Nausea And Vomiting   Cymbalta [Duloxetine Hcl] Swelling and Other (See Comments)  dizzy   Esomeprazole Magnesium Diarrhea   Niacin-Lovastatin Er Other (See Comments)    headache   Niaspan [Niacin Er] Other (See Comments)    Increased headache   Penicillins Rash    Has patient had a PCN reaction causing immediate rash, facial/tongue/throat swelling, SOB or lightheadedness with hypotension: Yes Has patient had a PCN reaction causing severe rash involving mucus membranes or skin necrosis: No Has patient had a PCN reaction that required hospitalization No Has patient had a PCN reaction occurring within the last 10 years: No Tolerated Zosyn  06/2016     Objective:   BP (!) 145/64   Pulse 63   Temp (!) 97.2 F (36.2 C) (Temporal)   Ht 5\' 11"  (1.803 m)   Wt 189 lb (85.7 kg)   SpO2 93%   BMI 26.36 kg/m    Physical Exam Vitals reviewed.  Constitutional:      General: He is not in acute distress.    Appearance: Normal appearance. He is not ill-appearing, toxic-appearing or diaphoretic.  HENT:     Head: Normocephalic and atraumatic.  Eyes:     General: No scleral icterus.       Right eye: No discharge.        Left eye: No discharge.     Conjunctiva/sclera: Conjunctivae normal.  Cardiovascular:     Rate and Rhythm: Normal rate.  Pulmonary:     Effort: Pulmonary effort is normal. No respiratory distress.  Musculoskeletal:        General: Normal range of motion.     Cervical back: Normal range of motion.  Skin:    General: Skin is warm and dry.     Findings: Laceration (LLE well approximated without erythema, warmth, or swelling. Minimal serous drainage.) present.  Neurological:     Mental Status: He is alert and oriented to person, place, and time. Mental status is at baseline.  Psychiatric:        Mood and Affect: Mood normal.        Behavior: Behavior normal.        Thought Content: Thought content normal.        Judgment: Judgment normal.

## 2021-02-09 ENCOUNTER — Ambulatory Visit (INDEPENDENT_AMBULATORY_CARE_PROVIDER_SITE_OTHER): Payer: Medicare HMO

## 2021-02-09 DIAGNOSIS — Z0000 Encounter for general adult medical examination without abnormal findings: Secondary | ICD-10-CM

## 2021-02-09 NOTE — Patient Instructions (Signed)
  [facility] Maintenance Summary and Written Plan of Care  Mr. Dustin Dominguez ,  Thank you for allowing me to perform your Medicare Annual Wellness Visit and for your ongoing commitment to your health.   Health Maintenance & Immunization History Health Maintenance  Topic Date Due   Hepatitis C Screening  Never done   COVID-19 Vaccine (4 - Booster for Moderna series) 09/17/2020   INFLUENZA VACCINE  02/16/2021   TETANUS/TDAP  01/09/2028   PNA vac Low Risk Adult  Completed   Zoster Vaccines- Shingrix  Completed   HPV VACCINES  Aged Out   Immunization History  Administered Date(s) Administered   Fluad Quad(high Dose 65+) 05/15/2020   Influenza Inj Mdck Quad With Preservative 05/19/2018   Influenza Split 07/21/2011, 05/31/2012, 04/30/2020   Influenza Whole 04/30/2010   Influenza, High Dose Seasonal PF 04/14/2016, 03/30/2019   Influenza, Seasonal, Injecte, Preservative Fre 05/06/2009, 04/30/2010   Influenza,inj,Quad PF,6+ Mos 06/13/2013, 06/04/2014, 05/06/2015   Influenza,inj,quad, With Preservative 04/18/2017   Influenza-Unspecified 05/18/2017   Moderna SARS-COV2 Booster Vaccination 05/20/2020   Moderna Sars-Covid-2 Vaccination 09/26/2019, 10/22/2019   Pneumococcal Conjugate-13 07/19/2002, 06/13/2013   Pneumococcal Polysaccharide-23 10/18/2011, 11/01/2011   Tdap 01/07/2006, 04/21/2011, 01/08/2018   Zoster Recombinat (Shingrix) 01/30/2020, 01/07/2021    These are the patient goals that we discussed:  Goals Addressed               This Visit's Progress     Patient Stated     Exercise 150 min/wk Moderate Activity (pt-stated)   Not on track     2 recent falls. Had injury to ankle. Required sutures.      Other     Prevent falls   Not on track     Stay active           This is a list of Health Maintenance Items that are overdue or due now: Health Maintenance Due  Topic Date Due   Hepatitis C Screening  Never done   COVID-19 Vaccine (4 -  Booster for Moderna series) 09/17/2020     Orders/Referrals Placed Today: No orders of the defined types were placed in this encounter.  (Contact our referral department at [phone] if you have not spoken with someone about your referral appointment within the next 5 days)    Follow-up Plan  Scheduled 05/07/21 at 2:50pm with Hendricks Limes, FNP
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
## 2021-02-24 ENCOUNTER — Other Ambulatory Visit: Payer: Self-pay

## 2021-02-24 ENCOUNTER — Ambulatory Visit (INDEPENDENT_AMBULATORY_CARE_PROVIDER_SITE_OTHER): Payer: Medicare HMO | Admitting: Nurse Practitioner

## 2021-02-24 ENCOUNTER — Encounter: Payer: Self-pay | Admitting: Nurse Practitioner

## 2021-02-24 VITALS — BP 162/55 | HR 60 | Temp 96.9°F | Ht 71.0 in | Wt 184.0 lb

## 2021-02-24 DIAGNOSIS — M5489 Other dorsalgia: Secondary | ICD-10-CM | POA: Insufficient documentation

## 2021-02-24 MED ORDER — LIDOCAINE 5 % EX PTCH: 1.0000 | MEDICATED_PATCH | CUTANEOUS | 0 refills | Status: DC

## 2021-02-24 NOTE — Assessment & Plan Note (Signed)
Patient has had chronic back pain but this is new for patient in the last few days.  Patient reports trying to lift and twisted wrong and has had excruciating pain.  Advised patient to get an x-ray of his back which patient decides to wait until symptoms are not resolved.  Patient is currently on oxycodone 10-325 mg tablet by mouth 6 times daily, MS Contin 30 mg tablet by mouth 3 times daily.  Advised patient to take medication as prescribed, started patient on a 5% lidocaine patch.  Ice pack.  Education provided printed handouts given.  Advised patient to follow-up with worsening or unresolved symptoms, patient verbalized understanding.  Rx sent to pharmacy.
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
## 2021-02-24 NOTE — Patient Instructions (Signed)
Acute Back Pain, Adult Acute back pain is sudden and usually short-lived. It is often caused by an injury to the muscles and tissues in the back. The injury may result from: A muscle or ligament getting overstretched or torn (strained). Ligaments are tissues that connect bones to each other. Lifting something improperly can cause a back strain. Wear and tear (degeneration) of the spinal disks. Spinal disks are circular tissue that provide cushioning between the bones of the spine (vertebrae). Twisting motions, such as while playing sports or doing yard work. A hit to the back. Arthritis. You may have a physical exam, lab tests, and imaging tests to find the cause ofyour pain. Acute back pain usually goes away with rest and home care. Follow these instructions at home: Managing pain, stiffness, and swelling Treatment may include medicines for pain and inflammation that are taken by mouth or applied to the skin, prescription pain medicine, or muscle relaxants. Take over-the-counter and prescription medicines only as told by your health care provider. Your health care provider may recommend applying ice during the first 24-48 hours after your pain starts. To do this: Put ice in a plastic bag. Place a towel between your skin and the bag. Leave the ice on for 20 minutes, 2-3 times a day. If directed, apply heat to the affected area as often as told by your health care provider. Use the heat source that your health care provider recommends, such as a moist heat pack or a heating pad. Place a towel between your skin and the heat source. Leave the heat on for 20-30 minutes. Remove the heat if your skin turns bright red. This is especially important if you are unable to feel pain, heat, or cold. You have a greater risk of getting burned. Activity  Do not stay in bed. Staying in bed for more than 1-2 days can delay your recovery. Sit up and stand up straight. Avoid leaning forward when you sit or  hunching over when you stand. If you work at a desk, sit close to it so you do not need to lean over. Keep your chin tucked in. Keep your neck drawn back, and keep your elbows bent at a 90-degree angle (right angle). Sit high and close to the steering wheel when you drive. Add lower back (lumbar) support to your car seat, if needed. Take short walks on even surfaces as soon as you are able. Try to increase the length of time you walk each day. Do not sit, drive, or stand in one place for more than 30 minutes at a time. Sitting or standing for long periods of time can put stress on your back. Do not drive or use heavy machinery while taking prescription pain medicine. Use proper lifting techniques. When you bend and lift, use positions that put less stress on your back: Bend your knees. Keep the load close to your body. Avoid twisting. Exercise regularly as told by your health care provider. Exercising helps your back heal faster and helps prevent back injuries by keeping muscles strong and flexible. Work with a physical therapist to make a safe exercise program, as recommended by your health care provider. Do any exercises as told by your physical therapist.  Lifestyle Maintain a healthy weight. Extra weight puts stress on your back and makes it difficult to have good posture. Avoid activities or situations that make you feel anxious or stressed. Stress and anxiety increase muscle tension and can make back pain worse. Learn ways to manage   anxiety and stress, such as through exercise. General instructions Sleep on a firm mattress in a comfortable position. Try lying on your side with your knees slightly bent. If you lie on your back, put a pillow under your knees. Follow your treatment plan as told by your health care provider. This may include: Cognitive or behavioral therapy. Acupuncture or massage therapy. Meditation or yoga. Contact a health care provider if: You have pain that is not  relieved with rest or medicine. You have increasing pain going down into your legs or buttocks. Your pain does not improve after 2 weeks. You have pain at night. You lose weight without trying. You have a fever or chills. Get help right away if: You develop new bowel or bladder control problems. You have unusual weakness or numbness in your arms or legs. You develop nausea or vomiting. You develop abdominal pain. You feel faint. Summary Acute back pain is sudden and usually short-lived. Use proper lifting techniques. When you bend and lift, use positions that put less stress on your back. Take over-the-counter and prescription medicines and apply heat or ice as directed by your health care provider. This information is not intended to replace advice given to you by your health care provider. Make sure you discuss any questions you have with your healthcare provider. Document Revised: 03/25/2020 Document Reviewed: 03/28/2020 [employer] Patient Education  2022 [employer]  

## 2021-03-03 ENCOUNTER — Other Ambulatory Visit: Payer: Self-pay | Admitting: Family Medicine

## 2021-03-03 DIAGNOSIS — N289 Disorder of kidney and ureter, unspecified: Secondary | ICD-10-CM

## 2021-03-23 ENCOUNTER — Other Ambulatory Visit: Payer: Self-pay | Admitting: Family Medicine

## 2021-03-23 DIAGNOSIS — E782 Mixed hyperlipidemia: Secondary | ICD-10-CM

## 2021-04-30 ENCOUNTER — Other Ambulatory Visit: Payer: Self-pay | Admitting: Family Medicine

## 2021-04-30 DIAGNOSIS — E782 Mixed hyperlipidemia: Secondary | ICD-10-CM

## 2021-05-07 ENCOUNTER — Other Ambulatory Visit: Payer: Self-pay

## 2021-05-07 ENCOUNTER — Encounter: Payer: Self-pay | Admitting: Family Medicine

## 2021-05-07 ENCOUNTER — Ambulatory Visit (INDEPENDENT_AMBULATORY_CARE_PROVIDER_SITE_OTHER): Payer: Medicare HMO | Admitting: Family Medicine

## 2021-05-07 VITALS — BP 154/67 | HR 57 | Temp 97.8°F | Ht 71.0 in | Wt 186.6 lb

## 2021-05-07 DIAGNOSIS — N50811 Right testicular pain: Secondary | ICD-10-CM

## 2021-05-07 DIAGNOSIS — N289 Disorder of kidney and ureter, unspecified: Secondary | ICD-10-CM

## 2021-05-07 DIAGNOSIS — E538 Deficiency of other specified B group vitamins: Secondary | ICD-10-CM

## 2021-05-07 DIAGNOSIS — Z0000 Encounter for general adult medical examination without abnormal findings: Secondary | ICD-10-CM

## 2021-05-07 DIAGNOSIS — R7303 Prediabetes: Secondary | ICD-10-CM

## 2021-05-07 DIAGNOSIS — R0602 Shortness of breath: Secondary | ICD-10-CM

## 2021-05-07 DIAGNOSIS — R82998 Other abnormal findings in urine: Secondary | ICD-10-CM

## 2021-05-07 DIAGNOSIS — F17200 Nicotine dependence, unspecified, uncomplicated: Secondary | ICD-10-CM | POA: Diagnosis not present

## 2021-05-07 DIAGNOSIS — I10 Essential (primary) hypertension: Secondary | ICD-10-CM

## 2021-05-07 DIAGNOSIS — R300 Dysuria: Secondary | ICD-10-CM

## 2021-05-07 DIAGNOSIS — Z0001 Encounter for general adult medical examination with abnormal findings: Secondary | ICD-10-CM

## 2021-05-07 LAB — BAYER DCA HB A1C WAIVED: HB A1C (BAYER DCA - WAIVED): 5.6 % (ref 4.8–5.6)

## 2021-05-07 MED ORDER — UMECLIDINIUM-VILANTEROL 62.5-25 MCG/ACT IN AEPB: 1.0000 | INHALATION_SPRAY | Freq: Every day | RESPIRATORY_TRACT | 2 refills | Status: DC

## 2021-05-07 MED ORDER — LISINOPRIL 5 MG PO TABS: 5.0000 mg | ORAL_TABLET | Freq: Every day | ORAL | 2 refills | Status: DC
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
## 2021-05-08 ENCOUNTER — Ambulatory Visit (HOSPITAL_COMMUNITY)
Admission: RE | Admit: 2021-05-08 | Discharge: 2021-05-08 | Disposition: A | Discharge status: Home / Self Care | Payer: Medicare HMO | Source: Ambulatory Visit | Attending: Family Medicine | Admitting: Family Medicine

## 2021-05-08 DIAGNOSIS — N50811 Right testicular pain: Secondary | ICD-10-CM | POA: Insufficient documentation

## 2021-05-08 LAB — CBC WITH DIFFERENTIAL/PLATELET
Basophils Absolute: 0.1 10*3/uL (ref 0.0–0.2)
Basos: 1 %
EOS (ABSOLUTE): 0.6 10*3/uL — ABNORMAL HIGH (ref 0.0–0.4)
Eos: 5 %
Hematocrit: 38.6 % (ref 37.5–51.0)
Hemoglobin: 13.2 g/dL (ref 13.0–17.7)
Immature Grans (Abs): 0 10*3/uL (ref 0.0–0.1)
Immature Granulocytes: 0 %
Lymphocytes Absolute: 4.3 10*3/uL — ABNORMAL HIGH (ref 0.7–3.1)
Lymphs: 33 %
MCH: 31.2 pg (ref 26.6–33.0)
MCHC: 34.2 g/dL (ref 31.5–35.7)
MCV: 91 fL (ref 79–97)
Monocytes Absolute: 1.3 10*3/uL — ABNORMAL HIGH (ref 0.1–0.9)
Monocytes: 10 %
Neutrophils Absolute: 6.6 10*3/uL (ref 1.4–7.0)
Neutrophils: 51 %
Platelets: 351 10*3/uL (ref 150–450)
RBC: 4.23 x10E6/uL (ref 4.14–5.80)
RDW: 12.6 % (ref 11.6–15.4)
WBC: 13 10*3/uL — ABNORMAL HIGH (ref 3.4–10.8)

## 2021-05-08 LAB — CMP14+EGFR
ALT: 12 IU/L (ref 0–44)
AST: 19 IU/L (ref 0–40)
Albumin/Globulin Ratio: 1.4 (ref 1.2–2.2)
Albumin: 3.6 g/dL — ABNORMAL LOW (ref 3.7–4.7)
Alkaline Phosphatase: 49 IU/L (ref 44–121)
BUN/Creatinine Ratio: 21 (ref 10–24)
BUN: 16 mg/dL (ref 8–27)
Bilirubin Total: 0.4 mg/dL (ref 0.0–1.2)
CO2: 23 mmol/L (ref 20–29)
Calcium: 8.9 mg/dL (ref 8.6–10.2)
Chloride: 109 mmol/L — ABNORMAL HIGH (ref 96–106)
Creatinine, Ser: 0.77 mg/dL (ref 0.76–1.27)
Globulin, Total: 2.6 g/dL (ref 1.5–4.5)
Glucose: 126 mg/dL — ABNORMAL HIGH (ref 70–99)
Potassium: 4 mmol/L (ref 3.5–5.2)
Sodium: 143 mmol/L (ref 134–144)
Total Protein: 6.2 g/dL (ref 6.0–8.5)
eGFR: 92 mL/min/{1.73_m2} (ref >59)

## 2021-05-08 LAB — LIPID PANEL
Chol/HDL Ratio: 5.7 ratio — ABNORMAL HIGH (ref 0.0–5.0)
Cholesterol, Total: 160 mg/dL (ref 100–199)
HDL: 28 mg/dL — ABNORMAL LOW (ref >39)
LDL Chol Calc (NIH): 92 mg/dL (ref 0–99)
Triglycerides: 232 mg/dL — ABNORMAL HIGH (ref 0–149)
VLDL Cholesterol Cal: 40 mg/dL (ref 5–40)

## 2021-05-11 ENCOUNTER — Encounter: Payer: Self-pay | Admitting: Family Medicine

## 2021-05-11 NOTE — Addendum Note (Signed)
Addended by: [name] on: 05/11/2021 06:12 PM   Modules accepted: Orders

## 2021-05-12 ENCOUNTER — Other Ambulatory Visit: Payer: Self-pay

## 2021-05-12 ENCOUNTER — Other Ambulatory Visit: Payer: Medicare HMO

## 2021-05-12 DIAGNOSIS — R82998 Other abnormal findings in urine: Secondary | ICD-10-CM

## 2021-05-12 LAB — URINALYSIS, ROUTINE W REFLEX MICROSCOPIC
Bilirubin, UA: NEGATIVE
Glucose, UA: NEGATIVE
Ketones, UA: NEGATIVE
Nitrite, UA: POSITIVE — AB
Specific Gravity, UA: 1.02 (ref 1.005–1.030)
Urobilinogen, Ur: 0.2 mg/dL (ref 0.2–1.0)
pH, UA: 5.5 (ref 5.0–7.5)

## 2021-05-12 LAB — MICROSCOPIC EXAMINATION
Renal Epithel, UA: NONE SEEN /hpf
WBC, UA: >30 /hpf — AB (ref 0–5)

## 2021-05-12 NOTE — Progress Notes (Signed)
Lmtcb.

## 2021-05-12 NOTE — Progress Notes (Signed)
Add on sheet taken to lab

## 2021-05-13 ENCOUNTER — Other Ambulatory Visit: Payer: Self-pay | Admitting: Family Medicine

## 2021-05-13 DIAGNOSIS — N3001 Acute cystitis with hematuria: Secondary | ICD-10-CM

## 2021-05-13 MED ORDER — CIPROFLOXACIN HCL 500 MG PO TABS: 500.0000 mg | ORAL_TABLET | Freq: Two times a day (BID) | ORAL | 0 refills | Status: AC

## 2021-05-14 LAB — VITAMIN B12: Vitamin B-12: 431 pg/mL (ref 232–1245)

## 2021-05-14 LAB — SPECIMEN STATUS REPORT

## 2021-05-17 LAB — URINE CULTURE

## 2021-05-22 ENCOUNTER — Telehealth: Payer: Self-pay | Admitting: Family Medicine

## 2021-05-22 NOTE — Telephone Encounter (Signed)
Those blood pressures are normal. Please continue medication at prescribed dosage.

## 2021-05-22 NOTE — Telephone Encounter (Signed)
Pt called to let Britney know that ever since he started taking a higher dose of his BP medicine (Lisinopril 5mg ) his BP has been running low and needs advise on what to do.  On.. 05/19/2021 BP was 113/57 05/20/2021 BP was 119/48 05/21/2021 BP was 115/44 05/22/2021 BP was 97/42

## 2021-05-25 NOTE — Telephone Encounter (Signed)
Patient aware and verbalizes understanding. 

## 2021-06-19 ENCOUNTER — Encounter: Payer: Self-pay | Admitting: Family Medicine

## 2021-06-19 ENCOUNTER — Ambulatory Visit (INDEPENDENT_AMBULATORY_CARE_PROVIDER_SITE_OTHER): Payer: Medicare HMO | Admitting: Family Medicine

## 2021-06-19 VITALS — BP 168/64 | HR 75 | Temp 97.5°F | Ht 71.0 in | Wt 185.8 lb

## 2021-06-19 DIAGNOSIS — R0602 Shortness of breath: Secondary | ICD-10-CM | POA: Diagnosis not present

## 2021-06-19 DIAGNOSIS — I10 Essential (primary) hypertension: Secondary | ICD-10-CM

## 2021-06-19 DIAGNOSIS — Z596 Low income: Secondary | ICD-10-CM

## 2021-06-19 MED ORDER — UMECLIDINIUM-VILANTEROL 62.5-25 MCG/ACT IN AEPB
1.0000 | INHALATION_SPRAY | Freq: Every day | RESPIRATORY_TRACT | 2 refills | Status: DC
Start: 2021-06-19 — End: 2021-08-20

## 2021-06-20 ENCOUNTER — Other Ambulatory Visit: Payer: Self-pay | Admitting: Family Medicine

## 2021-06-20 DIAGNOSIS — N289 Disorder of kidney and ureter, unspecified: Secondary | ICD-10-CM

## 2021-06-22 ENCOUNTER — Encounter: Payer: Self-pay | Admitting: Family Medicine

## 2021-06-22 ENCOUNTER — Telehealth: Payer: Self-pay
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
## 2021-06-22 NOTE — Chronic Care Management (AMB) (Signed)
  Chronic Care Management   Outreach Note  06/22/2021 Name: CARLITOS BOTTINO MRN: [id number] DOB: 1940-12-13  GEROME KOKESH is a 80 y.o. year old male who is a primary care patient of [name], FNP. I reached out to [name] by phone today in response to a referral sent by Mr. Collene Schlichter Dusek's primary care provider.  An unsuccessful telephone outreach was attempted today. The patient was referred to the case management team for assistance with care management and care coordination.   Follow Up Plan: A HIPAA compliant phone message was left for the patient providing contact information and requesting a return call.  The care management team will reach out to the patient again over the next 7 days.  If patient returns call to provider office, please advise to call [facility]  [facility], [address], [place], [place] [address] Direct Dial: [phone] [email or web address]: [email or web address]

## 2021-06-23 ENCOUNTER — Other Ambulatory Visit: Payer: Self-pay | Admitting: Family Medicine

## 2021-06-23 DIAGNOSIS — I480 Paroxysmal atrial fibrillation: Secondary | ICD-10-CM

## 2021-06-23 DIAGNOSIS — E782 Mixed hyperlipidemia: Secondary | ICD-10-CM

## 2021-06-23 DIAGNOSIS — I10 Essential (primary) hypertension: Secondary | ICD-10-CM

## 2021-06-23 DIAGNOSIS — K219 Gastro-esophageal reflux disease without esophagitis: Secondary | ICD-10-CM

## 2021-06-26 NOTE — Chronic Care Management (AMB) (Signed)
  Chronic Care Management   Note  06/26/2021 Name: Dustin Dominguez MRN: [id number] DOB: [birth year]  Dustin Dominguez is a 80 y.o. year old male who is a primary care patient of [name], FNP. I reached out to [name] by phone today in response to a referral sent by Dustin Dominguez PCP.  Dustin Dominguez was given information about Chronic Care Management services today including:  CCM service includes personalized support from designated clinical staff supervised by his physician, including individualized plan of care and coordination with other care providers 24/7 contact phone numbers for assistance for urgent and routine care needs. Service will only be billed when office clinical staff spend 20 minutes or more in a month to coordinate care. Only one practitioner may furnish and bill the service in a calendar month. The patient may stop CCM services at any time (effective at the end of the month) by phone call to the office staff. The patient is responsible for co-pay (up to 20% after annual deductible is met) if co-pay is required by the individual health plan.   Patient agreed to services and verbal consent obtained.   Follow up plan: Telephone appointment with care management team member scheduled for:07/31/2021  Noreene Larsson, [address], [place], [place] [address] Direct Dial: [phone] [email or web address]

## 2021-07-21 ENCOUNTER — Other Ambulatory Visit: Payer: Self-pay | Admitting: Family Medicine

## 2021-07-21 DIAGNOSIS — N289 Disorder of kidney and ureter, unspecified: Secondary | ICD-10-CM

## 2021-07-31 ENCOUNTER — Telehealth: Payer: Self-pay | Admitting: Pharmacist

## 2021-07-31 ENCOUNTER — Telehealth: Payer: Medicare HMO

## 2021-07-31 NOTE — Telephone Encounter (Signed)
°  Care Management   Follow Up Note   07/31/2021 Name: Dustin Dominguez MRN: [id number] DOB: 1940-07-26   Referred by: [name], FNP Reason for referral : Appointment (UNSUCCESSFUL OUTREACH)   An unsuccessful telephone outreach was attempted today. The patient was referred to the case management team for assistance with care management and care coordination.   Follow Up Plan: The care management team will reach out to the patient again over the next 7 days.     Regina Eck, PharmD, BCPS Clinical Pharmacist, [facility]  [facility] Phone [phone]

## 2021-08-03 ENCOUNTER — Telehealth: Payer: Self-pay

## 2021-08-03 NOTE — Telephone Encounter (Signed)
Pt has been notified.

## 2021-08-03 NOTE — Progress Notes (Signed)
Patient to call back to schedule with Pharm D once he has spent 3%of household income   Noreene Larsson, [address], [place], [place] [address] Direct Dial: [phone] [email or web address]: [email or web address]

## 2021-08-20 ENCOUNTER — Encounter: Payer: Self-pay | Admitting: Family Medicine

## 2021-08-20 ENCOUNTER — Ambulatory Visit (INDEPENDENT_AMBULATORY_CARE_PROVIDER_SITE_OTHER): Payer: Medicare HMO | Admitting: Family Medicine

## 2021-08-20 VITALS — BP 168/68 | HR 73 | Temp 97.5°F | Ht 71.0 in | Wt 193.2 lb

## 2021-08-20 DIAGNOSIS — R1319 Other dysphagia: Secondary | ICD-10-CM | POA: Diagnosis not present

## 2021-08-20 DIAGNOSIS — R0602 Shortness of breath: Secondary | ICD-10-CM

## 2021-08-20 DIAGNOSIS — I10 Essential (primary) hypertension: Secondary | ICD-10-CM

## 2021-08-20 DIAGNOSIS — E782 Mixed hyperlipidemia: Secondary | ICD-10-CM

## 2021-08-20 DIAGNOSIS — K219 Gastro-esophageal reflux disease without esophagitis: Secondary | ICD-10-CM

## 2021-08-20 DIAGNOSIS — K5903 Drug induced constipation: Secondary | ICD-10-CM

## 2021-08-20 DIAGNOSIS — T402X5A Adverse effect of other opioids, initial encounter: Secondary | ICD-10-CM

## 2021-08-20 MED ORDER — OMEPRAZOLE 40 MG PO CPDR: 40.0000 mg | DELAYED_RELEASE_CAPSULE | Freq: Two times a day (BID) | ORAL | 2 refills | Status: AC

## 2021-08-20 MED ORDER — ATORVASTATIN CALCIUM 10 MG PO TABS: 10.0000 mg | ORAL_TABLET | Freq: Every day | ORAL | 1 refills | Status: DC
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
## 2021-08-20 NOTE — Patient Instructions (Signed)
Schedule an appointment with Almyra Free (our clinical pharmacist) once you have spent 3% of your household income.

## 2021-08-21 LAB — CMP14+EGFR
ALT: 17 IU/L (ref 0–44)
AST: 31 IU/L (ref 0–40)
Albumin/Globulin Ratio: 0.9 — ABNORMAL LOW (ref 1.2–2.2)
Albumin: 3.1 g/dL — ABNORMAL LOW (ref 3.7–4.7)
Alkaline Phosphatase: 99 IU/L (ref 44–121)
BUN/Creatinine Ratio: 14 (ref 10–24)
BUN: 12 mg/dL (ref 8–27)
Bilirubin Total: 0.4 mg/dL (ref 0.0–1.2)
CO2: 25 mmol/L (ref 20–29)
Calcium: 8.6 mg/dL (ref 8.6–10.2)
Chloride: 108 mmol/L — ABNORMAL HIGH (ref 96–106)
Creatinine, Ser: 0.86 mg/dL (ref 0.76–1.27)
Globulin, Total: 3.4 g/dL (ref 1.5–4.5)
Glucose: 96 mg/dL (ref 70–99)
Potassium: 4.5 mmol/L (ref 3.5–5.2)
Sodium: 147 mmol/L — ABNORMAL HIGH (ref 134–144)
Total Protein: 6.5 g/dL (ref 6.0–8.5)
eGFR: 89 mL/min/{1.73_m2} (ref >59)

## 2021-08-21 LAB — BRAIN NATRIURETIC PEPTIDE: BNP: 308.9 pg/mL — ABNORMAL HIGH (ref 0.0–100.0)

## 2021-08-24 ENCOUNTER — Other Ambulatory Visit: Payer: Self-pay

## 2021-08-24 DIAGNOSIS — R7989 Other specified abnormal findings of blood chemistry: Secondary | ICD-10-CM

## 2021-08-25 ENCOUNTER — Encounter (INDEPENDENT_AMBULATORY_CARE_PROVIDER_SITE_OTHER): Payer: Self-pay | Admitting: *Deleted

## 2021-08-26 ENCOUNTER — Telehealth: Payer: Self-pay

## 2021-08-26 NOTE — Progress Notes (Signed)
lmtcb

## 2021-08-26 NOTE — Telephone Encounter (Signed)
lmtcb

## 2021-08-26 NOTE — Progress Notes (Signed)
Lmtcb.

## 2021-08-26 NOTE — Telephone Encounter (Signed)
-----   Message from Lavera Guise, [facility] sent at 08/25/2021  7:30 PM EST ----- Regarding: eliquis patient assistance Please let patient know: Before he qualifies for eliquis he must spend 3% out of his pocket at the pharmacy (he and spouse count if applicable)  Example--if he and spouse bring in $2000/month--they have to have proof that they have spent $720 out of pocket at the pharmacy first.  These are the program rules ----- Message ----- From: [name], FNP Sent: 08/20/2021   4:19 PM EST To: Lavera Guise, [place]  Please call patient regarding prescription assistance. He is confused about the 3% of household income before he qualifies for assistance with Eliquis.

## 2021-08-26 NOTE — Progress Notes (Signed)
Patient aware and verbalizes understanding. 

## 2021-09-21 ENCOUNTER — Other Ambulatory Visit: Payer: Self-pay | Admitting: Family Medicine

## 2021-09-21 DIAGNOSIS — I480 Paroxysmal atrial fibrillation: Secondary | ICD-10-CM

## 2021-09-21 DIAGNOSIS — E782 Mixed hyperlipidemia: Secondary | ICD-10-CM

## 2021-09-21 DIAGNOSIS — I10 Essential (primary) hypertension: Secondary | ICD-10-CM

## 2021-10-22 ENCOUNTER — Ambulatory Visit: Payer: Medicare HMO | Admitting: Cardiology

## 2021-10-22 ENCOUNTER — Encounter: Payer: Self-pay | Admitting: Cardiology

## 2021-10-22 VITALS — BP 130/58 | HR 69 | Ht 71.0 in | Wt 193.2 lb

## 2021-10-22 DIAGNOSIS — I25119 Atherosclerotic heart disease of native coronary artery with unspecified angina pectoris: Secondary | ICD-10-CM

## 2021-10-22 DIAGNOSIS — R0602 Shortness of breath: Secondary | ICD-10-CM | POA: Diagnosis not present

## 2021-10-22 DIAGNOSIS — R600 Localized edema: Secondary | ICD-10-CM

## 2021-10-22 DIAGNOSIS — I480 Paroxysmal atrial fibrillation: Secondary | ICD-10-CM | POA: Diagnosis not present

## 2021-10-22 MED ORDER — FUROSEMIDE 20 MG PO TABS
20.0000 mg | ORAL_TABLET | Freq: Every day | ORAL | 3 refills | Status: DC | PRN
Start: 2021-10-22 — End: 2021-10-29
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
## 2021-10-22 NOTE — Patient Instructions (Signed)
Medication Instructions:  ?Your physician has recommended you make the following change in your medication:  ?Start furosemide 20 mg daily as needed for leg swelling ?Continue other medications the same ? ?Labwork: ?none ? ?Testing/Procedures: ?Your physician has requested that you have an echocardiogram. Echocardiography is a painless test that uses sound waves to create images of your heart. It provides your doctor with information about the size and shape of your heart and how well your heart?s chambers and valves are working. This procedure takes approximately one hour. There are no restrictions for this procedure. ? ?Follow-Up: ?Your physician recommends that you schedule a follow-up appointment in: 3 months ? ?Any Other Special Instructions Will Be Listed Below (If Applicable). ? ?If you need a refill on your cardiac medications before your next appointment, please call your pharmacy. ?

## 2021-10-25 ENCOUNTER — Other Ambulatory Visit: Payer: Self-pay

## 2021-10-25 ENCOUNTER — Encounter (HOSPITAL_COMMUNITY): Payer: Self-pay

## 2021-10-25 ENCOUNTER — Emergency Department (HOSPITAL_COMMUNITY): Payer: Medicare HMO

## 2021-10-25 ENCOUNTER — Inpatient Hospital Stay (HOSPITAL_COMMUNITY)
Admission: EM | Admit: 2021-10-25 | Discharge: 2021-10-29 | DRG: 350 | Disposition: A | Discharge status: Home / Self Care | Payer: Medicare HMO | Attending: Family Medicine | Admitting: Family Medicine

## 2021-10-25 DIAGNOSIS — N492 Inflammatory disorders of scrotum: Secondary | ICD-10-CM | POA: Diagnosis present

## 2021-10-25 DIAGNOSIS — F17210 Nicotine dependence, cigarettes, uncomplicated: Secondary | ICD-10-CM | POA: Diagnosis present

## 2021-10-25 DIAGNOSIS — N5089 Other specified disorders of the male genital organs: Secondary | ICD-10-CM | POA: Diagnosis not present

## 2021-10-25 DIAGNOSIS — Z8711 Personal history of peptic ulcer disease: Secondary | ICD-10-CM

## 2021-10-25 DIAGNOSIS — K4090 Unilateral inguinal hernia, without obstruction or gangrene, not specified as recurrent: Secondary | ICD-10-CM

## 2021-10-25 DIAGNOSIS — Z8701 Personal history of pneumonia (recurrent): Secondary | ICD-10-CM

## 2021-10-25 DIAGNOSIS — Z8673 Personal history of transient ischemic attack (TIA), and cerebral infarction without residual deficits: Secondary | ICD-10-CM

## 2021-10-25 DIAGNOSIS — I739 Peripheral vascular disease, unspecified: Secondary | ICD-10-CM | POA: Diagnosis present

## 2021-10-25 DIAGNOSIS — Z20822 Contact with and (suspected) exposure to covid-19: Secondary | ICD-10-CM | POA: Diagnosis present

## 2021-10-25 DIAGNOSIS — I482 Chronic atrial fibrillation, unspecified: Secondary | ICD-10-CM | POA: Diagnosis present

## 2021-10-25 DIAGNOSIS — I480 Paroxysmal atrial fibrillation: Secondary | ICD-10-CM | POA: Diagnosis present

## 2021-10-25 DIAGNOSIS — Z9081 Acquired absence of spleen: Secondary | ICD-10-CM

## 2021-10-25 DIAGNOSIS — Z880 Allergy status to penicillin: Secondary | ICD-10-CM

## 2021-10-25 DIAGNOSIS — N400 Enlarged prostate without lower urinary tract symptoms: Secondary | ICD-10-CM | POA: Diagnosis present

## 2021-10-25 DIAGNOSIS — Z7901 Long term (current) use of anticoagulants: Secondary | ICD-10-CM

## 2021-10-25 DIAGNOSIS — I2510 Atherosclerotic heart disease of native coronary artery without angina pectoris: Secondary | ICD-10-CM | POA: Diagnosis present

## 2021-10-25 DIAGNOSIS — E1151 Type 2 diabetes mellitus with diabetic peripheral angiopathy without gangrene: Secondary | ICD-10-CM | POA: Diagnosis present

## 2021-10-25 DIAGNOSIS — Z833 Family history of diabetes mellitus: Secondary | ICD-10-CM

## 2021-10-25 DIAGNOSIS — I110 Hypertensive heart disease with heart failure: Secondary | ICD-10-CM | POA: Diagnosis present

## 2021-10-25 DIAGNOSIS — K219 Gastro-esophageal reflux disease without esophagitis: Secondary | ICD-10-CM | POA: Diagnosis present

## 2021-10-25 DIAGNOSIS — Z86010 Personal history of colonic polyps: Secondary | ICD-10-CM

## 2021-10-25 DIAGNOSIS — I5033 Acute on chronic diastolic (congestive) heart failure: Secondary | ICD-10-CM | POA: Diagnosis present

## 2021-10-25 DIAGNOSIS — Z800 Family history of malignant neoplasm of digestive organs: Secondary | ICD-10-CM

## 2021-10-25 DIAGNOSIS — I252 Old myocardial infarction: Secondary | ICD-10-CM

## 2021-10-25 DIAGNOSIS — Z885 Allergy status to narcotic agent status: Secondary | ICD-10-CM

## 2021-10-25 DIAGNOSIS — Z888 Allergy status to other drugs, medicaments and biological substances status: Secondary | ICD-10-CM

## 2021-10-25 DIAGNOSIS — N289 Disorder of kidney and ureter, unspecified: Secondary | ICD-10-CM

## 2021-10-25 DIAGNOSIS — Z8249 Family history of ischemic heart disease and other diseases of the circulatory system: Secondary | ICD-10-CM

## 2021-10-25 DIAGNOSIS — E559 Vitamin D deficiency, unspecified: Secondary | ICD-10-CM | POA: Diagnosis present

## 2021-10-25 DIAGNOSIS — Z85820 Personal history of malignant melanoma of skin: Secondary | ICD-10-CM

## 2021-10-25 DIAGNOSIS — Z951 Presence of aortocoronary bypass graft: Secondary | ICD-10-CM

## 2021-10-25 DIAGNOSIS — Z9049 Acquired absence of other specified parts of digestive tract: Secondary | ICD-10-CM

## 2021-10-25 DIAGNOSIS — I4892 Unspecified atrial flutter: Secondary | ICD-10-CM | POA: Diagnosis present

## 2021-10-25 DIAGNOSIS — K4030 Unilateral inguinal hernia, with obstruction, without gangrene, not specified as recurrent: Principal | ICD-10-CM | POA: Diagnosis present

## 2021-10-25 DIAGNOSIS — Z79899 Other long term (current) drug therapy: Secondary | ICD-10-CM

## 2021-10-25 DIAGNOSIS — F419 Anxiety disorder, unspecified: Secondary | ICD-10-CM | POA: Diagnosis present

## 2021-10-25 DIAGNOSIS — E782 Mixed hyperlipidemia: Secondary | ICD-10-CM | POA: Diagnosis present

## 2021-10-25 DIAGNOSIS — I10 Essential (primary) hypertension: Secondary | ICD-10-CM | POA: Diagnosis present

## 2021-10-25 LAB — BASIC METABOLIC PANEL
Anion gap: 6 (ref 5–15)
BUN: 17 mg/dL (ref 8–23)
CO2: 24 mmol/L (ref 22–32)
Calcium: 7.7 mg/dL — ABNORMAL LOW (ref 8.9–10.3)
Chloride: 112 mmol/L — ABNORMAL HIGH (ref 98–111)
Creatinine, Ser: 0.9 mg/dL (ref 0.61–1.24)
GFR, Estimated: >60 mL/min (ref >60)
Glucose, Bld: 127 mg/dL — ABNORMAL HIGH (ref 70–99)
Potassium: 4.4 mmol/L (ref 3.5–5.1)
Sodium: 142 mmol/L (ref 135–145)

## 2021-10-25 LAB — URINALYSIS, ROUTINE W REFLEX MICROSCOPIC
Bacteria, UA: NONE SEEN
Bilirubin Urine: NEGATIVE
Glucose, UA: NEGATIVE mg/dL
Hgb urine dipstick: NEGATIVE
Ketones, ur: NEGATIVE mg/dL
Nitrite: NEGATIVE
Protein, ur: 100 mg/dL — AB
Specific Gravity, Urine: 1.017 (ref 1.005–1.030)
pH: 5 (ref 5.0–8.0)

## 2021-10-25 LAB — CBC
HCT: 34.9 % — ABNORMAL LOW (ref 39.0–52.0)
Hemoglobin: 11.1 g/dL — ABNORMAL LOW (ref 13.0–17.0)
MCH: 29.4 pg (ref 26.0–34.0)
MCHC: 31.8 g/dL (ref 30.0–36.0)
MCV: 92.3 fL (ref 80.0–100.0)
Platelets: 493 10*3/uL — ABNORMAL HIGH (ref 150–400)
RBC: 3.78 MIL/uL — ABNORMAL LOW (ref 4.22–5.81)
RDW: 17.1 % — ABNORMAL HIGH (ref 11.5–15.5)
WBC: 15.2 10*3/uL — ABNORMAL HIGH (ref 4.0–10.5)
nRBC: 0 % (ref 0.0–0.2)

## 2021-10-25 LAB — BRAIN NATRIURETIC PEPTIDE: B Natriuretic Peptide: 298 pg/mL — ABNORMAL HIGH (ref 0.0–100.0)

## 2021-10-25 MED ORDER — IOHEXOL 300 MG/ML  SOLN
100.0000 mL | Freq: Once | INTRAMUSCULAR | Status: AC | PRN
  Administered 2021-10-25: 100 mL via INTRAVENOUS

## 2021-10-25 NOTE — ED Triage Notes (Signed)
Patient with testicle swelling that started 6 days prior.  ?

## 2021-10-25 NOTE — ED Notes (Signed)
Rechecked pt. Swelling noted to testicles, left worse than right. Pt daughter went to desk and stated pt in pain. EDP advised with no additional orders.  ?

## 2021-10-25 NOTE — ED Notes (Signed)
Patient transported to CT 

## 2021-10-25 NOTE — ED Provider Notes (Signed)
? ?[facility]  ?Provider Note ? ?CSN: [id number] ?Arrival date & time: 10/25/21 1602 ? ?History ?Chief Complaint  ?Patient presents with  ? Groin Swelling  ? ? ?HASAN DOUSE is a 81 y.o. male with history of CAD and CHF as well as remote scrotal abscess (2017) reports about 6 days of scrotal swelling and tenderness extending to his lower abdomen. Saw his Cardiologist for LE edema 3 days ago and was started on Lasix. He has not had any fever. He finds it difficult to urinate due to the swelling. He has chronic SOB. No chest pains.  ? ? ?Home Medications ?Prior to Admission medications   ?Medication Sig Start Date End Date Taking? Authorizing Provider  ?amLODipine (NORVASC) 5 MG tablet TAKE 1 TABLET BY MOUTH DAILY 09/21/21   [name], FNP  ?atorvastatin (LIPITOR) 10 MG tablet Take 1 tablet (10 mg total) by mouth daily. 08/20/21   [name], FNP  ?cefpodoxime (VANTIN) 200 MG tablet Take 100 mg by mouth 2 (two) times daily. 06/08/21   [provider]  ?ELIQUIS 5 MG TABS tablet TAKE 1 TABLET BY MOUTH TWICE DAILY 09/21/21   [name], FNP  ?feeding supplement, ENSURE ENLIVE, (ENSURE ENLIVE) LIQD Take 237 mLs by mouth 2 (two) times daily between meals. 08/23/18   Barton Dubois, MD  ?fenofibrate 160 MG tablet TAKE 1 TABLET BY MOUTH DAILY 09/21/21   [name], FNP  ?furosemide (LASIX) 20 MG tablet Take 1 tablet (20 mg total) by mouth daily as needed (leg swelling). 10/22/21   Satira Sark, MD  ?isosorbide mononitrate (IMDUR) 30 MG 24 hr tablet TAKE 1 TABLET BY MOUTH DAILY 09/21/21   Hendricks Limes F, FNP  ?lactulose (CONSTULOSE) 10 GM/15ML solution TAKE ONE TABLESPOONFUL (15ML) BY MOUTH DAILY AS NEEDED FOR MILD CONSTIPATION, MODERATE CONSTIPATION OR SEVERE CONSTIPATION (NEEDS TO BE SEEN BEFORE NEXT REFILL) 12/31/20   [name], FNP  ?lisinopril (ZESTRIL) 5 MG tablet TAKE 1 TABLET BY MOUTH DAILY 07/21/21   [name], FNP  ?metoprolol tartrate (LOPRESSOR) 25 MG  tablet TAKE 1 TABLET BY MOUTH TWICE DAILY 09/21/21   [name], FNP  ?morphine (MS CONTIN) 30 MG 12 hr tablet Take 30 mg by mouth 3 (three) times daily. 06/13/15   [provider]  ?omeprazole (PRILOSEC) 40 MG capsule Take 1 capsule (40 mg total) by mouth 2 (two) times daily. 08/20/21   [name], FNP  ?oxyCODONE-acetaminophen (PERCOCET) 10-325 MG tablet Take 1 tablet by mouth 6 (six) times daily.    [provider]  ?VENTOLIN HFA 108 (90 Base) MCG/ACT inhaler INHALE TWO PUFFS INTO THE LUNGS EVERY 6 HOURS AS NEEDED FOR WHEEZING OR SHORTNESS OF BREATH 11/15/19   [name], FNP  ?vitamin B-12 (CYANOCOBALAMIN) 1000 MCG tablet Take 1,000 mcg by mouth daily.    [provider]  ? ? ? ?Allergies    ?Amitriptyline, Bextra [valdecoxib], Codeine, Cymbalta [duloxetine hcl], Esomeprazole magnesium, Niacin-lovastatin er, Niaspan [niacin er], and Penicillins ? ? ?Review of Systems   ?Review of Systems ?Please see HPI for pertinent positives and negatives ? ?Physical Exam ?BP (!) 159/88   Pulse 75   Temp 98 ?F (36.7 ?C) (Oral)   Resp 17   Ht '5\' 11"'$  (1.803 m)   Wt 86.2 kg   SpO2 96%   BMI 26.50 kg/m?  ? ?Physical Exam ?Vitals and nursing note reviewed.  ?Constitutional:   ?   Appearance: Normal appearance.  ?  HENT:  ?   Head: Normocephalic and atraumatic.  ?   Nose: Nose normal.  ?   Mouth/Throat:  ?   Mouth: Mucous membranes are moist.  ?Eyes:  ?   Extraocular Movements: Extraocular movements intact.  ?   Conjunctiva/sclera: Conjunctivae normal.  ?Cardiovascular:  ?   Rate and Rhythm: Normal rate.  ?Pulmonary:  ?   Effort: Pulmonary effort is normal.  ?   Breath sounds: Normal breath sounds.  ?Abdominal:  ?   General: Abdomen is flat.  ?   Palpations: Abdomen is soft.  ?   Tenderness: There is no abdominal tenderness.  ?Genitourinary: ?   Comments: Marked scrotal swelling, L>R with bilateral testicular tenderness and soft tissue edema extending to the lower abdomen. There may be an  inguinal hernia on the L, but edema makes this difficult to discern.  ?Musculoskeletal:     ?   General: No swelling. Normal range of motion.  ?   Cervical back: Neck supple.  ?   Right lower leg: Edema (trace) present.  ?   Left lower leg: Edema (trace) present.  ?Skin: ?   General: Skin is warm and dry.  ?Neurological:  ?   General: No focal deficit present.  ?   Mental Status: He is alert.  ?Psychiatric:     ?   Mood and Affect: Mood normal.  ? ? ?ED Results / Procedures / Treatments   ?EKG ?EKG Interpretation ? ?Date/Time:  Monday October 26 2021 00:03:36 EDT ?Ventricular Rate:  70 ?PR Interval:  229 ?QRS Duration: 146 ?QT Interval:  460 ?QTC Calculation: 497 ?R Axis:   32 ?Text Interpretation: Sinus rhythm Prolonged PR interval Left bundle branch block No significant change since last tracing Confirmed by Calvert Cantor [phone]) on 10/26/2021 12:07:38 AM ? ?Procedures ?Procedures ? ?Medications Ordered in the ED ?Medications  ?iohexol (OMNIPAQUE) 300 MG/ML solution 100 mL (100 mLs Intravenous Contrast Given 10/25/21 2316)  ?furosemide (LASIX) injection 40 mg (40 mg Intravenous Given 10/26/21 0040)  ? ? ?Initial Impression and Plan ? Patient here with scrotal swelling/edema. Doubt torsion given duration of symptoms. Could be edema from CHF vs hernia vs orchitis. Labs done in triage show CBC with mild leukocytosis, BMP is unremarkable. UA not yet collected. Will add BNP, CXR and EKG for evaluation of possible cardiac etiology. [place] is not available at this time, will send for CT to evaluate hernia vs other signs of infection.  ? ?ED Course  ? ?Clinical Course as of 10/26/21 0107  ?Mon Oct 26, 2021  ?0030 I personally viewed the images from radiology studies and agree with radiologist interpretation: hernia with fat and ascites with fluid tracking into scrotum. There is no external signs of ifnection on his scrotal exam so doubt cellulitis or fornier's. Reviewed the CT images with Dr. Constance Haw on call for General Surgery.  I suspect that his scrotal edema is from his known CHF. Will begin Lasix. Admit to medicine and Surgery will consult in the AM.  ? [CS]  ?87 Spoke with Dr. Josephine Cables, Hospitalist, who will evaluate for admission.  [CS]  ?  ?Clinical Course User Index ?[CS] Truddie Hidden, MD  ? ? ? ?MDM Rules/Calculators/A&P ?Medical Decision Making ?Problems Addressed: ?Scrotal edema: acute illness or injury ?Unilateral inguinal hernia without obstruction or gangrene, recurrence not specified: chronic illness or injury ? ?Amount and/or Complexity of Data Reviewed ?Labs: ordered. ?Radiology: ordered. ? ?Risk ?Prescription drug management. ?Decision regarding hospitalization. ? ? ? ?Final Clinical Impression(s) /  ED Diagnoses ?Final diagnoses:  ?Scrotal edema  ?Unilateral inguinal hernia without obstruction or gangrene, recurrence not specified  ? ? ?Rx / DC Orders ?ED Discharge Orders   ? ? None  ? ?  ? ?  ?Truddie Hidden, MD ?10/26/21 0107 ? ?

## 2021-10-26 ENCOUNTER — Other Ambulatory Visit: Payer: Self-pay

## 2021-10-26 ENCOUNTER — Inpatient Hospital Stay (HOSPITAL_COMMUNITY): Payer: Medicare HMO

## 2021-10-26 ENCOUNTER — Encounter (HOSPITAL_COMMUNITY): Payer: Self-pay | Admitting: Internal Medicine

## 2021-10-26 DIAGNOSIS — E782 Mixed hyperlipidemia: Secondary | ICD-10-CM | POA: Diagnosis present

## 2021-10-26 DIAGNOSIS — Z8711 Personal history of peptic ulcer disease: Secondary | ICD-10-CM | POA: Diagnosis not present

## 2021-10-26 DIAGNOSIS — Z8249 Family history of ischemic heart disease and other diseases of the circulatory system: Secondary | ICD-10-CM | POA: Diagnosis not present

## 2021-10-26 DIAGNOSIS — E1151 Type 2 diabetes mellitus with diabetic peripheral angiopathy without gangrene: Secondary | ICD-10-CM | POA: Diagnosis present

## 2021-10-26 DIAGNOSIS — N492 Inflammatory disorders of scrotum: Secondary | ICD-10-CM | POA: Diagnosis present

## 2021-10-26 DIAGNOSIS — I10 Essential (primary) hypertension: Secondary | ICD-10-CM | POA: Diagnosis not present

## 2021-10-26 DIAGNOSIS — F17210 Nicotine dependence, cigarettes, uncomplicated: Secondary | ICD-10-CM | POA: Diagnosis present

## 2021-10-26 DIAGNOSIS — Z85820 Personal history of malignant melanoma of skin: Secondary | ICD-10-CM | POA: Diagnosis not present

## 2021-10-26 DIAGNOSIS — I739 Peripheral vascular disease, unspecified: Secondary | ICD-10-CM | POA: Diagnosis not present

## 2021-10-26 DIAGNOSIS — I2510 Atherosclerotic heart disease of native coronary artery without angina pectoris: Secondary | ICD-10-CM | POA: Diagnosis present

## 2021-10-26 DIAGNOSIS — Z20822 Contact with and (suspected) exposure to covid-19: Secondary | ICD-10-CM | POA: Diagnosis present

## 2021-10-26 DIAGNOSIS — I5033 Acute on chronic diastolic (congestive) heart failure: Secondary | ICD-10-CM

## 2021-10-26 DIAGNOSIS — I4892 Unspecified atrial flutter: Secondary | ICD-10-CM | POA: Diagnosis present

## 2021-10-26 DIAGNOSIS — Z86010 Personal history of colonic polyps: Secondary | ICD-10-CM | POA: Diagnosis not present

## 2021-10-26 DIAGNOSIS — N5089 Other specified disorders of the male genital organs: Secondary | ICD-10-CM

## 2021-10-26 DIAGNOSIS — Z7901 Long term (current) use of anticoagulants: Secondary | ICD-10-CM | POA: Diagnosis not present

## 2021-10-26 DIAGNOSIS — I480 Paroxysmal atrial fibrillation: Secondary | ICD-10-CM | POA: Diagnosis present

## 2021-10-26 DIAGNOSIS — Z8701 Personal history of pneumonia (recurrent): Secondary | ICD-10-CM | POA: Diagnosis not present

## 2021-10-26 DIAGNOSIS — I252 Old myocardial infarction: Secondary | ICD-10-CM | POA: Diagnosis not present

## 2021-10-26 DIAGNOSIS — K4030 Unilateral inguinal hernia, with obstruction, without gangrene, not specified as recurrent: Secondary | ICD-10-CM | POA: Diagnosis present

## 2021-10-26 DIAGNOSIS — K4090 Unilateral inguinal hernia, without obstruction or gangrene, not specified as recurrent: Secondary | ICD-10-CM

## 2021-10-26 DIAGNOSIS — Z800 Family history of malignant neoplasm of digestive organs: Secondary | ICD-10-CM | POA: Diagnosis not present

## 2021-10-26 DIAGNOSIS — I482 Chronic atrial fibrillation, unspecified: Secondary | ICD-10-CM | POA: Diagnosis present

## 2021-10-26 DIAGNOSIS — I110 Hypertensive heart disease with heart failure: Secondary | ICD-10-CM | POA: Diagnosis present

## 2021-10-26 DIAGNOSIS — I25119 Atherosclerotic heart disease of native coronary artery with unspecified angina pectoris: Secondary | ICD-10-CM | POA: Diagnosis not present

## 2021-10-26 DIAGNOSIS — N400 Enlarged prostate without lower urinary tract symptoms: Secondary | ICD-10-CM | POA: Diagnosis present

## 2021-10-26 DIAGNOSIS — Z833 Family history of diabetes mellitus: Secondary | ICD-10-CM | POA: Diagnosis not present

## 2021-10-26 DIAGNOSIS — Z8673 Personal history of transient ischemic attack (TIA), and cerebral infarction without residual deficits: Secondary | ICD-10-CM | POA: Diagnosis not present

## 2021-10-26 DIAGNOSIS — Z79899 Other long term (current) drug therapy: Secondary | ICD-10-CM | POA: Diagnosis not present

## 2021-10-26 LAB — ECHOCARDIOGRAM COMPLETE
AR max vel: 1.81 cm2
AV Area VTI: 1.92 cm2
AV Area mean vel: 1.56 cm2
AV Mean grad: 5 mmHg
AV Peak grad: 10 mmHg
Ao pk vel: 1.58 m/s
Area-P 1/2: 3.68 cm2
Height: 71 in
MV VTI: 1.97 cm2
S' Lateral: 3.4 cm
Weight: 3040 oz

## 2021-10-26 MED ORDER — MORPHINE SULFATE ER 30 MG PO TBCR
30.0000 mg | EXTENDED_RELEASE_TABLET | Freq: Three times a day (TID) | ORAL | Status: DC
  Administered 2021-10-26 – 2021-10-29 (×10): 30 mg via ORAL
  Filled 2021-10-26 (×11): qty 1

## 2021-10-26 MED ORDER — ACETAMINOPHEN 325 MG PO TABS
650.0000 mg | ORAL_TABLET | Freq: Four times a day (QID) | ORAL | Status: DC | PRN
Start: 2021-10-26 — End: 2021-10-29

## 2021-10-26 MED ORDER — FUROSEMIDE 10 MG/ML IJ SOLN
40.0000 mg | Freq: Two times a day (BID) | INTRAMUSCULAR | Status: DC
  Administered 2021-10-26 – 2021-10-29 (×7): 40 mg via INTRAVENOUS
  Filled 2021-10-26 (×7): qty 4

## 2021-10-26 MED ORDER — SODIUM CHLORIDE 0.9 % IV SOLN: 1.0000 g | Freq: Once | INTRAVENOUS | Status: DC

## 2021-10-26 MED ORDER — VITAMIN B-12 1000 MCG PO TABS
1000.0000 ug | ORAL_TABLET | Freq: Every day | ORAL | Status: DC
  Administered 2021-10-26 – 2021-10-29 (×4): 1000 ug via ORAL
  Filled 2021-10-26 (×4): qty 1

## 2021-10-26 MED ORDER — BISACODYL 10 MG RE SUPP
10.0000 mg | Freq: Every day | RECTAL | Status: DC | PRN
Start: 2021-10-26 — End: 2021-10-29

## 2021-10-26 MED ORDER — ONDANSETRON HCL 4 MG PO TABS: 4.0000 mg | ORAL_TABLET | Freq: Four times a day (QID) | ORAL | Status: DC | PRN

## 2021-10-26 MED ORDER — PERFLUTREN LIPID MICROSPHERE
1.0000 mL | INTRAVENOUS | Status: AC | PRN
  Administered 2021-10-26: 5 mL via INTRAVENOUS
  Filled 2021-10-26: qty 10

## 2021-10-26 MED ORDER — METRONIDAZOLE 500 MG/100ML IV SOLN
500.0000 mg | Freq: Two times a day (BID) | INTRAVENOUS | Status: DC
  Administered 2021-10-26 – 2021-10-28 (×4): 500 mg via INTRAVENOUS
  Filled 2021-10-26 (×5): qty 100

## 2021-10-26 MED ORDER — TRAZODONE HCL 50 MG PO TABS
50.0000 mg | ORAL_TABLET | Freq: Every evening | ORAL | Status: DC | PRN
  Administered 2021-10-29: 50 mg via ORAL
  Filled 2021-10-26: qty 1

## 2021-10-26 MED ORDER — APIXABAN 5 MG PO TABS: 5.0000 mg | ORAL_TABLET | Freq: Two times a day (BID) | ORAL | Status: DC

## 2021-10-26 MED ORDER — VANCOMYCIN HCL IN DEXTROSE 1-5 GM/200ML-% IV SOLN
1000.0000 mg | Freq: Two times a day (BID) | INTRAVENOUS | Status: DC
  Administered 2021-10-27 – 2021-10-29 (×4): 1000 mg via INTRAVENOUS
  Filled 2021-10-26 (×4): qty 200

## 2021-10-26 MED ORDER — HYDROMORPHONE HCL 1 MG/ML IJ SOLN
1.0000 mg | INTRAMUSCULAR | Status: DC | PRN
  Filled 2021-10-26: qty 1

## 2021-10-26 MED ORDER — SODIUM CHLORIDE 0.9% FLUSH: 3.0000 mL | INTRAVENOUS | Status: DC | PRN

## 2021-10-26 MED ORDER — SODIUM CHLORIDE 0.9% FLUSH
3.0000 mL | Freq: Two times a day (BID) | INTRAVENOUS | Status: DC
  Administered 2021-10-27 – 2021-10-29 (×3): 3 mL via INTRAVENOUS

## 2021-10-26 MED ORDER — LISINOPRIL 5 MG PO TABS
5.0000 mg | ORAL_TABLET | Freq: Every day | ORAL | Status: DC
  Administered 2021-10-26 – 2021-10-29 (×3): 5 mg via ORAL
  Filled 2021-10-26 (×4): qty 1

## 2021-10-26 MED ORDER — PANTOPRAZOLE SODIUM 40 MG PO TBEC
40.0000 mg | DELAYED_RELEASE_TABLET | Freq: Every day | ORAL | Status: DC
  Administered 2021-10-26 – 2021-10-29 (×4): 40 mg via ORAL
  Filled 2021-10-26 (×4): qty 1

## 2021-10-26 MED ORDER — LACTULOSE 10 GM/15ML PO SOLN
10.0000 g | Freq: Two times a day (BID) | ORAL | Status: DC
  Administered 2021-10-29: 10 g via ORAL
  Filled 2021-10-26 (×5): qty 30

## 2021-10-26 MED ORDER — SODIUM CHLORIDE 0.9 % IV SOLN
250.0000 mL | INTRAVENOUS | Status: DC | PRN
  Administered 2021-10-26: 250 mL via INTRAVENOUS

## 2021-10-26 MED ORDER — FUROSEMIDE 10 MG/ML IJ SOLN
40.0000 mg | Freq: Once | INTRAMUSCULAR | Status: AC
  Administered 2021-10-26: 40 mg via INTRAVENOUS
  Filled 2021-10-26: qty 4

## 2021-10-26 MED ORDER — ISOSORBIDE MONONITRATE ER 60 MG PO TB24
30.0000 mg | ORAL_TABLET | Freq: Every day | ORAL | Status: DC
  Administered 2021-10-26 – 2021-10-29 (×4): 30 mg via ORAL
  Filled 2021-10-26 (×4): qty 1

## 2021-10-26 MED ORDER — VANCOMYCIN HCL IN DEXTROSE 1-5 GM/200ML-% IV SOLN
1000.0000 mg | Freq: Once | INTRAVENOUS | Status: AC
  Administered 2021-10-26: 1000 mg via INTRAVENOUS
  Filled 2021-10-26: qty 200

## 2021-10-26 MED ORDER — OXYCODONE-ACETAMINOPHEN 5-325 MG PO TABS
1.0000 | ORAL_TABLET | ORAL | Status: DC
  Administered 2021-10-26 – 2021-10-29 (×18): 1 via ORAL
  Filled 2021-10-26 (×19): qty 1

## 2021-10-26 MED ORDER — AMLODIPINE BESYLATE 5 MG PO TABS
5.0000 mg | ORAL_TABLET | Freq: Every day | ORAL | Status: DC
Start: 2021-10-26 — End: 2021-10-29
  Administered 2021-10-26 – 2021-10-29 (×3): 5 mg via ORAL
  Filled 2021-10-26 (×4): qty 1

## 2021-10-26 MED ORDER — ONDANSETRON HCL 4 MG/2ML IJ SOLN
4.0000 mg | Freq: Four times a day (QID) | INTRAMUSCULAR | Status: DC | PRN
Start: 2021-10-26 — End: 2021-10-29

## 2021-10-26 MED ORDER — OXYCODONE-ACETAMINOPHEN 10-325 MG PO TABS
1.0000 | ORAL_TABLET | Freq: Every day | ORAL | Status: DC
Start: 2021-10-26 — End: 2021-10-26

## 2021-10-26 MED ORDER — SODIUM CHLORIDE 0.9 % IV SOLN
2.0000 g | Freq: Three times a day (TID) | INTRAVENOUS | Status: DC
  Administered 2021-10-26 – 2021-10-28 (×5): 2 g via INTRAVENOUS
  Filled 2021-10-26 (×5): qty 12.5

## 2021-10-26 MED ORDER — METOPROLOL TARTRATE 25 MG PO TABS
25.0000 mg | ORAL_TABLET | Freq: Two times a day (BID) | ORAL | Status: DC
  Administered 2021-10-26 – 2021-10-29 (×6): 25 mg via ORAL
  Filled 2021-10-26 (×8): qty 1

## 2021-10-26 MED ORDER — FENOFIBRATE 160 MG PO TABS
160.0000 mg | ORAL_TABLET | Freq: Every day | ORAL | Status: DC
  Administered 2021-10-26 – 2021-10-29 (×4): 160 mg via ORAL
  Filled 2021-10-26 (×4): qty 1

## 2021-10-26 MED ORDER — ATORVASTATIN CALCIUM 10 MG PO TABS
10.0000 mg | ORAL_TABLET | Freq: Every day | ORAL | Status: DC
  Administered 2021-10-26 – 2021-10-28 (×3): 10 mg via ORAL
  Filled 2021-10-26 (×3): qty 1

## 2021-10-26 MED ORDER — SODIUM CHLORIDE 0.9% FLUSH
3.0000 mL | Freq: Two times a day (BID) | INTRAVENOUS | Status: DC
  Administered 2021-10-26 – 2021-10-29 (×6): 3 mL via INTRAVENOUS

## 2021-10-26 MED ORDER — ACETAMINOPHEN 650 MG RE SUPP: 650.0000 mg | Freq: Four times a day (QID) | RECTAL | Status: DC | PRN

## 2021-10-26 MED ORDER — OXYCODONE HCL 5 MG PO TABS
5.0000 mg | ORAL_TABLET | ORAL | Status: DC
  Administered 2021-10-26 – 2021-10-29 (×18): 5 mg via ORAL
  Filled 2021-10-26 (×19): qty 1

## 2021-10-26 NOTE — Plan of Care (Signed)
Pt admitted for swelling of scrotum. A&OX4. C/o pain of 10. BP (!) 153/72 (BP Location: Right Arm)   Pulse 72   Temp 98.3 ?F (36.8 ?C) (Oral)   Resp 18   Ht '5\' 11"'$  (1.803 m)   Wt 86.2 kg   SpO2 96%   BMI 26.50 kg/m?  ?2/4 rails up, bed in lowest position, brakes on and call bell nearby. PT requested PRN pain medicine. No skin issues noted. Pt placed on purewick for comfort r/t admitting dx. ?Louanne Skye, RN ?10/26/21 ?3:33 AM ? ?

## 2021-10-26 NOTE — TOC Progression Note (Signed)
Transition of Care (TOC) - Progression Note  ? ? ?Patient Details  ?Name: BRASON BERTHELOT ?MRN: [id number] ?Date of Birth: Sep 27, 1940 ? ?Transition of Care (TOC) CM/SW Contact  ?Salome Arnt, LCSW ?Phone Number: ?10/26/2021, 10:24 AM ? ?Clinical Narrative:   ?Transition of Care (TOC) Screening Note ? ? ?Patient Details  ?Name: ZAYLYN BERGDOLL ?Date of Birth: 1940-09-30 ? ? ?Transition of Care (TOC) CM/SW Contact:    ?Salome Arnt, LCSW ?Phone Number: ?10/26/2021, 10:24 AM ? ? ? ?Transition of Care Department [facility]) has reviewed patient and no TOC needs have been identified at this time. We will continue to monitor patient advancement through interdisciplinary progression rounds. If new patient transition needs arise, please place a TOC consult. ?   ? ? ? ?  ?Barriers to Discharge: Continued Medical Work up ? ?Expected Discharge Plan and Services ?  ?  ?  ?  ?  ?                ?  ?  ?  ?  ?  ?  ?  ?  ?  ?  ? ? ?Social Determinants of Health (SDOH) Interventions ?  ? ?Readmission Risk Interventions ?   ? View : No data to display.  ?  ?  ?  ? ? ?

## 2021-10-26 NOTE — Progress Notes (Signed)
*  PRELIMINARY RESULTS* ?Echocardiogram ?2D Echocardiogram has been performed. ? ?[facility] ?10/26/2021, 12:57 PM ?
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
## 2021-10-26 NOTE — Progress Notes (Signed)
Pharmacy Antibiotic Note ? ?Dustin Dominguez is a 81 y.o. male admitted on 10/25/2021 with  groin cellulitis .  Pharmacy has been consulted for vanc/cefepime dosing. ? ?Pt was admitted for incarcerated inguinal hernia. Surgery is pending apixaban washout. PCN was listed as allergy but he has received zosyn and cefepime before. D/w Dr. Josephine Cables, we will change merrem to cefepime.  ? ?Scr 0.9 ? ?Plan: ?Vanc 1g IV q12>> Trough 10-15 ?Cefepime 2g IV q8 ? ? ?Height: '5\' 11"'$  (180.3 cm) ?Weight: 86.2 kg (190 lb) ?IBW/kg (Calculated) : 75.3 ? ?Temp (24hrs), Avg:98.2 ?F (36.8 ?C), Min:98 ?F (36.7 ?C), Max:98.5 ?F (36.9 ?C) ? ?Recent Labs  ?Lab 10/25/21 ?1736  ?WBC 15.2*  ?CREATININE 0.90  ?  ?Estimated Creatinine Clearance: 70.9 mL/min (by C-G formula based on SCr of 0.9 mg/dL).   ? ?Allergies  ?Allergen Reactions  ? Amitriptyline Other (See Comments)  ?  sleepy  ? Bextra [Valdecoxib] Other (See Comments)  ?  unknown  ? Codeine Nausea And Vomiting  ? Cymbalta [Duloxetine Hcl] Swelling and Other (See Comments)  ?  dizzy  ? Esomeprazole Magnesium Diarrhea  ? Niacin-Lovastatin Er Other (See Comments)  ?  headache  ? Niaspan [Niacin Er] Other (See Comments)  ?  Increased headache  ? Penicillins Rash  ?  Has patient had a PCN reaction causing immediate rash, facial/tongue/throat swelling, SOB or lightheadedness with hypotension: Yes ?Has patient had a PCN reaction causing severe rash involving mucus membranes or skin necrosis: No ?Has patient had a PCN reaction that required hospitalization No ?Has patient had a PCN reaction occurring within the last 10 years: No ?Tolerated Zosyn 06/2016 ?  ? ? ?Antimicrobials this admission: ?4/10 vanc>> ?4/10 cefepime>> ?4/10 flagyl>> ? ?Dose adjustments this admission: ? ? ?Microbiology results: ? ? ?Onnie Boer, PharmD, BCIDP, AAHIVP, CPP ?Infectious Disease Pharmacist ?10/26/2021 7:54 PM ? ? ?

## 2021-10-27 DIAGNOSIS — N5089 Other specified disorders of the male genital organs: Secondary | ICD-10-CM | POA: Diagnosis not present

## 2021-10-27 LAB — CBC
HCT: 32.1 % — ABNORMAL LOW (ref 39.0–52.0)
Hemoglobin: 10.3 g/dL — ABNORMAL LOW (ref 13.0–17.0)
MCH: 29 pg (ref 26.0–34.0)
MCHC: 32.1 g/dL (ref 30.0–36.0)
MCV: 90.4 fL (ref 80.0–100.0)
Platelets: 473 10*3/uL — ABNORMAL HIGH (ref 150–400)
RBC: 3.55 MIL/uL — ABNORMAL LOW (ref 4.22–5.81)
RDW: 16.6 % — ABNORMAL HIGH (ref 11.5–15.5)
WBC: 15.1 10*3/uL — ABNORMAL HIGH (ref 4.0–10.5)
nRBC: 0 % (ref 0.0–0.2)

## 2021-10-27 LAB — BASIC METABOLIC PANEL
Anion gap: 7 (ref 5–15)
BUN: 17 mg/dL (ref 8–23)
CO2: 30 mmol/L (ref 22–32)
Calcium: 8 mg/dL — ABNORMAL LOW (ref 8.9–10.3)
Chloride: 102 mmol/L (ref 98–111)
Creatinine, Ser: 1.09 mg/dL (ref 0.61–1.24)
GFR, Estimated: >60 mL/min (ref >60)
Glucose, Bld: 94 mg/dL (ref 70–99)
Potassium: 3.8 mmol/L (ref 3.5–5.1)
Sodium: 139 mmol/L (ref 135–145)

## 2021-10-27 MED ORDER — CHLORHEXIDINE GLUCONATE CLOTH 2 % EX PADS
6.0000 | MEDICATED_PAD | Freq: Once | CUTANEOUS | Status: AC
  Administered 2021-10-27: 6 via TOPICAL
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
## 2021-10-28 ENCOUNTER — Encounter (HOSPITAL_COMMUNITY)
Admission: EM | Disposition: A | Discharge status: Home / Self Care | Payer: Self-pay | Source: Home / Self Care | Attending: Family Medicine

## 2021-10-28 ENCOUNTER — Other Ambulatory Visit: Payer: Self-pay

## 2021-10-28 ENCOUNTER — Inpatient Hospital Stay (HOSPITAL_COMMUNITY): Payer: Medicare HMO | Admitting: Anesthesiology

## 2021-10-28 ENCOUNTER — Encounter (HOSPITAL_COMMUNITY): Payer: Self-pay | Admitting: Internal Medicine

## 2021-10-28 ENCOUNTER — Other Ambulatory Visit: Payer: Self-pay | Admitting: Family Medicine

## 2021-10-28 DIAGNOSIS — I5032 Chronic diastolic (congestive) heart failure: Secondary | ICD-10-CM

## 2021-10-28 DIAGNOSIS — F17210 Nicotine dependence, cigarettes, uncomplicated: Secondary | ICD-10-CM

## 2021-10-28 DIAGNOSIS — N492 Inflammatory disorders of scrotum: Secondary | ICD-10-CM | POA: Diagnosis not present

## 2021-10-28 DIAGNOSIS — I25119 Atherosclerotic heart disease of native coronary artery with unspecified angina pectoris: Secondary | ICD-10-CM

## 2021-10-28 DIAGNOSIS — I10 Essential (primary) hypertension: Secondary | ICD-10-CM

## 2021-10-28 DIAGNOSIS — N289 Disorder of kidney and ureter, unspecified: Secondary | ICD-10-CM

## 2021-10-28 DIAGNOSIS — I252 Old myocardial infarction: Secondary | ICD-10-CM

## 2021-10-28 DIAGNOSIS — I480 Paroxysmal atrial fibrillation: Secondary | ICD-10-CM | POA: Diagnosis not present

## 2021-10-28 DIAGNOSIS — I110 Hypertensive heart disease with heart failure: Secondary | ICD-10-CM

## 2021-10-28 DIAGNOSIS — I739 Peripheral vascular disease, unspecified: Secondary | ICD-10-CM | POA: Diagnosis not present

## 2021-10-28 DIAGNOSIS — K4090 Unilateral inguinal hernia, without obstruction or gangrene, not specified as recurrent: Secondary | ICD-10-CM

## 2021-10-28 DIAGNOSIS — N5089 Other specified disorders of the male genital organs: Secondary | ICD-10-CM | POA: Diagnosis not present

## 2021-10-28 HISTORY — PX: INGUINAL HERNIA REPAIR: SHX194

## 2021-10-28 LAB — CBC
HCT: 33.1 % — ABNORMAL LOW (ref 39.0–52.0)
Hemoglobin: 10.5 g/dL — ABNORMAL LOW (ref 13.0–17.0)
MCH: 28.8 pg (ref 26.0–34.0)
MCHC: 31.7 g/dL (ref 30.0–36.0)
MCV: 90.9 fL (ref 80.0–100.0)
Platelets: 485 10*3/uL — ABNORMAL HIGH (ref 150–400)
RBC: 3.64 MIL/uL — ABNORMAL LOW (ref 4.22–5.81)
RDW: 16.2 % — ABNORMAL HIGH (ref 11.5–15.5)
WBC: 17.7 10*3/uL — ABNORMAL HIGH (ref 4.0–10.5)
nRBC: 0 % (ref 0.0–0.2)

## 2021-10-28 LAB — SURGICAL PCR SCREEN
MRSA, PCR: NEGATIVE
Staphylococcus aureus: NEGATIVE

## 2021-10-28 SURGERY — REPAIR, HERNIA, INGUINAL, ADULT
Anesthesia: General | Site: Inguinal | Laterality: Left

## 2021-10-28 MED ORDER — SODIUM CHLORIDE 0.9 % IR SOLN
Status: DC | PRN
  Administered 2021-10-28: 1000 mL

## 2021-10-28 MED ORDER — FENTANYL CITRATE (PF) 250 MCG/5ML IJ SOLN
INTRAMUSCULAR | Status: AC
  Filled 2021-10-28: qty 5

## 2021-10-28 MED ORDER — CHLORHEXIDINE GLUCONATE CLOTH 2 % EX PADS: 6.0000 | MEDICATED_PAD | Freq: Once | CUTANEOUS | Status: DC

## 2021-10-28 MED ORDER — CHLORHEXIDINE GLUCONATE CLOTH 2 % EX PADS
6.0000 | MEDICATED_PAD | Freq: Once | CUTANEOUS | Status: AC
  Administered 2021-10-28: 6 via TOPICAL

## 2021-10-28 MED ORDER — HYDROMORPHONE HCL 1 MG/ML IJ SOLN
0.2500 mg | INTRAMUSCULAR | Status: DC | PRN
  Administered 2021-10-28 (×3): 0.5 mg via INTRAVENOUS
  Filled 2021-10-28: qty 0.5

## 2021-10-28 MED ORDER — PROPOFOL 10 MG/ML IV BOLUS
INTRAVENOUS | Status: DC | PRN
  Administered 2021-10-28: 120 mg via INTRAVENOUS

## 2021-10-28 MED ORDER — BUPIVACAINE HCL (300 MG DOSE) 3 X 100 MG IL IMPL
DRUG_IMPLANT | Status: AC
  Filled 2021-10-28: qty 100

## 2021-10-28 MED ORDER — SIMETHICONE 80 MG PO CHEW
40.0000 mg | CHEWABLE_TABLET | Freq: Four times a day (QID) | ORAL | Status: DC | PRN
Start: 2021-10-28 — End: 2021-10-29

## 2021-10-28 MED ORDER — CHLORHEXIDINE GLUCONATE 0.12 % MT SOLN
15.0000 mL | Freq: Once | OROMUCOSAL | Status: AC
  Administered 2021-10-28: 15 mL via OROMUCOSAL

## 2021-10-28 MED ORDER — BUPIVACAINE HCL (300 MG DOSE) 3 X 100 MG IL IMPL
DRUG_IMPLANT | Status: DC | PRN
  Administered 2021-10-28: 300 mg

## 2021-10-28 MED ORDER — LIDOCAINE HCL (CARDIAC) PF 50 MG/5ML IV SOSY
PREFILLED_SYRINGE | INTRAVENOUS | Status: DC | PRN
  Administered 2021-10-28: 80 mg via INTRAVENOUS

## 2021-10-28 MED ORDER — LACTATED RINGERS IV SOLN: INTRAVENOUS | Status: DC

## 2021-10-28 MED ORDER — ORAL CARE MOUTH RINSE: 15.0000 mL | Freq: Once | OROMUCOSAL | Status: AC

## 2021-10-28 MED ORDER — FENTANYL CITRATE (PF) 100 MCG/2ML IJ SOLN
INTRAMUSCULAR | Status: DC | PRN
  Administered 2021-10-28: 50 ug via INTRAVENOUS
  Administered 2021-10-28: 100 ug via INTRAVENOUS
  Administered 2021-10-28 (×2): 50 ug via INTRAVENOUS

## 2021-10-28 MED ORDER — ONDANSETRON HCL 4 MG/2ML IJ SOLN
INTRAMUSCULAR | Status: AC
  Filled 2021-10-28: qty 2

## 2021-10-28 MED ORDER — PROPOFOL 10 MG/ML IV BOLUS
INTRAVENOUS | Status: AC
  Filled 2021-10-28: qty 20

## 2021-10-28 MED ORDER — EPHEDRINE SULFATE (PRESSORS) 50 MG/ML IJ SOLN
INTRAMUSCULAR | Status: DC | PRN
  Administered 2021-10-28 (×2): 10 mg via INTRAVENOUS

## 2021-10-28 MED ORDER — ROCURONIUM BROMIDE 10 MG/ML (PF) SYRINGE
PREFILLED_SYRINGE | INTRAVENOUS | Status: AC
  Filled 2021-10-28: qty 10

## 2021-10-28 MED ORDER — SODIUM CHLORIDE 0.9 % IV SOLN: INTRAVENOUS | Status: DC

## 2021-10-28 MED ORDER — ROCURONIUM 10MG/ML (10ML) SYRINGE FOR MEDFUSION PUMP - OPTIME
INTRAVENOUS | Status: DC | PRN
  Administered 2021-10-28: 40 mg via INTRAVENOUS

## 2021-10-28 MED ORDER — ONDANSETRON HCL 4 MG/2ML IJ SOLN
INTRAMUSCULAR | Status: DC | PRN
  Administered 2021-10-28: 4 mg via INTRAVENOUS

## 2021-10-28 MED ORDER — LIDOCAINE HCL (PF) 2 % IJ SOLN
INTRAMUSCULAR | Status: AC
  Filled 2021-10-28: qty 5

## 2021-10-28 SURGICAL SUPPLY — 34 items
ADH SKN CLS APL DERMABOND .7 (GAUZE/BANDAGES/DRESSINGS) ×1
CLOTH BEACON ORANGE TIMEOUT ST (SAFETY) ×2 IMPLANT
COVER LIGHT HANDLE STERIS (MISCELLANEOUS) ×4 IMPLANT
DERMABOND ADVANCED (GAUZE/BANDAGES/DRESSINGS) ×1
DERMABOND ADVANCED .7 DNX12 (GAUZE/BANDAGES/DRESSINGS) ×1 IMPLANT
DRAIN PENROSE 0.5X18 (DRAIN) ×2 IMPLANT
ELECT REM PT RETURN 9FT ADLT (ELECTROSURGICAL) ×2
ELECTRODE REM PT RTRN 9FT ADLT (ELECTROSURGICAL) ×1 IMPLANT
GAUZE 4X4 16PLY ~~LOC~~+RFID DBL (SPONGE) ×2 IMPLANT
GAUZE SPONGE 4X4 12PLY STRL (GAUZE/BANDAGES/DRESSINGS) ×2 IMPLANT
GLOVE BIO SURGEON STRL SZ 6.5 (GLOVE) ×1 IMPLANT
GLOVE BIOGEL PI IND STRL 7.0 (GLOVE) IMPLANT
GLOVE BIOGEL PI INDICATOR 7.0 (GLOVE) ×1
GLOVE ECLIPSE 6.5 STRL STRAW (GLOVE) ×1 IMPLANT
GLOVE SURG SS PI 7.5 STRL IVOR (GLOVE) ×1 IMPLANT
GOWN STRL REUS W/TWL LRG LVL3 (GOWN DISPOSABLE) ×6 IMPLANT
INST SET MINOR GENERAL (KITS) ×2 IMPLANT
KIT TURNOVER KIT A (KITS) ×2 IMPLANT
LIGASURE IMPACT 36 18CM CVD LR (INSTRUMENTS) ×1 IMPLANT
MANIFOLD NEPTUNE II (INSTRUMENTS) ×2 IMPLANT
MESH HERNIA 1.6X1.9 PLUG LRG (Mesh General) IMPLANT
MESH HERNIA PLUG LRG (Mesh General) ×1 IMPLANT
MESH MARLEX PLUG MEDIUM (Mesh General) ×1 IMPLANT
NS IRRIG 1000ML POUR BTL (IV SOLUTION) ×2 IMPLANT
PACK MINOR (CUSTOM PROCEDURE TRAY) ×2 IMPLANT
PAD ARMBOARD 7.5X6 YLW CONV (MISCELLANEOUS) ×2 IMPLANT
SET BASIN LINEN APH (SET/KITS/TRAYS/PACK) ×2 IMPLANT
SOL PREP PROV IODINE SCRUB 4OZ (MISCELLANEOUS) ×2 IMPLANT
SUT MNCRL AB 4-0 PS2 18 (SUTURE) ×2 IMPLANT
SUT NOVA NAB GS-22 2 2-0 T-19 (SUTURE) ×5 IMPLANT
SUT VIC AB 2-0 CT1 27 (SUTURE) ×2
SUT VIC AB 2-0 CT1 TAPERPNT 27 (SUTURE) ×1 IMPLANT
SUT VIC AB 3-0 SH 27 (SUTURE) ×2
SUT VIC AB 3-0 SH 27X BRD (SUTURE) ×1 IMPLANT

## 2021-10-28 NOTE — Anesthesia Postprocedure Evaluation (Signed)
Anesthesia Post Note ? ?Patient: Dustin Dominguez ? ?Procedure(s) Performed: HERNIA REPAIR INGUINAL ADULT WITH MESH (Left: Inguinal) ? ?Patient location during evaluation: PACU ?Anesthesia Type: General ?Level of consciousness: awake and alert and oriented ?Pain management: pain level controlled ?Vital Signs Assessment: post-procedure vital signs reviewed and stable ?Respiratory status: spontaneous breathing, nonlabored ventilation and respiratory function stable ?Cardiovascular status: blood pressure returned to baseline and stable ?Postop Assessment: no apparent nausea or vomiting ?Anesthetic complications: no ? ? ?No notable events documented. ? ? ?Last Vitals:  ?Vitals:  ? 10/28/21 1230 10/28/21 1241  ?BP: (!) 162/69   ?Pulse: 88   ?Resp: 17   ?Temp:    ?SpO2: 92% 92%  ?  ?Last Pain:  ?Vitals:  ? 10/28/21 1241  ?TempSrc:   ?PainSc: 8   ? ? ?  ?  ?  ?  ?  ?  ? ?Dianna Ewald C Chante Mayson ? ? ? ? ?

## 2021-10-28 NOTE — Interval H&P Note (Signed)
History and Physical Interval Note: ? ?10/28/2021 ?10:09 AM ? ?Dustin Dominguez  has presented today for surgery, with the diagnosis of left inguinal hernia.  The various methods of treatment have been discussed with the patient and family. After consideration of risks, benefits and other options for treatment, the patient has consented to  Procedure(s): ?HERNIA REPAIR INGUINAL ADULT WITH MESH (Left) as a surgical intervention.  The patient's history has been reviewed, patient examined, no change in status, stable for surgery.  I have reviewed the patient's chart and labs.  Questions were answered to the patient's satisfaction.   ? ? ?Aviva Signs ? ? ?
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
## 2021-10-28 NOTE — Progress Notes (Signed)
Patients Blood pressure 120/55 and pulse 73, scheduled Metoprolol tartrate '25mg'$ , Messaged MD who informed this nurse to hold medication at this time. Will continue to monitor patient. Also verbal from MD to give sip of water when giving Pain medication, Patient NPO for inguinal hernia surgery in AM.  ?

## 2021-10-28 NOTE — Transfer of Care (Signed)
Immediate Anesthesia Transfer of Care Note ? ?Patient: Dustin Dominguez ? ?Procedure(s) Performed: HERNIA REPAIR INGUINAL ADULT WITH MESH (Left: Inguinal) ? ?Patient Location: PACU ? ?Anesthesia Type:General ? ?Level of Consciousness: awake ? ?Airway & Oxygen Therapy: Patient Spontanous Breathing ? ?Post-op Assessment: Report given to RN ? ?Post vital signs: Reviewed ? ?Last Vitals:  ?Vitals Value Taken Time  ?BP 171/84 10/28/21 1215  ?Temp    ?Pulse 83 10/28/21 1219  ?Resp 16 10/28/21 1219  ?SpO2 93 % 10/28/21 1219  ?Vitals shown include unvalidated device data. ? ?Last Pain:  ?Vitals:  ? 10/28/21 0853  ?TempSrc: Oral  ?PainSc: 5   ?   ? ?  ? ?Complications: No notable events documented. ?

## 2021-10-28 NOTE — Progress Notes (Signed)
?PROGRESS NOTE ? ? ?Dustin Dominguez  YSA:[id number] DOB: May 23, 1941 DOA: 10/25/2021 ?PCP: [name], FNP  ? ?Chief Complaint  ?Patient presents with  ? Groin Swelling  ? ?Level of care: Telemetry ? ?Brief Admission History:  ?Dustin Dominguez  is a 81 y.o. male with past medical history relevant for chronic A-fib on Eliquis for anticoagulation, PAD, CAD, status post prior CABG, diastolic dysfunction CHF, DM, HTN and GERD-admitted on 10/26/2021 with incarcerated left inguinal hernia and scrotal cellulitis ?Assessment and Plan: ?1)Inguinal Hernia-- Large left inguinal hernia containing fat and free fluid ?Gen surgery consult appreciated ?-POD#0 s/p surgical repair with Mesh on 10/28/21 allowing time for Eliquis washout ?-Last dose of Eliquis was 10/25/2021, likely can resume on 4/13 if ok with surgery team.  ?  ?2)HFpEF--Echo with EF of 50 to 55% with grade 1 diastolic dysfunction ?-Elevated BNP and chest x-ray consistent with CHF ?-Patient presenting with acute on chronic diastolic dysfunction CHF exacerbation with increasing lower extremity and scrotal edema ?-IV Lasix as ordered ?-Daily weights and fluid input and output monitoring advised ?  ?3)Scrotal cellulitis--patient is penicillin allergic, treat empirically with vancomycin and cefepime ?-Leukocytosis patient ?-continue cefepime, If scrotal findings continues to improve, anticipating DC home 4/13 ?  ?4)Paroxysmal A-fib/flutter--- hold Eliquis to allow for hernia repair, likely can resume 4/13 if ok with surgery  ?  ?5)CAD--prior history of angioplasty and stenting/prior CABG--- PTA patient was on lisinopril, Norvasc, metoprolol isosorbide and atorvastatin ? ?DVT prophylaxis: SCDs ?Code Status: Full  ?Family Communication: bedside updated 4/12  ?Disposition: Status is: Inpatient ?Remains inpatient appropriate because: immediate postop from OR  ?  ?Consultants:  ?Surgery  ?Procedures:  ? Left inguinal herniorrhaphy with mesh 10/28/21 ?Antimicrobials:  ?N/a   ?Subjective: ?Pt seen in postop period, he is eating supper.  No complaints at this time.   ?Objective: ?Vitals:  ? 10/28/21 1245 10/28/21 1300 10/28/21 1326 10/28/21 1631  ?BP: (!) 162/76 (!) 154/72 (!) 155/68 (!) 120/49  ?Pulse: 82 81 84 85  ?Resp: '17 15  18  '$ ?Temp:    97.8 ?F (36.6 ?C)  ?TempSrc:    Oral  ?SpO2:  90% 92% 91%  ?Weight:      ?Height:      ? ? ?Intake/Output Summary (Last 24 hours) at 10/28/2021 1706 ?Last data filed at 10/28/2021 1519 ?Gross per 24 hour  ?Intake 687 ml  ?Output 360 ml  ?Net 327 ml  ? ?Filed Weights  ? 10/25/21 1631 10/28/21 0853  ?Weight: 86.2 kg 86.2 kg  ? ?Examination: ? ?General exam: Appears calm and comfortable  ?Respiratory system: Clear to auscultation. Respiratory effort normal. ?Cardiovascular system: normal S1 & S2 heard. No JVD, murmurs, rubs, gallops or clicks. No pedal edema. ?Gastrointestinal system: wounds clean and dry.  Abdomen is nondistended, soft and nontender. No organomegaly or masses felt. Normal bowel sounds heard. ?Central nervous system: Alert and oriented. No focal neurological deficits. ?Extremities: Symmetric 5 x 5 power. ?Skin: No rashes, lesions or ulcers. ?Psychiatry: Judgement and insight appear normal. Mood & affect appropriate.  ? ?Data Reviewed: I have personally reviewed following labs and imaging studies ? ?CBC: ?Recent Labs  ?Lab 10/25/21 ?1736 10/27/21 ?3235 10/28/21 ?5732  ?WBC 15.2* 15.1* 17.7*  ?HGB 11.1* 10.3* 10.5*  ?HCT 34.9* 32.1* 33.1*  ?MCV 92.3 90.4 90.9  ?PLT 493* 473* 485*  ? ? ?Basic Metabolic Panel: ?Recent Labs  ?Lab 10/25/21 ?1736 10/27/21 ?2025  ?NA 142 139  ?K 4.4 3.8  ?CL 112* 102  ?CO2 24  30  ?GLUCOSE 127* 94  ?BUN 17 17  ?CREATININE 0.90 1.09  ?CALCIUM 7.7* 8.0*  ? ? ?CBG: ?No results for input(s): GLUCAP in the last 168 hours. ? ?Recent Results (from the past 240 hour(s))  ?Surgical pcr screen     Status: None  ? Collection Time: 10/28/21  4:14 AM  ? Specimen: Nasal Mucosa; Nasal Swab  ?Result Value Ref Range Status  ?  MRSA, PCR NEGATIVE NEGATIVE Final  ? Staphylococcus aureus NEGATIVE NEGATIVE Final  ?  Comment: (NOTE) ?The Xpert SA Assay (FDA approved for NASAL specimens in patients 80 ?years of age and older), is one component of a comprehensive ?surveillance program. It is not intended to diagnose infection nor to ?guide or monitor treatment. ?Performed at [facility], [address], [place], [place] [address] ?  ?  ? ?Radiology Studies: ?No results found. ? ?Scheduled Meds: ? amLODipine  5 mg Oral Daily  ? atorvastatin  10 mg Oral q1800  ? fenofibrate  160 mg Oral Daily  ? furosemide  40 mg Intravenous Q12H  ? isosorbide mononitrate  30 mg Oral Daily  ? lactulose  10 g Oral BID  ? lisinopril  5 mg Oral Daily  ? metoprolol tartrate  25 mg Oral BID  ? morphine  30 mg Oral Q8H  ? oxyCODONE-acetaminophen  1 tablet Oral Q4H  ? And  ? oxyCODONE  5 mg Oral Q4H  ? pantoprazole  40 mg Oral Daily  ? sodium chloride flush  3 mL Intravenous Q12H  ? sodium chloride flush  3 mL Intravenous Q12H  ? vitamin B-12  1,000 mcg Oral Daily  ? ?Continuous Infusions: ? sodium chloride Stopped (10/26/21 2310)  ? sodium chloride 50 mL/hr at 10/28/21 1519  ? vancomycin Stopped (10/27/21 2334)  ? ? ? LOS: 2 days  ? ?Time spent: 35 mins ? ?Irwin Brakeman, MD ?How to contact the [facility] Attending or Consulting provider [place] or covering provider during after hours [place], for this patient?  ?Check the care team in [facility] and look for a) attending/consulting [facility] provider listed and b) the [facility] team listed ?Log into [email or web address] and use 's universal password to access. If you do not have the password, please contact the hospital operator. ?Locate the [facility] provider you are looking for under [facility] and page to a number that you can be directly reached. ?If you still have difficulty reaching the provider, please page the [facility] (Director on Call) for the Hospitalists listed on amion for assistance. ? ?10/28/2021, 5:06 PM  ? ? ?

## 2021-10-28 NOTE — Op Note (Signed)
Patient:  Dustin Dominguez ? ?DOB:  [birth year] ? ?MRN:  [id number] ? ? ?Preop Diagnosis: Left inguinal hernia ? ?Postop Diagnosis: Same ? ?Procedure: Left inguinal herniorrhaphy with mesh ? ?Surgeon: Aviva Signs, MD ? ?Anes: General ? ?Indications: Patient is a 81 year old white male with multiple medical problems including congestive heart failure and chronic anticoagulation for atrial fibrillation who presents with scrotal swelling and a large left inguinal hernia that is causing him to be symptomatic.  Patient now presents for left inguinal herniorrhaphy with mesh.  The risks and benefits of the procedure including bleeding, infection, mesh use, and the possibility of recurrence of the hernia were fully explained to the patient, who gave informed consent. ? ?Procedure note: The patient was placed in the supine position.  After induction of general endotracheal anesthesia, the left groin region was prepped and draped using the usual sterile technique with Betadine.  Surgical site confirmation was performed. ? ?Incision was made in the left groin region down to the external oblique aponeurosis.  The aponeurosis was incised to the external ring.  A Penrose drain was placed around the spermatic cord.  The vas deferens was noted within the spermatic cord.  The patient had a large lipoma of the cord.  This was excised using the LigaSure up to the peritoneal reflection.  An indirect hernia sac was found.  This was freed away from the spermatic cord up to the peritoneal reflection and inverted.  A large Bard PerFix plug was then placed.  The patient also had a small direct hernia just lateral to the pubic tubercle.  This was reduced and a medium size Bard PerFix plug was placed.  An onlay patch was placed along the floor of the inguinal canal and secured superiorly to the conjoined tendon and inferiorly to the shelving edge of Poupart's ligament using 2-0 Novafil interrupted sutures.  The internal ring was recreated using  a 2-0 Novafil interrupted suture.  Robynn Pane was placed over the mesh and in the subcutaneous tissue.  The external oblique aponeurosis was reapproximated using a 2-0 Vicryl running suture.  The subcutaneous layer was reapproximated using 3-0 Vicryl interrupted sutures.  The skin was closed using a 4-0 Monocryl subcuticular suture.  Dermabond was applied. ? ?All tape and needle counts were correct at the end of the procedure.  The patient was awakened and transferred to PACU in stable condition. ? ?Complications: None ? ?EBL: Minimal ? ?Specimen: None ? ? ?  ?

## 2021-10-28 NOTE — Anesthesia Procedure Notes (Addendum)
Procedure Name: Intubation ?Date/Time: 10/28/2021 10:51 AM ?Performed by: Ollen Bowl, CRNA ?Pre-anesthesia Checklist: Patient identified, Patient being monitored, Timeout performed, Emergency Drugs available and Suction available ?Patient Re-evaluated:Patient Re-evaluated prior to induction ?Oxygen Delivery Method: Circle System Utilized ?Preoxygenation: Pre-oxygenation with 100% oxygen ?Induction Type: IV induction ?Ventilation: Mask ventilation without difficulty ?Laryngoscope Size: Mac and 3 ?Grade View: Grade II ?Tube type: Oral ?Tube size: 7.5 mm ?Number of attempts: 1 ?Airway Equipment and Method: stylet ?Placement Confirmation: ETT inserted through vocal cords under direct vision, positive ETCO2 and breath sounds checked- equal and bilateral ?Secured at: 23 cm ?Tube secured with: Tape ?Dental Injury: Teeth and Oropharynx as per pre-operative assessment  ? ? ? ? ?

## 2021-10-29 ENCOUNTER — Encounter (HOSPITAL_COMMUNITY): Payer: Self-pay | Admitting: General Surgery

## 2021-10-29 DIAGNOSIS — K4090 Unilateral inguinal hernia, without obstruction or gangrene, not specified as recurrent: Secondary | ICD-10-CM

## 2021-10-29 DIAGNOSIS — N492 Inflammatory disorders of scrotum: Secondary | ICD-10-CM | POA: Diagnosis not present

## 2021-10-29 DIAGNOSIS — N5089 Other specified disorders of the male genital organs: Secondary | ICD-10-CM | POA: Diagnosis not present

## 2021-10-29 DIAGNOSIS — I480 Paroxysmal atrial fibrillation: Secondary | ICD-10-CM | POA: Diagnosis not present

## 2021-10-29 DIAGNOSIS — I10 Essential (primary) hypertension: Secondary | ICD-10-CM | POA: Diagnosis not present

## 2021-10-29 LAB — CBC
HCT: 31.8 % — ABNORMAL LOW (ref 39.0–52.0)
Hemoglobin: 10.5 g/dL — ABNORMAL LOW (ref 13.0–17.0)
MCH: 30.2 pg (ref 26.0–34.0)
MCHC: 33 g/dL (ref 30.0–36.0)
MCV: 91.4 fL (ref 80.0–100.0)
Platelets: 475 10*3/uL — ABNORMAL HIGH (ref 150–400)
RBC: 3.48 MIL/uL — ABNORMAL LOW (ref 4.22–5.81)
RDW: 16.5 % — ABNORMAL HIGH (ref 11.5–15.5)
WBC: 19.1 10*3/uL — ABNORMAL HIGH (ref 4.0–10.5)
nRBC: 0.1 % (ref 0.0–0.2)

## 2021-10-29 LAB — BASIC METABOLIC PANEL
Anion gap: 7 (ref 5–15)
BUN: 23 mg/dL (ref 8–23)
CO2: 30 mmol/L (ref 22–32)
Calcium: 7.5 mg/dL — ABNORMAL LOW (ref 8.9–10.3)
Chloride: 102 mmol/L (ref 98–111)
Creatinine, Ser: 1.19 mg/dL (ref 0.61–1.24)
GFR, Estimated: >60 mL/min (ref >60)
Glucose, Bld: 107 mg/dL — ABNORMAL HIGH (ref 70–99)
Potassium: 3.6 mmol/L (ref 3.5–5.1)
Sodium: 139 mmol/L (ref 135–145)

## 2021-10-29 MED ORDER — LACTULOSE 10 GM/15ML PO SOLN: 10.0000 g | Freq: Two times a day (BID) | ORAL | 1 refills | Status: DC

## 2021-10-29 MED ORDER — APIXABAN 5 MG PO TABS
5.0000 mg | ORAL_TABLET | Freq: Two times a day (BID) | ORAL | Status: DC
  Administered 2021-10-29: 5 mg via ORAL
  Filled 2021-10-29: qty 1

## 2021-10-29 MED ORDER — POTASSIUM CHLORIDE CRYS ER 10 MEQ PO TBCR: 10.0000 meq | EXTENDED_RELEASE_TABLET | ORAL | 1 refills | Status: DC

## 2021-10-29 MED ORDER — FUROSEMIDE 20 MG PO TABS: 20.0000 mg | ORAL_TABLET | Freq: Every day | ORAL | 3 refills | Status: DC

## 2021-10-29 MED ORDER — SULFAMETHOXAZOLE-TRIMETHOPRIM 800-160 MG PO TABS: 1.0000 | ORAL_TABLET | Freq: Two times a day (BID) | ORAL | 0 refills | Status: AC

## 2021-10-29 NOTE — Progress Notes (Signed)
1 Day Post-Op  ?Subjective: ?Patient has minimal incisional pain. ? ?Objective: ?Vital signs in last 24 hours: ?Temp:  [97.7 ?F (36.5 ?C)-98.3 ?F (36.8 ?C)] 98.3 ?F (36.8 ?C) (04/13 0429) ?Pulse Rate:  [71-88] 71 (04/13 0429) ?Resp:  [15-22] 19 (04/13 0429) ?BP: (120-171)/(49-84) 125/58 (04/13 0429) ?SpO2:  [90 %-96 %] 93 % (04/13 0429) ?Last BM Date : 10/26/21 ? ?Intake/Output from previous day: ?04/12 0701 - 04/13 0700 ?In: 1318.8 [I.V.:740.6; IV Piggyback:578.2] ?Out: 810 [Urine:800; Blood:10] ?Intake/Output this shift: ?Total I/O ?In: 480 [P.O.:480] ?Out: -  ? ?General appearance: alert, cooperative, and no distress ?GI: Soft, incision healing well. ? ?Lab Results:  ?Recent Labs  ?  10/28/21 ?8466 10/29/21 ?5993  ?WBC 17.7* 19.1*  ?HGB 10.5* 10.5*  ?HCT 33.1* 31.8*  ?PLT 485* 475*  ? ?BMET ?Recent Labs  ?  10/27/21 ?5701 10/29/21 ?7793  ?NA 139 139  ?K 3.8 3.6  ?CL 102 102  ?CO2 30 30  ?GLUCOSE 94 107*  ?BUN 17 23  ?CREATININE 1.09 1.19  ?CALCIUM 8.0* 7.5*  ? ?PT/INR ?No results for input(s): LABPROT, INR in the last 72 hours. ? ?Studies/Results: ?No results found. ? ?Anti-infectives: ?Anti-infectives (From admission, onward)  ? ? Start     Dose/Rate Route Frequency Ordered Stop  ? 10/27/21 1000  vancomycin (VANCOCIN) IVPB 1000 mg/200 mL premix       ? 1,000 mg ?200 mL/hr over 60 Minutes Intravenous Every 12 hours 10/26/21 1950 11/06/21 0959  ? 10/26/21 2045  ceFEPIme (MAXIPIME) 2 g in sodium chloride 0.9 % 100 mL IVPB  Status:  Discontinued       ? 2 g ?200 mL/hr over 30 Minutes Intravenous Every 8 hours 10/26/21 1950 10/28/21 1329  ? 10/26/21 1945  vancomycin (VANCOCIN) IVPB 1000 mg/200 mL premix       ? 1,000 mg ?200 mL/hr over 60 Minutes Intravenous  Once 10/26/21 1853 10/26/21 2120  ? 10/26/21 1945  meropenem (MERREM) 1 g in sodium chloride 0.9 % 100 mL IVPB  Status:  Discontinued       ? 1 g ?200 mL/hr over 30 Minutes Intravenous  Once 10/26/21 1853 10/26/21 1854  ? 10/26/21 1945  metroNIDAZOLE (FLAGYL)  IVPB 500 mg  Status:  Discontinued       ? 500 mg ?100 mL/hr over 60 Minutes Intravenous Every 12 hours 10/26/21 1854 10/28/21 1329  ? ?  ? ? ?Assessment/Plan: ?s/p Procedure(s): ?HERNIA REPAIR INGUINAL ADULT WITH MESH ?Impression: Stable on postoperative day 1, status post left inguinal herniorrhaphy with mesh.  Still with persistent leukocytosis of unknown etiology. ?Plan: Discussed with Dr. Wynetta Emery.  May restart anticoagulation.  Would send patient home on 5 days of Bactrim as mesh was placed.  Okay for discharge from surgery standpoint. ? LOS: 3 days  ? ? ?Aviva Signs ?10/29/2021  ?

## 2021-10-29 NOTE — Discharge Summary (Signed)
Physician Discharge Summary  ?Dustin Dominguez QMV:[id number] DOB: 1941/04/16 DOA: 10/25/2021 ? ?PCP: [name], FNP ?Surgeon: Dr. Aviva Signs ? ?Admit date: 10/25/2021 ?Discharge date: 10/29/2021 ? ?Admitted From:  Home  ?Disposition:  Home  ? ?Recommendations for Outpatient Follow-up:  ?Follow up with PCP in 1 weeks ?Follow up with surgery in 1-2 weeks  ?Please check CBC in 1-2 weeks to follow up  ? ?Discharge Condition: STABLE   ?CODE STATUS: FULL  ?DIET: resume heart healthy   ? ?Brief Hospitalization Summary: ?Please see all hospital notes, images, labs for full details of the hospitalization. ?Brief Admission History:  ?Dustin Dominguez  is a 81 y.o. male with past medical history relevant for chronic A-fib on Eliquis for anticoagulation, PAD, CAD, status post prior CABG, diastolic dysfunction CHF, DM, HTN and GERD-admitted on 10/26/2021 with incarcerated left inguinal hernia and scrotal cellulitis ? ?Assessment and Plan: ?1)Inguinal Hernia-- Large left inguinal hernia containing fat and free fluid ?Gen surgery consult appreciated ?-POD#1 s/p surgical repair with Mesh on 10/28/21 allowing time for Eliquis washout ?-per surgery, apixaban can resume on 4/13.   ?  ?2)HFpEF--Echo with EF of 50 to 55% with grade 1 diastolic dysfunction ?-Elevated BNP and chest x-ray consistent with CHF ?-Patient presenting with acute on chronic diastolic dysfunction CHF exacerbation with increasing lower extremity and scrotal edema ?-IV Lasix was given with excellent response.  ?-DC on lasix 20 mg daily with potassium supplementation. ?  ?3)Scrotal cellulitis--patient is penicillin allergic, treat empirically with vancomycin and cefepime ?-Leukocytosis patient ?-continue cefepime, If scrotal findings continues to improve, anticipating DC home 4/13.  Discussed with Dr. Arnoldo Morale, recommended DC on 5 day course of bactrim DS ?  ?4)Paroxysmal A-fib/flutter--- hold Eliquis to allow for hernia repair, likely can resume 4/13 if ok with  surgery  ?  ?5)CAD--prior history of angioplasty and stenting/prior CABG--- PTA patient was on lisinopril, Norvasc, metoprolol isosorbide and atorvastatin ? ?6) Leukocytosis - mild postop bump, discussed with surgery team, DC home on oral bactrim DS x 5 days, recheck CBC in 1 week. Pt denies fever or chills.  Follow up outpatient.  ?  ?DVT prophylaxis: SCDs ?Code Status: Full  ?Family Communication: bedside updated 4/12  ?  ?Consultants:  ?Surgery  ?Procedures:  ? Left inguinal herniorrhaphy with mesh 10/28/21 ?Discharge Diagnoses:  ?Principal Problem: ?  Scrotal edema ?Active Problems: ?  Essential hypertension, benign ?  Claudication in peripheral vascular disease ([place]) ?  AF (paroxysmal atrial fibrillation) ([place]) ?  Left inguinal hernia ?  Cellulitis of scrotum ? ? ?Discharge Instructions: ? ?Allergies as of 10/29/2021   ? ?   Reactions  ? Amitriptyline Other (See Comments)  ? sleepy  ? Bextra [valdecoxib] Other (See Comments)  ? unknown  ? Codeine Nausea And Vomiting  ? Cymbalta [duloxetine Hcl] Swelling, Other (See Comments)  ? dizzy  ? Esomeprazole Magnesium Diarrhea  ? Niacin-lovastatin Er Other (See Comments)  ? headache  ? Niaspan [niacin Er] Other (See Comments)  ? Increased headache  ? Penicillins Rash  ? Has patient had a PCN reaction causing immediate rash, facial/tongue/throat swelling, SOB or lightheadedness with hypotension: Yes ?Has patient had a PCN reaction causing severe rash involving mucus membranes or skin necrosis: No ?Has patient had a PCN reaction that required hospitalization No ?Has patient had a PCN reaction occurring within the last 10 years: No ?Tolerated Zosyn 06/2016  ? ?  ? ?  ?Medication List  ?  ? ?STOP taking these medications   ? ?feeding  supplement Liqd ?  ? ?  ? ?TAKE these medications   ? ?amLODipine 5 MG tablet ?Commonly known as: NORVASC ?TAKE 1 TABLET BY MOUTH DAILY ?  ?atorvastatin 10 MG tablet ?Commonly known as: LIPITOR ?Take 1 tablet (10 mg total) by mouth daily. ?   ?Eliquis 5 MG Tabs tablet ?Generic drug: apixaban ?TAKE 1 TABLET BY MOUTH TWICE DAILY ?What changed: how much to take ?  ?fenofibrate 160 MG tablet ?TAKE 1 TABLET BY MOUTH DAILY ?  ?furosemide 20 MG tablet ?Commonly known as: LASIX ?Take 1 tablet (20 mg total) by mouth daily. ?Start taking on: October 30, 2021 ?What changed:  ?when to take this ?reasons to take this ?  ?isosorbide mononitrate 30 MG 24 hr tablet ?Commonly known as: IMDUR ?TAKE 1 TABLET BY MOUTH DAILY ?  ?lactulose 10 GM/15ML solution ?Commonly known as: Constulose ?Take 15 mLs (10 g total) by mouth 2 (two) times daily. TAKE ONE TABLESPOONFUL (15ML) BY MOUTH DAILY AS NEEDED FOR MILD CONSTIPATION, MODERATE CONSTIPATION OR SEVERE CONSTIPATION (NEEDS TO BE SEEN BEFORE NEXT REFILL) ?What changed:  ?how much to take ?how to take this ?when to take this ?  ?lisinopril 5 MG tablet ?Commonly known as: ZESTRIL ?TAKE 1 TABLET BY MOUTH DAILY ?  ?metoprolol tartrate 25 MG tablet ?Commonly known as: LOPRESSOR ?TAKE 1 TABLET BY MOUTH TWICE DAILY ?  ?morphine 30 MG 12 hr tablet ?Commonly known as: MS CONTIN ?Take 30 mg by mouth 3 (three) times daily. ?  ?omeprazole 40 MG capsule ?Commonly known as: PRILOSEC ?Take 1 capsule (40 mg total) by mouth 2 (two) times daily. ?  ?oxyCODONE-acetaminophen 10-325 MG tablet ?Commonly known as: PERCOCET ?Take 1 tablet by mouth 6 (six) times daily. ?  ?potassium chloride 10 MEQ tablet ?Commonly known as: KLOR-CON M ?Take 1 tablet (10 mEq total) by mouth every other day. ?Start taking on: October 30, 2021 ?  ?sulfamethoxazole-trimethoprim 800-160 MG tablet ?Commonly known as: BACTRIM DS ?Take 1 tablet by mouth 2 (two) times daily for 5 days. ?  ?Ventolin HFA 108 (90 Base) MCG/ACT inhaler ?Generic drug: albuterol ?INHALE TWO PUFFS INTO THE LUNGS EVERY 6 HOURS AS NEEDED FOR WHEEZING OR SHORTNESS OF BREATH ?What changed: See the new instructions. ?  ?vitamin B-12 1000 MCG tablet ?Commonly known as: CYANOCOBALAMIN ?Take 1,000 mcg by mouth  daily. ?  ? ?  ? ? Follow-up Information   ? ? Aviva Signs, MD Follow up.   ?Specialty: General Surgery ?Why: As needed.  Will call you next week for follow up. ?Contact information: ?[address] ?[place] [id number] ?[phone] ? ? ?  ?  ? ? [name], FNP. Schedule an appointment as soon as possible for a visit in 1 week(s).   ?Specialty: Family Medicine ?Why: Hospital Follow Up ?Contact information: ?[address] ?[place] [address] ?[phone] ? ? ?  ?  ? ? Satira Sark, MD. Schedule an appointment as soon as possible for a visit in 1 month(s).   ?Specialty: Cardiology ?Why: Hospital Follow Up ?Contact information: ?[address] ?[place] [address] ?[phone] ? ? ?  ?  ? ?  ?  ? ?  ? ?Allergies  ?Allergen Reactions  ? Amitriptyline Other (See Comments)  ?  sleepy  ? Bextra [Valdecoxib] Other (See Comments)  ?  unknown  ? Codeine Nausea And Vomiting  ? Cymbalta [Duloxetine Hcl] Swelling and Other (See Comments)  ?  dizzy  ? Esomeprazole Magnesium Diarrhea  ?  Niacin-Lovastatin Er Other (See Comments)  ?  headache  ? Niaspan [Niacin Er] Other (See Comments)  ?  Increased headache  ? Penicillins Rash  ?  Has patient had a PCN reaction causing immediate rash, facial/tongue/throat swelling, SOB or lightheadedness with hypotension: Yes ?Has patient had a PCN reaction causing severe rash involving mucus membranes or skin necrosis: No ?Has patient had a PCN reaction that required hospitalization No ?Has patient had a PCN reaction occurring within the last 10 years: No ?Tolerated Zosyn 06/2016 ?  ? ?Allergies as of 10/29/2021   ? ?   Reactions  ? Amitriptyline Other (See Comments)  ? sleepy  ? Bextra [valdecoxib] Other (See Comments)  ? unknown  ? Codeine Nausea And Vomiting  ? Cymbalta [duloxetine Hcl] Swelling, Other (See Comments)  ? dizzy  ? Esomeprazole Magnesium Diarrhea  ? Niacin-lovastatin Er Other (See Comments)  ? headache  ? Niaspan [niacin Er] Other (See Comments)   ? Increased headache  ? Penicillins Rash  ? Has patient had a PCN reaction causing immediate rash, facial/tongue/throat swelling, SOB or lightheadedness with hypotension: Yes ?Has patient had a PCN reaction ca

## 2021-10-29 NOTE — Progress Notes (Signed)
Patient received discharge instructions with daughter at bedside, both expressed understanding of instructions. All belongings given to patient, IV and cardiac monitor removed, patient transported via staff in w/c to private vehicle in stable condition.  ?

## 2021-10-29 NOTE — Discharge Instructions (Signed)
IMPORTANT INFORMATION: PAY CLOSE ATTENTION   PHYSICIAN DISCHARGE INSTRUCTIONS  Follow with Primary care provider  Joyce, Britney F, FNP  and other consultants as instructed by your Hospitalist Physician  SEEK MEDICAL CARE OR RETURN TO EMERGENCY ROOM IF SYMPTOMS COME BACK, WORSEN OR NEW PROBLEM DEVELOPS   Please note: You were cared for by a hospitalist during your hospital stay. Every effort will be made to forward records to your primary care provider.  You can request that your primary care provider send for your hospital records if they have not received them.  Once you are discharged, your primary care physician will handle any further medical issues. Please note that NO REFILLS for any discharge medications will be authorized once you are discharged, as it is imperative that you return to your primary care physician (or establish a relationship with a primary care physician if you do not have one) for your post hospital discharge needs so that they can reassess your need for medications and monitor your lab values.  Please get a complete blood count and chemistry panel checked by your Primary MD at your next visit, and again as instructed by your Primary MD.  Get Medicines reviewed and adjusted: Please take all your medications with you for your next visit with your Primary MD  Laboratory/radiological data: Please request your Primary MD to go over all hospital tests and procedure/radiological results at the follow up, please ask your primary care provider to get all Hospital records sent to his/her office.  In some cases, they will be blood work, cultures and biopsy results pending at the time of your discharge. Please request that your primary care provider follow up on these results.  If you are diabetic, please bring your blood sugar readings with you to your follow up appointment with primary care.    Please call and make your follow up appointments as soon as possible.    Also  Note the following: If you experience worsening of your admission symptoms, develop shortness of breath, life threatening emergency, suicidal or homicidal thoughts you must seek medical attention immediately by calling 911 or calling your MD immediately  if symptoms less severe.  You must read complete instructions/literature along with all the possible adverse reactions/side effects for all the Medicines you take and that have been prescribed to you. Take any new Medicines after you have completely understood and accpet all the possible adverse reactions/side effects.   Do not drive when taking Pain medications or sleeping medications (Benzodiazepines)  Do not take more than prescribed Pain, Sleep and Anxiety Medications. It is not advisable to combine anxiety,sleep and pain medications without talking with your primary care practitioner  Special Instructions: If you have smoked or chewed Tobacco  in the last 2 yrs please stop smoking, stop any regular Alcohol  and or any Recreational drug use.  Wear Seat belts while driving.  Do not drive if taking any narcotic, mind altering or controlled substances or recreational drugs or alcohol.       

## 2021-10-30 ENCOUNTER — Telehealth: Payer: Self-pay

## 2021-10-30 NOTE — Telephone Encounter (Signed)
Transition Care Management Follow-up Telephone Call ?Date of discharge and from where: 10/29/21 Dustin Dominguez - scrotal edema ?How have you been since you were released from the hospital? Very sore, but doing okay ?Any questions or concerns? No ? ?Items Reviewed: ?Did the pt receive and understand the discharge instructions provided? Yes  ?Medications obtained and verified? Yes  ?Other? No  ?Any new allergies since your discharge? No  ?Dietary orders reviewed? Yes ?Do you have support at home? Yes  ? ?Home Care and Equipment/Supplies: ?Were home health services ordered? no ? ?Were any new equipment or medical supplies ordered?  No ? ?Functional Questionnaire: (I = Independent and D = Dependent) ?ADLs: I ? ?Bathing/Dressing- I ? ?Meal Prep- I ? ?Eating- I ? ?Maintaining continence- I ? ?Transferring/Ambulation- I ? ?Managing Meds- I ? ?Follow up appointments reviewed: ? ?PCP Hospital f/u appt confirmed? Yes  Scheduled to see Hendricks Limes on 11/05/21 @ 2. ?[facility] f/u appt confirmed? Yes  Scheduled to see GI  on 11/02/21 @ 9:15. ?Are transportation arrangements needed? No  ?If their condition worsens, is the pt aware to call PCP or go to the Emergency Dept.? Yes ?Was the patient provided with contact information for the PCP's office or ED? Yes ?Was to pt encouraged to call back with questions or concerns? Yes  ?

## 2021-11-02 ENCOUNTER — Encounter (INDEPENDENT_AMBULATORY_CARE_PROVIDER_SITE_OTHER): Payer: Self-pay | Admitting: Gastroenterology

## 2021-11-02 ENCOUNTER — Ambulatory Visit (INDEPENDENT_AMBULATORY_CARE_PROVIDER_SITE_OTHER): Payer: Medicare HMO | Admitting: Gastroenterology

## 2021-11-02 VITALS — BP 124/49 | HR 67 | Temp 97.8°F | Ht 71.0 in | Wt 185.7 lb

## 2021-11-02 DIAGNOSIS — K219 Gastro-esophageal reflux disease without esophagitis: Secondary | ICD-10-CM | POA: Diagnosis not present

## 2021-11-02 DIAGNOSIS — K769 Liver disease, unspecified: Secondary | ICD-10-CM

## 2021-11-02 DIAGNOSIS — R1013 Epigastric pain: Secondary | ICD-10-CM

## 2021-11-02 DIAGNOSIS — Z8711 Personal history of peptic ulcer disease: Secondary | ICD-10-CM | POA: Insufficient documentation

## 2021-11-02 MED ORDER — SUCRALFATE 1 GM/10ML PO SUSP: 1.0000 g | Freq: Three times a day (TID) | ORAL | 1 refills | Status: DC

## 2021-11-02 MED ORDER — ESOMEPRAZOLE MAGNESIUM 40 MG PO CPDR: 40.0000 mg | DELAYED_RELEASE_CAPSULE | Freq: Every day | ORAL | 1 refills | Status: DC

## 2021-11-02 NOTE — Patient Instructions (Signed)
I have sen NEXIUM '40MG'$  DAILY for you to take in place of protonix/prilosec. Please take this first thing in the morning, let me know if you have diarrhea from this. If symptoms worsen, please let me know as it may be beneficial for [place] to do an EGD for further evaluation as we discussed.  ?I have also sent carafate 1g liquid for you to take before meals and at bedtime ?We will schedule an MRI of your liver to make sure the lesion noted on most recent CT scan is nothing concerning ?Please let me know if you would like to update colonoscopy ? ?Follow up 3 months ?
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
## 2021-11-05 ENCOUNTER — Ambulatory Visit (INDEPENDENT_AMBULATORY_CARE_PROVIDER_SITE_OTHER): Payer: Medicare HMO | Admitting: Family Medicine

## 2021-11-05 ENCOUNTER — Ambulatory Visit (INDEPENDENT_AMBULATORY_CARE_PROVIDER_SITE_OTHER): Payer: Medicare HMO

## 2021-11-05 ENCOUNTER — Encounter: Payer: Self-pay | Admitting: Family Medicine

## 2021-11-05 VITALS — BP 118/54 | HR 67 | Temp 98.3°F | Ht 71.0 in | Wt 185.2 lb

## 2021-11-05 DIAGNOSIS — Z09 Encounter for follow-up examination after completed treatment for conditions other than malignant neoplasm: Secondary | ICD-10-CM | POA: Diagnosis not present

## 2021-11-05 DIAGNOSIS — M542 Cervicalgia: Secondary | ICD-10-CM

## 2021-11-05 DIAGNOSIS — Z9181 History of falling: Secondary | ICD-10-CM

## 2021-11-05 DIAGNOSIS — I5033 Acute on chronic diastolic (congestive) heart failure: Secondary | ICD-10-CM | POA: Diagnosis not present

## 2021-11-05 DIAGNOSIS — N492 Inflammatory disorders of scrotum: Secondary | ICD-10-CM | POA: Diagnosis not present

## 2021-11-05 DIAGNOSIS — K4030 Unilateral inguinal hernia, with obstruction, without gangrene, not specified as recurrent: Secondary | ICD-10-CM | POA: Diagnosis not present

## 2021-11-05 MED ORDER — METHOCARBAMOL 500 MG PO TABS: 500.0000 mg | ORAL_TABLET | Freq: Three times a day (TID) | ORAL | 0 refills | Status: AC | PRN
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
## 2021-11-06 ENCOUNTER — Encounter: Payer: Self-pay | Admitting: General Surgery

## 2021-11-06 ENCOUNTER — Ambulatory Visit (INDEPENDENT_AMBULATORY_CARE_PROVIDER_SITE_OTHER): Payer: Medicare HMO | Admitting: General Surgery

## 2021-11-06 ENCOUNTER — Telehealth: Payer: Self-pay | Admitting: *Deleted

## 2021-11-06 DIAGNOSIS — Z09 Encounter for follow-up examination after completed treatment for conditions other than malignant neoplasm: Secondary | ICD-10-CM

## 2021-11-06 LAB — BMP8+EGFR
BUN/Creatinine Ratio: 20 (ref 10–24)
BUN: 34 mg/dL — ABNORMAL HIGH (ref 8–27)
CO2: 25 mmol/L (ref 20–29)
Calcium: 7.9 mg/dL — ABNORMAL LOW (ref 8.6–10.2)
Chloride: 104 mmol/L (ref 96–106)
Creatinine, Ser: 1.7 mg/dL — ABNORMAL HIGH (ref 0.76–1.27)
Glucose: 97 mg/dL (ref 70–99)
Potassium: 6.1 mmol/L (ref 3.5–5.2)
Sodium: 142 mmol/L (ref 134–144)
eGFR: 41 mL/min/{1.73_m2} — ABNORMAL LOW (ref >59)

## 2021-11-06 LAB — CBC WITH DIFFERENTIAL/PLATELET
Basophils Absolute: 0.1 10*3/uL (ref 0.0–0.2)
Basos: 1 %
EOS (ABSOLUTE): 0.2 10*3/uL (ref 0.0–0.4)
Eos: 1 %
Hematocrit: 31.2 % — ABNORMAL LOW (ref 37.5–51.0)
Hemoglobin: 10.6 g/dL — ABNORMAL LOW (ref 13.0–17.7)
Immature Grans (Abs): 0.1 10*3/uL (ref 0.0–0.1)
Immature Granulocytes: 1 %
Lymphocytes Absolute: 3.2 10*3/uL — ABNORMAL HIGH (ref 0.7–3.1)
Lymphs: 21 %
MCH: 29.9 pg (ref 26.6–33.0)
MCHC: 34 g/dL (ref 31.5–35.7)
MCV: 88 fL (ref 79–97)
Monocytes Absolute: 2 10*3/uL — ABNORMAL HIGH (ref 0.1–0.9)
Monocytes: 13 %
Neutrophils Absolute: 9.8 10*3/uL — ABNORMAL HIGH (ref 1.4–7.0)
Neutrophils: 63 %
Platelets: 562 10*3/uL — ABNORMAL HIGH (ref 150–450)
RBC: 3.54 x10E6/uL — ABNORMAL LOW (ref 4.14–5.80)
RDW: 13 % (ref 11.6–15.4)
WBC: 15.3 10*3/uL — ABNORMAL HIGH (ref 3.4–10.8)

## 2021-11-06 NOTE — Progress Notes (Signed)
Virtual telephone postoperative visit performed with patient.  Patient states he fell earlier this week and scraped his face.  He has been seen by his primary care physician and states he is doing better.  The he does not have any significant swelling in the groin region.  He denies any fever or chills.  I told him to call me should any further problems arise. ? ?As this was a part of the total global surgical fee, this was not a billable visit.  Total telephone time was 1-1/2 minutes. ?

## 2021-11-06 NOTE — Telephone Encounter (Signed)
Dustin Dominguez ?Key: [id number] ? PA Case ID: [id number] ?Rx #: [id number] ?Outcome ?Approved ?Your request has been approved ? ?Methocarbamol 500 mg ?

## 2021-11-09 ENCOUNTER — Other Ambulatory Visit: Payer: Medicare HMO

## 2021-11-17 ENCOUNTER — Telehealth: Payer: Self-pay | Admitting: Cardiology

## 2021-11-17 NOTE — Telephone Encounter (Signed)
Patient states he had an echo in the hospital and would like to know if he still needs the one scheduled at the office.  ?

## 2021-11-18 ENCOUNTER — Ambulatory Visit: Payer: Medicare HMO | Admitting: Cardiology

## 2021-11-19 ENCOUNTER — Other Ambulatory Visit: Payer: Medicare HMO

## 2021-11-20 ENCOUNTER — Ambulatory Visit (HOSPITAL_COMMUNITY): Payer: Medicare HMO

## 2021-11-26 ENCOUNTER — Other Ambulatory Visit: Payer: Self-pay | Admitting: Family Medicine

## 2021-11-26 DIAGNOSIS — N289 Disorder of kidney and ureter, unspecified: Secondary | ICD-10-CM

## 2021-11-26 DIAGNOSIS — K219 Gastro-esophageal reflux disease without esophagitis: Secondary | ICD-10-CM

## 2021-11-30 ENCOUNTER — Inpatient Hospital Stay (HOSPITAL_COMMUNITY)
Admission: EM | Admit: 2021-11-30 | Discharge: 2021-12-05 | DRG: 435 | Disposition: A | Discharge status: Home Health | Payer: Medicare HMO | Attending: Family Medicine | Admitting: Family Medicine

## 2021-11-30 ENCOUNTER — Emergency Department (HOSPITAL_COMMUNITY): Payer: Medicare HMO

## 2021-11-30 ENCOUNTER — Encounter (HOSPITAL_COMMUNITY): Payer: Self-pay

## 2021-11-30 DIAGNOSIS — Z9049 Acquired absence of other specified parts of digestive tract: Secondary | ICD-10-CM | POA: Diagnosis not present

## 2021-11-30 DIAGNOSIS — Z955 Presence of coronary angioplasty implant and graft: Secondary | ICD-10-CM | POA: Diagnosis not present

## 2021-11-30 DIAGNOSIS — Z9081 Acquired absence of spleen: Secondary | ICD-10-CM | POA: Diagnosis not present

## 2021-11-30 DIAGNOSIS — E1143 Type 2 diabetes mellitus with diabetic autonomic (poly)neuropathy: Secondary | ICD-10-CM | POA: Diagnosis present

## 2021-11-30 DIAGNOSIS — D649 Anemia, unspecified: Secondary | ICD-10-CM | POA: Diagnosis present

## 2021-11-30 DIAGNOSIS — E8809 Other disorders of plasma-protein metabolism, not elsewhere classified: Secondary | ICD-10-CM | POA: Diagnosis present

## 2021-11-30 DIAGNOSIS — E782 Mixed hyperlipidemia: Secondary | ICD-10-CM | POA: Diagnosis present

## 2021-11-30 DIAGNOSIS — F5000 Anorexia nervosa, unspecified: Secondary | ICD-10-CM | POA: Diagnosis present

## 2021-11-30 DIAGNOSIS — K769 Liver disease, unspecified: Secondary | ICD-10-CM

## 2021-11-30 DIAGNOSIS — R933 Abnormal findings on diagnostic imaging of other parts of digestive tract: Secondary | ICD-10-CM | POA: Diagnosis not present

## 2021-11-30 DIAGNOSIS — I252 Old myocardial infarction: Secondary | ICD-10-CM

## 2021-11-30 DIAGNOSIS — G894 Chronic pain syndrome: Secondary | ICD-10-CM | POA: Diagnosis not present

## 2021-11-30 DIAGNOSIS — Z85820 Personal history of malignant melanoma of skin: Secondary | ICD-10-CM

## 2021-11-30 DIAGNOSIS — R188 Other ascites: Secondary | ICD-10-CM | POA: Diagnosis present

## 2021-11-30 DIAGNOSIS — Z833 Family history of diabetes mellitus: Secondary | ICD-10-CM

## 2021-11-30 DIAGNOSIS — K219 Gastro-esophageal reflux disease without esophagitis: Secondary | ICD-10-CM | POA: Diagnosis present

## 2021-11-30 DIAGNOSIS — E440 Moderate protein-calorie malnutrition: Secondary | ICD-10-CM | POA: Diagnosis present

## 2021-11-30 DIAGNOSIS — I480 Paroxysmal atrial fibrillation: Secondary | ICD-10-CM

## 2021-11-30 DIAGNOSIS — K4090 Unilateral inguinal hernia, without obstruction or gangrene, not specified as recurrent: Secondary | ICD-10-CM | POA: Diagnosis not present

## 2021-11-30 DIAGNOSIS — R109 Unspecified abdominal pain: Secondary | ICD-10-CM | POA: Diagnosis present

## 2021-11-30 DIAGNOSIS — Z7901 Long term (current) use of anticoagulants: Secondary | ICD-10-CM

## 2021-11-30 DIAGNOSIS — Z8249 Family history of ischemic heart disease and other diseases of the circulatory system: Secondary | ICD-10-CM

## 2021-11-30 DIAGNOSIS — K3189 Other diseases of stomach and duodenum: Secondary | ICD-10-CM | POA: Diagnosis not present

## 2021-11-30 DIAGNOSIS — I81 Portal vein thrombosis: Secondary | ICD-10-CM

## 2021-11-30 DIAGNOSIS — G8929 Other chronic pain: Secondary | ICD-10-CM | POA: Diagnosis present

## 2021-11-30 DIAGNOSIS — K3184 Gastroparesis: Secondary | ICD-10-CM | POA: Diagnosis present

## 2021-11-30 DIAGNOSIS — R1084 Generalized abdominal pain: Secondary | ICD-10-CM | POA: Diagnosis not present

## 2021-11-30 DIAGNOSIS — K529 Noninfective gastroenteritis and colitis, unspecified: Secondary | ICD-10-CM

## 2021-11-30 DIAGNOSIS — N400 Enlarged prostate without lower urinary tract symptoms: Secondary | ICD-10-CM | POA: Diagnosis present

## 2021-11-30 DIAGNOSIS — K766 Portal hypertension: Secondary | ICD-10-CM | POA: Diagnosis present

## 2021-11-30 DIAGNOSIS — Z951 Presence of aortocoronary bypass graft: Secondary | ICD-10-CM

## 2021-11-30 DIAGNOSIS — C220 Liver cell carcinoma: Principal | ICD-10-CM | POA: Diagnosis present

## 2021-11-30 DIAGNOSIS — I10 Essential (primary) hypertension: Secondary | ICD-10-CM | POA: Diagnosis present

## 2021-11-30 DIAGNOSIS — K7460 Unspecified cirrhosis of liver: Secondary | ICD-10-CM | POA: Diagnosis present

## 2021-11-30 DIAGNOSIS — I2510 Atherosclerotic heart disease of native coronary artery without angina pectoris: Secondary | ICD-10-CM | POA: Diagnosis present

## 2021-11-30 DIAGNOSIS — Z8673 Personal history of transient ischemic attack (TIA), and cerebral infarction without residual deficits: Secondary | ICD-10-CM | POA: Diagnosis not present

## 2021-11-30 DIAGNOSIS — F1190 Opioid use, unspecified, uncomplicated: Secondary | ICD-10-CM | POA: Diagnosis present

## 2021-11-30 DIAGNOSIS — Z8711 Personal history of peptic ulcer disease: Secondary | ICD-10-CM

## 2021-11-30 DIAGNOSIS — Z79891 Long term (current) use of opiate analgesic: Secondary | ICD-10-CM

## 2021-11-30 DIAGNOSIS — F17210 Nicotine dependence, cigarettes, uncomplicated: Secondary | ICD-10-CM | POA: Diagnosis present

## 2021-11-30 DIAGNOSIS — Z800 Family history of malignant neoplasm of digestive organs: Secondary | ICD-10-CM

## 2021-11-30 DIAGNOSIS — E559 Vitamin D deficiency, unspecified: Secondary | ICD-10-CM | POA: Diagnosis present

## 2021-11-30 DIAGNOSIS — R1310 Dysphagia, unspecified: Secondary | ICD-10-CM | POA: Diagnosis present

## 2021-11-30 DIAGNOSIS — Z79899 Other long term (current) drug therapy: Secondary | ICD-10-CM

## 2021-11-30 LAB — CBC WITH DIFFERENTIAL/PLATELET
Abs Immature Granulocytes: 0.11 10*3/uL — ABNORMAL HIGH (ref 0.00–0.07)
Basophils Absolute: 0.1 10*3/uL (ref 0.0–0.1)
Basophils Relative: 1 %
Eosinophils Absolute: 0.1 10*3/uL (ref 0.0–0.5)
Eosinophils Relative: 1 %
HCT: 29.3 % — ABNORMAL LOW (ref 39.0–52.0)
Hemoglobin: 9.3 g/dL — ABNORMAL LOW (ref 13.0–17.0)
Immature Granulocytes: 1 %
Lymphocytes Relative: 18 %
Lymphs Abs: 3.4 10*3/uL (ref 0.7–4.0)
MCH: 28.4 pg (ref 26.0–34.0)
MCHC: 31.7 g/dL (ref 30.0–36.0)
MCV: 89.3 fL (ref 80.0–100.0)
Monocytes Absolute: 2 10*3/uL — ABNORMAL HIGH (ref 0.1–1.0)
Monocytes Relative: 11 %
Neutro Abs: 13.3 10*3/uL — ABNORMAL HIGH (ref 1.7–7.7)
Neutrophils Relative %: 68 %
Platelets: 498 10*3/uL — ABNORMAL HIGH (ref 150–400)
RBC: 3.28 MIL/uL — ABNORMAL LOW (ref 4.22–5.81)
RDW: 18.4 % — ABNORMAL HIGH (ref 11.5–15.5)
WBC: 19 10*3/uL — ABNORMAL HIGH (ref 4.0–10.5)
nRBC: 0 % (ref 0.0–0.2)

## 2021-11-30 LAB — URINALYSIS, ROUTINE W REFLEX MICROSCOPIC
Bacteria, UA: NONE SEEN
Bilirubin Urine: NEGATIVE
Glucose, UA: NEGATIVE mg/dL
Ketones, ur: NEGATIVE mg/dL
Leukocytes,Ua: NEGATIVE
Nitrite: NEGATIVE
Protein, ur: 100 mg/dL — AB
Specific Gravity, Urine: 1.028 (ref 1.005–1.030)
pH: 5 (ref 5.0–8.0)

## 2021-11-30 LAB — COMPREHENSIVE METABOLIC PANEL
ALT: 19 U/L (ref 0–44)
AST: 72 U/L — ABNORMAL HIGH (ref 15–41)
Albumin: 1.7 g/dL — ABNORMAL LOW (ref 3.5–5.0)
Alkaline Phosphatase: 262 U/L — ABNORMAL HIGH (ref 38–126)
Anion gap: 5 (ref 5–15)
BUN: 19 mg/dL (ref 8–23)
CO2: 24 mmol/L (ref 22–32)
Calcium: 6.4 mg/dL — CL (ref 8.9–10.3)
Chloride: 113 mmol/L — ABNORMAL HIGH (ref 98–111)
Creatinine, Ser: 0.89 mg/dL (ref 0.61–1.24)
GFR, Estimated: >60 mL/min (ref >60)
Glucose, Bld: 90 mg/dL (ref 70–99)
Potassium: 4 mmol/L (ref 3.5–5.1)
Sodium: 142 mmol/L (ref 135–145)
Total Bilirubin: 1.2 mg/dL (ref 0.3–1.2)
Total Protein: 5.7 g/dL — ABNORMAL LOW (ref 6.5–8.1)

## 2021-11-30 LAB — MAGNESIUM: Magnesium: 0.7 mg/dL — CL (ref 1.7–2.4)

## 2021-11-30 LAB — LIPASE, BLOOD: Lipase: 33 U/L (ref 11–51)

## 2021-11-30 MED ORDER — LACTATED RINGERS IV BOLUS
500.0000 mL | Freq: Once | INTRAVENOUS | Status: AC
  Administered 2021-11-30: 500 mL via INTRAVENOUS

## 2021-11-30 MED ORDER — BOOST / RESOURCE BREEZE PO LIQD CUSTOM
1.0000 | Freq: Three times a day (TID) | ORAL | Status: DC
  Administered 2021-12-01 – 2021-12-04 (×9): 1 via ORAL
  Filled 2021-11-30: qty 1

## 2021-11-30 MED ORDER — ALBUTEROL SULFATE (2.5 MG/3ML) 0.083% IN NEBU: 3.0000 mL | INHALATION_SOLUTION | Freq: Four times a day (QID) | RESPIRATORY_TRACT | Status: DC | PRN

## 2021-11-30 MED ORDER — ONDANSETRON HCL 4 MG PO TABS: 4.0000 mg | ORAL_TABLET | Freq: Four times a day (QID) | ORAL | Status: DC | PRN

## 2021-11-30 MED ORDER — CIPROFLOXACIN IN D5W 400 MG/200ML IV SOLN
400.0000 mg | Freq: Once | INTRAVENOUS | Status: AC
  Administered 2021-11-30: 400 mg via INTRAVENOUS
  Filled 2021-11-30: qty 200

## 2021-11-30 MED ORDER — ONDANSETRON HCL 4 MG/2ML IJ SOLN: 4.0000 mg | Freq: Four times a day (QID) | INTRAMUSCULAR | Status: DC | PRN

## 2021-11-30 MED ORDER — HYDROMORPHONE HCL 1 MG/ML IJ SOLN
1.0000 mg | Freq: Once | INTRAMUSCULAR | Status: AC
  Administered 2021-11-30: 1 mg via INTRAVENOUS
  Filled 2021-11-30: qty 1

## 2021-11-30 MED ORDER — HEPARIN (PORCINE) 25000 UT/250ML-% IV SOLN
1150.0000 [IU]/h | INTRAVENOUS | Status: DC
  Administered 2021-12-01: 1400 [IU]/h via INTRAVENOUS
  Administered 2021-12-01: 1150 [IU]/h via INTRAVENOUS
  Filled 2021-11-30 (×2): qty 250

## 2021-11-30 MED ORDER — POLYETHYLENE GLYCOL 3350 17 G PO PACK: 17.0000 g | PACK | Freq: Every day | ORAL | Status: DC | PRN

## 2021-11-30 MED ORDER — ONDANSETRON HCL 4 MG/2ML IJ SOLN
4.0000 mg | Freq: Once | INTRAMUSCULAR | Status: AC
Start: 2021-11-30 — End: 2021-11-30
  Administered 2021-11-30: 4 mg via INTRAVENOUS
  Filled 2021-11-30: qty 2

## 2021-11-30 MED ORDER — ISOSORBIDE MONONITRATE ER 60 MG PO TB24
30.0000 mg | ORAL_TABLET | Freq: Every day | ORAL | Status: DC
  Administered 2021-12-01 – 2021-12-05 (×4): 30 mg via ORAL
  Filled 2021-11-30 (×5): qty 1

## 2021-11-30 MED ORDER — IOHEXOL 300 MG/ML  SOLN
100.0000 mL | Freq: Once | INTRAMUSCULAR | Status: AC | PRN
  Administered 2021-11-30: 100 mL via INTRAVENOUS

## 2021-11-30 MED ORDER — ACETAMINOPHEN 650 MG RE SUPP: 650.0000 mg | Freq: Four times a day (QID) | RECTAL | Status: DC | PRN

## 2021-11-30 MED ORDER — HEPARIN BOLUS VIA INFUSION
2000.0000 [IU] | Freq: Once | INTRAVENOUS | Status: AC
  Administered 2021-12-01: 2000 [IU] via INTRAVENOUS
  Filled 2021-11-30: qty 2000

## 2021-11-30 MED ORDER — METRONIDAZOLE 500 MG/100ML IV SOLN: 500.0000 mg | Freq: Once | INTRAVENOUS | Status: DC

## 2021-11-30 MED ORDER — PANTOPRAZOLE SODIUM 40 MG IV SOLR
40.0000 mg | Freq: Two times a day (BID) | INTRAVENOUS | Status: DC
  Administered 2021-11-30 – 2021-12-05 (×9): 40 mg via INTRAVENOUS
  Filled 2021-11-30 (×10): qty 10

## 2021-11-30 MED ORDER — ONDANSETRON HCL 4 MG/2ML IJ SOLN
4.0000 mg | Freq: Once | INTRAMUSCULAR | Status: AC
  Administered 2021-11-30: 4 mg via INTRAVENOUS
  Filled 2021-11-30: qty 2

## 2021-11-30 MED ORDER — SODIUM CHLORIDE 0.9 % IV SOLN
2.0000 g | INTRAVENOUS | Status: DC
  Administered 2021-12-01 – 2021-12-05 (×4): 2 g via INTRAVENOUS
  Filled 2021-11-30 (×5): qty 20

## 2021-11-30 MED ORDER — METRONIDAZOLE 500 MG/100ML IV SOLN
500.0000 mg | Freq: Two times a day (BID) | INTRAVENOUS | Status: DC
  Administered 2021-12-01 – 2021-12-05 (×10): 500 mg via INTRAVENOUS
  Filled 2021-11-30 (×10): qty 100

## 2021-11-30 MED ORDER — ACETAMINOPHEN 325 MG PO TABS
650.0000 mg | ORAL_TABLET | Freq: Four times a day (QID) | ORAL | Status: DC | PRN
  Administered 2021-12-01: 650 mg via ORAL
  Filled 2021-11-30: qty 2

## 2021-11-30 MED ORDER — HYDROMORPHONE HCL 1 MG/ML IJ SOLN
1.0000 mg | INTRAMUSCULAR | Status: DC | PRN
  Administered 2021-12-01 – 2021-12-05 (×17): 1 mg via INTRAVENOUS
  Filled 2021-11-30 (×17): qty 1

## 2021-11-30 MED ORDER — METOPROLOL TARTRATE 25 MG PO TABS
25.0000 mg | ORAL_TABLET | Freq: Two times a day (BID) | ORAL | Status: DC
  Administered 2021-11-30 – 2021-12-05 (×9): 25 mg via ORAL
  Filled 2021-11-30 (×10): qty 1

## 2021-11-30 MED ORDER — LACTATED RINGERS IV SOLN: INTRAVENOUS | Status: AC

## 2021-11-30 MED ORDER — MAGNESIUM SULFATE 4 GM/100ML IV SOLN
4.0000 g | Freq: Once | INTRAVENOUS | Status: AC
  Administered 2021-11-30: 4 g via INTRAVENOUS
  Filled 2021-11-30: qty 100

## 2021-11-30 NOTE — ED Notes (Signed)
Date and time results received: 11/30/21 1540 ? ?Test: Ca= ?Critical Value: 6.4 ? ?Name of Provider Notified: Fransico Meadow, PA-C ? ?Orders Received? Or Actions Taken?: See new orders  ?

## 2021-11-30 NOTE — Assessment & Plan Note (Addendum)
Presenting with diffuse abdominal pain, some diarrhea. 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  He is on Eliquis for atrial fibrillation and reports compliance since hospitalization for hernia repair 4/9 - 4/13.  As to etiology, he has no history of liver cirrhosis, CT suggesting liver lesion-metastatic disease not excluded and further evaluation MRI needed.  Unable to rule out if he has actually had blood clots in the past. ?-IV heparin for now, hold apixaban.  ?-obtain hematology for recommendations on anticoagulation when ready to transition off IV heparin ?-Prothrombotic work-up - factor V Leiden, protein C activity and free protein S, Antithrombin III, prothrombin III mutation, factor 8. ?

## 2021-11-30 NOTE — Assessment & Plan Note (Addendum)
CT showing indeterminate 4 cm lesion in inferior right lobe of liver, MRI liver recommended for further evaluation.   ?-MRI per GI recommendation, may also need biopsy  ?

## 2021-11-30 NOTE — ED Triage Notes (Signed)
Pt brought in via [facility] c/o generalized weakness. Unable to eat or drink the past two days stomach pains. States that he hasn't been been the same since hernia repair 4 weeks ago.  ?
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
## 2021-11-30 NOTE — Assessment & Plan Note (Signed)
Blood pressure elevated. ?-Resume Imdur, metoprolol  ?- hold lisinopril for contrast exposure ?
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
## 2021-11-30 NOTE — Assessment & Plan Note (Addendum)
CT abd/pelvis with contrast suggesting nonspecific colitis in the setting of possible portal vein thrombosis.  Reports nausea without vomiting, and episodes of diarrhea.  Afebrile.  Leukocytosis of 19.  Rules out for sepsis. ?-500 mill bolus, continue LR 100cc/hr x 15 hrs ?-Bowel rest with clear liquid diet ?-IV Dilaudid 1 mg every 4 hourly as needed ?-IV ceftriaxone and metronidazole ?
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
## 2021-11-30 NOTE — Assessment & Plan Note (Addendum)
CT today also suggesting gastritis.  Hemoglobin  9.3, baseline 10 - 11 . ?-IV Protonix 40 twice daily ?- monitor in anticoag. ?

## 2021-11-30 NOTE — Assessment & Plan Note (Signed)
Surgical sites looks clean without open wounds or drainage and almost completely healed. ?

## 2021-11-30 NOTE — Assessment & Plan Note (Signed)
He tells me he takes 30 mg of morphine 3 times daily, and Percocets 10 mg 6 times daily. ?-Hold home pain regimen ?- Dilaudid 1 mg every 4 hourly as needed for now ?

## 2021-11-30 NOTE — ED Notes (Signed)
Report called to [place], RN on [phone] ?

## 2021-12-01 ENCOUNTER — Inpatient Hospital Stay (HOSPITAL_COMMUNITY): Payer: Medicare HMO

## 2021-12-01 DIAGNOSIS — R1084 Generalized abdominal pain: Secondary | ICD-10-CM

## 2021-12-01 DIAGNOSIS — R933 Abnormal findings on diagnostic imaging of other parts of digestive tract: Secondary | ICD-10-CM | POA: Diagnosis not present

## 2021-12-01 DIAGNOSIS — K769 Liver disease, unspecified: Secondary | ICD-10-CM | POA: Diagnosis not present

## 2021-12-01 DIAGNOSIS — G894 Chronic pain syndrome: Secondary | ICD-10-CM | POA: Diagnosis not present

## 2021-12-01 DIAGNOSIS — I81 Portal vein thrombosis: Secondary | ICD-10-CM | POA: Diagnosis not present

## 2021-12-01 DIAGNOSIS — K529 Noninfective gastroenteritis and colitis, unspecified: Secondary | ICD-10-CM | POA: Diagnosis not present

## 2021-12-01 DIAGNOSIS — I480 Paroxysmal atrial fibrillation: Secondary | ICD-10-CM | POA: Diagnosis not present

## 2021-12-01 LAB — HEPATIC FUNCTION PANEL
ALT: 19 U/L (ref 0–44)
AST: 68 U/L — ABNORMAL HIGH (ref 15–41)
Albumin: 1.8 g/dL — ABNORMAL LOW (ref 3.5–5.0)
Alkaline Phosphatase: 287 U/L — ABNORMAL HIGH (ref 38–126)
Bilirubin, Direct: 0.6 mg/dL — ABNORMAL HIGH (ref 0.0–0.2)
Indirect Bilirubin: 0.6 mg/dL (ref 0.3–0.9)
Total Bilirubin: 1.2 mg/dL (ref 0.3–1.2)
Total Protein: 5.8 g/dL — ABNORMAL LOW (ref 6.5–8.1)

## 2021-12-01 LAB — BASIC METABOLIC PANEL
Anion gap: 7 (ref 5–15)
BUN: 18 mg/dL (ref 8–23)
CO2: 20 mmol/L — ABNORMAL LOW (ref 22–32)
Calcium: 6.5 mg/dL — ABNORMAL LOW (ref 8.9–10.3)
Chloride: 113 mmol/L — ABNORMAL HIGH (ref 98–111)
Creatinine, Ser: 0.92 mg/dL (ref 0.61–1.24)
GFR, Estimated: >60 mL/min (ref >60)
Glucose, Bld: 76 mg/dL (ref 70–99)
Potassium: 3.8 mmol/L (ref 3.5–5.1)
Sodium: 140 mmol/L (ref 135–145)

## 2021-12-01 LAB — ANTITHROMBIN III: AntiThromb III Func: 71 % — ABNORMAL LOW (ref 75–120)

## 2021-12-01 LAB — APTT
aPTT: >200 seconds (ref 24–36)
aPTT: >200 seconds (ref 24–36)

## 2021-12-01 LAB — CBC
HCT: 30.5 % — ABNORMAL LOW (ref 39.0–52.0)
Hemoglobin: 9.8 g/dL — ABNORMAL LOW (ref 13.0–17.0)
MCH: 29.5 pg (ref 26.0–34.0)
MCHC: 32.1 g/dL (ref 30.0–36.0)
MCV: 91.9 fL (ref 80.0–100.0)
Platelets: 515 10*3/uL — ABNORMAL HIGH (ref 150–400)
RBC: 3.32 MIL/uL — ABNORMAL LOW (ref 4.22–5.81)
RDW: 19.5 % — ABNORMAL HIGH (ref 11.5–15.5)
WBC: 19.5 10*3/uL — ABNORMAL HIGH (ref 4.0–10.5)
nRBC: 0 % (ref 0.0–0.2)

## 2021-12-01 LAB — HEPARIN LEVEL (UNFRACTIONATED): Heparin Unfractionated: >1.1 IU/mL — ABNORMAL HIGH (ref 0.30–0.70)

## 2021-12-01 MED ORDER — HEPARIN (PORCINE) 25000 UT/250ML-% IV SOLN
1000.0000 [IU]/h | INTRAVENOUS | Status: AC
  Administered 2021-12-01 – 2021-12-03 (×2): 900 [IU]/h via INTRAVENOUS
  Filled 2021-12-01: qty 250

## 2021-12-01 NOTE — Hospital Course (Addendum)
81 y.o. male with medical history significant for  HTN, OSA, Atria Fibrillation, PUD, CAD. ?Patient presented to the ED with complaints of abdominal pain of about 3 weeks duration. ?Patient was admitted to the hospital about a month ago 4/9 to 4/13, had repair of a large inguinal hernia by general surgeon Dr. Arnoldo Morale, had inguinal hernia repair.  He was also managed for scrotal cellulitis with IV antibiotics and discharged home on Bactrim. ?  ?Patient reports abdominal pain started about the time of the hernia, but he attributed it to his peptic ulcer disease which he has a history of.  He went home and did well from a surgical standpoint.  He has had persistent and worsening mostly mid-abdominal pain since onset.  Reports some episodes of diarrhea on average ranging from no bowel movements to 3-4 bowel movements a day.  Reports nausea with no vomiting.  He does not take lactulose listed on medication list ?  ?Eliquis was held for surgery about a month ago.  He was discharged home to resume Eliquis  4/13 and has been compliant with his medications since then.  He tells me he has had blood clots in the past from his ???? neck to his back ????.  ?  ?ED Course: Afebrile temperature 98.7.  Stable vitals.  WBC 19.  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 ?IV ciprofloxacin and metronidazole started. ?Hospitalist admit for colitis. ? ?12/01/2021: GI consultation requested, remains on IV heparin infusion.  ?
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
## 2021-12-01 NOTE — Progress Notes (Signed)
?   11/30/21 2250  ?Provider Notification  ?Provider Name/Title Dr. Sidney Ace  ?Date Provider Notified 11/30/21  ?Time Provider Notified 2330  ?Method of Notification Page  ?Notification Reason Critical result  ?Test performed and critical result magnesium 0.7  ?Date Critical Result Received 11/30/21  ?Time Critical Result Received 2329  ?Provider response In department;See new orders  ?Date of Provider Response 11/30/21  ?Time of Provider Response 2345  ? ? ?

## 2021-12-01 NOTE — Progress Notes (Signed)
ANTICOAGULATION CONSULT NOTE - Follow Up Consult ? ?Pharmacy Consult for Heparin ?Indication:  portal vein thrombosis ? ?Allergies  ?Allergen Reactions  ? Amitriptyline Other (See Comments)  ?  sleepy  ? Bextra [Valdecoxib] Other (See Comments)  ?  unknown  ? Codeine Nausea And Vomiting  ? Cymbalta [Duloxetine Hcl] Swelling and Other (See Comments)  ?  dizzy  ? Esomeprazole Magnesium Diarrhea  ? Niacin-Lovastatin Er Other (See Comments)  ?  headache  ? Niaspan [Niacin Er] Other (See Comments)  ?  Increased headache  ? Penicillins Rash  ?  Has patient had a PCN reaction causing immediate rash, facial/tongue/throat swelling, SOB or lightheadedness with hypotension: Yes ?Has patient had a PCN reaction causing severe rash involving mucus membranes or skin necrosis: No ?Has patient had a PCN reaction that required hospitalization No ?Has patient had a PCN reaction occurring within the last 10 years: No ?Tolerated Zosyn 06/2016 ?  ? ? ?Patient Measurements: ?Height: '5\' 11"'$  (180.3 cm) ?Weight: 79.4 kg (175 lb) ?IBW/kg (Calculated) : 75.3 ?Heparin Dosing Weight: 79 kg ? ?Vital Signs: ?Temp: 98.1 ?F (36.7 ?C) (05/16 2144) ?Temp Source: Oral (05/16 2144) ?BP: 130/58 (05/16 2144) ?Pulse Rate: 71 (05/16 2144) ? ?Labs: ?Recent Labs  ?  11/30/21 ?1443 12/01/21 ?2952 12/01/21 ?0800 12/01/21 ?2011  ?HGB 9.3* 9.8*  --   --   ?HCT 29.3* 30.5*  --   --   ?PLT 498* 515*  --   --   ?APTT  --   --  >200* >200*  ?HEPARINUNFRC  --   --  >1.10*  --   ?CREATININE 0.89 0.92  --   --   ? ? ?Estimated Creatinine Clearance: 69.3 mL/min (by C-G formula based on SCr of 0.92 mg/dL). ? ?Assessment: ?81 y.o. male with abdominal pain of about 3 weeks duration. Hospital admission 4/9 to 4/13 for large inguinal hernia repair. Patient taking Eliquis PTA for Atrial Fibrillation (Last dose reported 5/15 AM). Pharmacy consulted for IV heparin dosing. Will use aPTTs to monitor while heparin levels are falsely elevated due to recent Eliquis doses. ? ?  aPTT >  200 seconds (supratherapeutic) tonight on 1150 units/hr.  Drip held for an hour earlier today when aPTT >200 seconds on 1400 units/hr, then resumed at lower rate.  RN reports sample drawn appropriately from opposite arm of heparin infusion. No bleeding noted. ? ?Goal of Therapy:  ?Heparin level 0.3-0.7 units/ml ?aPTT 66-102 seconds ?Monitor platelets by anticoagulation protocol: Yes ?  ?Plan:  ?Hold heparin drip for 1 hour. ?Then resume heparin drip at 900 units/hr ?aPTT and heparin level ~8 hrs after resuming. ?Daily aPTT and heparin level until correlating. Daily CBC. ?Monitor for signs/ symptoms of bleeding. ?Eliquis on hold. ? ?Arty Baumgartner, RPh ?12/01/2021,9:59 PM ? ? ?

## 2021-12-01 NOTE — Progress Notes (Signed)
?  Transition of Care (TOC) Screening Note ? ? ?Patient Details  ?Name: Dustin Dominguez ?Date of Birth: 1940-11-21 ? ? ?Transition of Care (TOC) CM/SW Contact:    ?Marlynn Hinckley D, LCSW ?Phone Number: ?12/01/2021, 1:53 PM ? ? ? ?Transition of Care Department [facility]) has reviewed patient and no TOC needs have been identified at this time. We will continue to monitor patient advancement through interdisciplinary progression rounds. If new patient transition needs arise, please place a TOC consult. ?  ?
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
## 2021-12-01 NOTE — Progress Notes (Signed)
?   11/30/21 2230  ?Provider Notification  ?Provider Name/Title Dr. Sidney Ace  ?Date Provider Notified 11/30/21  ?Time Provider Notified 2320  ?Method of Notification Page  ?Notification Reason Other (Comment) ?(12 beat run of SVT)  ?Provider response See new orders  ?Date of Provider Response 11/30/21  ?Time of Provider Response 2325  ? ? ?

## 2021-12-01 NOTE — Assessment & Plan Note (Signed)
--   metoprolol reordered ?-- apixaban on hold now, IV heparin for full anticoagulation ?-- requested pharm D to assist with IV heparin management  ?

## 2021-12-01 NOTE — Progress Notes (Incomplete)
Initial Nutrition Assessment ? ?DOCUMENTATION CODES:  ? ?Non-severe (moderate) malnutrition in context of acute illness/injury ? ?INTERVENTION:  ?When diet is advanced to full liquids discontinue Boost Breeze ? ?Add Ensure Plus BID  ? ? ?NUTRITION DIAGNOSIS:  ? ?Moderate Malnutrition related to acute illness (abdominal pain, dizziness, recent hernia surgery) as evidenced by per patient/family report, energy intake < 75% for > 7 days. ? ? ?GOAL:  ?Patient will meet greater than or equal to 90% of their needs ? ? ?MONITOR:  ?Diet advancement, Supplement acceptance, Weight trends, Labs ? ?REASON FOR ASSESSMENT:  ? ?Malnutrition Screening Tool ?  ? ?ASSESSMENT: Patient is a 81 yo male with hx of PUD, CAD, HTN and atrial fibrillation. Presents with complaint of abdominal pain. Colitis per CT finding. Possible portal vein thrombosis. Left inguinal hernia (healing), CT-liver lesion- MRI recommended per MD.  ? ?Patient receiving clear liquids. Patient had daughter bring in a cheese dog from [employer] - said he was starving and had not eaten in several days. Boost breeze provided. Patient doesn't like the flavor much- prefers Ensure original.   ? ?Patient complains of dizziness accompanied by nausea  since he fell 2 weeks ago. Limited intake due to being "sick on his stomach".  ? ?Significant weight loss noted the past month 5.8% (11/02/21-84.2 kg to current 79.4 kg). Difficulty walking due to weakness per patient.  ? ?Labs: Magnesium 0.7 (L), WBC-19.5 (H), Hgb. 9.8 (L), Alb 1.8 (L). ?  ? ?NUTRITION - FOCUSED PHYSICAL EXAM: ?Nutrition-Focused physical exam completed. Findings are moderate fat depletion upper arms, mild temporal and moderate deltoid muscle depletion, and no edema.   ? ? ?Diet Order:   ?Diet Order   ? ?       ?  Diet clear liquid Room service appropriate? Yes; Fluid consistency: Thin  Diet effective now       ?  ? ?  ?  ? ?  ? ? ?EDUCATION NEEDS:  ?Education needs have been addressed ? ?Skin:  Skin  Assessment: Reviewed RN Assessment ? ?Last BM:  5/14 ? ?Height:  ? ?Ht Readings from Last 1 Encounters:  ?11/30/21 '5\' 11"'$  (1.803 m)  ? ? ?Weight:  ? ?Wt Readings from Last 1 Encounters:  ?11/30/21 79.4 kg  ? ? ?Ideal Body Weight:   78 kg ? ?BMI:  Body mass index is 24.41 kg/m?. ? ?Estimated Nutritional Needs:  ? ?Kcal:  2200-2400 ? ?Protein:  95-103 gr ? ?Fluid:  2 liters daily ? ?Colman Cater MS,RD,CSG,LDN ?Contact: AMION ?

## 2021-12-02 ENCOUNTER — Inpatient Hospital Stay (HOSPITAL_COMMUNITY): Payer: Medicare HMO

## 2021-12-02 DIAGNOSIS — I480 Paroxysmal atrial fibrillation: Secondary | ICD-10-CM | POA: Diagnosis not present

## 2021-12-02 DIAGNOSIS — K529 Noninfective gastroenteritis and colitis, unspecified: Secondary | ICD-10-CM | POA: Diagnosis not present

## 2021-12-02 DIAGNOSIS — R933 Abnormal findings on diagnostic imaging of other parts of digestive tract: Secondary | ICD-10-CM | POA: Diagnosis not present

## 2021-12-02 DIAGNOSIS — I81 Portal vein thrombosis: Secondary | ICD-10-CM | POA: Diagnosis not present

## 2021-12-02 DIAGNOSIS — G894 Chronic pain syndrome: Secondary | ICD-10-CM | POA: Diagnosis not present

## 2021-12-02 DIAGNOSIS — E440 Moderate protein-calorie malnutrition: Secondary | ICD-10-CM | POA: Insufficient documentation

## 2021-12-02 DIAGNOSIS — K769 Liver disease, unspecified: Secondary | ICD-10-CM | POA: Diagnosis not present

## 2021-12-02 LAB — CBC
HCT: 26.1 % — ABNORMAL LOW (ref 39.0–52.0)
Hemoglobin: 8.7 g/dL — ABNORMAL LOW (ref 13.0–17.0)
MCH: 29.6 pg (ref 26.0–34.0)
MCHC: 33.3 g/dL (ref 30.0–36.0)
MCV: 88.8 fL (ref 80.0–100.0)
Platelets: 454 10*3/uL — ABNORMAL HIGH (ref 150–400)
RBC: 2.94 MIL/uL — ABNORMAL LOW (ref 4.22–5.81)
RDW: 18.7 % — ABNORMAL HIGH (ref 11.5–15.5)
WBC: 17.8 10*3/uL — ABNORMAL HIGH (ref 4.0–10.5)
nRBC: 0 % (ref 0.0–0.2)

## 2021-12-02 LAB — APTT
aPTT: >200 seconds (ref 24–36)
aPTT: 124 seconds — ABNORMAL HIGH (ref 24–36)
aPTT: 121 seconds — ABNORMAL HIGH (ref 24–36)

## 2021-12-02 LAB — AFP TUMOR MARKER: AFP, Serum, Tumor Marker: 14.5 ng/mL — ABNORMAL HIGH (ref 0.0–8.4)

## 2021-12-02 LAB — BASIC METABOLIC PANEL
Anion gap: 3 — ABNORMAL LOW (ref 5–15)
BUN: 24 mg/dL — ABNORMAL HIGH (ref 8–23)
CO2: 23 mmol/L (ref 22–32)
Calcium: 6.4 mg/dL — CL (ref 8.9–10.3)
Chloride: 113 mmol/L — ABNORMAL HIGH (ref 98–111)
Creatinine, Ser: 1.03 mg/dL (ref 0.61–1.24)
GFR, Estimated: >60 mL/min (ref >60)
Glucose, Bld: 121 mg/dL — ABNORMAL HIGH (ref 70–99)
Potassium: 3.7 mmol/L (ref 3.5–5.1)
Sodium: 139 mmol/L (ref 135–145)

## 2021-12-02 LAB — HEPARIN LEVEL (UNFRACTIONATED)
Heparin Unfractionated: 0.62 IU/mL (ref 0.30–0.70)
Heparin Unfractionated: 0.34 IU/mL (ref 0.30–0.70)
Heparin Unfractionated: 0.28 IU/mL — ABNORMAL LOW (ref 0.30–0.70)

## 2021-12-02 LAB — MAGNESIUM: Magnesium: 1.2 mg/dL — ABNORMAL LOW (ref 1.7–2.4)

## 2021-12-02 MED ORDER — GADOBUTROL 1 MMOL/ML IV SOLN
7.5000 mL | Freq: Once | INTRAVENOUS | Status: AC | PRN
  Administered 2021-12-02: 7.5 mL via INTRAVENOUS

## 2021-12-02 MED ORDER — CALCIUM CARBONATE ANTACID 500 MG PO CHEW
400.0000 mg | CHEWABLE_TABLET | Freq: Two times a day (BID) | ORAL | Status: DC
  Administered 2021-12-02 – 2021-12-05 (×6): 400 mg via ORAL
  Filled 2021-12-02 (×7): qty 2

## 2021-12-02 MED ORDER — MAGNESIUM SULFATE 2 GM/50ML IV SOLN
2.0000 g | Freq: Once | INTRAVENOUS | Status: AC
Start: 2021-12-02 — End: 2021-12-02
  Administered 2021-12-02: 2 g via INTRAVENOUS
  Filled 2021-12-02: qty 50

## 2021-12-02 MED ORDER — SODIUM CHLORIDE 0.9 % IV SOLN: INTRAVENOUS | Status: DC | PRN

## 2021-12-02 NOTE — Progress Notes (Signed)
ANTICOAGULATION CONSULT NOTE - Follow Up Consult ? ?Pharmacy Consult for Heparin ?Indication:  portal vein thrombosis ? ?Allergies  ?Allergen Reactions  ? Amitriptyline Other (See Comments)  ?  sleepy  ? Bextra [Valdecoxib] Other (See Comments)  ?  unknown  ? Codeine Nausea And Vomiting  ? Cymbalta [Duloxetine Hcl] Swelling and Other (See Comments)  ?  dizzy  ? Esomeprazole Magnesium Diarrhea  ? Niacin-Lovastatin Er Other (See Comments)  ?  headache  ? Niaspan [Niacin Er] Other (See Comments)  ?  Increased headache  ? Penicillins Rash  ?  Has patient had a PCN reaction causing immediate rash, facial/tongue/throat swelling, SOB or lightheadedness with hypotension: Yes ?Has patient had a PCN reaction causing severe rash involving mucus membranes or skin necrosis: No ?Has patient had a PCN reaction that required hospitalization No ?Has patient had a PCN reaction occurring within the last 10 years: No ?Tolerated Zosyn 06/2016 ?  ? ? ?Patient Measurements: ?Height: '5\' 11"'$  (180.3 cm) ?Weight: 79.4 kg (175 lb) ?IBW/kg (Calculated) : 75.3 ?Heparin Dosing Weight: 79 kg ? ?Vital Signs: ?Temp: 98 ?F (36.7 ?C) (05/17 0600) ?Temp Source: Oral (05/17 0600) ?BP: 132/62 (05/17 0600) ?Pulse Rate: 72 (05/17 0600) ? ?Labs: ?Recent Labs  ?  11/30/21 ?1443 12/01/21 ?2751 12/01/21 ?0800 12/01/21 ?2011 12/02/21 ?7001  ?HGB 9.3* 9.8*  --   --  8.7*  ?HCT 29.3* 30.5*  --   --  26.1*  ?PLT 498* 515*  --   --  454*  ?APTT  --   --  >200* >200* >200*  ?HEPARINUNFRC  --   --  >1.10*  --  0.62  ?CREATININE 0.89 0.92  --   --  1.03  ? ? ? ?Estimated Creatinine Clearance: 61.9 mL/min (by C-G formula based on SCr of 1.03 mg/dL). ? ?Assessment: ?81 y.o. male with abdominal pain of about 3 weeks duration. Hospital admission 4/9 to 4/13 for large inguinal hernia repair. Patient taking Eliquis PTA for Atrial Fibrillation (Last dose reported 5/15 AM). Pharmacy consulted for IV heparin dosing. Will use aPTTs to monitor while heparin levels are falsely  elevated due to recent Eliquis doses. ? ?  aPTT > 200 seconds (supratherapeutic) ?HL 0.62 - therapeutic  ? ?Heparin levels trending down despite elevated APTT. No s/s of bleeding. Hemoglobin down slightly. Antiphospholipid level pending. ? ?Goal of Therapy:  ?Heparin level 0.3-0.7 units/ml ?aPTT 66-102 seconds ?Monitor platelets by anticoagulation protocol: Yes ?  ?Plan:  ?Decrease heparin infusion to 750 units/hr. ?Repeat APTT/HL in 8 hours and daily. ?Continue to monitor H&H and platelets. ? ? ?Margot Ables, PharmD ?Clinical Pharmacist ?12/02/2021 8:14 AM ? ? ? ? ?

## 2021-12-02 NOTE — Progress Notes (Signed)
ANTICOAGULATION CONSULT NOTE  ?Pharmacy Consult for Heparin ?Indication:  portal vein thrombosis ?Brief A/P: Heparin level subtherapeutic Increase Heparin rate ? ?Allergies  ?Allergen Reactions  ? Amitriptyline Other (See Comments)  ?  sleepy  ? Bextra [Valdecoxib] Other (See Comments)  ?  unknown  ? Codeine Nausea And Vomiting  ? Cymbalta [Duloxetine Hcl] Swelling and Other (See Comments)  ?  dizzy  ? Esomeprazole Magnesium Diarrhea  ? Niacin-Lovastatin Er Other (See Comments)  ?  headache  ? Niaspan [Niacin Er] Other (See Comments)  ?  Increased headache  ? Penicillins Rash  ?  Has patient had a PCN reaction causing immediate rash, facial/tongue/throat swelling, SOB or lightheadedness with hypotension: Yes ?Has patient had a PCN reaction causing severe rash involving mucus membranes or skin necrosis: No ?Has patient had a PCN reaction that required hospitalization No ?Has patient had a PCN reaction occurring within the last 10 years: No ?Tolerated Zosyn 06/2016 ?  ? ? ?Patient Measurements: ?Height: '5\' 11"'$  (180.3 cm) ?Weight: 79.4 kg (175 lb) ?IBW/kg (Calculated) : 75.3 ?Heparin Dosing Weight: 79 kg ? ?Vital Signs: ?Temp: 98.3 ?F (36.8 ?C) (05/17 2137) ?Temp Source: Oral (05/17 2137) ?BP: 146/67 (05/17 2137) ?Pulse Rate: 69 (05/17 2137) ? ?Labs: ?Recent Labs  ?  11/30/21 ?1443 12/01/21 ?0540 12/01/21 ?0800 12/02/21 ?7342 12/02/21 ?1600 12/02/21 ?2225  ?HGB 9.3* 9.8*  --  8.7*  --   --   ?HCT 29.3* 30.5*  --  26.1*  --   --   ?PLT 498* 515*  --  454*  --   --   ?APTT  --   --    < > >200* 124* 121*  ?HEPARINUNFRC  --   --    < > 0.62 0.34 0.28*  ?CREATININE 0.89 0.92  --  1.03  --   --   ? < > = values in this interval not displayed.  ? ? ? ?Estimated Creatinine Clearance: 61.9 mL/min (by C-G formula based on SCr of 1.03 mg/dL). ? ?Assessment: ?81 y.o. male with h/o Afib, Eliquis on hold, for heparin ? ?Goal of Therapy:  ?Heparin level 0.3-0.7 units/ml ?aPTT 66-102 seconds ?Monitor platelets by anticoagulation  protocol: Yes ?  ?Plan:  ?Increase Heparin 900 units/hr ?Follow-up am labs. ? ?Phillis Knack, PharmD, BCPS ?12/02/2021 11:47 PM ? ? ? ? ?
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
## 2021-12-02 NOTE — Progress Notes (Addendum)
ANTICOAGULATION CONSULT NOTE - Follow Up Consult ? ?Pharmacy Consult for Heparin ?Indication:  portal vein thrombosis ? ?Allergies  ?Allergen Reactions  ? Amitriptyline Other (See Comments)  ?  sleepy  ? Bextra [Valdecoxib] Other (See Comments)  ?  unknown  ? Codeine Nausea And Vomiting  ? Cymbalta [Duloxetine Hcl] Swelling and Other (See Comments)  ?  dizzy  ? Esomeprazole Magnesium Diarrhea  ? Niacin-Lovastatin Er Other (See Comments)  ?  headache  ? Niaspan [Niacin Er] Other (See Comments)  ?  Increased headache  ? Penicillins Rash  ?  Has patient had a PCN reaction causing immediate rash, facial/tongue/throat swelling, SOB or lightheadedness with hypotension: Yes ?Has patient had a PCN reaction causing severe rash involving mucus membranes or skin necrosis: No ?Has patient had a PCN reaction that required hospitalization No ?Has patient had a PCN reaction occurring within the last 10 years: No ?Tolerated Zosyn 06/2016 ?  ? ? ?Patient Measurements: ?Height: '5\' 11"'$  (180.3 cm) ?Weight: 79.4 kg (175 lb) ?IBW/kg (Calculated) : 75.3 ?Heparin Dosing Weight: 79 kg ? ?Vital Signs: ?Temp: 98.1 ?F (36.7 ?C) (05/17 1330) ?Temp Source: Oral (05/17 1330) ?BP: 134/65 (05/17 1330) ?Pulse Rate: 66 (05/17 1330) ? ?Labs: ?Recent Labs  ?  11/30/21 ?1443 12/01/21 ?0540 12/01/21 ?0800 12/01/21 ?0800 12/01/21 ?2011 12/02/21 ?0258 12/02/21 ?1600  ?HGB 9.3* 9.8*  --   --   --  8.7*  --   ?HCT 29.3* 30.5*  --   --   --  26.1*  --   ?PLT 498* 515*  --   --   --  454*  --   ?APTT  --   --  >200*   < > >200* >200* 124*  ?HEPARINUNFRC  --   --  >1.10*  --   --  0.62 0.34  ?CREATININE 0.89 0.92  --   --   --  1.03  --   ? < > = values in this interval not displayed.  ? ? ? ?Estimated Creatinine Clearance: 61.9 mL/min (by C-G formula based on SCr of 1.03 mg/dL). ? ?Assessment: ?81 y.o. male with abdominal pain of about 3 weeks duration. Hospital admission 4/9 to 4/13 for large inguinal hernia repair. Patient taking Eliquis PTA for Atrial  Fibrillation (Last dose reported 5/15 AM). Pharmacy consulted for IV heparin dosing. Will use aPTTs to monitor while heparin levels are falsely elevated due to recent Eliquis doses. ? ?  aPTT 200 >> 124(supratherapeutic) ?HL 0.62 >>  0.34 - therapeutic  ? ?Heparin levels trending down despite elevated APTT. No s/s of bleeding. Hemoglobin down slightly. Antiphospholipid level pending. ? ?Goal of Therapy:  ?Heparin level 0.3-0.7 units/ml ?aPTT 66-102 seconds ?Monitor platelets by anticoagulation protocol: Yes ?  ?Plan:  ?Continue heparin infusion at 750 units/hr. Keeping current rate to see trend. Heparin level almost subtherapeutic. ?Repeat APTT/HL in 6-8 hours and daily. ?Continue to monitor H&H and platelets. ? ? ?Margot Ables, PharmD ?Clinical Pharmacist ?12/02/2021 5:14 PM ? ? ? ? ?
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
## 2021-12-03 DIAGNOSIS — R933 Abnormal findings on diagnostic imaging of other parts of digestive tract: Secondary | ICD-10-CM | POA: Diagnosis not present

## 2021-12-03 DIAGNOSIS — R1084 Generalized abdominal pain: Secondary | ICD-10-CM | POA: Diagnosis not present

## 2021-12-03 DIAGNOSIS — K769 Liver disease, unspecified: Secondary | ICD-10-CM | POA: Diagnosis not present

## 2021-12-03 DIAGNOSIS — K529 Noninfective gastroenteritis and colitis, unspecified: Secondary | ICD-10-CM | POA: Diagnosis not present

## 2021-12-03 DIAGNOSIS — I81 Portal vein thrombosis: Secondary | ICD-10-CM | POA: Diagnosis not present

## 2021-12-03 LAB — RENAL FUNCTION PANEL
Albumin: 1.6 g/dL — ABNORMAL LOW (ref 3.5–5.0)
Anion gap: 6 (ref 5–15)
BUN: 24 mg/dL — ABNORMAL HIGH (ref 8–23)
CO2: 22 mmol/L (ref 22–32)
Calcium: 6.6 mg/dL — ABNORMAL LOW (ref 8.9–10.3)
Chloride: 112 mmol/L — ABNORMAL HIGH (ref 98–111)
Creatinine, Ser: 0.98 mg/dL (ref 0.61–1.24)
GFR, Estimated: >60 mL/min (ref >60)
Glucose, Bld: 122 mg/dL — ABNORMAL HIGH (ref 70–99)
Phosphorus: 2.1 mg/dL — ABNORMAL LOW (ref 2.5–4.6)
Potassium: 3.5 mmol/L (ref 3.5–5.1)
Sodium: 140 mmol/L (ref 135–145)

## 2021-12-03 LAB — HEXAGONAL PHASE PHOSPHOLIPID: Hexagonal Phase Phospholipid: 7 s (ref 0–11)

## 2021-12-03 LAB — DRVVT CONFIRM: dRVVT Confirm: 0.9 ratio (ref 0.8–1.2)

## 2021-12-03 LAB — CBC
HCT: 28.4 % — ABNORMAL LOW (ref 39.0–52.0)
Hemoglobin: 8.8 g/dL — ABNORMAL LOW (ref 13.0–17.0)
MCH: 28.1 pg (ref 26.0–34.0)
MCHC: 31 g/dL (ref 30.0–36.0)
MCV: 90.7 fL (ref 80.0–100.0)
Platelets: 457 10*3/uL — ABNORMAL HIGH (ref 150–400)
RBC: 3.13 MIL/uL — ABNORMAL LOW (ref 4.22–5.81)
RDW: 19 % — ABNORMAL HIGH (ref 11.5–15.5)
WBC: 19.7 10*3/uL — ABNORMAL HIGH (ref 4.0–10.5)
nRBC: 0.2 % (ref 0.0–0.2)

## 2021-12-03 LAB — ANTIPHOSPHOLIPID SYNDROME EVAL, BLD
Anticardiolipin IgA: <9 APL U/mL (ref 0–11)
Anticardiolipin IgG: <9 GPL U/mL (ref 0–14)
Anticardiolipin IgM: <9 MPL U/mL (ref 0–12)
DRVVT: 90.7 s — ABNORMAL HIGH (ref 0.0–47.0)
PTT Lupus Anticoagulant: 63.8 s — ABNORMAL HIGH (ref 0.0–43.5)
Phosphatydalserine, IgA: 4 APS Units (ref 0–19)
Phosphatydalserine, IgG: 11 Units (ref 0–30)
Phosphatydalserine, IgM: 12 Units (ref 0–30)

## 2021-12-03 LAB — HEPATIC FUNCTION PANEL
ALT: 21 U/L (ref 0–44)
AST: 60 U/L — ABNORMAL HIGH (ref 15–41)
Albumin: 1.7 g/dL — ABNORMAL LOW (ref 3.5–5.0)
Alkaline Phosphatase: 327 U/L — ABNORMAL HIGH (ref 38–126)
Bilirubin, Direct: 0.3 mg/dL — ABNORMAL HIGH (ref 0.0–0.2)
Indirect Bilirubin: 0.3 mg/dL (ref 0.3–0.9)
Total Bilirubin: 0.6 mg/dL (ref 0.3–1.2)
Total Protein: 5.9 g/dL — ABNORMAL LOW (ref 6.5–8.1)

## 2021-12-03 LAB — PROTEIN S, ANTIGEN, FREE: Protein S Ag, Free: 96 % (ref 61–136)

## 2021-12-03 LAB — PROTEIN C ACTIVITY: Protein C Activity: 57 % — ABNORMAL LOW (ref 73–180)

## 2021-12-03 LAB — PROTIME-INR
INR: 1.4 — ABNORMAL HIGH (ref 0.8–1.2)
Prothrombin Time: 17.4 seconds — ABNORMAL HIGH (ref 11.4–15.2)

## 2021-12-03 LAB — HEPARIN LEVEL (UNFRACTIONATED)
Heparin Unfractionated: 0.27 IU/mL — ABNORMAL LOW (ref 0.30–0.70)
Heparin Unfractionated: 0.34 IU/mL (ref 0.30–0.70)

## 2021-12-03 LAB — PTT-LA MIX: PTT-LA Mix: 56.3 s — ABNORMAL HIGH (ref 0.0–40.5)

## 2021-12-03 LAB — DRVVT MIX: dRVVT Mix: 51.2 s — ABNORMAL HIGH (ref 0.0–40.4)

## 2021-12-03 MED ORDER — OXYCODONE HCL 5 MG PO TABS
10.0000 mg | ORAL_TABLET | Freq: Four times a day (QID) | ORAL | Status: DC | PRN
  Administered 2021-12-03 (×2): 10 mg via ORAL
  Filled 2021-12-03 (×2): qty 2

## 2021-12-03 MED ORDER — OXYCODONE HCL 5 MG PO TABS
10.0000 mg | ORAL_TABLET | ORAL | Status: DC | PRN
  Administered 2021-12-03 – 2021-12-04 (×2): 10 mg via ORAL
  Filled 2021-12-03 (×2): qty 2

## 2021-12-03 MED ORDER — MORPHINE SULFATE ER 30 MG PO TBCR
30.0000 mg | EXTENDED_RELEASE_TABLET | Freq: Two times a day (BID) | ORAL | Status: DC
  Administered 2021-12-03 – 2021-12-05 (×4): 30 mg via ORAL
  Filled 2021-12-03 (×5): qty 1

## 2021-12-03 NOTE — Progress Notes (Signed)
ANTICOAGULATION CONSULT NOTE - Follow Up Consult  Pharmacy Consult for Heparin Indication:  portal vein thrombosis  Allergies  Allergen Reactions   Amitriptyline Other (See Comments)    sleepy   Bextra [Valdecoxib] Other (See Comments)    unknown   Codeine Nausea And Vomiting   Cymbalta [Duloxetine Hcl] Swelling and Other (See Comments)    dizzy   Esomeprazole Magnesium Diarrhea   Niacin-Lovastatin Er Other (See Comments)    headache   Niaspan [Niacin Er] Other (See Comments)    Increased headache   Penicillins Rash    Has patient had a PCN reaction causing immediate rash, facial/tongue/throat swelling, SOB or lightheadedness with hypotension: Yes Has patient had a PCN reaction causing severe rash involving mucus membranes or skin necrosis: No Has patient had a PCN reaction that required hospitalization No Has patient had a PCN reaction occurring within the last 10 years: No Tolerated Zosyn 06/2016     Patient Measurements: Height: '5\' 11"'$  (180.3 cm) Weight: 79.4 kg (175 lb) IBW/kg (Calculated) : 75.3 Heparin Dosing Weight: 79 kg  Vital Signs: Temp: 97.9 F (36.6 C) (05/18 1346) Temp Source: Oral (05/18 1346) BP: 155/78 (05/18 1346) Pulse Rate: 73 (05/18 1346)  Labs: Recent Labs    12/01/21 0540 12/01/21 0800 12/02/21 0632 12/02/21 1600 12/02/21 2225 12/03/21 0524 12/03/21 1537  HGB 9.8*  --  8.7*  --   --  8.8*  --   HCT 30.5*  --  26.1*  --   --  28.4*  --   PLT 515*  --  454*  --   --  457*  --   APTT  --    < > >200* 124* 121*  --   --   LABPROT  --   --   --   --   --  17.4*  --   INR  --   --   --   --   --  1.4*  --   HEPARINUNFRC  --    < > 0.62 0.34 0.28* 0.27* 0.34  CREATININE 0.92  --  1.03  --   --  0.98  --    < > = values in this interval not displayed.     Estimated Creatinine Clearance: 65.1 mL/min (by C-G formula based on SCr of 0.98 mg/dL).  Assessment: 81 y.o. male with abdominal pain of about 3 weeks duration. Hospital admission 4/9  to 4/13 for large inguinal hernia repair. Patient taking Eliquis PTA for Atrial Fibrillation (Last dose reported 5/15 AM). Pharmacy consulted for IV heparin dosing. Will use aPTTs to monitor while heparin levels are falsely elevated due to recent Eliquis doses.  HL 0.34 >> therapeutic , no bleeding issues For liver BX in AM, heparin to stop at 0600  Antiphospholipid level pending.  Goal of Therapy:  Heparin level 0.3-0.7 units/ml aPTT 66-102 seconds Monitor platelets by anticoagulation protocol: Yes   Plan:  Continue heparin infusion at 1000 units/hr. GI has heparin stopping at 0600 5/19 F/U plan post BX Continue to monitor H&H and platelets.  Isac Sarna, BS Pharm D, BCPS Clinical Pharmacist 12/03/2021 4:20 PM

## 2021-12-03 NOTE — Progress Notes (Signed)
Patient has mobility limitations that impair their ability to do one or more mobility related activities of daily living in customary locations in the home. Patient cannot use crutches, cane or walker to resolve the issue sufficiently, patient can safely self-propel the wheelchair in the home or has a caregiver that can assist.  Yukiko Minnich, Clydene Pugh, LCSW

## 2021-12-03 NOTE — Progress Notes (Signed)
PROGRESS NOTE   Dustin Dominguez  QIW:[id number] DOB: 07/14/1941 DOA: 11/30/2021 PCP: [name], FNP   Chief Complaint  Patient presents with   Abdominal Pain   Weakness   Level of care: Telemetry  Brief Admission History:  81 y.o. male with medical history significant for  HTN, OSA, Atria Fibrillation, PUD, CAD. Patient presented to the ED with complaints of abdominal pain of about 3 weeks duration. Patient was admitted to the hospital about a month ago 4/9 to 4/13, had repair of a large inguinal hernia by general surgeon Dr. Arnoldo Morale, had inguinal hernia repair.  He was also managed for scrotal cellulitis with IV antibiotics and discharged home on Bactrim.   Patient reports abdominal pain started about the time of the hernia, but he attributed it to his peptic ulcer disease which he has a history of.  He went home and did well from a surgical standpoint.  He has had persistent and worsening mostly mid-abdominal pain since onset.  Reports some episodes of diarrhea on average ranging from no bowel movements to 3-4 bowel movements a day.  Reports nausea with no vomiting.  He does not take lactulose listed on medication list   Eliquis was held for surgery about a month ago.  He was discharged home to resume Eliquis  4/13 and has been compliant with his medications since then.  He tells me he has had blood clots in the past from his ???? neck to his back ????.    ED Course: Afebrile temperature 98.7.  Stable vitals.  WBC 19.  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 IV ciprofloxacin and metronidazole started. Hospitalist admit for colitis.  12/01/2021: GI consultation requested, remains on IV heparin infusion.    Assessment and Plan: * Colitis CT  abd/pelvis with contrast suggesting nonspecific colitis in the setting of possible portal vein thrombosis.  Reports nausea without vomiting, and episodes of diarrhea.   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  He is on Eliquis for atrial fibrillation and reports compliance since hospitalization for hernia repair 4/9 - 4/13.  As to etiology, he has no history of liver cirrhosis, CT suggesting liver lesion-metastatic disease not excluded and further evaluation MRI needed.  Unable to rule out if he has actually had blood clots in the past. -IV heparin for now, hold apixaban until he no longer requires any procedure -obtain hematology for recommendations on anticoagulation when ready to transition off IV heparin -Prothrombotic work-up - factor V Leiden, protein C activity and free protein S, Antithrombin III, prothrombin III mutation, factor 8.  Left inguinal hernia Surgical sites looks clean without open wounds or drainage and almost completely healed.  Hx of peptic ulcer CT today also suggesting gastritis.  - continue PPI  -monitor H&H  Chronic pain Restart home dose of MS Contin and Oxycodone - Dilaudid 1 mg every 4 hourly as needed for breakthrough pain  AF (paroxysmal atrial fibrillation) (HCC) -- Continue metoprolol -- apixaban on hold now, IV heparin  for full anticoagulation  Essential hypertension, benign   Continue Imdur, metoprolol  - hold lisinopril for contrast exposure  DVT prophylaxis: IV heparin infusion  Code Status: Full  Family Communication: Discussed with daughter  [name] at bedside disposition: Status is: Inpatient Remains inpatient appropriate because: IV antibiotics and IV heparin required    Consultants:  GI service Pharm D Procedures:   Antimicrobials:  Cetriaxone 5/15>> Metronidazole 5/15>> Subjective:  Patient's daughter [name] at bedside, pain control remains challenging -Appetite is not great  Objective: Vitals:   12/02/21 1330 12/02/21 2137 12/03/21 0448 12/03/21 1346  BP: 134/65 (!) 146/67 (!) 143/64 (!) 155/78  Pulse: 66 69 65 73  Resp:  '17 17 20  '$ Temp: 98.1 F (36.7 C) 98.3 F (36.8 C) 98.3 F (36.8 C) 97.9 F (36.6 C)  TempSrc: Oral Oral Oral Oral  SpO2: 94% 95% 95% 96%  Weight:      Height:        Intake/Output Summary (Last 24 hours) at 12/03/2021 1857 Last data filed at 12/03/2021 1708 Gross per 24 hour  Intake 480 ml  Output 100 ml  Net 380 ml   Filed Weights   11/30/21 1127  Weight: 79.4 kg   Examination:   Physical Exam  Gen:- Awake Alert, in no acute distress  HEENT:- [place].AT, No sclera icterus Neck-Supple Neck,No JVD,.  Lungs-  CTAB , fair air movement bilaterally  CV- S1, S2 normal, RRR Abd-  +ve B.Sounds, Abd Soft, generalized tenderness especially in right upper quadrant, swelling/abdominal wall deformity especially on the right apparently this is not new as per patient and daughter Extremity/Skin:- No  edema,   good pedal pulses  Psych-affect is appropriate, oriented x3 Neuro-generalized weakness, no new focal deficits, no tremors   Data Reviewed: I have personally reviewed following labs and imaging studies  CBC: Recent Labs  Lab 11/30/21 1443 12/01/21 0540 12/02/21 0632 12/03/21 0524  WBC 19.0* 19.5* 17.8* 19.7*  NEUTROABS 13.3*  --   --   --   HGB 9.3* 9.8* 8.7* 8.8*  HCT 29.3* 30.5* 26.1* 28.4*  MCV 89.3 91.9 88.8 90.7  PLT 498* 515* 454* 457*    Basic Metabolic Panel: Recent Labs  Lab 11/30/21 1443 12/01/21 0540 12/02/21 0632 12/03/21 0524  NA 142 140 139 140  K 4.0  3.8 3.7 3.5  CL 113* 113* 113* 112*  CO2 24 20* 23 22  GLUCOSE 90 76 121* 122*  BUN 19 18 24* 24*  CREATININE 0.89 0.92 1.03 0.98  CALCIUM 6.4* 6.5* 6.4* 6.6*  MG 0.7*  --  1.2*  --   PHOS  --   --   --  2.1*    CBG: No results for input(s): GLUCAP in the last 168 hours.  No results found for this or any previous visit (from the past 240 hour(s)).   Radiology Studies: MR LIVER W WO CONTRAST  Result Date: 12/02/2021 CLINICAL DATA:  Portal vein thrombosis. Indeterminate hepatic lesions. EXAM: MRI ABDOMEN WITHOUT AND WITH CONTRAST TECHNIQUE: Multiplanar multisequence MR imaging of the abdomen was performed both before and after the administration of intravenous contrast. CONTRAST:  7.59m GADAVIST GADOBUTROL 1 MMOL/ML IV SOLN COMPARISON:  CT 11/30/2021 FINDINGS: Lower chest:  Bilateral dependent pleural effusions. Hepatobiliary: Multiple round lesions in the RIGHT hepatic lobe which are hyperintense on T2 weighted imaging. For example 2 cm lesion on image 15/4. 2.1 cm lesion on 19/4. 2.8 cm on image 11/4. There is expansion of the RIGHT portal vein to 2.1 cm (  image 20/4). There is an oblong filling defect within this expanded vein measuring 6 cm in length (image 20/4). Precontrast T1 weighted imaging demonstrates multiple hypodense lesions corresponding to the T2 hyperintense lesion described above. In addition there multiple small lesions in the inferior RIGHT hepatic lobe too numerous to count (image 50/series 11). There is expansile lesion in the inferior RIGHT hepatic lobe measuring 5 cm x 5.6 cm (image 67/series 11). Postcontrast imaging demonstrates hypoenhancement of the above lesions. Additionally there several lesions in LEFT hepatic lobe with similar characteristics. Minimal enhancement of the expansile lesion within the RIGHT portal vein. LEFT portal veins patent Pancreas: Are Spleen: Normal spleen. Benign hemorrhagic cyst of the LEFT kidney. No follow-up recommended Adrenals/urinary tract:  Adrenal glands and kidneys are normal. Stomach/Bowel: Stomach and limited of the small bowel is unremarkable. Moderate volume intraperitoneal free fluid surrounding the bowel. Vascular/Lymphatic: Abdominal aortic normal caliber. No retroperitoneal periportal lymphadenopathy. Musculoskeletal: No aggressive osseous lesion IMPRESSION: 1. Multiple hypoenhancing lesions in the RIGHT hepatic lobe are most consistent with malignancy. High concern for metastatic disease. Multifocal hepatocellular carcinoma is also considered. Hepatic abscesses are less favored. Lesions also involve LEFT hepatic lobe to a lesser degree. 2. 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 3. Small volume ascites. Electronically Signed   By: Suzy Bouchard M.D.   On: 12/02/2021 13:47    Scheduled Meds:  calcium carbonate  400 mg of elemental calcium Oral BID WC   feeding supplement  1 Container Oral TID BM   isosorbide mononitrate  30 mg Oral Daily   metoprolol tartrate  25 mg Oral BID   morphine  30 mg Oral Q12H   pantoprazole (PROTONIX) IV  40 mg Intravenous Q12H   Continuous Infusions:  sodium chloride 10 mL/hr at 12/02/21 0943   cefTRIAXone (ROCEPHIN)  IV 2 g (12/03/21 0836)   heparin 1,000 Units/hr (12/03/21 0831)   metronidazole 500 mg (12/03/21 1124)    LOS: 3 days   Roxan Hockey, MD How to contact the [facility] Attending or Consulting provider [place] or covering provider during after hours [place], for this patient?  Check the care team in [facility] and look for a) attending/consulting [facility] provider listed and b) the [facility] team listed Log into [email or web address] and use [place] universal password to access. If you do not have the password, please contact the hospital operator. Locate the [facility] provider you are looking for under [facility] and page to a number that you can be directly reached. If you still have difficulty reaching the provider, please page the [facility] (Director on Call)  for the Hospitalists listed on amion for assistance.  12/03/2021, 6:57 PM

## 2021-12-03 NOTE — Progress Notes (Signed)
Subjective: Epigastric abdominal pain every time he eats or drinks anything. Gets full quickly. Can only eat a little at a time. No nausea or vomiting. BM this morning around 4 am, loose but not watery. No brbpr or melena. No confusion or disorientation. Abdominal distension is slightly worse today, primarily in mid and right abdomen.   Objective: Vital signs in last 24 hours: Temp:  [97.9 F (36.6 C)-98.3 F (36.8 C)] 97.9 F (36.6 C) (05/18 1346) Pulse Rate:  [65-73] 73 (05/18 1346) Resp:  [17-20] 20 (05/18 1346) BP: (143-155)/(64-78) 155/78 (05/18 1346) SpO2:  [95 %-96 %] 96 % (05/18 1346) Last BM Date : 12/03/21 General:   Alert and oriented, pleasant Head:  Normocephalic and atraumatic. Eyes:  No icterus, sclera clear. Conjuctiva pink.  Abdomen: Bowel sounds present. Right abdomen is markedly distended, slightly worse compared to 2 days ago with some pitting edema, left abdomen without significant distension. Mild to moderate TTP in RLQ, mild TTP in epigastric and RUQ region.   Msk:  Symmetrical without gross deformities. Normal posture. Extremities:  Without edema. Neurologic:  Alert and  oriented x4;  grossly normal neurologically. Skin:  Warm and dry, intact without significant lesions.  Psych:  Normal mood and affect.  Intake/Output from previous day: 05/17 0701 - 05/18 0700 In: 585 [P.O.:240; I.V.:145; IV Piggyback:200] Out: 250 [Urine:250] Intake/Output this shift: Total I/O In: 240 [P.O.:240] Out: -   Lab Results: Recent Labs    12/01/21 0540 12/02/21 0632 12/03/21 0524  WBC 19.5* 17.8* 19.7*  HGB 9.8* 8.7* 8.8*  HCT 30.5* 26.1* 28.4*  PLT 515* 454* 457*   BMET Recent Labs    12/01/21 0540 12/02/21 0632 12/03/21 0524  NA 140 139 140  K 3.8 3.7 3.5  CL 113* 113* 112*  CO2 20* 23 22  GLUCOSE 76 121* 122*  BUN 18 24* 24*  CREATININE 0.92 1.03 0.98  CALCIUM 6.5* 6.4* 6.6*   LFT Recent Labs    12/01/21 0800 12/03/21 0524  PROT 5.8*  --    ALBUMIN 1.8* 1.6*  AST 68*  --   ALT 19  --   ALKPHOS 287*  --   BILITOT 1.2  --   BILIDIR 0.6*  --   IBILI 0.6  --    PT/INR Recent Labs    12/03/21 0524  LABPROT 17.4*  INR 1.4*    Studies/Results: MR LIVER W WO CONTRAST  Result Date: 12/02/2021 CLINICAL DATA:  Portal vein thrombosis. Indeterminate hepatic lesions. EXAM: MRI ABDOMEN WITHOUT AND WITH CONTRAST TECHNIQUE: Multiplanar multisequence MR imaging of the abdomen was performed both before and after the administration of intravenous contrast. CONTRAST:  7.66m GADAVIST GADOBUTROL 1 MMOL/ML IV SOLN COMPARISON:  CT 11/30/2021 FINDINGS: Lower chest:  Bilateral dependent pleural effusions. Hepatobiliary: Multiple round lesions in the RIGHT hepatic lobe which are hyperintense on T2 weighted imaging. For example 2 cm lesion on image 15/4. 2.1 cm lesion on 19/4. 2.8 cm on image 11/4. There is expansion of the RIGHT portal vein to 2.1 cm (image 20/4). There is an oblong filling defect within this expanded vein measuring 6 cm in length (image 20/4). Precontrast T1 weighted imaging demonstrates multiple hypodense lesions corresponding to the T2 hyperintense lesion described above. In addition there multiple small lesions in the inferior RIGHT hepatic lobe too numerous to count (image 50/series 11). There is expansile lesion in the inferior RIGHT hepatic lobe measuring 5 cm x 5.6 cm (image 67/series 11). Postcontrast imaging demonstrates hypoenhancement of the  above lesions. Additionally there several lesions in LEFT hepatic lobe with similar characteristics. Minimal enhancement of the expansile lesion within the RIGHT portal vein. LEFT portal veins patent Pancreas: Are Spleen: Normal spleen. Benign hemorrhagic cyst of the LEFT kidney. No follow-up recommended Adrenals/urinary tract: Adrenal glands and kidneys are normal. Stomach/Bowel: Stomach and limited of the small bowel is unremarkable. Moderate volume intraperitoneal free fluid surrounding the  bowel. Vascular/Lymphatic: Abdominal aortic normal caliber. No retroperitoneal periportal lymphadenopathy. Musculoskeletal: No aggressive osseous lesion IMPRESSION: 1. Multiple hypoenhancing lesions in the RIGHT hepatic lobe are most consistent with malignancy. High concern for metastatic disease. Multifocal hepatocellular carcinoma is also considered. Hepatic abscesses are less favored. Lesions also involve LEFT hepatic lobe to a lesser degree. 2. 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 3. Small volume ascites. Electronically Signed   By: Suzy Bouchard M.D.   On: 12/02/2021 13:47    Assessment: 81 y.o. male with medical history significant for HTN, OSA, atrial fibrillation on Eliquis, CAD with history of PCI and CABG, diabetes, stroke, PUD, delayed gastric emptying, esophageal dysmotility, GERD, tubular adenoma of the colon, who presented to the emergency room with chief complaint of mid to upper abdominal pain x1 month, nausea, and intermittent diarrhea.  Vital signs were stable.  He was found to have leukocytosis, elevated liver enzymes, indeterminate 4 cm hepatic lesion in the right lobe, findings concerning for right portal vein thrombosis, ascending colon wall thickening with small surrounding lymph node, enlarged lymph node adjacent to ascending colon measuring 12 mm, and unable to exclude gastric wall thickening.  He was started on IV antibiotics, PPI twice daily, and IV heparin.  Prothrombotic work-up initiated by hospitalist.  GI consulted for further evaluation of portal vein thrombosis and liver lesion.  Hepatic cirrhosis/Liver lesion:  New indeterminate 4 cm hepatic lesion in the inferior right lobe with inability to exclude metastatic disease on CT A/P on 11/30/21. Associated new onset elevated liver enzymes.  LFTs previously within normal limits in February 2023.  US doppler suggested cirrhosis with nodular hepatic contour, unclear if this is  appearance is secondary to multiple hepatic lesions vs cirrhosis. Spleen is normal. Denies alcohol, illicit drug use. Takes close to 2 g of Tylenol daily included in his chronic pain medication.   MRI liver with and without contrast 5/17 with multiple hypoenhancing lesions in the right hepatic lobe and lesions involving left hepatic lobe to a lesser degree, most consistent with malignancy, high concern for metastatic disease versus multifocal hepatocellular carcinoma, PVT is favored to be tumor thrombus, also with small volume ascites.  AFP elevated at 14.5, non-specific. There was no clear evidence of other primary malignant lesion on imaging though CT did show ascending colon wall thickening with large adjacent lymph note and inability to exclude gastric wall thickening. He is scheduled to undergo liver biopsy with IR tomorrow. His anticoagulation will need to be interrupted for this. Further recommendations to follow.   From a cirrhosis standpoint, he has no hepatic encephalopathy, no peripheral edema. His right abdomen is a bit more distended today compared to when I saw him a couple days ago with mild pitting edema. Left abdomen without distension. If distension worsens, will need to consider repeat US to evaluate degree of ascites for consideration of paracentesis.  PVT: New portal vein thrombus noted on CT A/P with contrast.  Ultrasound Doppler confirm portal vein thrombus and he has been on heparin since admission.  MRI yesterday suggests that this is  actually a tumor thrombus.   Abd Pain/gastric wall thickening/ascending colon wall thickening: Periumbilical, epigastric, right sided abd pain x1 month. GERD well controlled with recent PPI change, no NSAID, BRBPR or melena. Hx of PUD, denies NSAIDs. Abdominal pain is likely multifactorial with new liver lesions concerning for malignancy/metastatic disease, portal vein thrombosis, and inability to exclude gastritis, PUD, gastric, or colonic malignancy  in the setting of possible gastric wall thickening as well as ascending colon wall thickening noted on imaging this admission. He was started on empiric antibiotics on admission, but clinically doesn't fit the picture of infectious colitis. WBC count is elevated, possibly secondary to underlying malignant process. He will need EGD and Colonoscopy at a later date for further evaluation. Could consider at least an EGD this admission. Patient states he doesn't think he would tolerate a colon prep at this time due to epigastric abdominal pain. Eliquis has been on hold since admission. Will continue PPI BID.    Plan: Recheck HFP today.  Proceed with liver biopsy at [facility] tomorrow. Heparin will need to be d/c'd at 6 am.  [place] to evaluate degree of ascites for consideration of paracentesis if worsening abdominal distension.  TCS and EGD, timing to be determined.  Continue PPI BID.  Complete course of empiric antibiotics.  Consider stool studies if recurrent diarrhea due to ascending colon wall thickening.  Continue to monitor daily MELD labs Monitor for signs of HE    LOS: 3 days    12/03/2021, 3:10 PM   Aliene Altes, [facility] Gastroenterology
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
## 2021-12-03 NOTE — TOC Initial Note (Signed)
Transition of Care [facility]) - Initial/Assessment Note    Patient Details  Name: Dustin Dominguez MRN: [id number] Date of Birth: 04-Feb-1941  Transition of Care [facility]) CM/SW Contact:    Ihor Gully, LCSW Phone Number: 12/03/2021, 3:23 PM  Clinical Narrative:                 Patient from home. Daughter, Ms. Smalls, has been living with him 24/7 since Easter. Prior to that she was there during the day only. At baseline, patient drives, and requires some assistance with ADLs which daughter assists as needed. No DME in the home. Requests rollator and wheelchair. No DME company preference.  Attending aware of DME orders needed.   Expected Discharge Plan: [place] Barriers to Discharge: Continued Medical Work up   Patient Goals and CMS Choice        Expected Discharge Plan and Services Expected Discharge Plan: [place]       Living arrangements for the past 2 months: Single Family Home                 DME Arranged: Walker rolling with seat, Wheelchair manual DME Agency: [employer] Date DME Agency Contacted: 12/03/21 Time DME Agency Contacted: [phone] Representative spoke with at DME Agency: Caryl Pina            Prior Living Arrangements/Services Living arrangements for the past 2 months: [place] with:: Adult Children Patient language and need for interpreter reviewed:: Yes        Need for Family Participation in Patient Care: Yes (Comment) Care giver support system in place?: Yes (comment)   Criminal Activity/Legal Involvement Pertinent to Current Situation/Hospitalization: No - Comment as needed  Activities of Daily Living Home Assistive Devices/Equipment: None ADL Screening (condition at time of admission) Patient's cognitive ability adequate to safely complete daily activities?: Yes Is the patient deaf or have difficulty hearing?: No Does the patient have difficulty seeing, even when wearing glasses/contacts?:  No Does the patient have difficulty concentrating, remembering, or making decisions?: No Patient able to express need for assistance with ADLs?: Yes Does the patient have difficulty dressing or bathing?: No Independently performs ADLs?: No Communication: Independent Dressing (OT): Needs assistance Is this a change from baseline?: Change from baseline, expected to last <3days Grooming: Needs assistance Is this a change from baseline?: Change from baseline, expected to last <3 days Feeding: Independent Bathing: Needs assistance Is this a change from baseline?: Change from baseline, expected to last <3 days Toileting: Needs assistance Is this a change from baseline?: Change from baseline, expected to last <3 days In/Out Bed: Needs assistance Is this a change from baseline?: Change from baseline, expected to last <3 days Walks in Home: Independent Does the patient have difficulty walking or climbing stairs?: No Weakness of Legs: Both Weakness of Arms/Hands: Both  Permission Sought/Granted Permission sought to share information with : Family Supports    Share Information with NAME: Delaine Lame, daughter           Emotional Assessment     Affect (typically observed): Appropriate Orientation: : Oriented to Self, Oriented to Place, Oriented to  Time, Oriented to Situation Alcohol / Substance Use: Not Applicable Psych Involvement: No (comment)  Admission diagnosis:  Colitis [K52.9] Patient Active Problem List   Diagnosis Date Noted   Malnutrition of moderate degree 12/02/2021   Abnormal CT scan, colon    Abnormal CT scan, stomach    Colitis 11/30/2021  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/30/2021   Hx of peptic ulcer 11/02/2021   Lesion of liver greater than 1 cm in diameter 11/02/2021   Abdominal pain, epigastric 11/02/2021   Scrotal edema 10/26/2021   Cellulitis of scrotum 10/26/2021   Left inguinal hernia    Esophageal dysphagia 08/20/2021   Back pain without sciatica  02/24/2021   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   Prediabetes 01/06/2021   Shortness of breath 11/19/2019   Vestibular neuritis 07/08/2018   Chronic anemia 06/30/2018   AF (paroxysmal atrial fibrillation) ([place]) 06/30/2018   Chronic pain 06/30/2018   Chronic constipation 06/30/2018   Long-term current use of opiate analgesic 09/14/2017   Abdominal aortic atherosclerosis ([place]) 08/04/2016   Generalized abdominal pain 07/04/2016   Atherosclerosis of native coronary artery of native heart with angina pectoris ([place])    Leukocytosis 07/06/2015   Gastroesophageal reflux disease 06/13/2013   Vitamin D deficiency 06/13/2013   Degenerative disc disease, cervical 06/13/2013   Degenerative disc disease, lumbar 06/13/2013   Multiple thyroid nodules 04/07/2012   HLD (hyperlipidemia) 01/27/2011   Sleep apnea    Tobacco use disorder    Essential hypertension, benign    Claudication in peripheral vascular disease ([place])    PCP:  [name], FNP Pharmacy:   [place], [place] [address] [place] [address] Phone: [phone] Fax: [phone]     Social Determinants of Health (SDOH) Interventions    Readmission Risk Interventions     View : No data to display.

## 2021-12-03 NOTE — Progress Notes (Signed)
ANTICOAGULATION CONSULT NOTE - Follow Up Consult  Pharmacy Consult for Heparin Indication:  portal vein thrombosis  Allergies  Allergen Reactions   Amitriptyline Other (See Comments)    sleepy   Bextra [Valdecoxib] Other (See Comments)    unknown   Codeine Nausea And Vomiting   Cymbalta [Duloxetine Hcl] Swelling and Other (See Comments)    dizzy   Esomeprazole Magnesium Diarrhea   Niacin-Lovastatin Er Other (See Comments)    headache   Niaspan [Niacin Er] Other (See Comments)    Increased headache   Penicillins Rash    Has patient had a PCN reaction causing immediate rash, facial/tongue/throat swelling, SOB or lightheadedness with hypotension: Yes Has patient had a PCN reaction causing severe rash involving mucus membranes or skin necrosis: No Has patient had a PCN reaction that required hospitalization No Has patient had a PCN reaction occurring within the last 10 years: No Tolerated Zosyn 06/2016     Patient Measurements: Height: '5\' 11"'$  (180.3 cm) Weight: 79.4 kg (175 lb) IBW/kg (Calculated) : 75.3 Heparin Dosing Weight: 79 kg  Vital Signs: Temp: 98.3 F (36.8 C) (05/18 0448) Temp Source: Oral (05/18 0448) BP: 143/64 (05/18 0448) Pulse Rate: 65 (05/18 0448)  Labs: Recent Labs    12/01/21 0540 12/01/21 0800 12/02/21 0632 12/02/21 1600 12/02/21 2225 12/03/21 0524  HGB 9.8*  --  8.7*  --   --  8.8*  HCT 30.5*  --  26.1*  --   --  28.4*  PLT 515*  --  454*  --   --  457*  APTT  --    < > >200* 124* 121*  --   HEPARINUNFRC  --    < > 0.62 0.34 0.28* 0.27*  CREATININE 0.92  --  1.03  --   --  0.98   < > = values in this interval not displayed.     Estimated Creatinine Clearance: 65.1 mL/min (by C-G formula based on SCr of 0.98 mg/dL).  Assessment: 81 y.o. male with abdominal pain of about 3 weeks duration. Hospital admission 4/9 to 4/13 for large inguinal hernia repair. Patient taking Eliquis PTA for Atrial Fibrillation (Last dose reported 5/15 AM). Pharmacy  consulted for IV heparin dosing. Will use aPTTs to monitor while heparin levels are falsely elevated due to recent Eliquis doses.  HL 0.27 >> subtherapeutic , no bleeding issues   Antiphospholipid level pending.  Goal of Therapy:  Heparin level 0.3-0.7 units/ml aPTT 66-102 seconds Monitor platelets by anticoagulation protocol: Yes   Plan:  Increase heparin infusion at 1000 units/hr.  Repeat HL in 6-8 hours and daily. Continue to monitor H&H and platelets.  Isac Sarna, BS Pharm D, BCPS Clinical Pharmacist 12/03/2021 8:11 AM

## 2021-12-04 ENCOUNTER — Other Ambulatory Visit: Payer: Self-pay

## 2021-12-04 ENCOUNTER — Other Ambulatory Visit (HOSPITAL_COMMUNITY): Payer: Medicare HMO

## 2021-12-04 ENCOUNTER — Inpatient Hospital Stay (HOSPITAL_COMMUNITY): Payer: Medicare HMO

## 2021-12-04 ENCOUNTER — Ambulatory Visit (HOSPITAL_COMMUNITY): Admission: RE | Admit: 2021-12-04 | Payer: Medicare HMO | Source: Ambulatory Visit

## 2021-12-04 ENCOUNTER — Inpatient Hospital Stay (HOSPITAL_COMMUNITY)
Admit: 2021-12-04 | Discharge: 2021-12-04 | Disposition: A | Discharge status: Home / Self Care | Payer: Medicare HMO | Attending: Family Medicine | Admitting: Family Medicine

## 2021-12-04 DIAGNOSIS — R1084 Generalized abdominal pain: Secondary | ICD-10-CM | POA: Diagnosis not present

## 2021-12-04 DIAGNOSIS — K3189 Other diseases of stomach and duodenum: Secondary | ICD-10-CM | POA: Diagnosis not present

## 2021-12-04 DIAGNOSIS — K766 Portal hypertension: Secondary | ICD-10-CM | POA: Diagnosis not present

## 2021-12-04 DIAGNOSIS — K529 Noninfective gastroenteritis and colitis, unspecified: Secondary | ICD-10-CM | POA: Diagnosis not present

## 2021-12-04 LAB — FACTOR 5 LEIDEN

## 2021-12-04 LAB — CBC
HCT: 26.3 % — ABNORMAL LOW (ref 39.0–52.0)
Hemoglobin: 8.8 g/dL — ABNORMAL LOW (ref 13.0–17.0)
MCH: 29.8 pg (ref 26.0–34.0)
MCHC: 33.5 g/dL (ref 30.0–36.0)
MCV: 89.2 fL (ref 80.0–100.0)
Platelets: 467 10*3/uL — ABNORMAL HIGH (ref 150–400)
RBC: 2.95 MIL/uL — ABNORMAL LOW (ref 4.22–5.81)
RDW: 19.5 % — ABNORMAL HIGH (ref 11.5–15.5)
WBC: 22.6 10*3/uL — ABNORMAL HIGH (ref 4.0–10.5)
nRBC: 0.1 % (ref 0.0–0.2)

## 2021-12-04 LAB — HEPARIN LEVEL (UNFRACTIONATED): Heparin Unfractionated: 0.12 IU/mL — ABNORMAL LOW (ref 0.30–0.70)

## 2021-12-04 MED ORDER — FENTANYL CITRATE (PF) 100 MCG/2ML IJ SOLN
INTRAMUSCULAR | Status: AC | PRN
  Administered 2021-12-04 (×2): 25 ug via INTRAVENOUS

## 2021-12-04 MED ORDER — FENTANYL CITRATE (PF) 100 MCG/2ML IJ SOLN
INTRAMUSCULAR | Status: AC
  Filled 2021-12-04: qty 2

## 2021-12-04 MED ORDER — GELATIN ABSORBABLE 12-7 MM EX MISC
CUTANEOUS | Status: AC
  Filled 2021-12-04: qty 1

## 2021-12-04 MED ORDER — MIDAZOLAM HCL 2 MG/2ML IJ SOLN
INTRAMUSCULAR | Status: AC
  Filled 2021-12-04: qty 2

## 2021-12-04 MED ORDER — MIDAZOLAM HCL 2 MG/2ML IJ SOLN
INTRAMUSCULAR | Status: AC | PRN
  Administered 2021-12-04 (×2): .5 mg via INTRAVENOUS

## 2021-12-04 MED ORDER — HEPARIN (PORCINE) 25000 UT/250ML-% IV SOLN
1150.0000 [IU]/h | INTRAVENOUS | Status: AC
  Administered 2021-12-04 (×2): 1000 [IU]/h via INTRAVENOUS
  Filled 2021-12-04: qty 250

## 2021-12-04 MED ORDER — LIDOCAINE HCL (PF) 1 % IJ SOLN
INTRAMUSCULAR | Status: AC
  Filled 2021-12-04: qty 30

## 2021-12-04 NOTE — TOC Progression Note (Signed)
Transition of Care [facility]) - Progression Note    Patient Details  Name: Dustin Dominguez MRN: [id number] Date of Birth: [birth year]  Transition of Care [facility]) CM/SW Contact  Joaquin Courts, RN Phone Number: 12/04/2021, 2:47 PM  Clinical Narrative:    CM outreached to [employer] rep Caryl Pina and notified that DME orders for rollator and wheelchair have been placed.  [employer] will drop ship the equipment to patients home.  Per MD, patient could be medically stable for discharge on Saturday 5/20.   Expected Discharge Plan: [place] Barriers to Discharge: Continued Medical Work up  Expected Discharge Plan and Services Expected Discharge Plan: [place] arrangements for the past 2 months: Single Family Home                 DME Arranged: Walker rolling with seat, Wheelchair manual DME Agency: [employer] Date DME Agency Contacted: 12/03/21 Time DME Agency Contacted: [phone] Representative spoke with at DME Agency: Caryl Pina             Social Determinants of Health ([place]) Interventions    Readmission Risk Interventions     View : No data to display.

## 2021-12-04 NOTE — Progress Notes (Signed)
Patient underwent ultrasound-guided liver biopsy earlier today at [facility] without any problems. Patient states she is not having any abdominal pain at the present time. Patient's daughter is at bedside and wondering when biopsy results would be available.  It probably would be on Monday unless he needs special studies. Will resume anticoagulant in AM.

## 2021-12-04 NOTE — Progress Notes (Addendum)
PROGRESS NOTE   Dustin Dominguez  NWG:[id number] DOB: 10-17-1940 DOA: 11/30/2021 PCP: [name], FNP   Chief Complaint  Patient presents with   Abdominal Pain   Weakness   Level of care: Telemetry  Brief Admission History:  81 y.o. male with medical history significant for  HTN, OSA, Atria Fibrillation, PUD, CAD. Patient presented to the ED with complaints of abdominal pain of about 3 weeks duration. Patient was admitted to the hospital about a month ago 4/9 to 4/13, had repair of a large inguinal hernia by general surgeon Dr. Arnoldo Morale, had inguinal hernia repair.  He was also managed for scrotal cellulitis with IV antibiotics and discharged home on Bactrim.   Patient reports abdominal pain started about the time of the hernia, but he attributed it to his peptic ulcer disease which he has a history of.  He went home and did well from a surgical standpoint.  He has had persistent and worsening mostly mid-abdominal pain since onset.  Reports some episodes of diarrhea on average ranging from no bowel movements to 3-4 bowel movements a day.  Reports nausea with no vomiting.  He does not take lactulose listed on medication list   Eliquis was held for surgery about a month ago.  He was discharged home to resume Eliquis  4/13 and has been compliant with his medications since then.  He tells me he has had blood clots in the past from his ???? neck to his back ????.    ED Course: Afebrile temperature 98.7.  Stable vitals.  WBC 19.  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 IV ciprofloxacin and metronidazole started. Hospitalist admit for colitis.  12/01/2021: GI consultation requested, remains on IV heparin infusion.    Assessment and Plan: * Colitis CT  abd/pelvis with contrast suggesting nonspecific colitis in the setting of possible portal vein thrombosis. 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  He is on Eliquis for atrial fibrillation and reports compliance since hospitalization for hernia repair 4/9 - 4/13.  As to etiology, he has no history of liver cirrhosis, CT suggesting liver lesion-metastatic disease not excluded and further evaluation MRI needed.  Unable to rule out if he has actually had blood clots in the past. ---Prothrombotic work-up - factor V Leiden, protein C activity and free protein S, Antithrombin III, prothrombin III mutation, factor 8. -As per IR physician and Dr. Laural Golden okay to restart IV heparin on 12/04/2021 after liver biopsy -Anticipate transition to Eliquis in a.m. if no bleeding concerns  Left inguinal hernia Surgical sites looks clean without open wounds or drainage and almost completely healed.  Hx of peptic ulcer CT today also suggesting gastritis.  - continue PPI  -monitor H&H  Chronic pain  continue home dose of MS Contin and Oxycodone - Dilaudid 1 mg every 4 hourly as needed for breakthrough pain  AF (paroxysmal atrial fibrillation) (HCC) -- Continue metoprolol -- apixaban on hold now, IV heparin for full anticoagulation as above  Essential hypertension,  benign  Continue Imdur, metoprolol  - hold lisinopril for contrast exposure  Social/ethics--- patient and daughter having further conversations about possible de-escalation of care Versus oncology consult for possible chemo/radiation  treatments  DVT prophylaxis: IV heparin infusion  Code Status: Full  Family Communication: Discussed with daughter [name] at bedside disposition: Status is: Inpatient Remains inpatient appropriate because: IV antibiotics and IV heparin required    Consultants:  GI service Pharm D Procedures:   Antimicrobials:  Cetriaxone 5/15>> Metronidazole 5/15>> Subjective:  Patient's daughter [name] at bedside,  -Tolerated liver biopsy well -Right-sided anterior abdominal wall postbiopsy wound with serous oozing - - Objective: Vitals:   12/04/21 1115 12/04/21 1130 12/04/21 1145 12/04/21 1258  BP: (!) 142/57 (!) 140/57 127/61 (!) 165/70  Pulse: 66 63 68 66  Resp: '19 12 12 18  '$ Temp:    98.1 F (36.7 C)  TempSrc:    Oral  SpO2: 91% 92% 92% 94%  Weight:      Height:        Intake/Output Summary (Last 24 hours) at 12/04/2021 1741 Last data filed at 12/04/2021 1707 Gross per 24 hour  Intake 827.6 ml  Output 700 ml  Net 127.6 ml   Filed Weights   11/30/21 1127  Weight: 79.4 kg   Examination:  Physical Exam  Gen:- Awake Alert, in no acute distress , chronically ill-appearing HEENT:- [place].AT, No sclera icterus Neck-Supple Neck,No JVD,.  Lungs-  CTAB , fair air movement bilaterally  CV- S1, S2 normal, RRR Abd-  +ve B.Sounds, Abd Soft, generalized tenderness especially in right upper quadrant, swelling/abdominal wall deformity especially on the right apparently this is not new as per patient and daughter -Right anterior abdominal wall with oozing of serous fluid from biopsy site Extremity/Skin:- No  edema,   good pedal pulses  Psych-affect is appropriate, oriented x3 Neuro-generalized weakness, no new focal deficits, no tremors   Data Reviewed: I have personally reviewed following labs and imaging studies  CBC: Recent Labs  Lab 11/30/21 1443 12/01/21 0540 12/02/21 0632 12/03/21 0524 12/04/21 0451  WBC 19.0* 19.5* 17.8* 19.7* 22.6*  NEUTROABS 13.3*  --   --   --   --    HGB 9.3* 9.8* 8.7* 8.8* 8.8*  HCT 29.3* 30.5* 26.1* 28.4* 26.3*  MCV 89.3 91.9 88.8 90.7 89.2  PLT 498* 515* 454* 457* 467*    Basic Metabolic Panel: Recent Labs  Lab 11/30/21 1443 12/01/21 0540 12/02/21 0632 12/03/21 0524  NA 142 140 139 140  K 4.0 3.8 3.7 3.5  CL 113* 113* 113* 112*  CO2 24 20* 23 22  GLUCOSE 90 76 121* 122*  BUN 19 18 24* 24*  CREATININE 0.89 0.92 1.03 0.98  CALCIUM 6.4* 6.5* 6.4* 6.6*  MG 0.7*  --  1.2*  --   PHOS  --   --   --  2.1*    CBG: No results for input(s): GLUCAP in the last 168 hours.  No results found for this or any previous visit (from the past 240 hour(s)).   Radiology Studies: US BIOPSY (LIVER)  Result Date: 12/04/2021 INDICATION: 81 year old male with indeterminate multifocal liver masses and portal vein thrombosis. EXAM: ULTRASOUND GUIDED LIVER LESION BIOPSY COMPARISON:  None Available. MEDICATIONS: None ANESTHESIA/SEDATION: Fentanyl 50 mcg IV; Versed 1 mg IV Total Moderate Sedation time:  7 minutes. The patient's level of consciousness and vital signs were monitored continuously by radiology nursing throughout the procedure under my direct supervision. COMPLICATIONS: None immediate. PROCEDURE: Informed written consent was  obtained from the patient after a discussion of the risks, benefits and alternatives to treatment. The patient understands and consents the procedure. A timeout was performed prior to the initiation of the procedure. Ultrasound scanning was performed of the right upper abdominal quadrant demonstrates a hypoechoic mass in the inferior right lobe of the liver measuring up to approximately 4.8 cm compatible with findings on recent cross-sectional imaging. The right inferior liver mass was selected for biopsy and the procedure was planned. The right upper abdominal quadrant was prepped and draped in the usual sterile fashion. The overlying soft tissues were anesthetized with 1% lidocaine with epinephrine. A 17 gauge, 6.8 cm  co-axial needle was advanced into a peripheral aspect of the lesion. This was followed by total of 3 core biopsies with an 18 gauge core device under direct ultrasound guidance. The coaxial needle tract was embolized with a small amount of Gel-Foam slurry and superficial hemostasis was obtained with manual compression. Post procedural scanning was negative for definitive area of hemorrhage or additional complication. A dressing was placed. The patient tolerated the procedure well without immediate post procedural complication. IMPRESSION: Technically successful ultrasound guided core needle biopsy of right inferior liver mass. Ruthann Cancer, MD [facility] Radiology Electronically Signed   By: Ruthann Cancer M.D.   On: 12/04/2021 12:01    Scheduled Meds:  calcium carbonate  400 mg of elemental calcium Oral BID WC   feeding supplement  1 Container Oral TID BM   gelatin adsorbable       isosorbide mononitrate  30 mg Oral Daily   lidocaine (PF)       metoprolol tartrate  25 mg Oral BID   morphine  30 mg Oral Q12H   pantoprazole (PROTONIX) IV  40 mg Intravenous Q12H   Continuous Infusions:  sodium chloride 10 mL/hr at 12/02/21 0943   cefTRIAXone (ROCEPHIN)  IV 2 g (12/03/21 0836)   heparin 1,000 Units/hr (12/04/21 1627)   metronidazole 500 mg (12/04/21 1328)    LOS: 4 days   Roxan Hockey, MD How to contact the [facility] Attending or Consulting provider [place] or covering provider during after hours [place], for this patient?  Check the care team in [facility] and look for a) attending/consulting TRH provider listed and b) the [facility] team listed Log into [email or web address] and use [place]'s universal password to access. If you do not have the password, please contact the hospital operator. Locate the [facility] provider you are looking for under [facility] and page to a number that you can be directly reached. If you still have difficulty reaching the provider,  please page the [facility] (Director on Call) for the Hospitalists listed on amion for assistance.  12/04/2021, 5:41 PM

## 2021-12-04 NOTE — Sedation Documentation (Signed)
Procedure started, pt resting

## 2021-12-04 NOTE — Sedation Documentation (Signed)
Pt is resting, asleep, in the radiology nurses station without complaints. Awaiting carelink transport back to [facility]

## 2021-12-04 NOTE — Sedation Documentation (Signed)
Carelink here to pick up pt for transport back to [facility]. Report give to [place] and carelink. VSS.

## 2021-12-04 NOTE — Sedation Documentation (Signed)
Biopsies taken °

## 2021-12-04 NOTE — Progress Notes (Signed)
ANTICOAGULATION CONSULT NOTE - Follow Up Consult  Pharmacy Consult for Heparin Indication:  portal vein thrombosis  Allergies  Allergen Reactions   Amitriptyline Other (See Comments)    sleepy   Bextra [Valdecoxib] Other (See Comments)    unknown   Codeine Nausea And Vomiting   Cymbalta [Duloxetine Hcl] Swelling and Other (See Comments)    dizzy   Esomeprazole Magnesium Diarrhea   Niacin-Lovastatin Er Other (See Comments)    headache   Niaspan [Niacin Er] Other (See Comments)    Increased headache   Penicillins Rash    Has patient had a PCN reaction causing immediate rash, facial/tongue/throat swelling, SOB or lightheadedness with hypotension: Yes Has patient had a PCN reaction causing severe rash involving mucus membranes or skin necrosis: No Has patient had a PCN reaction that required hospitalization No Has patient had a PCN reaction occurring within the last 10 years: No Tolerated Zosyn 06/2016     Patient Measurements: Height: '5\' 11"'$  (180.3 cm) Weight: 79.4 kg (175 lb) IBW/kg (Calculated) : 75.3 Heparin Dosing Weight: 79 kg  Vital Signs: Temp: 98.1 F (36.7 C) (05/19 1258) Temp Source: Oral (05/19 1258) BP: 165/70 (05/19 1258) Pulse Rate: 66 (05/19 1258)  Labs: Recent Labs    12/02/21 0632 12/02/21 1600 12/02/21 2225 12/03/21 0524 12/03/21 1537 12/04/21 0451  HGB 8.7*  --   --  8.8*  --  8.8*  HCT 26.1*  --   --  28.4*  --  26.3*  PLT 454*  --   --  457*  --  467*  APTT >200* 124* 121*  --   --   --   LABPROT  --   --   --  17.4*  --   --   INR  --   --   --  1.4*  --   --   HEPARINUNFRC 0.62 0.34 0.28* 0.27* 0.34  --   CREATININE 1.03  --   --  0.98  --   --      Estimated Creatinine Clearance: 65.1 mL/min (by C-G formula based on SCr of 0.98 mg/dL).  Assessment: 81 y.o. male with abdominal pain of about 3 weeks duration. Hospital admission 4/9 to 4/13 for large inguinal hernia repair. Patient taking Eliquis PTA for Atrial Fibrillation (Last dose  reported 5/15 AM). Pharmacy consulted for IV heparin dosing. Will use aPTTs to monitor while heparin levels are falsely elevated due to recent Eliquis doses.  Liver biopsy completed. Restarting heparin at previous rate.  Goal of Therapy:  Heparin level 0.3-0.7 units/ml aPTT 66-102 seconds Monitor platelets by anticoagulation protocol: Yes   Plan:  Ok to restart heparin now per GI Restart heparin infusion at 1000 units/hr.  Continue to monitor H&H and platelets.  Margot Ables, PharmD Clinical Pharmacist 12/04/2021 1:46 PM

## 2021-12-04 NOTE — Sedation Documentation (Signed)
Pt resting at this time without complaints. VSS. Will continue to monitor.

## 2021-12-04 NOTE — Sedation Documentation (Signed)
Pt tolerated procedure very well. Pt has no complaints at this time. Report given to [facility] [place] nurse, awaiting carelink transport back to [facility].  Totals: Time: 7 mins Fentanyl 95mg Versed '1mg'$ 

## 2021-12-04 NOTE — Progress Notes (Signed)
[place] for Heparin Indication:  portal vein thrombosis  Allergies  Allergen Reactions   Amitriptyline Other (See Comments)    sleepy   Bextra [Valdecoxib] Other (See Comments)    unknown   Codeine Nausea And Vomiting   Cymbalta [Duloxetine Hcl] Swelling and Other (See Comments)    dizzy   Esomeprazole Magnesium Diarrhea   Niacin-Lovastatin Er Other (See Comments)    headache   Niaspan [Niacin Er] Other (See Comments)    Increased headache   Penicillins Rash    Has patient had a PCN reaction causing immediate rash, facial/tongue/throat swelling, SOB or lightheadedness with hypotension: Yes Has patient had a PCN reaction causing severe rash involving mucus membranes or skin necrosis: No Has patient had a PCN reaction that required hospitalization No Has patient had a PCN reaction occurring within the last 10 years: No Tolerated Zosyn 06/2016     Patient Measurements: Height: '5\' 11"'$  (180.3 cm) Weight: 79.4 kg (175 lb) IBW/kg (Calculated) : 75.3 Heparin Dosing Weight: 79 kg  Vital Signs: Temp: 98.3 F (36.8 C) (05/19 2121) Temp Source: Oral (05/19 2121) BP: 144/63 (05/19 2121) Pulse Rate: 87 (05/19 2121)  Labs: Recent Labs    12/02/21 7096 12/02/21 1600 12/02/21 2225 12/03/21 0524 12/03/21 1537 12/04/21 0451 12/04/21 2208  HGB 8.7*  --   --  8.8*  --  8.8*  --   HCT 26.1*  --   --  28.4*  --  26.3*  --   PLT 454*  --   --  457*  --  467*  --   APTT >200* 124* 121*  --   --   --   --   LABPROT  --   --   --  17.4*  --   --   --   INR  --   --   --  1.4*  --   --   --   HEPARINUNFRC 0.62 0.34 0.28* 0.27* 0.34  --  0.12*  CREATININE 1.03  --   --  0.98  --   --   --     Estimated Creatinine Clearance: 65.1 mL/min (by C-G formula based on SCr of 0.98 mg/dL).  Assessment: 81 y.o. male with abdominal pain of about 3 weeks duration. Hospital admission 4/9 to 4/13 for large inguinal hernia repair. Patient taking Eliquis PTA  for Atrial Fibrillation (Last dose reported 5/15 AM). Pharmacy consulted for IV heparin dosing. Will use aPTTs to monitor while heparin levels are falsely elevated due to recent Eliquis doses.  Liver biopsy completed. Restarted heparin at previous rate of 1000 units/hr. Heparin level subtherapeutic at 0.12.   Goal of Therapy:  Heparin level 0.3-0.7 units/ml aPTT 66-102 seconds Monitor platelets by anticoagulation protocol: Yes   Plan:  Increase heparin infusion to 1150 units/hr Check heparin level in 8 hours and daily while on heparin Continue to monitor H&H and platelets    Thank you for allowing pharmacy to be a part of this patient's care.  Ardyth Harps, PharmD Clinical Pharmacist

## 2021-12-04 NOTE — Procedures (Signed)
Interventional Radiology Procedure Note  Procedure: Ultrasound guided right liver mass biopsy  Findings: Please refer to procedural dictation for full description.18 ga core x3 from segment 6 mass.  Gelfoam slurry needle track embolization.  Complications: None immediate  Estimated Blood Loss: <  5 mL  Recommendations: Strict 3 hour bedrest. Follow up Pathology results.   Ruthann Cancer, MD

## 2021-12-04 NOTE — Care Management Important Message (Signed)
Important Message  Patient Details  Name: Dustin Dominguez MRN: [id number] Date of Birth: August 05, 1940   Medicare Important Message Given:  Yes     Tommy Medal 12/04/2021, 1:41 PM

## 2021-12-04 NOTE — Progress Notes (Signed)
Pt arrived back from liver biopsy at [facility]. No complaints at this time. Pt is resting comfortably with call light within reach. Also dressed right side of ABD with gauze and ABD pads due to biopsy site leaking. MD is aware, no new orders at this time.

## 2021-12-05 DIAGNOSIS — K766 Portal hypertension: Secondary | ICD-10-CM | POA: Diagnosis not present

## 2021-12-05 DIAGNOSIS — K3189 Other diseases of stomach and duodenum: Secondary | ICD-10-CM | POA: Diagnosis not present

## 2021-12-05 DIAGNOSIS — K529 Noninfective gastroenteritis and colitis, unspecified: Secondary | ICD-10-CM | POA: Diagnosis not present

## 2021-12-05 DIAGNOSIS — R1084 Generalized abdominal pain: Secondary | ICD-10-CM | POA: Diagnosis not present

## 2021-12-05 LAB — CBC
HCT: 29.2 % — ABNORMAL LOW (ref 39.0–52.0)
Hemoglobin: 9.9 g/dL — ABNORMAL LOW (ref 13.0–17.0)
MCH: 30.1 pg (ref 26.0–34.0)
MCHC: 33.9 g/dL (ref 30.0–36.0)
MCV: 88.8 fL (ref 80.0–100.0)
Platelets: 467 10*3/uL — ABNORMAL HIGH (ref 150–400)
RBC: 3.29 MIL/uL — ABNORMAL LOW (ref 4.22–5.81)
RDW: 19.8 % — ABNORMAL HIGH (ref 11.5–15.5)
WBC: 23.1 10*3/uL — ABNORMAL HIGH (ref 4.0–10.5)
nRBC: 0.1 % (ref 0.0–0.2)

## 2021-12-05 LAB — HEPARIN LEVEL (UNFRACTIONATED): Heparin Unfractionated: 0.31 IU/mL (ref 0.30–0.70)

## 2021-12-05 MED ORDER — LACTULOSE 10 GM/15ML PO SOLN: 10.0000 g | Freq: Every day | ORAL | 2 refills | Status: AC

## 2021-12-05 MED ORDER — ONDANSETRON HCL 4 MG PO TABS
4.0000 mg | ORAL_TABLET | Freq: Four times a day (QID) | ORAL | 0 refills | Status: AC | PRN
Start: 2021-12-05 — End: ?

## 2021-12-05 MED ORDER — AMLODIPINE BESYLATE 10 MG PO TABS: 10.0000 mg | ORAL_TABLET | Freq: Every day | ORAL | 3 refills | Status: AC

## 2021-12-05 MED ORDER — APIXABAN 5 MG PO TABS
5.0000 mg | ORAL_TABLET | Freq: Two times a day (BID) | ORAL | Status: DC
  Administered 2021-12-05: 5 mg via ORAL
  Filled 2021-12-05 (×2): qty 1

## 2021-12-05 MED ORDER — ALBUTEROL SULFATE HFA 108 (90 BASE) MCG/ACT IN AERS: 2.0000 | INHALATION_SPRAY | RESPIRATORY_TRACT | 0 refills | Status: AC | PRN

## 2021-12-05 MED ORDER — APIXABAN 5 MG PO TABS: 5.0000 mg | ORAL_TABLET | Freq: Two times a day (BID) | ORAL | 5 refills | Status: AC

## 2021-12-05 MED ORDER — ELIQUIS DVT/PE STARTER PACK 5 MG PO TBPK: ORAL_TABLET | ORAL | 0 refills | Status: AC

## 2021-12-05 NOTE — Progress Notes (Signed)
[place] for Heparin Indication:  portal vein thrombosis  Allergies  Allergen Reactions   Amitriptyline Other (See Comments)    sleepy   Bextra [Valdecoxib] Other (See Comments)    unknown   Codeine Nausea And Vomiting   Cymbalta [Duloxetine Hcl] Swelling and Other (See Comments)    dizzy   Esomeprazole Magnesium Diarrhea   Niacin-Lovastatin Er Other (See Comments)    headache   Niaspan [Niacin Er] Other (See Comments)    Increased headache   Penicillins Rash    Has patient had a PCN reaction causing immediate rash, facial/tongue/throat swelling, SOB or lightheadedness with hypotension: Yes Has patient had a PCN reaction causing severe rash involving mucus membranes or skin necrosis: No Has patient had a PCN reaction that required hospitalization No Has patient had a PCN reaction occurring within the last 10 years: No Tolerated Zosyn 06/2016     Patient Measurements: Height: '5\' 11"'$  (180.3 cm) Weight: 79.4 kg (175 lb) IBW/kg (Calculated) : 75.3 Heparin Dosing Weight: 79 kg  Vital Signs: Temp: 98.3 F (36.8 C) (05/20 0441) Temp Source: Oral (05/20 0441) BP: 144/68 (05/20 0441) Pulse Rate: 79 (05/20 0441)  Labs: Recent Labs    12/02/21 1600 12/02/21 2225 12/02/21 2225 12/03/21 0524 12/03/21 1537 12/04/21 0451 12/04/21 2208 12/05/21 0748  HGB  --   --    < > 8.8*  --  8.8*  --  9.9*  HCT  --   --   --  28.4*  --  26.3*  --  29.2*  PLT  --   --   --  457*  --  467*  --  467*  APTT 124* 121*  --   --   --   --   --   --   LABPROT  --   --   --  17.4*  --   --   --   --   INR  --   --   --  1.4*  --   --   --   --   HEPARINUNFRC 0.34 0.28*  --  0.27* 0.34  --  0.12* 0.31  CREATININE  --   --   --  0.98  --   --   --   --    < > = values in this interval not displayed.     Estimated Creatinine Clearance: 65.1 mL/min (by C-G formula based on SCr of 0.98 mg/dL).  Assessment: 81 y.o. male with abdominal pain of about 3  weeks duration. Hospital admission 4/9 to 4/13 for large inguinal hernia repair. Patient taking Eliquis PTA for Atrial Fibrillation (Last dose reported 5/15 AM). Pharmacy consulted for IV heparin dosing.   Liver biopsy completed. Restarted heparin at previous rate of 1000 units/hr. Heparin level therapeutic at 0.31 today  Goal of Therapy:  Heparin level 0.3-0.7 units/ml aPTT 66-102 seconds Monitor platelets by anticoagulation protocol: Yes   Plan:  Continue heparin infusion at 1150 units/hr Recheck heparin level in 8 hours to confirm therapeutic and stable.  Heparin level daily  Continue to monitor H&H and platelets, s/sx bleeding    Thank you for allowing pharmacy to be a part of this patient's care.  Hart Robinsons, PharmD Clinical Pharmacist

## 2021-12-05 NOTE — Discharge Instructions (Signed)
1)Take Eliquis/Apixaban 10 mg (2 tablets of the 5 mg pill) twice a day for 7 days and then take 5 mg (1 tablet) twice daily indefinitely after that 2)Avoid ibuprofen/Advil/Aleve/Motrin/Goody Powders/Naproxen/BC powders/Meloxicam/Diclofenac/Indomethacin and other Nonsteroidal anti-inflammatory medications as these will make you more likely to bleed and can cause stomach ulcers, can also cause Kidney problems.  3) hospice RN/liaison will call you next week to discuss possible enrollment into hospice services, please call [phone] if you do not get a call from hospice by Tuesday, 12/08/2021 4) you can also call [phone] on Monday, 12/07/2021 to get a report of your liver biopsy results 5) the hospice team and your pain management doctors can help adjust the pain medications as needed
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
## 2021-12-05 NOTE — Progress Notes (Signed)
Ng Discharge Note  Admit Date:  11/30/2021 Discharge date: 12/05/2021   Dustin Dominguez to be D/C'd Home per MD order.  AVS completed. Patient/caregiver able to verbalize understanding.  Discharge Medication: Allergies as of 12/05/2021       Reactions   Amitriptyline Other (See Comments)   sleepy   Bextra [valdecoxib] Other (See Comments)   unknown   Codeine Nausea And Vomiting   Cymbalta [duloxetine Hcl] Swelling, Other (See Comments)   dizzy   Esomeprazole Magnesium Diarrhea   Niacin-lovastatin Er Other (See Comments)   headache   Niaspan [niacin Er] Other (See Comments)   Increased headache   Penicillins Rash   Has patient had a PCN reaction causing immediate rash, facial/tongue/throat swelling, SOB or lightheadedness with hypotension: Yes Has patient had a PCN reaction causing severe rash involving mucus membranes or skin necrosis: No Has patient had a PCN reaction that required hospitalization No Has patient had a PCN reaction occurring within the last 10 years: No Tolerated Zosyn 06/2016        Medication List     STOP taking these medications    atorvastatin 10 MG tablet Commonly known as: LIPITOR   Eliquis 5 MG Tabs tablet Generic drug: apixaban Replaced by: Eliquis DVT/PE Starter Pack   esomeprazole 40 MG capsule Commonly known as: NexIUM   fenofibrate 160 MG tablet   furosemide 20 MG tablet Commonly known as: LASIX   lisinopril 5 MG tablet Commonly known as: ZESTRIL   potassium chloride 10 MEQ tablet Commonly known as: KLOR-CON M   sucralfate 1 GM/10ML suspension Commonly known as: CARAFATE   vitamin B-12 1000 MCG tablet Commonly known as: CYANOCOBALAMIN       TAKE these medications    albuterol 108 (90 Base) MCG/ACT inhaler Commonly known as: Ventolin HFA Inhale 2 puffs into the lungs every 4 (four) hours as needed for wheezing or shortness of breath. What changed: See the new instructions.   amLODipine 10 MG tablet Commonly known  as: NORVASC Take 1 tablet (10 mg total) by mouth daily. For BP What changed:  medication strength how much to take additional instructions   Eliquis DVT/PE Starter Pack Generic drug: Apixaban Starter Pack ('10mg'$  and '5mg'$ ) Take as directed on package: start with two-'5mg'$  tablets twice daily for 7 days. On day 8, switch to one-'5mg'$  tablet twice daily. Replaces: Eliquis 5 MG Tabs tablet   apixaban 5 MG Tabs tablet Commonly known as: ELIQUIS Take 1 tablet (5 mg total) by mouth 2 (two) times daily. Start after completing the initial starter pack Start taking on: January 03, 2022   isosorbide mononitrate 30 MG 24 hr tablet Commonly known as: IMDUR TAKE 1 TABLET BY MOUTH DAILY   lactulose 10 GM/15ML solution Commonly known as: Constulose Take 15 mLs (10 g total) by mouth daily. TAKE ONE TABLESPOONFUL (15ML) BY MOUTH DAILY What changed:  when to take this additional instructions   methocarbamol 500 MG tablet Commonly known as: Robaxin Take 1 tablet (500 mg total) by mouth every 8 (eight) hours as needed for muscle spasms.   metoprolol tartrate 25 MG tablet Commonly known as: LOPRESSOR TAKE 1 TABLET BY MOUTH TWICE DAILY   morphine 30 MG 12 hr tablet Commonly known as: MS CONTIN Take 30 mg by mouth 3 (three) times daily.   omeprazole 40 MG capsule Commonly known as: PRILOSEC Take 1 capsule (40 mg total) by mouth 2 (two) times daily.   ondansetron 4 MG tablet Commonly known as: ZOFRAN Take  1 tablet (4 mg total) by mouth every 6 (six) hours as needed for nausea.   oxyCODONE-acetaminophen 10-325 MG tablet Commonly known as: PERCOCET Take 1 tablet by mouth 6 (six) times daily.               Durable Medical Equipment  (From admission, onward)           Start     Ordered   12/04/21 1445  For home use only DME 4 wheeled rolling walker with seat  Once       Question:  Patient needs a walker to treat with the following condition  Answer:  Metastatic cancer ([place])   12/04/21  1444   12/04/21 1443  For home use only DME standard manual wheelchair with seat cushion  Once       Comments: Patient suffers from metastatic cancer which impairs their ability to perform daily activities like bathing, dressing, grooming, and toileting in the home.  A cane, crutch, or walker will not resolve issue with performing activities of daily living. A wheelchair will allow patient to safely perform daily activities. Patient can safely propel the wheelchair in the home or has a caregiver who can provide assistance. Length of need Lifetime. Accessories: elevating leg rests (ELRs), wheel locks, extensions and anti-tippers.   12/04/21 1443              Discharge Care Instructions  (From admission, onward)           Start     Ordered   12/05/21 0000  Discharge wound care:       Comments: Keep clean and dry   12/05/21 1227            Discharge Assessment: Vitals:   12/04/21 2121 12/05/21 0441  BP: (!) 144/63 (!) 144/68  Pulse: 87 79  Resp: 19 20  Temp: 98.3 F (36.8 C) 98.3 F (36.8 C)  SpO2: 95% 94%   Skin clean, dry and intact without evidence of skin break down, no evidence of skin tears noted. IV catheter discontinued intact. Site without signs and symptoms of complications - no redness or edema noted at insertion site, patient denies c/o pain - only slight tenderness at site.  Dressing with slight pressure applied.  D/c Instructions-Education: Discharge instructions given to patient/family with verbalized understanding. D/c education completed with patient/family including follow up instructions, medication list, d/c activities limitations if indicated, with other d/c instructions as indicated by MD - patient able to verbalize understanding, all questions fully answered. Patient instructed to return to ED, call 911, or call MD for any changes in condition.  Patient escorted via [place], and D/C home via private auto.  Tsosie Billing, LPN 7/62/8315 1:76 PM

## 2021-12-05 NOTE — Progress Notes (Signed)
Subjective:  Patient has no complaints.  He says postprandial pain was not as bad as it was earlier.  He denies nausea vomiting melena or rectal bleeding.  Current Medications:  Current Facility-Administered Medications:    0.9 %  sodium chloride infusion, , Intravenous, PRN, [place] Brick, Courage, MD, Last Rate: 10 mL/hr at 12/02/21 0943, New Bag at 12/02/21 0943   acetaminophen (TYLENOL) tablet 650 mg, 650 mg, Oral, Q6H PRN, 650 mg at 12/01/21 0846 **OR** acetaminophen (TYLENOL) suppository 650 mg, 650 mg, Rectal, Q6H PRN, Emokpae, Ejiroghene E, MD   albuterol (PROVENTIL) (2.5 MG/3ML) 0.083% nebulizer solution 3 mL, 3 mL, Inhalation, Q6H PRN, Emokpae, Ejiroghene E, MD   apixaban (ELIQUIS) tablet 5 mg, 5 mg, Oral, BID, Emokpae, Courage, MD   calcium carbonate (TUMS - dosed in mg elemental calcium) chewable tablet 400 mg of elemental calcium, 400 mg of elemental calcium, Oral, BID WC, Emokpae, Courage, MD, 400 mg of elemental calcium at 12/05/21 0813   cefTRIAXone (ROCEPHIN) 2 g in sodium chloride 0.9 % 100 mL IVPB, 2 g, Intravenous, Q24H, Emokpae, Ejiroghene E, MD, Last Rate: 200 mL/hr at 12/05/21 0820, 2 g at 12/05/21 0820   feeding supplement (BOOST / RESOURCE BREEZE) liquid 1 Container, 1 Container, Oral, TID BM, Emokpae, Ejiroghene E, MD, 1 Container at 12/04/21 2007   heparin ADULT infusion 100 units/mL (25000 units/269mL), 1,150 Units/hr, Intravenous, Continuous, Hall, Scott A, RPH, Last Rate: 11.5 mL/hr at 12/05/21 0804, 1,150 Units/hr at 12/05/21 0804   HYDROmorphone (DILAUDID) injection 1 mg, 1 mg, Intravenous, Q4H PRN, Emokpae, Ejiroghene E, MD, 1 mg at 12/05/21 0450   isosorbide mononitrate (IMDUR) 24 hr tablet 30 mg, 30 mg, Oral, Daily, Emokpae, Ejiroghene E, MD, 30 mg at 12/05/21 9509   metoprolol tartrate (LOPRESSOR) tablet 25 mg, 25 mg, Oral, BID, Emokpae, Ejiroghene E, MD, 25 mg at 12/05/21 3267   metroNIDAZOLE (FLAGYL) IVPB 500 mg, 500 mg, Intravenous, Q12H, Emokpae, Ejiroghene E,  MD, Last Rate: 100 mL/hr at 12/05/21 1137, 500 mg at 12/05/21 1137   morphine (MS CONTIN) 12 hr tablet 30 mg, 30 mg, Oral, Q12H, Emokpae, Courage, MD, 30 mg at 12/05/21 0954   ondansetron (ZOFRAN) tablet 4 mg, 4 mg, Oral, Q6H PRN **OR** ondansetron (ZOFRAN) injection 4 mg, 4 mg, Intravenous, Q6H PRN, Emokpae, Ejiroghene E, MD   oxyCODONE (Oxy IR/ROXICODONE) immediate release tablet 10 mg, 10 mg, Oral, Q4H PRN, Emokpae, Courage, MD, 10 mg at 12/04/21 0604   pantoprazole (PROTONIX) injection 40 mg, 40 mg, Intravenous, Q12H, Emokpae, Ejiroghene E, MD, 40 mg at 12/05/21 0813   polyethylene glycol (MIRALAX / GLYCOLAX) packet 17 g, 17 g, Oral, Daily PRN, Emokpae, Ejiroghene E, MD   Objective: Blood pressure (!) 144/68, pulse 79, temperature 98.3 F (36.8 C), temperature source Oral, resp. rate 20, height $RemoveBe'5\' 11"'UAgfIRzVp$  (1.803 m), weight 79.4 kg, SpO2 94 %. Patient is alert and in no acute distress. Dressing over liver biopsy site is dry. Abdominal fullness in right upper quadrant as before.  He has mild tenderness in this region.  No definite mass palpable.  Labs/studies Results:      Latest Ref Rng & Units 12/05/2021    7:48 AM 12/04/2021    4:51 AM 12/03/2021    5:24 AM  CBC  WBC 4.0 - 10.5 K/uL 23.1   22.6   19.7    Hemoglobin 13.0 - 17.0 g/dL 9.9   8.8   8.8    Hematocrit 39.0 - 52.0 % 29.2   26.3   28.4  Platelets 150 - 400 K/uL 467   467   457         Latest Ref Rng & Units 12/03/2021    3:37 PM 12/03/2021    5:24 AM 12/02/2021    6:32 AM  CMP  Glucose 70 - 99 mg/dL  122   121    BUN 8 - 23 mg/dL  24   24    Creatinine 0.61 - 1.24 mg/dL  0.98   1.03    Sodium 135 - 145 mmol/L  140   139    Potassium 3.5 - 5.1 mmol/L  3.5   3.7    Chloride 98 - 111 mmol/L  112   113    CO2 22 - 32 mmol/L  22   23    Calcium 8.9 - 10.3 mg/dL  6.6   6.4    Total Protein 6.5 - 8.1 g/dL 5.9      Total Bilirubin 0.3 - 1.2 mg/dL 0.6      Alkaline Phos 38 - 126 U/L 327      AST 15 - 41 U/L 60      ALT 0 -  44 U/L 21           Latest Ref Rng & Units 12/03/2021    3:37 PM 12/03/2021    5:24 AM 12/01/2021    8:00 AM  Hepatic Function  Total Protein 6.5 - 8.1 g/dL 5.9    5.8    Albumin 3.5 - 5.0 g/dL 1.7   1.6   1.8    AST 15 - 41 U/L 60    68    ALT 0 - 44 U/L 21    19    Alk Phosphatase 38 - 126 U/L 327    287    Total Bilirubin 0.3 - 1.2 mg/dL 0.6    1.2    Bilirubin, Direct 0.0 - 0.2 mg/dL 0.3    0.6        Assessment:  #1.  Portal vein thrombosis.  Thrombosis felt to be acute given his presentation.  Pain has decreased with anticoagulant.  He will be switched to oral anticoagulant at the time of discharge.  #2.  Multiple liver lesions concerning for malignant process either primary or metastatic disease.  Alpha-fetoprotein is only mildly elevated and felt to be insignificant.  Patient underwent ultrasound-guided biopsy yesterday.  He is not having any issues.  #3.  New diagnosis of cirrhosis.  #4.  Gastric and colonic wall thickening most likely due to hypoalbuminemia and portal hypertension.  He has no symptoms suggest peptic ulcer disease or colitis.  Recommendations  Agree with discharge planning. I will reach out to patient's daughter [name] with biopsy results next week.

## 2021-12-07 LAB — PROTHROMBIN GENE MUTATION

## 2021-12-08 ENCOUNTER — Telehealth (INDEPENDENT_AMBULATORY_CARE_PROVIDER_SITE_OTHER): Payer: Self-pay | Admitting: Internal Medicine

## 2021-12-08 NOTE — Telephone Encounter (Signed)
I called patient's daughter to let her know biopsy results not final yet. She indicated her dad is SOB. She will bring him to [facility] for evaluation later today.

## 2021-12-09 ENCOUNTER — Other Ambulatory Visit: Payer: Medicare HMO

## 2021-12-10 LAB — SURGICAL PATHOLOGY

## 2022-01-16 DEATH — deceased

## 2022-02-01 ENCOUNTER — Ambulatory Visit (INDEPENDENT_AMBULATORY_CARE_PROVIDER_SITE_OTHER): Payer: Medicare HMO | Admitting: Gastroenterology

## 2022-02-02 ENCOUNTER — Ambulatory Visit: Payer: Medicare HMO | Admitting: Cardiology

## 2022-02-09 ENCOUNTER — Ambulatory Visit: Payer: Medicare HMO | Admitting: Family Medicine
